# Patient Record
Sex: Male | Born: 1950 | Race: Black or African American | Hispanic: No | Marital: Married | Smoking: Current some day smoker
Health system: Southern US, Community
[De-identification: ages and names within clinical notes are randomized; demographics above are authoritative.]

## PROBLEM LIST (undated history)

## (undated) DIAGNOSIS — N281 Cyst of kidney, acquired: Secondary | ICD-10-CM

## (undated) DIAGNOSIS — Z972 Presence of dental prosthetic device (complete) (partial): Secondary | ICD-10-CM

## (undated) DIAGNOSIS — Z860101 Personal history of adenomatous and serrated colon polyps: Secondary | ICD-10-CM

## (undated) DIAGNOSIS — I10 Essential (primary) hypertension: Secondary | ICD-10-CM

## (undated) DIAGNOSIS — J45909 Unspecified asthma, uncomplicated: Secondary | ICD-10-CM

## (undated) DIAGNOSIS — K629 Disease of anus and rectum, unspecified: Secondary | ICD-10-CM

## (undated) DIAGNOSIS — F1410 Cocaine abuse, uncomplicated: Secondary | ICD-10-CM

## (undated) DIAGNOSIS — N529 Male erectile dysfunction, unspecified: Secondary | ICD-10-CM

## (undated) DIAGNOSIS — N183 Chronic kidney disease, stage 3 unspecified: Secondary | ICD-10-CM

## (undated) DIAGNOSIS — J449 Chronic obstructive pulmonary disease, unspecified: Secondary | ICD-10-CM

## (undated) DIAGNOSIS — J309 Allergic rhinitis, unspecified: Secondary | ICD-10-CM

## (undated) DIAGNOSIS — R7303 Prediabetes: Secondary | ICD-10-CM

## (undated) DIAGNOSIS — I739 Peripheral vascular disease, unspecified: Secondary | ICD-10-CM

## (undated) DIAGNOSIS — R0609 Other forms of dyspnea: Secondary | ICD-10-CM

## (undated) DIAGNOSIS — I6523 Occlusion and stenosis of bilateral carotid arteries: Secondary | ICD-10-CM

## (undated) DIAGNOSIS — Z973 Presence of spectacles and contact lenses: Secondary | ICD-10-CM

## (undated) DIAGNOSIS — K219 Gastro-esophageal reflux disease without esophagitis: Secondary | ICD-10-CM

## (undated) DIAGNOSIS — N401 Enlarged prostate with lower urinary tract symptoms: Secondary | ICD-10-CM

## (undated) DIAGNOSIS — I700 Atherosclerosis of aorta: Secondary | ICD-10-CM

## (undated) DIAGNOSIS — Z86010 Personal history of colonic polyps: Secondary | ICD-10-CM

## (undated) HISTORY — PX: NO PAST SURGERIES: SHX2092

## (undated) HISTORY — DX: Cocaine abuse, uncomplicated: F14.10

## (undated) HISTORY — DX: Occlusion and stenosis of bilateral carotid arteries: I65.23

---

## 2002-05-19 ENCOUNTER — Emergency Department (HOSPITAL_COMMUNITY): Admission: EM | Admit: 2002-05-19 | Discharge: 2002-05-19 | Payer: Self-pay | Admitting: *Deleted

## 2004-12-27 ENCOUNTER — Ambulatory Visit: Payer: Self-pay | Admitting: Internal Medicine

## 2004-12-27 ENCOUNTER — Ambulatory Visit: Payer: Self-pay | Admitting: *Deleted

## 2005-01-04 ENCOUNTER — Ambulatory Visit (HOSPITAL_COMMUNITY): Admission: RE | Admit: 2005-01-04 | Discharge: 2005-01-04 | Payer: Self-pay | Admitting: Internal Medicine

## 2005-01-19 ENCOUNTER — Ambulatory Visit: Payer: Self-pay | Admitting: Internal Medicine

## 2005-02-15 ENCOUNTER — Ambulatory Visit: Payer: Self-pay | Admitting: Cardiology

## 2005-02-27 ENCOUNTER — Ambulatory Visit: Payer: Self-pay

## 2005-03-12 ENCOUNTER — Ambulatory Visit: Payer: Self-pay | Admitting: Cardiology

## 2005-03-21 ENCOUNTER — Ambulatory Visit: Payer: Self-pay | Admitting: Internal Medicine

## 2008-01-30 ENCOUNTER — Ambulatory Visit: Payer: Self-pay | Admitting: Internal Medicine

## 2008-01-30 DIAGNOSIS — L259 Unspecified contact dermatitis, unspecified cause: Secondary | ICD-10-CM

## 2008-01-30 DIAGNOSIS — E785 Hyperlipidemia, unspecified: Secondary | ICD-10-CM | POA: Insufficient documentation

## 2008-01-30 DIAGNOSIS — L739 Follicular disorder, unspecified: Secondary | ICD-10-CM

## 2008-01-30 DIAGNOSIS — F1410 Cocaine abuse, uncomplicated: Secondary | ICD-10-CM

## 2008-01-30 DIAGNOSIS — I739 Peripheral vascular disease, unspecified: Secondary | ICD-10-CM | POA: Insufficient documentation

## 2008-01-30 DIAGNOSIS — I10 Essential (primary) hypertension: Secondary | ICD-10-CM | POA: Insufficient documentation

## 2008-01-30 DIAGNOSIS — J45909 Unspecified asthma, uncomplicated: Secondary | ICD-10-CM | POA: Insufficient documentation

## 2008-01-30 HISTORY — DX: Cocaine abuse, uncomplicated: F14.10

## 2008-01-30 LAB — CONVERTED CEMR LAB
BUN: 10 mg/dL (ref 6–23)
CO2: 23 meq/L (ref 19–32)
Calcium: 9.5 mg/dL (ref 8.4–10.5)
Chloride: 106 meq/L (ref 96–112)
Creatinine, Ser: 1.23 mg/dL (ref 0.40–1.50)
Glucose, Bld: 84 mg/dL (ref 70–99)
Potassium: 4.1 meq/L (ref 3.5–5.3)
Sodium: 140 meq/L (ref 135–145)

## 2008-02-18 ENCOUNTER — Ambulatory Visit: Payer: Self-pay | Admitting: Internal Medicine

## 2008-02-29 ENCOUNTER — Encounter (INDEPENDENT_AMBULATORY_CARE_PROVIDER_SITE_OTHER): Payer: Self-pay | Admitting: Internal Medicine

## 2008-02-29 LAB — CONVERTED CEMR LAB
ALT: 25 units/L (ref 0–53)
AST: 22 units/L (ref 0–37)
Albumin: 4.6 g/dL (ref 3.5–5.2)
Alkaline Phosphatase: 143 units/L — ABNORMAL HIGH (ref 39–117)
BUN: 8 mg/dL (ref 6–23)
CO2: 28 meq/L (ref 19–32)
Calcium: 9.5 mg/dL (ref 8.4–10.5)
Chloride: 104 meq/L (ref 96–112)
Cholesterol: 233 mg/dL — ABNORMAL HIGH (ref 0–200)
Creatinine, Ser: 1.29 mg/dL (ref 0.40–1.50)
Glucose, Bld: 87 mg/dL (ref 70–99)
HDL: 45 mg/dL (ref >39)
LDL Cholesterol: 159 mg/dL — ABNORMAL HIGH (ref 0–99)
Potassium: 3.8 meq/L (ref 3.5–5.3)
Sodium: 142 meq/L (ref 135–145)
Total Bilirubin: 0.5 mg/dL (ref 0.3–1.2)
Total CHOL/HDL Ratio: 5.2
Total Protein: 7.9 g/dL (ref 6.0–8.3)
Triglycerides: 143 mg/dL (ref <150)
VLDL: 29 mg/dL (ref 0–40)

## 2008-03-04 ENCOUNTER — Ambulatory Visit: Payer: Self-pay | Admitting: Internal Medicine

## 2008-03-04 DIAGNOSIS — K219 Gastro-esophageal reflux disease without esophagitis: Secondary | ICD-10-CM | POA: Insufficient documentation

## 2008-04-29 ENCOUNTER — Ambulatory Visit: Payer: Self-pay | Admitting: Internal Medicine

## 2008-04-29 DIAGNOSIS — L255 Unspecified contact dermatitis due to plants, except food: Secondary | ICD-10-CM

## 2008-04-29 LAB — CONVERTED CEMR LAB
ALT: 17 units/L (ref 0–53)
AST: 18 units/L (ref 0–37)
Albumin: 4 g/dL (ref 3.5–5.2)
Alkaline Phosphatase: 112 units/L (ref 39–117)
BUN: 10 mg/dL (ref 6–23)
CO2: 27 meq/L (ref 19–32)
Calcium: 9.2 mg/dL (ref 8.4–10.5)
Chloride: 105 meq/L (ref 96–112)
Cholesterol: 186 mg/dL (ref 0–200)
Creatinine, Ser: 1.33 mg/dL (ref 0.40–1.50)
Glucose, Bld: 85 mg/dL (ref 70–99)
HDL: 57 mg/dL (ref >39)
LDL Cholesterol: 109 mg/dL — ABNORMAL HIGH (ref 0–99)
Potassium: 4.1 meq/L (ref 3.5–5.3)
Sodium: 143 meq/L (ref 135–145)
Total Bilirubin: 0.5 mg/dL (ref 0.3–1.2)
Total CHOL/HDL Ratio: 3.3
Total Protein: 6.7 g/dL (ref 6.0–8.3)
Triglycerides: 101 mg/dL (ref <150)
VLDL: 20 mg/dL (ref 0–40)

## 2008-04-30 ENCOUNTER — Encounter (INDEPENDENT_AMBULATORY_CARE_PROVIDER_SITE_OTHER): Payer: Self-pay | Admitting: Internal Medicine

## 2008-06-01 ENCOUNTER — Ambulatory Visit: Payer: Self-pay | Admitting: Internal Medicine

## 2008-06-01 DIAGNOSIS — K029 Dental caries, unspecified: Secondary | ICD-10-CM | POA: Insufficient documentation

## 2008-06-01 DIAGNOSIS — J309 Allergic rhinitis, unspecified: Secondary | ICD-10-CM | POA: Insufficient documentation

## 2008-06-09 ENCOUNTER — Encounter (INDEPENDENT_AMBULATORY_CARE_PROVIDER_SITE_OTHER): Payer: Self-pay | Admitting: Internal Medicine

## 2008-07-02 ENCOUNTER — Ambulatory Visit: Payer: Self-pay | Admitting: Internal Medicine

## 2008-07-08 ENCOUNTER — Encounter (INDEPENDENT_AMBULATORY_CARE_PROVIDER_SITE_OTHER): Payer: Self-pay | Admitting: *Deleted

## 2008-07-08 LAB — CONVERTED CEMR LAB
Cholesterol: 187 mg/dL (ref 0–200)
HDL: 54 mg/dL (ref >39)
LDL Cholesterol: 108 mg/dL — ABNORMAL HIGH (ref 0–99)
Total CHOL/HDL Ratio: 3.5
Triglycerides: 125 mg/dL (ref <150)
VLDL: 25 mg/dL (ref 0–40)

## 2008-11-11 ENCOUNTER — Telehealth (INDEPENDENT_AMBULATORY_CARE_PROVIDER_SITE_OTHER): Payer: Self-pay | Admitting: Internal Medicine

## 2008-11-18 ENCOUNTER — Ambulatory Visit: Payer: Self-pay | Admitting: Internal Medicine

## 2008-12-05 LAB — CONVERTED CEMR LAB
Cholesterol: 226 mg/dL — ABNORMAL HIGH (ref 0–200)
HDL: 52 mg/dL (ref >39)
LDL Cholesterol: 149 mg/dL — ABNORMAL HIGH (ref 0–99)
Total CHOL/HDL Ratio: 4.3
Triglycerides: 125 mg/dL (ref <150)
VLDL: 25 mg/dL (ref 0–40)

## 2008-12-10 ENCOUNTER — Encounter (INDEPENDENT_AMBULATORY_CARE_PROVIDER_SITE_OTHER): Payer: Self-pay | Admitting: *Deleted

## 2009-03-15 ENCOUNTER — Telehealth (INDEPENDENT_AMBULATORY_CARE_PROVIDER_SITE_OTHER): Payer: Self-pay | Admitting: Internal Medicine

## 2012-02-12 ENCOUNTER — Ambulatory Visit (INDEPENDENT_AMBULATORY_CARE_PROVIDER_SITE_OTHER): Payer: 59 | Admitting: Family Medicine

## 2012-02-12 VITALS — BP 176/78 | HR 66 | Temp 98.1°F | Resp 16 | Ht 73.58 in | Wt 191.4 lb

## 2012-02-12 DIAGNOSIS — E639 Nutritional deficiency, unspecified: Secondary | ICD-10-CM

## 2012-02-12 DIAGNOSIS — R030 Elevated blood-pressure reading, without diagnosis of hypertension: Secondary | ICD-10-CM

## 2012-02-12 DIAGNOSIS — L309 Dermatitis, unspecified: Secondary | ICD-10-CM

## 2012-02-12 MED ORDER — TRIAMCINOLONE ACETONIDE 0.1 % EX CREA: TOPICAL_CREAM | Freq: Two times a day (BID) | CUTANEOUS | Status: AC

## 2012-02-12 MED ORDER — PREDNISONE 20 MG PO TABS: ORAL_TABLET | ORAL | Status: AC

## 2012-02-12 NOTE — Progress Notes (Signed)
  Patient Name: Andrew [name] Date of Birth: November 01, 1951 Medical Record Number: [id number] Gender: male Date of Encounter: 02/12/2012  History of Present Illness:  Andrew [name] is a 61 y.o. very pleasant male patient who presents with the following:  Here to evaluate a rash that has been present for years- seems to get worse in the winter. He has had it for a few months so far this year.  Otherwise he feels fine.  He has been treated with a cream topically in the past- it has helped some He is not aware of a history of HTN and has not been treated as far as he knows.  His wife does have a BP cuff at home that he can use Patient Active Problem List  Diagnoses  . HYPERCHOLESTEROLEMIA  . COCAINE ABUSE  . HYPERTENSION  . PERIPHERAL VASCULAR DISEASE  . ALLERGIC RHINITIS  . DENTAL CARIES  . GERD  . CONTACT DERMATITIS DUE TO POISON IVY  . ECZEMA  . FOLLICULITIS   No past medical history on file. No past surgical history on file. History  Substance Use Topics  . Smoking status: Never Smoker   . Smokeless tobacco: Not on file  . Alcohol Use: Not on file   No family history on file. No Known Allergies  Medication list has been reviewed and updated.  Review of Systems: As per HPI- otherwise negative. Rash is itchy, otherwise he feels fine  Physical Examination: Filed Vitals:   02/12/12 1346  BP: 176/78  Pulse: 66  Temp: 98.1 F (36.7 C)  TempSrc: Oral  Resp: 16  Height: 6' 1.58" (1.869 m)  Weight: 191 lb 6.4 oz (86.818 kg)    Body mass index is 24.86 kg/(m^2).  GEN: WDWN, NAD, Non-toxic, A & O x 3 HEENT: Atraumatic, Normocephalic. Neck supple. No masses, No LAD. Ears and Nose: No external deformity. CV: RRR, No M/G/R. No JVD. No thrill. No extra heart sounds. PULM: CTA B, no wheezes, crackles, rhonchi. No retractions. No resp. distress. No accessory muscle use. Excoriated rash especially on bilateral upper arms, upper back and lower legs.  Scaly, appears  consistent with contact derm or severe eczema EXTR: No c/c/e NEURO Normal gait.  PSYCH: Normally interactive. Conversant. Not depressed or anxious appearing.  Calm demeanor.    Assessment and Plan: 1. Eczema  triamcinolone cream (KENALOG) 0.1 %, predniSONE (DELTASONE) 20 MG tablet  2. Elevated BP     Treat eczema as above- cream BID as needed, and brief course of prednisone given severity of rash currently.  Asked him to keep an eye on his BP at home and if consistently >140/90 please let us know

## 2012-04-09 ENCOUNTER — Ambulatory Visit: Payer: Self-pay

## 2015-02-13 ENCOUNTER — Emergency Department (HOSPITAL_COMMUNITY): Payer: Self-pay

## 2015-02-13 ENCOUNTER — Encounter (HOSPITAL_COMMUNITY): Payer: Self-pay

## 2015-02-13 ENCOUNTER — Emergency Department (HOSPITAL_COMMUNITY)
Admission: EM | Admit: 2015-02-13 | Discharge: 2015-02-13 | Disposition: A | Discharge status: Home / Self Care | Payer: Self-pay | Attending: Emergency Medicine | Admitting: Emergency Medicine

## 2015-02-13 DIAGNOSIS — Z720 Tobacco use: Secondary | ICD-10-CM | POA: Insufficient documentation

## 2015-02-13 DIAGNOSIS — I10 Essential (primary) hypertension: Secondary | ICD-10-CM | POA: Insufficient documentation

## 2015-02-13 DIAGNOSIS — J441 Chronic obstructive pulmonary disease with (acute) exacerbation: Secondary | ICD-10-CM | POA: Insufficient documentation

## 2015-02-13 HISTORY — DX: Essential (primary) hypertension: I10

## 2015-02-13 MED ORDER — PREDNISONE 20 MG PO TABS
60.0000 mg | ORAL_TABLET | Freq: Once | ORAL | Status: AC
  Administered 2015-02-13: 60 mg via ORAL
  Filled 2015-02-13: qty 3

## 2015-02-13 MED ORDER — PREDNISONE 20 MG PO TABS: ORAL_TABLET | ORAL | Status: DC

## 2015-02-13 MED ORDER — IPRATROPIUM-ALBUTEROL 0.5-2.5 (3) MG/3ML IN SOLN
3.0000 mL | Freq: Once | RESPIRATORY_TRACT | Status: AC
  Administered 2015-02-13: 3 mL via RESPIRATORY_TRACT
  Filled 2015-02-13: qty 3

## 2015-02-13 MED ORDER — ALBUTEROL SULFATE HFA 108 (90 BASE) MCG/ACT IN AERS
2.0000 | INHALATION_SPRAY | RESPIRATORY_TRACT | Status: DC | PRN
  Administered 2015-02-13: 2 via RESPIRATORY_TRACT
  Filled 2015-02-13: qty 6.7

## 2015-02-13 NOTE — ED Notes (Signed)
Patient transported to X-ray 

## 2015-02-13 NOTE — ED Provider Notes (Signed)
CSN: [id number]     Arrival date & time 02/13/15  2003 History  This chart was scribed for non-physician practitioner, Noland [name], PA-C working with Alfonzo Beers, MD by Frederich Balding, ED scribe. This patient was seen in room TR06C/TR06C and the patient's care was started at 9:28 PM.    Chief Complaint  Patient presents with  . Cough  . URI   The history is provided by the patient. No language interpreter was used.    HPI Comments: Andrew [name] is a 64 y.o. male with history of COPD who presents to the Emergency Department complaining of productive cough of white sputum that started 2 weeks ago. He also reports rhinorrhea and nasal congestion. Pt has taken mucinex and Claritin with moderate relief. Pt states mucinex has helped thin the mucous.  Wheezing and chest tightness started earlier tonight. He states his inhalers at home are out of date so he hasn't used one. Pt denies fever, chest pain, nausea, emesis. He has never had to be hospitalized.   Past Medical History  Diagnosis Date  . Hypertension    History reviewed. No pertinent past surgical history. History reviewed. No pertinent family history. History  Substance Use Topics  . Smoking status: Current Some Day Smoker -- 0.25 packs/day    Types: Cigarettes  . Smokeless tobacco: Not on file  . Alcohol Use: Yes     Comment: rare    Review of Systems  Constitutional: Negative for fever.  HENT: Positive for congestion and rhinorrhea.   Respiratory: Positive for cough and chest tightness.   Cardiovascular: Negative for chest pain.  Gastrointestinal: Negative for nausea and vomiting.  All other systems reviewed and are negative.  Allergies  Review of patient's allergies indicates no known allergies.  Home Medications   Prior to Admission medications   Medication Sig Start Date End Date Taking? Authorizing Provider  predniSONE (DELTASONE) 20 MG tablet 2 tabs po daily x 3 days 02/13/15   Noland [name], PA-C   BP 158/79  mmHg  Pulse 75  Temp(Src) 98.2 F (36.8 C) (Oral)  Resp 20  Ht 6' 1.5" (1.867 m)  Wt 195 lb (88.451 kg)  BMI 25.38 kg/m2  SpO2 95%   Physical Exam  Constitutional: He is oriented to person, place, and time. He appears well-developed and well-nourished.  HENT:  Head: Normocephalic and atraumatic.  Eyes: EOM are normal.  Neck: Normal range of motion.  Cardiovascular: Normal rate, regular rhythm and normal heart sounds.   Pulmonary/Chest: Effort normal. He has wheezes.  Inspiratory and expiratory wheezing in lower lung fields.   Musculoskeletal: Normal range of motion.  Neurological: He is alert and oriented to person, place, and time.  Skin: Skin is warm and dry.  Psychiatric: He has a normal mood and affect. His behavior is normal.  Nursing note and vitals reviewed.   ED Course  Procedures (including critical care time)  DIAGNOSTIC STUDIES: Oxygen Saturation is 95% on RA, adequate by my interpretation.    COORDINATION OF CARE: 9:30 PM-Discussed treatment plan which includes breathing treatment, prednisone, and an inhaler with pt at bedside and pt agreed to plan.   Labs Review Labs Reviewed - No data to display  Imaging Review Dg Chest 2 View  02/13/2015   CLINICAL DATA:  Cough and shortness of breath today. Chest radiograph obtained with nipple markers.  EXAM: CHEST  2 VIEW  COMPARISON:  02/13/2015  FINDINGS: Nipple markers correspond to the nodules demonstrated on previous study consistent with prominent  nipple shadows. Heart size and pulmonary vascularity are normal. No focal airspace disease or consolidation in the lungs. Mild hyperinflation likely due to emphysema. No blunting of costophrenic angles. No pneumothorax. Mild degenerative changes in the spine.  IMPRESSION: Emphysematous changes in the lungs. No evidence of active pulmonary disease. Prominent nipple shadows confirmed with nipple markers.   Electronically Signed   By: Lucienne Capers M.D.   On: 02/13/2015 22:47    Dg Chest 2 View  02/13/2015   CLINICAL DATA:  Cough and upper respiratory infection. Symptoms for 1 week. Initial encounter.  EXAM: CHEST  2 VIEW  COMPARISON:  None.  FINDINGS: The heart size and mediastinal contours are within normal limits. Both lungs are clear. The visualized skeletal structures are unremarkable.  Moderate hyperinflation suggesting COPD.  Rounded soft tissue densities project over the mid lung zones on the RIGHT than LEFT, probable nipple shadows, although the density on the RIGHT is somewhat atypical. Recommend repeat imaging with nipple markers.  IMPRESSION: No active infiltrates or failure.  COPD.  Suspected BILATERAL prominent nipples. Recommend repeat chest radiograph with nipple markers.   Electronically Signed   By: Rolla Flatten M.D.   On: 02/13/2015 21:04     EKG Interpretation None      MDM   Final diagnoses:  COPD exacerbation   Pt with hx of COPD presenting to ED with c/o worsening SOB with wheeze. His home inhaler is out of date. Vitals: WNL.  Lungs: inspiratory and expiratory wheeze in lower lung fields. No accessory muscle use.   9:48 PM Lungs: CTAB after duoneb tx.  CXR recommends repeat with nipple markers.    CXR: no active pulmonary disease.   Discussed pt with Dr. Canary Brim. Agrees pt may be discharged home with albuterol inhaler and prednisone. Will hold off on antibiotics at time as pt is afebrile, states cough and congestion are improving and wheeze resolved with 1 neb tx.  Advised to f/u with PCP in 2-3 days for recheck of symptoms as needed. Return precautions provided. Pt verbalized understanding and agreement with tx plan.   I personally performed the services described in this documentation, which was scribed in my presence. The recorded information has been reviewed and is accurate.   Noland [name], PA-C 02/14/15 0006  Alfonzo Beers, MD 02/14/15 0010

## 2015-02-13 NOTE — Discharge Instructions (Signed)
You may use your albuterol inhaler as needed, 2 puffs every 4 hours. See below for further instructions. Be sure to follow up with your primary care provider for ongoing healthcare needs and recheck of symptoms in 3-4 days if not improving.

## 2015-02-13 NOTE — ED Notes (Signed)
Onset 2 weeks productive cough during nighttime only, runny nose, eyes burning, nasal congestion.  Onset today voice is hoarse.   Earlier tonight pt reports not being able to take good deep breath.  Pt feels much better at this time, talking in complete sentences,  No respiratory distress noted.  Pt has tried Mucinex and Claritin.  Pt has not taken BP med this weekend.

## 2015-04-29 ENCOUNTER — Ambulatory Visit (INDEPENDENT_AMBULATORY_CARE_PROVIDER_SITE_OTHER): Payer: Self-pay | Admitting: Emergency Medicine

## 2015-04-29 VITALS — BP 146/84 | HR 78 | Temp 97.7°F | Resp 17 | Ht 74.5 in | Wt 191.6 lb

## 2015-04-29 DIAGNOSIS — I10 Essential (primary) hypertension: Secondary | ICD-10-CM

## 2015-04-29 DIAGNOSIS — R51 Headache: Secondary | ICD-10-CM

## 2015-04-29 DIAGNOSIS — R519 Headache, unspecified: Secondary | ICD-10-CM

## 2015-04-29 MED ORDER — LISINOPRIL 20 MG PO TABS: 20.0000 mg | ORAL_TABLET | Freq: Every day | ORAL | Status: DC

## 2015-04-29 NOTE — Progress Notes (Signed)
Subjective:  Patient ID: Andrew [name], male    DOB: June 07, 1951  Age: 64 y.o. MRN: [id number]  CC: Medication Refill and Headache   HPI Andrew [name] presents  concerned about his blood pressure. He said that he ran out of his blood pressure medicine on Monday doesn't have a regular doctor and hasn't had any medical care last refill he awoke this morning with a headache and became alarmed and so he came to have his blood pressure medicine refilled. His headache is not relieved he had no other neurologic or visual symptoms has no other complaints. No chest pain tightness heaviness pressure shortness breath. No edema.  History Andrew [name] has a past medical history of Hypertension.   He has no past surgical history on file.   His  family history is not on file.  He   reports that he has been smoking Cigarettes.  He has been smoking about 0.25 packs per day. He does not have any smokeless tobacco history on file. He reports that he drinks alcohol. He reports that he does not use illicit drugs.  Outpatient Prescriptions Prior to Visit  Medication Sig Dispense Refill  . predniSONE (DELTASONE) 20 MG tablet 2 tabs po daily x 3 days 6 tablet 0   No facility-administered medications prior to visit.    History   Social History  . Marital Status: Legally Separated    Spouse Name: N/A  . Number of Children: N/A  . Years of Education: N/A   Social History Main Topics  . Smoking status: Current Some Day Smoker -- 0.25 packs/day    Types: Cigarettes  . Smokeless tobacco: Not on file  . Alcohol Use: Yes     Comment: rare  . Drug Use: No  . Sexual Activity: Not on file   Other Topics Concern  . None   Social History Narrative     Review of Systems  Constitutional: Negative for fever, chills and appetite change.  HENT: Negative for congestion, ear pain, postnasal drip, sinus pressure and sore throat.   Eyes: Negative for pain and redness.  Respiratory: Negative for cough,  shortness of breath and wheezing.   Cardiovascular: Negative for leg swelling.  Gastrointestinal: Negative for nausea, vomiting, abdominal pain, diarrhea, constipation and blood in stool.  Endocrine: Negative for polyuria.  Genitourinary: Negative for dysuria, urgency, frequency and flank pain.  Musculoskeletal: Negative for gait problem.  Skin: Negative for rash.  Neurological: Positive for headaches. Negative for weakness.  Psychiatric/Behavioral: Negative for confusion and decreased concentration. The patient is not nervous/anxious.     Objective:  BP 146/84 mmHg  Pulse 78  Temp(Src) 97.7 F (36.5 C) (Oral)  Resp 17  Ht 6' 2.5" (1.892 m)  Wt 191 lb 9.6 oz (86.909 kg)  BMI 24.28 kg/m2  SpO2 98%  Physical Exam    Assessment & Plan:   Andrew [name] was seen today for medication refill and headache.  Diagnoses and all orders for this visit:  Essential hypertension, benign  Acute nonintractable headache, unspecified headache type  Other orders -     lisinopril (PRINIVIL,ZESTRIL) 20 MG tablet; Take 1 tablet (20 mg total) by mouth daily.   I have discontinued Andrew [name] predniSONE. I have also changed his lisinopril.  Meds ordered this encounter  Medications  . DISCONTD: lisinopril (PRINIVIL,ZESTRIL) 20 MG tablet    Sig: Take 20 mg by mouth daily.  Marland Kitchen lisinopril (PRINIVIL,ZESTRIL) 20 MG tablet    Sig: Take 1 tablet (20 mg total) by  mouth daily.    Dispense:  90 tablet    Refill:  1   He was reminded not to run out of his medicines future and follow-up in 6 months for repeat for blood work   Appropriate red flag conditions were discussed with the patient as well as actions that should be taken.  Patient expressed his understanding.  Follow-up: Return in about 6 months (around 10/29/2015).  Roselee Culver, MD

## 2015-04-29 NOTE — Patient Instructions (Signed)

## 2015-11-14 ENCOUNTER — Ambulatory Visit (INDEPENDENT_AMBULATORY_CARE_PROVIDER_SITE_OTHER): Payer: Self-pay | Admitting: Physician Assistant

## 2015-11-14 ENCOUNTER — Encounter: Payer: Self-pay | Admitting: Physician Assistant

## 2015-11-14 VITALS — BP 167/83 | HR 80 | Temp 97.5°F | Resp 18 | Ht 74.5 in | Wt 194.0 lb

## 2015-11-14 DIAGNOSIS — L309 Dermatitis, unspecified: Secondary | ICD-10-CM

## 2015-11-14 DIAGNOSIS — I10 Essential (primary) hypertension: Secondary | ICD-10-CM

## 2015-11-14 LAB — POC MICROSCOPIC URINALYSIS (UMFC): MUCUS RE: ABSENT

## 2015-11-14 LAB — POCT URINALYSIS DIP (MANUAL ENTRY)
Bilirubin, UA: NEGATIVE
GLUCOSE UA: NEGATIVE
Ketones, POC UA: NEGATIVE
LEUKOCYTES UA: NEGATIVE
Nitrite, UA: NEGATIVE
Protein Ur, POC: NEGATIVE
Urobilinogen, UA: 0.2
pH, UA: 5

## 2015-11-14 MED ORDER — LISINOPRIL 20 MG PO TABS: 20.0000 mg | ORAL_TABLET | Freq: Every day | ORAL | Status: DC

## 2015-11-14 MED ORDER — TRIAMCINOLONE 0.1 % CREAM:EUCERIN CREAM 1:1: 1.0000 "application " | TOPICAL_CREAM | Freq: Two times a day (BID) | CUTANEOUS | Status: DC | PRN

## 2015-11-14 MED ORDER — TRIAMCINOLONE ACETONIDE 0.1 % EX OINT: 1.0000 "application " | TOPICAL_OINTMENT | Freq: Two times a day (BID) | CUTANEOUS | Status: DC

## 2015-11-14 NOTE — Progress Notes (Signed)
[facility] [address] [place] [address] [phone]  Date:  11/14/2015   Name:  Andrew [name]   DOB:  Jul 06, 1951   MRN:  [id number]  PCP:  Mack Hook, MD    History of Present Illness:  Andrew [name] is a 65 y.o. male patient who presents to [facility] for medication refill.     4 days been without blood pressure medicaiton.  He is not watching food intake.  Eating pork products.  Sea salt.  He is eating some canned soup, and rarely eats frozen dinners.  He is eating more baking.   Beer intake 3 days per week.  Eats a lot of potato chips and popcorn .  Drinks coffee and donuts.  Exercise: walking a lot at work.  Bowling.   No chest pains or palpitaitons. He also reports that he would like refill of eczema medication.  Skin is dry and pruritic.  He is not moisturizing directly after showering.    Patient Active Problem List   Diagnosis Date Noted  . ALLERGIC RHINITIS 06/01/2008  . DENTAL CARIES 06/01/2008  . CONTACT DERMATITIS DUE TO POISON IVY 04/29/2008  . GERD 03/04/2008  . HYPERCHOLESTEROLEMIA 01/30/2008  . COCAINE ABUSE 01/30/2008  . HYPERTENSION 01/30/2008  . PERIPHERAL VASCULAR DISEASE 01/30/2008  . ECZEMA 01/30/2008  . FOLLICULITIS [id number]    Past Medical History  Diagnosis Date  . Hypertension     No past surgical history on file.  Social History  Substance Use Topics  . Smoking status: Current Some Day Smoker -- 0.25 packs/day    Types: Cigarettes  . Smokeless tobacco: None  . Alcohol Use: Yes     Comment: rare    No family history on file.  No Known Allergies  Medication list has been reviewed and updated.  Current Outpatient Prescriptions on File Prior to Visit  Medication Sig Dispense Refill  . lisinopril (PRINIVIL,ZESTRIL) 20 MG tablet Take 1 tablet (20 mg total) by mouth daily. 90 tablet 1   No current facility-administered medications on file prior to visit.    ROS ROS otherwise unremarkable unless  listed above.  Physical Examination: BP 167/83 mmHg  Pulse 80  Temp(Src) 97.5 F (36.4 C) (Oral)  Resp 18  Ht 6' 2.5" (1.892 m)  Wt 194 lb (87.998 kg)  BMI 24.58 kg/m2  SpO2 97% Ideal Body Weight: Weight in (lb) to have BMI = 25: 196.9 Wt Readings from Last 3 Encounters:  11/14/15 194 lb (87.998 kg)  04/29/15 191 lb 9.6 oz (86.909 kg)  02/13/15 195 lb (88.451 kg)    Physical Exam  Constitutional: He is oriented to person, place, and time. He appears well-developed and well-nourished. No distress.  HENT:  Head: Normocephalic and atraumatic.  Eyes: Conjunctivae and EOM are normal. Pupils are equal, round, and reactive to light.  Cardiovascular: Normal rate and regular rhythm.  Exam reveals no gallop and no friction rub.   No murmur heard. Pulses:      Carotid pulses are 2+ on the right side, and 2+ on the left side.      Radial pulses are 2+ on the right side, and 2+ on the left side.       Popliteal pulses are 2+ on the right side, and 2+ on the left side.  Pulmonary/Chest: Effort normal. No respiratory distress. He has no wheezes.  Neurological: He is alert and oriented to person, place, and time.  Skin: Skin is warm and dry. He  is not diaphoretic.  Dry at the extensor portions of upper extremity with papules, and open lesions with dryness.  Psychiatric: He has a normal mood and affect. His behavior is normal.     Assessment and Plan: Andrew [name] is a 65 y.o. male who is here today for medication refill, and skin dryness.  Advised to eczema skin care.  Applying steroid cream no longer than 2 weeks at a time.  Advised to get eucerin or cetaphil as well.  Essential hypertension - Plan: POCT urinalysis dipstick, lisinopril (PRINIVIL,ZESTRIL) 20 MG tablet, POCT Microscopic Urinalysis ([facility])  Eczema - Plan: triamcinolone ointment (KENALOG) 0.1 %, Triamcinolone Acetonide (TRIAMCINOLONE 0.1 % CREAM : EUCERIN) CREA   Ivar Drape, PA-C [facility] 11/14/2015 4:34 PM    .

## 2015-11-14 NOTE — Patient Instructions (Signed)
Please apply the ointment to these problem areas for no more than 2 weeks. You can place the combination cream of the steroid cream/lotion to the more dry areas, but then you need to start using cetaphil or eucerin twice per day.  Please read over these tips, of how to care for your skin to keep it moisturized. Please take your blood pressure medication daily.  Follow these anti-hypertension diet tips.     Eczema Eczema, also called atopic dermatitis, is a skin disorder that causes inflammation of the skin. It causes a red rash and dry, scaly skin. The skin becomes very itchy. Eczema is generally worse during the cooler winter months and often improves with the warmth of summer. Eczema usually starts showing signs in infancy. Some children outgrow eczema, but it may last through adulthood.  CAUSES  The exact cause of eczema is not known, but it appears to run in families. People with eczema often have a family history of eczema, allergies, asthma, or hay fever. Eczema is not contagious. Flare-ups of the condition may be caused by:   Contact with something you are sensitive or allergic to.   Stress. SIGNS AND SYMPTOMS  Dry, scaly skin.   Red, itchy rash.   Itchiness. This may occur before the skin rash and may be very intense.  DIAGNOSIS  The diagnosis of eczema is usually made based on symptoms and medical history. TREATMENT  Eczema cannot be cured, but symptoms usually can be controlled with treatment and other strategies. A treatment plan might include:  Controlling the itching and scratching.   Use over-the-counter antihistamines as directed for itching. This is especially useful at night when the itching tends to be worse.   Use over-the-counter steroid creams as directed for itching.   Avoid scratching. Scratching makes the rash and itching worse. It may also result in a skin infection (impetigo) due to a break in the skin caused by scratching.   Keeping the skin well  moisturized with creams every day. This will seal in moisture and help prevent dryness. Lotions that contain alcohol and water should be avoided because they can dry the skin.   Limiting exposure to things that you are sensitive or allergic to (allergens).   Recognizing situations that cause stress.   Developing a plan to manage stress.  HOME CARE INSTRUCTIONS   Only take over-the-counter or prescription medicines as directed by your health care provider.   Do not use anything on the skin without checking with your health care provider.   Keep baths or showers short (5 minutes) in warm (not hot) water. Use mild cleansers for bathing. These should be unscented. You may add nonperfumed bath oil to the bath water. It is best to avoid soap and bubble bath.   Immediately after a bath or shower, when the skin is still damp, apply a moisturizing ointment to the entire body. This ointment should be a petroleum ointment. This will seal in moisture and help prevent dryness. The thicker the ointment, the better. These should be unscented.   Keep fingernails cut short. Children with eczema may need to wear soft gloves or mittens at night after applying an ointment.   Dress in clothes made of cotton or cotton blends. Dress lightly, because heat increases itching.   A child with eczema should stay away from anyone with fever blisters or cold sores. The virus that causes fever blisters (herpes simplex) can cause a serious skin infection in children with eczema. [place]  IF:   Your itching interferes with sleep.   Your rash gets worse or is not better within 1 week after starting treatment.   You see pus or soft yellow scabs in the rash area.   You have a fever.   You have a rash flare-up after contact with someone who has fever blisters.    This information is not intended to replace advice given to you by your health care provider. Make sure you discuss any questions you  have with your health care provider.   Document Released: 10/26/2000 Document Revised: 08/19/2013 Document Reviewed: 06/01/2013 Elsevier Interactive Patient Education 2016 [place] DASH stands for "Dietary Approaches to Stop Hypertension." The DASH eating plan is a healthy eating plan that has been shown to reduce high blood pressure (hypertension). Additional health benefits may include reducing the risk of type 2 diabetes mellitus, heart disease, and stroke. The DASH eating plan may also help with weight loss. WHAT DO I NEED TO KNOW ABOUT THE DASH EATING PLAN? For the DASH eating plan, you will follow these general guidelines:  Choose foods with a percent daily value for sodium of less than 5% (as listed on the food label).  Use salt-free seasonings or herbs instead of table salt or sea salt.  Check with your health care provider or pharmacist before using salt substitutes.  Eat lower-sodium products, often labeled as "lower sodium" or "no salt added."  Eat fresh foods.  Eat more vegetables, fruits, and low-fat dairy products.  Choose whole grains. Look for the word "whole" as the first word in the ingredient list.  Choose fish and skinless chicken or [place] more often than red meat. Limit fish, poultry, and meat to 6 oz (170 g) each day.  Limit sweets, desserts, sugars, and sugary drinks.  Choose heart-healthy fats.  Limit cheese to 1 oz (28 g) per day.  Eat more home-cooked food and less restaurant, buffet, and fast food.  Limit fried foods.  Cook foods using methods other than frying.  Limit canned vegetables. If you do use them, rinse them well to decrease the sodium.  When eating at a restaurant, ask that your food be prepared with less salt, or no salt if possible. WHAT FOODS CAN I EAT? Seek help from a dietitian for individual calorie needs. Grains Whole grain or whole wheat bread. Brown rice. Whole grain or whole wheat pasta. Quinoa,  bulgur, and whole grain cereals. Low-sodium cereals. Corn or whole wheat flour tortillas. Whole grain cornbread. Whole grain crackers. Low-sodium crackers. Vegetables Fresh or frozen vegetables (raw, steamed, roasted, or grilled). Low-sodium or reduced-sodium tomato and vegetable juices. Low-sodium or reduced-sodium tomato sauce and paste. Low-sodium or reduced-sodium canned vegetables.  Fruits All fresh, canned (in natural juice), or frozen fruits. Meat and Other Protein Products Ground beef (85% or leaner), grass-fed beef, or beef trimmed of fat. Skinless chicken or [place]. Ground chicken or [place]. Pork trimmed of fat. All fish and seafood. Eggs. Dried beans, peas, or lentils. Unsalted nuts and seeds. Unsalted canned beans. Dairy Low-fat dairy products, such as skim or 1% milk, 2% or reduced-fat cheeses, low-fat ricotta or cottage cheese, or plain low-fat yogurt. Low-sodium or reduced-sodium cheeses. Fats and Oils Tub margarines without trans fats. Light or reduced-fat mayonnaise and salad dressings (reduced sodium). Avocado. Safflower, olive, or canola oils. Natural peanut or almond butter. Other Unsalted popcorn and pretzels. The items listed above may not be a complete list of recommended foods or beverages. Contact  your dietitian for more options. WHAT FOODS ARE NOT RECOMMENDED? Grains White bread. White pasta. White rice. Refined cornbread. Bagels and croissants. Crackers that contain trans fat. Vegetables Creamed or fried vegetables. Vegetables in a cheese sauce. Regular canned vegetables. Regular canned tomato sauce and paste. Regular tomato and vegetable juices. Fruits Dried fruits. Canned fruit in light or heavy syrup. Fruit juice. Meat and Other Protein Products Fatty cuts of meat. Ribs, chicken wings, bacon, sausage, bologna, salami, chitterlings, fatback, hot dogs, bratwurst, and packaged luncheon meats. Salted nuts and seeds. Canned beans with salt. Dairy Whole or 2% milk,  cream, half-and-half, and cream cheese. Whole-fat or sweetened yogurt. Full-fat cheeses or blue cheese. Nondairy creamers and whipped toppings. Processed cheese, cheese spreads, or cheese curds. Condiments Onion and garlic salt, seasoned salt, table salt, and sea salt. Canned and packaged gravies. Worcestershire sauce. Tartar sauce. Barbecue sauce. Teriyaki sauce. Soy sauce, including reduced sodium. Steak sauce. Fish sauce. Oyster sauce. Cocktail sauce. Horseradish. Ketchup and mustard. Meat flavorings and tenderizers. Bouillon cubes. Hot sauce. Tabasco sauce. Marinades. Taco seasonings. Relishes. Fats and Oils Butter, stick margarine, lard, shortening, ghee, and bacon fat. Coconut, palm kernel, or palm oils. Regular salad dressings. Other Pickles and olives. Salted popcorn and pretzels. The items listed above may not be a complete list of foods and beverages to avoid. Contact your dietitian for more information. WHERE CAN I FIND MORE INFORMATION? National Heart, Lung, and Blood Institute: travelstabloid.com   This information is not intended to replace advice given to you by your health care provider. Make sure you discuss any questions you have with your health care provider.   Document Released: 10/18/2011 Document Revised: 11/19/2014 Document Reviewed: 09/02/2013 Elsevier Interactive Patient Education [employer].

## 2016-01-13 ENCOUNTER — Ambulatory Visit (INDEPENDENT_AMBULATORY_CARE_PROVIDER_SITE_OTHER): Payer: Commercial Managed Care - HMO | Admitting: Emergency Medicine

## 2016-01-13 VITALS — BP 174/94 | HR 80 | Temp 97.9°F | Resp 16 | Ht 74.25 in | Wt 191.2 lb

## 2016-01-13 DIAGNOSIS — J301 Allergic rhinitis due to pollen: Secondary | ICD-10-CM

## 2016-01-13 DIAGNOSIS — R062 Wheezing: Secondary | ICD-10-CM

## 2016-01-13 DIAGNOSIS — R059 Cough, unspecified: Secondary | ICD-10-CM

## 2016-01-13 DIAGNOSIS — I10 Essential (primary) hypertension: Secondary | ICD-10-CM

## 2016-01-13 DIAGNOSIS — R05 Cough: Secondary | ICD-10-CM | POA: Diagnosis not present

## 2016-01-13 LAB — POCT CBC
Granulocyte percent: 66.1 %G (ref 37–80)
HCT, POC: 45.7 % (ref 43.5–53.7)
HEMOGLOBIN: 16.2 g/dL (ref 14.1–18.1)
Lymph, poc: 2.1 (ref 0.6–3.4)
MCH: 29.5 pg (ref 27–31.2)
MCHC: 35.4 g/dL (ref 31.8–35.4)
MCV: 83.4 fL (ref 80–97)
MID (cbc): 0.7 (ref 0–0.9)
MPV: 7.2 fL (ref 0–99.8)
POC Granulocyte: 5.4 (ref 2–6.9)
POC LYMPH PERCENT: 25.9 %L (ref 10–50)
POC MID %: 8 %M (ref 0–12)
Platelet Count, POC: 215 10*3/uL (ref 142–424)
RBC: 5.47 M/uL (ref 4.69–6.13)
RDW, POC: 13.6 %
WBC: 8.2 10*3/uL (ref 4.6–10.2)

## 2016-01-13 LAB — BASIC METABOLIC PANEL WITH GFR
BUN: 10 mg/dL (ref 7–25)
CO2: 29 mmol/L (ref 20–31)
Calcium: 9.6 mg/dL (ref 8.6–10.3)
Chloride: 102 mmol/L (ref 98–110)
Creat: 1.14 mg/dL (ref 0.70–1.25)
GFR, EST AFRICAN AMERICAN: 78 mL/min (ref >=60)
GFR, EST NON AFRICAN AMERICAN: 68 mL/min (ref >=60)
Glucose, Bld: 71 mg/dL (ref 65–99)
POTASSIUM: 4.6 mmol/L (ref 3.5–5.3)
SODIUM: 139 mmol/L (ref 135–146)

## 2016-01-13 MED ORDER — ALBUTEROL SULFATE HFA 108 (90 BASE) MCG/ACT IN AERS: 2.0000 | INHALATION_SPRAY | RESPIRATORY_TRACT | Status: DC | PRN

## 2016-01-13 MED ORDER — ALBUTEROL SULFATE (2.5 MG/3ML) 0.083% IN NEBU
2.5000 mg | INHALATION_SOLUTION | Freq: Once | RESPIRATORY_TRACT | Status: AC
  Administered 2016-01-13: 2.5 mg via RESPIRATORY_TRACT

## 2016-01-13 MED ORDER — LOSARTAN POTASSIUM-HCTZ 50-12.5 MG PO TABS: 1.0000 | ORAL_TABLET | Freq: Every day | ORAL | Status: DC

## 2016-01-13 MED ORDER — IPRATROPIUM BROMIDE 0.02 % IN SOLN
0.5000 mg | Freq: Once | RESPIRATORY_TRACT | Status: AC
  Administered 2016-01-13: 0.5 mg via RESPIRATORY_TRACT

## 2016-01-13 MED ORDER — CETIRIZINE HCL 10 MG PO TABS: 10.0000 mg | ORAL_TABLET | Freq: Every day | ORAL | Status: DC

## 2016-01-13 NOTE — Patient Instructions (Signed)
Asthma, Adult Asthma is a recurring condition in which the airways tighten and narrow. Asthma can make it difficult to breathe. It can cause coughing, wheezing, and shortness of breath. Asthma episodes, also called asthma attacks, range from minor to life-threatening. Asthma cannot be cured, but medicines and lifestyle changes can help control it. CAUSES Asthma is believed to be caused by inherited (genetic) and environmental factors, but its exact cause is unknown. Asthma may be triggered by allergens, lung infections, or irritants in the air. Asthma triggers are different for each person. Common triggers include:   Animal dander.  Dust mites.  Cockroaches.  Pollen from trees or grass.  Mold.  Smoke.  Air pollutants such as dust, household cleaners, hair sprays, aerosol sprays, paint fumes, strong chemicals, or strong odors.  Cold air, weather changes, and winds (which increase molds and pollens in the air).  Strong emotional expressions such as crying or laughing hard.  Stress.  Certain medicines (such as aspirin) or types of drugs (such as beta-blockers).  Sulfites in foods and drinks. Foods and drinks that may contain sulfites include dried fruit, potato chips, and sparkling grape juice.  Infections or inflammatory conditions such as the flu, a cold, or an inflammation of the nasal membranes (rhinitis).  Gastroesophageal reflux disease (GERD).  Exercise or strenuous activity. SYMPTOMS Symptoms may occur immediately after asthma is triggered or many hours later. Symptoms include:  Wheezing.  Excessive nighttime or early morning coughing.  Frequent or severe coughing with a common cold.  Chest tightness.  Shortness of breath. DIAGNOSIS  The diagnosis of asthma is made by a review of your medical history and a physical exam. Tests may also be performed. These may include:  Lung function studies. These tests show how much air you breathe in and out.  Allergy  tests.  Imaging tests such as X-rays. TREATMENT  Asthma cannot be cured, but it can usually be controlled. Treatment involves identifying and avoiding your asthma triggers. It also involves medicines. There are 2 classes of medicine used for asthma treatment:   Controller medicines. These prevent asthma symptoms from occurring. They are usually taken every day.  Reliever or rescue medicines. These quickly relieve asthma symptoms. They are used as needed and provide short-term relief. Your health care provider will help you create an asthma action plan. An asthma action plan is a written plan for managing and treating your asthma attacks. It includes a list of your asthma triggers and how they may be avoided. It also includes information on when medicines should be taken and when their dosage should be changed. An action plan may also involve the use of a device called a peak flow meter. A peak flow meter measures how well the lungs are working. It helps you monitor your condition. HOME CARE INSTRUCTIONS   Take medicines only as directed by your health care provider. Speak with your health care provider if you have questions about how or when to take the medicines.  Use a peak flow meter as directed by your health care provider. Record and keep track of readings.  Understand and use the action plan to help minimize or stop an asthma attack without needing to seek medical care.  Control your home environment in the following ways to help prevent asthma attacks:  Do not smoke. Avoid being exposed to secondhand smoke.  Change your heating and air conditioning filter regularly.  Limit your use of fireplaces and wood stoves.  Get rid of pests (such as roaches   and mice) and their droppings.  Throw away plants if you see mold on them.  Clean your floors and dust regularly. Use unscented cleaning products.  Try to have someone else vacuum for you regularly. Stay out of rooms while they are  being vacuumed and for a short while afterward. If you vacuum, use a dust mask from a hardware store, a double-layered or microfilter vacuum cleaner bag, or a vacuum cleaner with a HEPA filter.  Replace carpet with wood, tile, or vinyl flooring. Carpet can trap dander and dust.  Use allergy-proof pillows, mattress covers, and box spring covers.  Wash bed sheets and blankets every week in hot water and dry them in a dryer.  Use blankets that are made of polyester or cotton.  Clean bathrooms and kitchens with bleach. If possible, have someone repaint the walls in these rooms with mold-resistant paint. Keep out of the rooms that are being cleaned and painted.  Wash hands frequently. SEEK MEDICAL CARE IF:   You have wheezing, shortness of breath, or a cough even if taking medicine to prevent attacks.  The colored mucus you cough up (sputum) is thicker than usual.  Your sputum changes from clear or white to yellow, green, gray, or bloody.  You have any problems that may be related to the medicines you are taking (such as a rash, itching, swelling, or trouble breathing).  You are using a reliever medicine more than 2-3 times per week.  Your peak flow is still at 50-79% of your personal best after following your action plan for 1 hour.  You have a fever. SEEK IMMEDIATE MEDICAL CARE IF:   You seem to be getting worse and are unresponsive to treatment during an asthma attack.  You are short of breath even at rest.  You get short of breath when doing very little physical activity.  You have difficulty eating, drinking, or talking due to asthma symptoms.  You develop chest pain.  You develop a fast heartbeat.  You have a bluish color to your lips or fingernails.  You are light-headed, dizzy, or faint.  Your peak flow is less than 50% of your personal best.   This information is not intended to replace advice given to you by your health care provider. Make sure you discuss any  questions you have with your health care provider.   Document Released: 10/29/2005 Document Revised: 07/20/2015 Document Reviewed: 05/28/2013 Elsevier Interactive Patient Education 2016 [employer]. Allergic Rhinitis Allergic rhinitis is when the mucous membranes in the nose respond to allergens. Allergens are particles in the air that cause your body to have an allergic reaction. This causes you to release allergic antibodies. Through a chain of events, these eventually cause you to release histamine into the blood stream. Although meant to protect the body, it is this release of histamine that causes your discomfort, such as frequent sneezing, congestion, and an itchy, runny nose.  CAUSES Seasonal allergic rhinitis (hay fever) is caused by pollen allergens that may come from grasses, trees, and weeds. Year-round allergic rhinitis (perennial allergic rhinitis) is caused by allergens such as house dust mites, pet dander, and mold spores. SYMPTOMS  Nasal stuffiness (congestion).  Itchy, runny nose with sneezing and tearing of the eyes. DIAGNOSIS Your health care provider can help you determine the allergen or allergens that trigger your symptoms. If you and your health care provider are unable to determine the allergen, skin or blood testing may be used. Your health care provider will diagnose your  condition after taking your health history and performing a physical exam. Your health care provider may assess you for other related conditions, such as asthma, pink eye, or an ear infection. TREATMENT Allergic rhinitis does not have a cure, but it can be controlled by:  Medicines that block allergy symptoms. These may include allergy shots, nasal sprays, and oral antihistamines.  Avoiding the allergen. Hay fever may often be treated with antihistamines in pill or nasal spray forms. Antihistamines block the effects of histamine. There are over-the-counter medicines that may help with nasal  congestion and swelling around the eyes. Check with your health care provider before taking or giving this medicine. If avoiding the allergen or the medicine prescribed do not work, there are many new medicines your health care provider can prescribe. Stronger medicine may be used if initial measures are ineffective. Desensitizing injections can be used if medicine and avoidance does not work. Desensitization is when a patient is given ongoing shots until the body becomes less sensitive to the allergen. Make sure you follow up with your health care provider if problems continue. HOME CARE INSTRUCTIONS It is not possible to completely avoid allergens, but you can reduce your symptoms by taking steps to limit your exposure to them. It helps to know exactly what you are allergic to so that you can avoid your specific triggers. SEEK MEDICAL CARE IF:  You have a fever.  You develop a cough that does not stop easily (persistent).  You have shortness of breath.  You start wheezing.  Symptoms interfere with normal daily activities.   This information is not intended to replace advice given to you by your health care provider. Make sure you discuss any questions you have with your health care provider.   Document Released: 07/24/2001 Document Revised: 11/19/2014 Document Reviewed: 07/06/2013 Elsevier Interactive Patient Education [employer].

## 2016-01-13 NOTE — Progress Notes (Addendum)
By signing my name below, I, [name], attest that this documentation has been prepared under the direction and in the presence of Andrew Queen, MD.  Electronically Signed: [name], ED Scribe. 01/13/2016. 10:38 AM.   Chief Complaint:  Chief Complaint  Patient presents with  . Wheezing    x 1 week  . Cough    Productive  . Hypertension    Pt states he thinks he is having a side effect of coughing from the lisinopril  . Flu Vaccine    HPI: Andrew [name] is a 65 y.o. male who reports to [facility] today complaining of wheezing with associated cough. Pt states symptoms started with wheezing and trouble breathing a couple days ago due to warm weather and pollen. He denies hx of asthma. He states during the spring he typically develops similar symptoms. He has used an inhaler in the past. Pt denies drug allergies.   Pt's BP is elevated at 178/84. He takes lisinopril daily and has been on this medication for 1 year.   Past Medical History  Diagnosis Date  . Hypertension    History reviewed. No pertinent past surgical history. Social History   Social History  . Marital Status: Legally Separated    Spouse Name: N/A  . Number of Children: N/A  . Years of Education: N/A   Social History Main Topics  . Smoking status: Current Some Day Smoker -- 0.25 packs/day    Types: Cigarettes  . Smokeless tobacco: None  . Alcohol Use: Yes     Comment: rare  . Drug Use: No  . Sexual Activity: Not Asked   Other Topics Concern  . None   Social History Narrative   History reviewed. No pertinent family history. No Known Allergies Prior to Admission medications   Medication Sig Start Date End Date Taking? Authorizing Provider  lisinopril (PRINIVIL,ZESTRIL) 20 MG tablet Take 1 tablet (20 mg total) by mouth daily. 11/14/15  Yes Stephanie D [name], PA  Omeprazole Magnesium (PRILOSEC OTC PO) Take by mouth.   Yes Historical Provider, MD  Triamcinolone Acetonide (TRIAMCINOLONE 0.1 % CREAM :  EUCERIN) CREA Apply 1 application topically 2 (two) times daily as needed. Patient not taking: Reported on 01/13/2016 11/14/15   Andrew Heckle [name], PA  triamcinolone ointment (KENALOG) 0.1 % Apply 1 application topically 2 (two) times daily. Patient not taking: Reported on 01/13/2016 11/14/15   Andrew Heckle [name], PA     ROS: The patient denies fevers, chills, night sweats, unintentional weight loss, chest pain, palpitations, dyspnea on exertion, nausea, vomiting, abdominal pain, dysuria, hematuria, melena, numbness, weakness, or tingling.   All other systems have been reviewed and were otherwise negative with the exception of those mentioned in the HPI and as above.    PHYSICAL EXAM: Filed Vitals:   01/13/16 1001 01/13/16 1140  BP: 178/84 174/94  Pulse: 80   Temp: 97.9 F (36.6 C)   Resp: 16    Body mass index is 24.38 kg/(m^2).  BP recheck: 160/100  General: Alert, no acute distress HEENT:  Normocephalic, atraumatic, oropharynx patent. Nasal congestion.  Eye: Andrew [name] [facility] Cardiovascular:  Regular rate and rhythm, no rubs murmurs or gallops.  No Carotid bruits, radial pulse intact. No pedal edema.  Respiratory: Clear to auscultation bilaterally.  No  rales, or rhonchi.  No cyanosis, no use of accessory musculature. Bilateral occasional wheezes. Good breath sounds.  Abdominal: No organomegaly, abdomen is soft and non-tender, positive bowel sounds.  No masses. Musculoskeletal: Gait intact.  No edema, tenderness Skin: No rashes. Neurologic: Facial musculature symmetric. Psychiatric: Patient acts appropriately throughout our interaction. Lymphatic: No cervical or submandibular lymphadenopathy  Results for orders placed or performed in visit on 01/13/16  POCT CBC  Result Value Ref Range   WBC 8.2 4.6 - 10.2 K/uL   Lymph, poc 2.1 0.6 - 3.4   POC LYMPH PERCENT 25.9 10 - 50 %L   MID (cbc) 0.7 0 - 0.9   POC MID % 8.0 0 - 12 %M   POC Granulocyte 5.4 2 - 6.9   Granulocyte percent 66.1  37 - 80 %G   RBC 5.47 4.69 - 6.13 M/uL   Hemoglobin 16.2 14.1 - 18.1 g/dL   HCT, POC 45.7 43.5 - 53.7 %   MCV 83.4 80 - 97 fL   MCH, POC 29.5 27 - 31.2 pg   MCHC 35.4 31.8 - 35.4 g/dL   RDW, POC 13.6 %   Platelet Count, POC 215 142 - 424 K/uL   MPV 7.2 0 - 99.8 fL   Office Spirometry Results: Peak Flow: 400 L/MIN (post treatment)  ASSESSMENT/PLAN: Computer patient improved with nebulizer treatment. Blood pressure not under control on lisinopril will change to losartan HCTZ. He was also given an albuterol inhaler and advised to take Zyrtec daily.I personally performed the services described in this documentation, which was scribed in my presence. The recorded information has been reviewed and is accurate. Patient denies any chest pain or exertional chest discomfort. His symptoms have L been cough and wheezing. Some of this could be from the lisinopril. This was stopped and he will hopefully will feel better on the losartan HCTZ. He will monitor his pressure at work and has an appointment to see Colletta [place] [name] in 2 weeks. Post treatment peak flow was 400.   Gross sideeffects, risk and benefits, and alternatives of medications d/w patient. Patient is aware that all medications have potential sideeffects and we are unable to predict every sideeffect or drug-drug interaction that may occur.  Andrew Queen MD 01/13/2016 10:38 AM

## 2016-01-14 ENCOUNTER — Encounter: Payer: Self-pay | Admitting: *Deleted

## 2016-01-24 ENCOUNTER — Encounter: Payer: Self-pay | Admitting: Physician Assistant

## 2016-01-24 ENCOUNTER — Ambulatory Visit (INDEPENDENT_AMBULATORY_CARE_PROVIDER_SITE_OTHER): Payer: Commercial Managed Care - HMO | Admitting: Physician Assistant

## 2016-01-24 VITALS — BP 121/75 | HR 85 | Temp 97.5°F | Resp 16 | Ht 74.0 in | Wt 190.0 lb

## 2016-01-24 DIAGNOSIS — R062 Wheezing: Secondary | ICD-10-CM | POA: Diagnosis not present

## 2016-01-24 DIAGNOSIS — Z7189 Other specified counseling: Secondary | ICD-10-CM | POA: Diagnosis not present

## 2016-01-24 DIAGNOSIS — Z0000 Encounter for general adult medical examination without abnormal findings: Secondary | ICD-10-CM

## 2016-01-24 DIAGNOSIS — Z23 Encounter for immunization: Secondary | ICD-10-CM | POA: Diagnosis not present

## 2016-01-24 DIAGNOSIS — Z1159 Encounter for screening for other viral diseases: Secondary | ICD-10-CM | POA: Diagnosis not present

## 2016-01-24 DIAGNOSIS — R0689 Other abnormalities of breathing: Secondary | ICD-10-CM

## 2016-01-24 DIAGNOSIS — Z1211 Encounter for screening for malignant neoplasm of colon: Secondary | ICD-10-CM

## 2016-01-24 LAB — HEPATIC FUNCTION PANEL
ALT: 17 U/L (ref 9–46)
AST: 17 U/L (ref 10–35)
Albumin: 4.4 g/dL (ref 3.6–5.1)
Alkaline Phosphatase: 147 U/L — ABNORMAL HIGH (ref 40–115)
Bilirubin, Direct: 0.1 mg/dL
Indirect Bilirubin: 0.4 mg/dL (ref 0.2–1.2)
Total Bilirubin: 0.5 mg/dL (ref 0.2–1.2)
Total Protein: 7.6 g/dL (ref 6.1–8.1)

## 2016-01-24 LAB — LIPID PANEL
Cholesterol: 253 mg/dL — ABNORMAL HIGH (ref 125–200)
HDL: 51 mg/dL
LDL Cholesterol: 179 mg/dL — ABNORMAL HIGH
Total CHOL/HDL Ratio: 5 ratio
Triglycerides: 116 mg/dL
VLDL: 23 mg/dL

## 2016-01-24 MED ORDER — ZOSTER VACCINE LIVE 19400 UNT/0.65ML ~~LOC~~ SOLR: 0.6500 mL | Freq: Once | SUBCUTANEOUS | Status: DC

## 2016-01-24 MED ORDER — BUDESONIDE-FORMOTEROL FUMARATE 160-4.5 MCG/ACT IN AERO: 2.0000 | INHALATION_SPRAY | Freq: Two times a day (BID) | RESPIRATORY_TRACT | Status: DC

## 2016-01-24 NOTE — Progress Notes (Addendum)
[facility] [address] [place] [address] [phone]  Date:  01/24/2016   Name:  Andrew [name]   DOB:  1951-05-17   MRN:  [id number]  PCP:  Mack Hook, MD    History of Present Illness:  Andrew [name] is a 65 y.o. male patient who presents to [facility] for medicare welcome exam.    Diet: grits, bacon-toast. Will eat fastfood like bbking or mcdonalds.  Break will eat pastry and coffee.  Lunch: takes cold sandwiches, or leftovers.  Dinner: she has salads and vegetables, and turnip greens.  Green peas and mixed vegetables.    BM: Normal.  No constipation, diarrhea only if he has some constipation.  No blood in the stool.  Urination: frequency with the hyzaar.  No hematuria, dysuria, some weakened stream (middle of the night).    Sleep: up and down during the week.  Social activity: bowling, play card, shoot pool.  Mother's house.   Children 1 58 women. Works for Therapist, [employer].   He reports no hx or current falls.  EtOH: 1 beer every other day.  Beer 1-2 bottles per day. Illict drug use: None at the present, 10 years ago.   Tobacco use: none, 6-7 cigarettes per day.   In ROS, patient reports breathing difficulties secondary to seasonal allergies.  He states when the pollen starts, he will have congestion, coughing, and breathing difficulty.  This can occur daily.  He will attempt albuterol daily or more.  He denies productive cough, or fever.  There is no chest pain, pnd, or leg swelling.  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  Patient Active Problem List   Diagnosis Date Noted  . ALLERGIC RHINITIS 06/01/2008  . DENTAL CARIES 06/01/2008  . CONTACT DERMATITIS DUE TO POISON IVY 04/29/2008  . GERD 03/04/2008  . HYPERCHOLESTEROLEMIA 01/30/2008  . COCAINE ABUSE 01/30/2008  . HYPERTENSION 01/30/2008  . PERIPHERAL VASCULAR DISEASE 01/30/2008  . ECZEMA 01/30/2008  . FOLLICULITIS [id number]     Past Medical History  Diagnosis Date  . Hypertension     No past surgical history on file.  Social History  Substance Use Topics  . Smoking status: Current Some Day Smoker -- 0.25 packs/day    Types: Cigarettes  . Smokeless tobacco: None  . Alcohol Use: Yes     Comment: rare    No family history on file.  Allergies  Allergen Reactions  . Lisinopril Cough    Medication list has been reviewed and updated.  Current Outpatient Prescriptions on File Prior to Visit  Medication Sig Dispense Refill  . cetirizine (ZYRTEC) 10 MG tablet Take 1 tablet (10 mg total) by mouth daily. 30 tablet 2  . losartan-hydrochlorothiazide (HYZAAR) 50-12.5 MG tablet Take 1 tablet by mouth daily. 90 tablet 3  . Omeprazole Magnesium (PRILOSEC OTC PO) Take by mouth.    . Triamcinolone Acetonide (TRIAMCINOLONE 0.1 % CREAM : EUCERIN) CREA Apply 1 application topically 2 (two) times daily as needed. 1 each 0  . triamcinolone ointment (KENALOG) 0.1 % Apply 1 application topically 2 (two) times daily. 30 g 0  . albuterol (PROVENTIL HFA;VENTOLIN HFA) 108 (90 Base) MCG/ACT inhaler Inhale 2 puffs into the lungs every 4 (four) hours as needed for wheezing or shortness of breath (cough, shortness of breath or wheezing.). (Patient not taking: Reported on 01/24/2016) 1 Inhaler 1   No current facility-administered medications on file prior to visit.  Review of Systems  Constitutional: Negative for fever and chills.  HENT: Negative for ear discharge, ear pain and sore throat.   Eyes: Negative for blurred vision and double vision.  Respiratory: Negative for cough, shortness of breath and wheezing.   Cardiovascular: Negative for chest pain, palpitations and leg swelling.  Gastrointestinal: Negative for nausea, vomiting and diarrhea.  Genitourinary: Negative for dysuria, frequency and hematuria.  Skin: Negative for itching and rash.  Neurological: Negative for dizziness and headaches.     Physical  Examination: BP 121/75 mmHg  Pulse 85  Temp(Src) 97.5 F (36.4 C)  Resp 16  Ht 6\' 2"  (1.88 m)  Wt 190 lb (86.183 kg)  BMI 24.38 kg/m2 Ideal Body Weight: Weight in (lb) to have BMI = 25: 194.3  Physical Exam  Constitutional: He is oriented to person, place, and time. He appears well-developed and well-nourished. No distress.  HENT:  Head: Normocephalic and atraumatic.  Right Ear: Tympanic membrane, external ear and ear canal normal.  Left Ear: Tympanic membrane, external ear and ear canal normal.  Eyes: Conjunctivae and EOM are normal. Pupils are equal, round, and reactive to light.  Cardiovascular: Normal rate and regular rhythm.  Exam reveals no friction rub.   No murmur heard. Pulmonary/Chest: Effort normal. No respiratory distress. He has no wheezes.  Abdominal: Soft. Bowel sounds are normal. He exhibits no distension and no mass. There is no tenderness.  Musculoskeletal: Normal range of motion. He exhibits no edema or tenderness.  Neurological: He is alert and oriented to person, place, and time. He displays normal reflexes.  Skin: Skin is warm and dry. He is not diaphoretic.  Psychiatric: He has a normal mood and affect. His behavior is normal.   Fall Risk  01/24/2016 01/24/2016  Falls in the past year? No No   Assessment and Plan: Andrew [name] is a 65 y.o. male who is here today for medicare welcome exam. --medication refilled. --I have advised that he get the shingles vaccine.  He will bring to pharmacy.  Ask if he received this, as he may opt out due to expense. --colonoscopy screening referral placed. --given advanced directive information and form to complete and return to office.  We discussed this at length.  He reports that he will go over with wife and return  Medicare welcome exam - Plan: Hepatic Function Panel, PSA, Lipid panel, EKG 12-Lead, zoster vaccine live, PF, (ZOSTAVAX) 09811 UNT/0.65ML injection, Hepatitis C antibody, CANCELED: Ambulatory referral to  Gastroenterology  Need for prophylactic vaccination and inoculation against influenza - Plan: Flu Vaccine QUAD 36+ mos IM  Asthmatic breathing - Plan: budesonide-formoterol (SYMBICORT) 160-4.5 MCG/ACT inhaler  Need for Tdap vaccination - Plan: Tdap vaccine greater than or equal to 7yo IM  Need for shingles vaccine - Plan: zoster vaccine live, PF, (ZOSTAVAX) 91478 UNT/0.65ML injection  Need for hepatitis C screening test - Plan: Hepatitis C antibody  Screening for colon cancer - Plan: Ambulatory referral to Gastroenterology  Advanced directives, counseling/discussion  Ivar Drape, PA-C [facility] 01/24/2016 1:44 PM

## 2016-01-24 NOTE — Patient Instructions (Signed)
Hydrate well with water 65 oz or more.  Make sure that you are eating fresh fruits and vegetables.  Avoid the pastries and fastfood items.   Keeping you healthy  Get these tests  Blood pressure- Have your blood pressure checked once a year by your healthcare provider.  Normal blood pressure is 120/80  Weight- Have your body mass index (BMI) calculated to screen for obesity.  BMI is a measure of body fat based on height and weight. You can also calculate your own BMI at ViewBanking.si.  Cholesterol- Have your cholesterol checked every year.  Diabetes- Have your blood sugar checked regularly if you have high blood pressure, high cholesterol, have a family history of diabetes or if you are overweight.  Screening for Colon Cancer- Colonoscopy starting at age 14.  Screening may begin sooner depending on your family history and other health conditions. Follow up colonoscopy as directed by your Gastroenterologist.  Screening for Prostate Cancer- Both blood work (PSA) and a rectal exam help screen for Prostate Cancer.  Screening begins at age 25 with African-American men and at age 28 with Caucasian men.  Screening may begin sooner depending on your family history.  Take these medicines  Aspirin- One aspirin daily can help prevent Heart disease and Stroke.  Flu shot- Every fall.  Tetanus- Every 10 years.  Zostavax- Once after the age of 30 to prevent Shingles.  Pneumonia shot- Once after the age of 65; if you are younger than 79, ask your healthcare provider if you need a Pneumonia shot.  Take these steps  Don't smoke- If you do smoke, talk to your doctor about quitting.  For tips on how to quit, go to www.smokefree.gov or call 1-800-QUIT-NOW.  Be physically active- Exercise 5 days a week for at least 30 minutes.  If you are not already physically active start slow and gradually work up to 30 minutes of moderate physical activity.  Examples of moderate activity include walking  briskly, mowing the yard, dancing, swimming, bicycling, etc.  Eat a healthy diet- Eat a variety of healthy food such as fruits, vegetables, low fat milk, low fat cheese, yogurt, lean meant, poultry, fish, beans, tofu, etc. For more information go to www.thenutritionsource.org  Drink alcohol in moderation- Limit alcohol intake to less than two drinks a day. Never drink and drive.  Dentist- Brush and floss twice daily; visit your dentist twice a year.  Depression- Your emotional health is as important as your physical health. If you're feeling down, or losing interest in things you would normally enjoy please talk to your healthcare provider.  Eye exam- Visit your eye doctor every year.  Safe sex- If you may be exposed to a sexually transmitted infection, use a condom.  Seat belts- Seat belts can save your life; always wear one.  Smoke/Carbon Monoxide detectors- These detectors need to be installed on the appropriate level of your home.  Replace batteries at least once a year.  Skin cancer- When out in the sun, cover up and use sunscreen 15 SPF or higher.  Violence- If anyone is threatening you, please tell your healthcare provider.  Living Will/ Health care power of attorney- Speak with your healthcare provider and family.

## 2016-01-25 ENCOUNTER — Other Ambulatory Visit: Payer: Self-pay

## 2016-01-25 DIAGNOSIS — I10 Essential (primary) hypertension: Secondary | ICD-10-CM

## 2016-01-25 LAB — PSA: PSA: 5.58 ng/mL — ABNORMAL HIGH (ref <=4.00)

## 2016-01-25 LAB — HEPATITIS C ANTIBODY: HCV Ab: NEGATIVE

## 2016-01-25 MED ORDER — LOSARTAN POTASSIUM-HCTZ 50-12.5 MG PO TABS: 1.0000 | ORAL_TABLET | Freq: Every day | ORAL | Status: DC

## 2016-01-31 ENCOUNTER — Telehealth: Payer: Self-pay

## 2016-01-31 NOTE — Telephone Encounter (Signed)
Patient states that the losartan-hydrochlorothiazide (HYZAAR) 50-12.5 MG tablet and budesonide-formoterol (SYMBICORT) 160-4.5 MCG/ACT inhaler makes him feel dizzy.  He wants to know if he can cut the losartan pill in half, or if the medication needs to be changed.  The Symbicort makes him dizzy immediately after inhalation.  Please advise.  CB#: [phone] or [phone]

## 2016-02-03 NOTE — Telephone Encounter (Signed)
Pt advised. He will come in to be seen.

## 2016-02-03 NOTE — Telephone Encounter (Signed)
Please have the patient RTC

## 2016-02-04 ENCOUNTER — Ambulatory Visit (INDEPENDENT_AMBULATORY_CARE_PROVIDER_SITE_OTHER): Payer: Commercial Managed Care - HMO | Admitting: Family Medicine

## 2016-02-04 VITALS — BP 116/64 | HR 80 | Temp 98.4°F | Resp 16 | Ht 74.0 in | Wt 191.0 lb

## 2016-02-04 DIAGNOSIS — R05 Cough: Secondary | ICD-10-CM | POA: Diagnosis not present

## 2016-02-04 DIAGNOSIS — I951 Orthostatic hypotension: Secondary | ICD-10-CM | POA: Diagnosis not present

## 2016-02-04 DIAGNOSIS — R058 Other specified cough: Secondary | ICD-10-CM

## 2016-02-04 MED ORDER — AMOXICILLIN-POT CLAVULANATE 875-125 MG PO TABS: 1.0000 | ORAL_TABLET | Freq: Two times a day (BID) | ORAL | Status: DC

## 2016-02-04 MED ORDER — LOSARTAN POTASSIUM 50 MG PO TABS: 50.0000 mg | ORAL_TABLET | Freq: Every day | ORAL | Status: DC

## 2016-02-04 NOTE — Progress Notes (Signed)
Subjective:    Patient ID: Andrew [name], male    DOB: Jun 04, 1951, 65 y.o.   MRN: [id number]  By signing my name below, I, Andrew [name], attest that this documentation has been prepared under the direction and in the presence of Andrew Haber, MD. Electronically Signed: Soijett [name], ED Scribe. 02/04/2016. 11:01 AM.  Chief Complaint  Patient presents with   Medication Follow up    Allergy and HTN    HPI   HPI Comments: Andrew [name] is a 65 y.o. male with a medical hx of HTN who presents to [facility] today complaining of medication follow up onset 1.5 weeks. Pt notes that he can stand up and he will feel dizzy and he feels as if it is due to his HTN medication change and symbicort. Pt HTN medications was changed 11 days ago to hyzaar from lisinopril. He states that he is having associated symptoms of dizziness x 1 week, productive cough x thick sputum, lightheadedness, and abdominal pain with movement. He states that he has not tried any medications for the relief for his symptoms. He denies palpitations, nausea, and any other symptoms. Pt notes that he does smoke cigarettes.    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   Past Medical History  Diagnosis Date   Hypertension    Allergies  Allergen Reactions   Lisinopril Cough   Current Outpatient Prescriptions on File Prior to Visit  Medication Sig Dispense Refill   budesonide-formoterol (SYMBICORT) 160-4.5 MCG/ACT inhaler Inhale 2 puffs into the lungs 2 (two) times daily. 1 Inhaler 3   cetirizine (ZYRTEC) 10 MG tablet Take 1 tablet (10 mg total) by mouth daily. 30 tablet 2   losartan-hydrochlorothiazide (HYZAAR) 50-12.5 MG tablet Take 1 tablet by mouth daily. 90 tablet 3   Omeprazole Magnesium (PRILOSEC OTC PO) Take by mouth.     Triamcinolone Acetonide (TRIAMCINOLONE 0.1 % CREAM : EUCERIN) CREA Apply 1 application topically 2 (two) times daily as needed. 1 each 0   triamcinolone ointment  (KENALOG) 0.1 % Apply 1 application topically 2 (two) times daily. 30 g 0   albuterol (PROVENTIL HFA;VENTOLIN HFA) 108 (90 Base) MCG/ACT inhaler Inhale 2 puffs into the lungs every 4 (four) hours as needed for wheezing or shortness of breath (cough, shortness of breath or wheezing.). (Patient not taking: Reported on 01/24/2016) 1 Inhaler 1   zoster vaccine live, PF, (ZOSTAVAX) 60454 UNT/0.65ML injection Inject 19,400 Units into the skin once. (Patient not taking: Reported on 02/04/2016) 1 each 0   No current facility-administered medications on file prior to visit.     Review of Systems  Constitutional: Negative for fever and chills.  Respiratory: Positive for cough.   Gastrointestinal: Positive for abdominal pain. Negative for nausea.  Neurological: Positive for dizziness and light-headedness.  All other systems reviewed and are negative.      Objective:   Physical Exam  Constitutional: He is oriented to person, place, and time. He appears well-developed and well-nourished. No distress.  HENT:  Head: Normocephalic and atraumatic.  Mouth/Throat: Oropharynx is clear and moist.  Eyes: EOM are normal.  Neck: Neck supple.  Cardiovascular: Normal rate, regular rhythm and normal heart sounds.   No murmur heard. Pulmonary/Chest: Effort normal and breath sounds normal. No respiratory distress. He has no wheezes. He has no rales.  Abdominal: Soft. There is tenderness.  Mild tenderness to the [name] of the umbilicus.  Musculoskeletal: Normal range of motion.  Lymphadenopathy:  He has no cervical adenopathy.  Neurological: He is alert and oriented to person, place, and time.  Skin: Skin is warm and dry.  Psychiatric: He has a normal mood and affect. His behavior is normal.  Nursing note and vitals reviewed.       BP 116/64 mmHg   Pulse 80   Temp(Src) 98.4 F (36.9 C) (Oral)   Resp 16   Ht 6\' 2"  (1.88 m)   Wt 191 lb (86.637 kg)   BMI 24.51 kg/m2   SpO2 95%  Assessment & Plan:    This chart was scribed in my presence and reviewed by me personally.    ICD-9-CM ICD-10-CM   1. Productive cough 786.2 R05 amoxicillin-clavulanate (AUGMENTIN) 875-125 MG tablet  2. Orthostatic hypotension 458.0 I95.1 losartan (COZAAR) 50 MG tablet     Signed, Andrew Haber, MD

## 2016-02-04 NOTE — Patient Instructions (Addendum)
We are changing your blood pressure medicine to losartan without hydrochlorothiazide.  Also, I am giving you an antibiotic for the productive cough. He can reduce his Symbicort to just 2 puffs daily taken at night.

## 2016-02-08 NOTE — Addendum Note (Signed)
Addended by: Ivar Drape D on: 02/08/2016 09:24 AM   Modules accepted: Miquel Dunn

## 2016-03-08 ENCOUNTER — Other Ambulatory Visit: Payer: Self-pay | Admitting: Physician Assistant

## 2016-03-08 DIAGNOSIS — R972 Elevated prostate specific antigen [PSA]: Secondary | ICD-10-CM

## 2016-03-08 NOTE — Progress Notes (Signed)
Tried to call pt. But was unable to reach him left vm to call back

## 2016-03-08 NOTE — Progress Notes (Signed)
PSA is elevated.  We will refer to urology.  This could be nothing.  It can be a change in the prostate, like enlargement, among other things.  Please await this consult appointment.

## 2016-03-09 ENCOUNTER — Telehealth: Payer: Self-pay

## 2016-03-09 NOTE — Telephone Encounter (Signed)
IC pt - gave results per Stephanie's message.  Advised he will be hearing from Urology with appt.   Advised to return to clinic, fasting for follow up lipids in 3 months. Advised exercise and diet changes.

## 2016-03-15 ENCOUNTER — Other Ambulatory Visit: Payer: Self-pay | Admitting: Family Medicine

## 2016-04-13 ENCOUNTER — Telehealth: Payer: Self-pay

## 2016-04-13 DIAGNOSIS — R062 Wheezing: Secondary | ICD-10-CM

## 2016-04-13 NOTE — Telephone Encounter (Signed)
Patient would like to switch from budesonide-formoterol (SYMBICORT) 160-4.5 MCG/ACT inhaler to albuterol (PROVENTIL HFA;VENTOLIN HFA) 108 (90 Base) MCG/ACT inhaler states that the Symicort is to expensive and he states he doesn't feel like he needs to take it everyday he thinks he just needs something PRN. He thinks his insurance will cover the Albuterol but he isn't sure so can someone call and find out for him.  He uses the [place]. And his call back number is [phone], thank you!

## 2016-04-14 NOTE — Telephone Encounter (Signed)
Andrew [name],  Pt has seen you for chronic issues. Last saw Dr. Joseph Art who has now retired. Please advise if you are ok with switching medication or if patient needs to be seen. Please see comments below in previous message. Thanks  [employer]

## 2016-04-19 NOTE — Telephone Encounter (Addendum)
Please alert patient, We can definitely refill the albuterol because he will need this prn.  However if you need this albuterol more than twice per week, or you are finding that you need this at night more than twice per month--you need preventative care--like symbicort. He should not be using a short acting albuterol all the time this can cause long-term scarring of your respiratory system. It may also be that you need the symbicort during seasonal changes as we discussed.  So use it then.  But using the albuterol daily should not be the case.  Take zyrtec, claritin, or allegra daily during allergy symptoms.  If you need the albuterol daily at that time, and when allergies are not a factor, then you need symbicort.

## 2016-04-20 DIAGNOSIS — R351 Nocturia: Secondary | ICD-10-CM | POA: Diagnosis not present

## 2016-04-20 DIAGNOSIS — N401 Enlarged prostate with lower urinary tract symptoms: Secondary | ICD-10-CM | POA: Diagnosis not present

## 2016-04-20 DIAGNOSIS — R972 Elevated prostate specific antigen [PSA]: Secondary | ICD-10-CM | POA: Diagnosis not present

## 2016-04-20 MED ORDER — ALBUTEROL SULFATE HFA 108 (90 BASE) MCG/ACT IN AERS: 2.0000 | INHALATION_SPRAY | RESPIRATORY_TRACT | Status: DC | PRN

## 2016-04-20 NOTE — Telephone Encounter (Signed)
LMTRC

## 2016-04-23 NOTE — Telephone Encounter (Signed)
Patient aware via phone.

## 2016-04-26 ENCOUNTER — Other Ambulatory Visit: Payer: Self-pay | Admitting: Emergency Medicine

## 2016-04-26 ENCOUNTER — Encounter: Payer: Self-pay | Admitting: Internal Medicine

## 2016-04-26 ENCOUNTER — Encounter: Payer: Self-pay | Admitting: Physician Assistant

## 2016-05-24 DIAGNOSIS — N401 Enlarged prostate with lower urinary tract symptoms: Secondary | ICD-10-CM | POA: Diagnosis not present

## 2016-05-24 DIAGNOSIS — R351 Nocturia: Secondary | ICD-10-CM | POA: Diagnosis not present

## 2016-05-26 ENCOUNTER — Telehealth: Payer: Self-pay

## 2016-05-26 NOTE — Telephone Encounter (Signed)
Patient is calling to request a refill for losartan.  [facility] [address].

## 2016-05-28 ENCOUNTER — Other Ambulatory Visit: Payer: Self-pay

## 2016-05-28 DIAGNOSIS — I951 Orthostatic hypotension: Secondary | ICD-10-CM

## 2016-05-28 MED ORDER — LOSARTAN POTASSIUM 50 MG PO TABS: 50.0000 mg | ORAL_TABLET | Freq: Every day | ORAL | Status: DC

## 2016-05-28 NOTE — Telephone Encounter (Signed)
Rx sent in for the patient. Left detailed message on patient's VM.

## 2016-05-28 NOTE — Telephone Encounter (Signed)
Patient wanted a refill of his Losartan. Original RX was sent to [facility] mail delivery, [facility] [address].

## 2016-05-30 ENCOUNTER — Other Ambulatory Visit: Payer: Self-pay | Admitting: Physician Assistant

## 2016-05-30 ENCOUNTER — Encounter: Payer: Self-pay | Admitting: Physician Assistant

## 2016-05-30 DIAGNOSIS — N400 Enlarged prostate without lower urinary tract symptoms: Secondary | ICD-10-CM | POA: Insufficient documentation

## 2016-05-30 DIAGNOSIS — N138 Other obstructive and reflux uropathy: Secondary | ICD-10-CM | POA: Insufficient documentation

## 2016-06-17 ENCOUNTER — Encounter (HOSPITAL_COMMUNITY): Payer: Self-pay | Admitting: *Deleted

## 2016-06-17 ENCOUNTER — Ambulatory Visit (HOSPITAL_COMMUNITY)
Admission: EM | Admit: 2016-06-17 | Discharge: 2016-06-17 | Disposition: A | Discharge status: Home / Self Care | Payer: Commercial Managed Care - HMO | Attending: Family Medicine | Admitting: Family Medicine

## 2016-06-17 ENCOUNTER — Ambulatory Visit (INDEPENDENT_AMBULATORY_CARE_PROVIDER_SITE_OTHER): Payer: Commercial Managed Care - HMO

## 2016-06-17 DIAGNOSIS — R05 Cough: Secondary | ICD-10-CM | POA: Diagnosis not present

## 2016-06-17 DIAGNOSIS — R062 Wheezing: Secondary | ICD-10-CM

## 2016-06-17 DIAGNOSIS — J441 Chronic obstructive pulmonary disease with (acute) exacerbation: Secondary | ICD-10-CM

## 2016-06-17 DIAGNOSIS — Z720 Tobacco use: Secondary | ICD-10-CM

## 2016-06-17 DIAGNOSIS — R0602 Shortness of breath: Secondary | ICD-10-CM | POA: Diagnosis not present

## 2016-06-17 HISTORY — DX: Unspecified asthma, uncomplicated: J45.909

## 2016-06-17 HISTORY — DX: Chronic obstructive pulmonary disease, unspecified: J44.9

## 2016-06-17 MED ORDER — ALBUTEROL SULFATE HFA 108 (90 BASE) MCG/ACT IN AERS: 2.0000 | INHALATION_SPRAY | RESPIRATORY_TRACT | 0 refills | Status: DC | PRN

## 2016-06-17 MED ORDER — BUDESONIDE-FORMOTEROL FUMARATE 160-4.5 MCG/ACT IN AERO: 2.0000 | INHALATION_SPRAY | Freq: Two times a day (BID) | RESPIRATORY_TRACT | 0 refills | Status: DC

## 2016-06-17 MED ORDER — SODIUM CHLORIDE 0.9 % IN NEBU
INHALATION_SOLUTION | RESPIRATORY_TRACT | Status: AC
  Filled 2016-06-17: qty 3

## 2016-06-17 MED ORDER — IPRATROPIUM-ALBUTEROL 0.5-2.5 (3) MG/3ML IN SOLN
RESPIRATORY_TRACT | Status: AC
  Filled 2016-06-17: qty 3

## 2016-06-17 MED ORDER — IPRATROPIUM-ALBUTEROL 0.5-2.5 (3) MG/3ML IN SOLN
3.0000 mL | Freq: Once | RESPIRATORY_TRACT | Status: AC
  Administered 2016-06-17: 3 mL via RESPIRATORY_TRACT

## 2016-06-17 NOTE — ED Provider Notes (Signed)
[place]    CSN: [id number] Arrival date & time: 06/17/16  1813  First Provider Contact:  First MD Initiated Contact with Patient 06/17/16 1921    History   Chief Complaint Chief Complaint  Patient presents with  . Wheezing   HPI Andrew [name] is a 65 y.o. male presenting for wheezing and dyspnea.   He reports gradually worsening trouble breathing for 3 days associated with wheezing and productive cough. This was preceded by about 1 week of rhinorrhea and congestion. Yesterday he used his brother's albuterol MDI with enough improvement to keep him from seeking medical care. He has been out of symbicort x 1 month because he didn't know if this was a chronic med and hasn't had albuterol for months. Mucinex has also helped symptomatically. He's a smoker, but has not been able to smoke over the past few days, and reports a history of "COPD or asthma," though has never been admitted for this.   HPI  Past Medical History:  Diagnosis Date  . Asthma   . COPD (chronic obstructive pulmonary disease) ([place])   . Hypertension     Patient Active Problem List   Diagnosis Date Noted  . BPH (benign prostatic hyperplasia) 05/30/2016  . ALLERGIC RHINITIS 06/01/2008  . DENTAL CARIES 06/01/2008  . CONTACT DERMATITIS DUE TO POISON IVY 04/29/2008  . GERD 03/04/2008  . HYPERCHOLESTEROLEMIA 01/30/2008  . COCAINE ABUSE 01/30/2008  . HYPERTENSION 01/30/2008  . PERIPHERAL VASCULAR DISEASE 01/30/2008  . ECZEMA 01/30/2008  . FOLLICULITIS [id number]    History reviewed. No pertinent surgical history.  OB History    No data available       Home Medications    Prior to Admission medications   Medication Sig Start Date End Date Taking? Authorizing Provider  cetirizine (ZYRTEC) 10 MG tablet TAKE 1 TABLET (10 MG TOTAL) BY MOUTH DAILY. 04/26/16  Yes Darlyne Russian, MD  guaiFENesin (MUCINEX) 600 MG 12 hr tablet Take by mouth 2 (two) times daily.   Yes Historical Provider, MD  losartan  (COZAAR) 50 MG tablet Take 1 tablet (50 mg total) by mouth daily. 05/28/16  Yes Jaynee Eagles, PA-C  acetaminophen (TYLENOL) 500 MG tablet Take 500 mg by mouth every 6 (six) hours as needed.    Historical Provider, MD  albuterol (PROVENTIL HFA;VENTOLIN HFA) 108 (90 Base) MCG/ACT inhaler Inhale 2 puffs into the lungs every 4 (four) hours as needed for wheezing or shortness of breath (cough, shortness of breath or wheezing.). 06/17/16   Patrecia Pour, MD  budesonide-formoterol [facility]) 160-4.5 MCG/ACT inhaler Inhale 2 puffs into the lungs 2 (two) times daily. 06/17/16   Patrecia Pour, MD  Omeprazole Magnesium (PRILOSEC OTC PO) Take by mouth.    Historical Provider, MD  Triamcinolone Acetonide (TRIAMCINOLONE 0.1 % CREAM : EUCERIN) CREA Apply 1 application topically 2 (two) times daily as needed. 11/14/15   Dorian Heckle [name], PA  triamcinolone ointment (KENALOG) 0.1 % Apply 1 application topically 2 (two) times daily. 11/14/15   Dorian Heckle [name], PA  zoster vaccine live, PF, (ZOSTAVAX) 60454 UNT/0.65ML injection Inject 19,400 Units into the skin once. Patient not taking: Reported on 02/04/2016 01/24/16   Joretta Bachelor, PA    Family History No family history on file.  Social History Social History  Substance Use Topics  . Smoking status: Current Every Day Smoker    Types: Cigarettes  . Smokeless tobacco: Not on file  . Alcohol use Yes     Comment: occasional  Allergies   Lisinopril and Lisinopril-hydrochlorothiazide   Review of Systems Review of Systems No fevers, chest pain, palpitations, abd pain, N/V/D, myalgias, arthralgias, rash.   Physical Exam Triage Vital Signs ED Triage Vitals [06/17/16 1844]  Enc Vitals Group     BP 182/86     Pulse Rate 81     Resp 16     Temp 97.8 F (36.6 C)     Temp Source Oral     SpO2 95 %     Weight      Height      Head Circumference      Peak Flow      Pain Score      Pain Loc      Pain Edu?      Excl. in [place]?    No data  found.   Updated Vital Signs BP 182/86 (BP Location: Left Arm)   Pulse 81   Temp 97.8 F (36.6 C) (Oral)   Resp 16   SpO2 95%   Physical Exam  Constitutional: He is oriented to person, place, and time. He appears well-developed and well-nourished. No distress.  Eyes: EOM are normal. Pupils are equal, round, and reactive to light. No scleral icterus.  Neck: Neck supple. No JVD present.  Cardiovascular: Normal rate, regular rhythm, normal heart sounds and intact distal pulses.   No murmur heard. Pulmonary/Chest: Effort normal. No respiratory distress. He has wheezes (scattered with prolonged expiration). He has no rales.  speaking in full sentences  Abdominal: Soft. Bowel sounds are normal. He exhibits no distension. There is no tenderness.  Musculoskeletal: Normal range of motion. He exhibits no edema or tenderness.  Lymphadenopathy:    He has no cervical adenopathy.  Neurological: He is alert and oriented to person, place, and time. He exhibits normal muscle tone.  Skin: Skin is warm and dry.  Vitals reviewed.  UC Treatments / Results  Labs (all labs ordered are listed, but only abnormal results are displayed) Labs Reviewed - No data to display  EKG  EKG Interpretation None       Radiology Dg Chest 2 View  Result Date: 06/17/2016 CLINICAL DATA:  Shortness of breath and cough EXAM: CHEST  2 VIEW COMPARISON:  02/13/2015 FINDINGS: Cardiac shadow is within normal limits. The lungs are hyperinflated consistent with COPD. No focal infiltrate or sizable effusion is seen. No bony abnormality is noted. IMPRESSION: COPD without acute abnormality. Electronically Signed   By: Inez Catalina M.D.   On: 06/17/2016 19:45   CXR reviewed by myself. Interpretation is no infiltrate. Lungs are hyperexpanded consistent with COPD. No cardiomegaly or vascular congestion. No pneumothorax or significant effusion.   Procedures Procedures (including critical care time)  Medications Ordered in  UC Medications  ipratropium-albuterol (DUONEB) 0.5-2.5 (3) MG/3ML nebulizer solution 3 mL (3 mLs Nebulization Given 06/17/16 1925)     Initial Impression / Assessment and Plan / UC Course  I have reviewed the triage vital signs and the nursing notes.  Pertinent labs & imaging results that were available during my care of the patient were reviewed by me and considered in my medical decision making (see chart for details).  After breathing treatment: Pt reports   Final Clinical Impressions(s) / UC Diagnoses   Final diagnoses:  COPD exacerbation ([place])  Tobacco use   65 y.o. smoker with history of obstructive lung disease presenting with wheezing. He is not in respiratory distress or hypoxemic. CXR negative for infiltrate, though supporting Dx of COPD. Breathing  treatment improved symptoms.  - Will refill symbicort and albuterol MDI.  - Brief smoking cessation counseling today.  - He will follow up with PCP in the next month or earlier if he has dyspnea or wheezing not responsive to bronchodilators.   Elevated BP noted. Asymptomatic. F/u with PCP.   New Prescriptions Current Discharge Medication List       Patrecia Pour, MD 06/17/16 2012

## 2016-06-17 NOTE — ED Triage Notes (Signed)
C/O flare-up asthma & COPD x 2 days.  Has been using family member's albuterol inhaler (pt ran out) with some relief.  Ran out of Symbicort a while ago.

## 2016-08-03 ENCOUNTER — Other Ambulatory Visit: Payer: Self-pay | Admitting: Emergency Medicine

## 2016-08-13 DIAGNOSIS — H5200 Hypermetropia, unspecified eye: Secondary | ICD-10-CM | POA: Diagnosis not present

## 2016-08-13 DIAGNOSIS — H2513 Age-related nuclear cataract, bilateral: Secondary | ICD-10-CM | POA: Diagnosis not present

## 2016-08-14 ENCOUNTER — Other Ambulatory Visit: Payer: Self-pay

## 2016-08-14 DIAGNOSIS — R062 Wheezing: Secondary | ICD-10-CM

## 2016-08-14 MED ORDER — ALBUTEROL SULFATE HFA 108 (90 BASE) MCG/ACT IN AERS: 2.0000 | INHALATION_SPRAY | RESPIRATORY_TRACT | 2 refills | Status: DC | PRN

## 2016-08-14 NOTE — Telephone Encounter (Signed)
Patient is taking budesonide-formoterol (SYMBICORT) 160-4.5 MCG/ACT inhaler and it is costing him to much, so the pharmacy told him to see if we could change it to either Advair or something else that starts with a D, patient could not read the hand writing, because it would be cheaper.  Patient also needs his albuterol (PROVENTIL HFA;VENTOLIN HFA) 108 (90 Base) MCG/ACT inhaler refilled. He uses the [facility] on [address].  Call back number is [phone]

## 2016-08-14 NOTE — Telephone Encounter (Signed)
Colletta [place], I'll send this to you since you saw pt this year for his CPE. Do you want to change Rx to Advair? The other alternative may be Dulera? I wasn't sure what strength to pend, but did pend his albuterol.

## 2016-08-20 DIAGNOSIS — H11153 Pinguecula, bilateral: Secondary | ICD-10-CM | POA: Diagnosis not present

## 2016-08-20 DIAGNOSIS — H35413 Lattice degeneration of retina, bilateral: Secondary | ICD-10-CM | POA: Diagnosis not present

## 2016-08-20 DIAGNOSIS — H2513 Age-related nuclear cataract, bilateral: Secondary | ICD-10-CM | POA: Diagnosis not present

## 2016-08-20 DIAGNOSIS — H43813 Vitreous degeneration, bilateral: Secondary | ICD-10-CM | POA: Diagnosis not present

## 2016-09-07 ENCOUNTER — Encounter (INDEPENDENT_AMBULATORY_CARE_PROVIDER_SITE_OTHER): Payer: Commercial Managed Care - HMO | Admitting: Ophthalmology

## 2016-09-14 ENCOUNTER — Ambulatory Visit (INDEPENDENT_AMBULATORY_CARE_PROVIDER_SITE_OTHER): Payer: Commercial Managed Care - HMO | Admitting: Family Medicine

## 2016-09-14 VITALS — BP 138/80 | HR 79 | Temp 97.6°F | Resp 18 | Ht 74.0 in | Wt 205.4 lb

## 2016-09-14 DIAGNOSIS — E7800 Pure hypercholesterolemia, unspecified: Secondary | ICD-10-CM | POA: Diagnosis not present

## 2016-09-14 DIAGNOSIS — Z720 Tobacco use: Secondary | ICD-10-CM | POA: Diagnosis not present

## 2016-09-14 DIAGNOSIS — J449 Chronic obstructive pulmonary disease, unspecified: Secondary | ICD-10-CM

## 2016-09-14 DIAGNOSIS — R972 Elevated prostate specific antigen [PSA]: Secondary | ICD-10-CM

## 2016-09-14 DIAGNOSIS — R062 Wheezing: Secondary | ICD-10-CM | POA: Diagnosis not present

## 2016-09-14 DIAGNOSIS — I10 Essential (primary) hypertension: Secondary | ICD-10-CM

## 2016-09-14 LAB — PSA: PSA: 5.4 ng/mL — AB (ref <=4.0)

## 2016-09-14 MED ORDER — ATORVASTATIN CALCIUM 10 MG PO TABS: 10.0000 mg | ORAL_TABLET | Freq: Every day | ORAL | 0 refills | Status: DC

## 2016-09-14 MED ORDER — BUDESONIDE-FORMOTEROL FUMARATE 160-4.5 MCG/ACT IN AERO: 2.0000 | INHALATION_SPRAY | Freq: Two times a day (BID) | RESPIRATORY_TRACT | 3 refills | Status: DC

## 2016-09-14 NOTE — Patient Instructions (Addendum)
I will check a PSA today, and other blood work including cholesterol. Based on your previous readings, you will likely need to be on Lipitor. If you start Lipitor, return within 2-3 months at the most to recheck your liver tests.  Continue to cut back on tobacco. If you require your rescue inhaler for wheezing more than once or twice per week, or any nighttime symptoms, return to discuss COPD control further. That should also improve as you stop smoking.    Sign up for MyChart to receive your results the quickest.   Smoking Cessation, Tips for Success If you are ready to quit smoking, congratulations! You have chosen to help yourself be healthier. Cigarettes bring nicotine, tar, carbon monoxide, and other irritants into your body. Your lungs, heart, and blood vessels will be able to work better without these poisons. There are many different ways to quit smoking. Nicotine gum, nicotine patches, a nicotine inhaler, or nicotine nasal spray can help with physical craving. Hypnosis, support groups, and medicines help break the habit of smoking. WHAT THINGS CAN I DO TO MAKE QUITTING EASIER?  Here are some tips to help you quit for good:  Pick a date when you will quit smoking completely. Tell all of your friends and family about your plan to quit on that date.  Do not try to slowly cut down on the number of cigarettes you are smoking. Pick a quit date and quit smoking completely starting on that day.  Throw away all cigarettes.   Clean and remove all ashtrays from your home, work, and car.  On a card, write down your reasons for quitting. Carry the card with you and read it when you get the urge to smoke.  Cleanse your body of nicotine. Drink enough water and fluids to keep your urine clear or pale yellow. Do this after quitting to flush the nicotine from your body.  Learn to predict your moods. Do not let a bad situation be your excuse to have a cigarette. Some situations in your life might  tempt you into wanting a cigarette.  Never have "just one" cigarette. It leads to wanting another and another. Remind yourself of your decision to quit.  Change habits associated with smoking. If you smoked while driving or when feeling stressed, try other activities to replace smoking. Stand up when drinking your coffee. Brush your teeth after eating. Sit in a different chair when you read the paper. Avoid alcohol while trying to quit, and try to drink fewer caffeinated beverages. Alcohol and caffeine may urge you to smoke.  Avoid foods and drinks that can trigger a desire to smoke, such as sugary or spicy foods and alcohol.  Ask people who smoke not to smoke around you.  Have something planned to do right after eating or having a cup of coffee. For example, plan to take a walk or exercise.  Try a relaxation exercise to calm you down and decrease your stress. Remember, you may be tense and nervous for the first 2 weeks after you quit, but this will pass.  Find new activities to keep your hands busy. Play with a pen, coin, or rubber band. Doodle or draw things on paper.  Brush your teeth right after eating. This will help cut down on the craving for the taste of tobacco after meals. You can also try mouthwash.   Use oral substitutes in place of cigarettes. Try using lemon drops, carrots, cinnamon sticks, or chewing gum. Keep them handy so they  are available when you have the urge to smoke.  When you have the urge to smoke, try deep breathing.  Designate your home as a nonsmoking area.  If you are a heavy smoker, ask your health care provider about a prescription for nicotine chewing gum. It can ease your withdrawal from nicotine.  Reward yourself. Set aside the cigarette money you save and buy yourself something nice.  Look for support from others. Join a support group or smoking cessation program. Ask someone at home or at work to help you with your plan to quit smoking.  Always ask  yourself, "Do I need this cigarette or is this just a reflex?" Tell yourself, "Today, I choose not to smoke," or "I do not want to smoke." You are reminding yourself of your decision to quit.  Do not replace cigarette smoking with electronic cigarettes (commonly called e-cigarettes). The safety of e-cigarettes is unknown, and some may contain harmful chemicals.  If you relapse, do not give up! Plan ahead and think about what you will do the next time you get the urge to smoke. HOW WILL I FEEL WHEN I QUIT SMOKING? You may have symptoms of withdrawal because your body is used to nicotine (the addictive substance in cigarettes). You may crave cigarettes, be irritable, feel very hungry, cough often, get headaches, or have difficulty concentrating. The withdrawal symptoms are only temporary. They are strongest when you first quit but will go away within 10-14 days. When withdrawal symptoms occur, stay in control. Think about your reasons for quitting. Remind yourself that these are signs that your body is healing and getting used to being without cigarettes. Remember that withdrawal symptoms are easier to treat than the major diseases that smoking can cause.  Even after the withdrawal is over, expect periodic urges to smoke. However, these cravings are generally short lived and will go away whether you smoke or not. Do not smoke! WHAT RESOURCES ARE AVAILABLE TO HELP ME QUIT SMOKING? Your health care provider can direct you to community resources or hospitals for support, which may include:  Group support.  Education.  Hypnosis.  Therapy.   This information is not intended to replace advice given to you by your health care provider. Make sure you discuss any questions you have with your health care provider.   Document Released: 07/27/2004 Document Revised: 11/19/2014 Document Reviewed: 04/16/2013 Elsevier Interactive Patient Education 2016 [employer].   IF you received an x-ray today, you will  receive an invoice from [facility]. Please contact [facility] at [phone] with questions or concerns regarding your invoice.   IF you received labwork today, you will receive an invoice from [employer]. Please contact [facility] at [phone] with questions or concerns regarding your invoice.   Our billing staff will not be able to assist you with questions regarding bills from these companies.  You will be contacted with the lab results as soon as they are available. The fastest way to get your results is to activate your My Chart account. Instructions are located on the last page of this paperwork. If you have not heard from [place] regarding the results in 2 weeks, please contact this office.

## 2016-09-14 NOTE — Progress Notes (Signed)
By signing my name below, I, Mesha Guinyard, attest that this documentation has been prepared under the direction and in the presence of [name], MD.  Electronically Signed: Verlee Monte, Medical Scribe. 09/14/16. 12:50 PM.  Subjective:    Patient ID: Andrew [name], male    DOB: 1951-01-17, 66 y.o.   MRN: [id number]  HPI Chief Complaint  Patient presents with  . Blood Work    HPI Comments: Andrew [name] is a 65 y.o. male who presents to the [facility] for follow-up. Pt is fasting. Is not taking ASA daily. Denies PMHx of ulcer.  Elevated PSA: Referred to urology in April by Ivar Drape, PA-C.  Pt has a urologist visit with [facility] next Thursday (in 6 days). Was given a medication for urinary frequency that helps when he takes it QHS. Denies light-headedness dizziness. Lab Results  Component Value Date   PSA 5.58 (H) 01/24/2016   HTN: Takes losartan 50 mg QD. Deneis light-headedness, chest pain, dizziness or any other negative side effects from his medication. Lab Results  Component Value Date   CREATININE 1.14 01/13/2016   BP Readings from Last 3 Encounters:  09/14/16 138/80  06/17/16 182/86  02/04/16 116/64   HLD: Advised to follow-up in 3 months for fasting lipid levels. He is not on a statin. 10 year ASCVD risk was 31.2% based on last lab work.  Used to took a stain in the past. Lab Results  Component Value Date   CHOL 253 (H) 01/24/2016   HDL 51 01/24/2016   LDLCALC 179 (H) 01/24/2016   TRIG 116 01/24/2016   CHOLHDL 5.0 01/24/2016   Lab Results  Component Value Date   ALT 17 01/24/2016   AST 17 01/24/2016   ALKPHOS 147 (H) 01/24/2016   BILITOT 0.5 01/24/2016   COPD: Seen Aug 6th for COPD exacerbation. Recommended tobacco cessation. Symbicort was refilled and albuterol if needed.  Compliant with Symbicort BID, but still occasionally gets SOB. Pt cut back on smoking, and smokes about 1 cigarette a week. Takes  mucinex daily.  Patient Active Problem List   Diagnosis Date Noted  . BPH (benign prostatic hyperplasia) 05/30/2016  . ALLERGIC RHINITIS 06/01/2008  . DENTAL CARIES 06/01/2008  . CONTACT DERMATITIS DUE TO POISON IVY 04/29/2008  . GERD 03/04/2008  . HYPERCHOLESTEROLEMIA 01/30/2008  . COCAINE ABUSE 01/30/2008  . HYPERTENSION 01/30/2008  . PERIPHERAL VASCULAR DISEASE 01/30/2008  . ECZEMA 01/30/2008  . FOLLICULITIS [id number]   Past Medical History:  Diagnosis Date  . Asthma   . COPD (chronic obstructive pulmonary disease) ([place])   . Hypertension    No past surgical history on file. Allergies  Allergen Reactions  . Lisinopril Cough  . Lisinopril-Hydrochlorothiazide     dizziness   Prior to Admission medications   Medication Sig Start Date End Date Taking? Authorizing Provider  albuterol (PROVENTIL HFA;VENTOLIN HFA) 108 (90 Base) MCG/ACT inhaler Inhale 2 puffs into the lungs every 4 (four) hours as needed for wheezing or shortness of breath (cough, shortness of breath or wheezing.). 08/14/16  Yes Stephanie D [name], PA  budesonide-formoterol (SYMBICORT) 160-4.5 MCG/ACT inhaler Inhale 2 puffs into the lungs 2 (two) times daily. 06/17/16  Yes Patrecia Pour, MD  cetirizine (ZYRTEC) 10 MG tablet TAKE 1 TABLET (10 MG TOTAL) BY MOUTH DAILY. 08/04/16  Yes Darlyne Russian, MD  losartan (COZAAR) 50 MG tablet Take 1 tablet (50 mg total) by mouth daily. 05/28/16  Yes Jaynee Eagles, PA-C  Triamcinolone  Acetonide (TRIAMCINOLONE 0.1 % CREAM : EUCERIN) CREA Apply 1 application topically 2 (two) times daily as needed. 11/14/15  Yes Dorian Heckle [name], PA  triamcinolone ointment (KENALOG) 0.1 % Apply 1 application topically 2 (two) times daily. 11/14/15  Yes Dorian Heckle [name], PA  zoster vaccine live, PF, (ZOSTAVAX) 60454 UNT/0.65ML injection Inject 19,400 Units into the skin once. 01/24/16  Yes Dorian Heckle [name], PA  acetaminophen (TYLENOL) 500 MG tablet Take 500 mg by mouth every 6 (six) hours as needed.     Historical Provider, MD  Omeprazole Magnesium (PRILOSEC OTC PO) Take by mouth.    Historical Provider, MD   Social History   Social History  . Marital status: Legally Separated    Spouse name: N/A  . Number of children: N/A  . Years of education: N/A   Occupational History  . Not on file.   Social History Main Topics  . Smoking status: Current Every Day Smoker    Types: Cigarettes  . Smokeless tobacco: Not on file  . Alcohol use Yes     Comment: occasional  . Drug use: No  . Sexual activity: Not on file   Other Topics Concern  . Not on file   Social History Narrative  . No narrative on file   Review of Systems  Constitutional: Negative for fatigue and unexpected weight change.  Eyes: Negative for visual disturbance.  Respiratory: Positive for shortness of breath. Negative for cough and chest tightness.   Cardiovascular: Negative for chest pain, palpitations and leg swelling.  Gastrointestinal: Negative for abdominal pain and blood in stool.  Genitourinary: Positive for frequency (when not taking his medication properly).  Neurological: Negative for dizziness, light-headedness and headaches.   Objective:  Physical Exam  Constitutional: He is oriented to person, place, and time. He appears well-developed and well-nourished. No distress.  HENT:  Head: Normocephalic and atraumatic.  Eyes: Conjunctivae and EOM are normal. Pupils are equal, round, and reactive to light.  Neck: Neck supple. No JVD present. Carotid bruit is not present.  Cardiovascular: Normal rate, regular rhythm and normal heart sounds.  Exam reveals no friction rub.   No murmur heard. Pulmonary/Chest: Effort normal and breath sounds normal. No respiratory distress. He has no wheezes. He has no rales.  Musculoskeletal: He exhibits no edema (lower extremity).  Neurological: He is alert and oriented to person, place, and time.  Skin: Skin is warm and dry.  Psychiatric: He has a normal mood and affect. His  behavior is normal.  Nursing note and vitals reviewed.  BP 138/80   Pulse 79   Temp 97.6 F (36.4 C) (Oral)   Resp 18   Ht 6\' 2"  (1.88 m)   Wt 205 lb 6.4 oz (93.2 kg)   SpO2 98%   BMI 26.37 kg/m  Assessment & Plan:   Andrew [name] is a 65 y.o. male Pure hypercholesterolemia - Plan: Lipid panel, atorvastatin (LIPITOR) 10 MG tablet  - Likely will need to be on statin based on prior 10 year ASCVD risk. Lipitor was prescribed, but will check lipid panel and he can decide to wait to start that until those results are known. Once statin started, return within 2-3 months for repeat CMP and lipid testing.  Essential hypertension - Plan: COMPLETE METABOLIC PANEL WITH GFR  -Stable, check CMP, no change in meds.  Elevated PSA - Plan: PSA  -Continue follow-up with urology as planned.  Tobacco abuse  -Cutting back, illustrated significant decrease in ASCVD risk on AHA  calculator if he were to quit smoking. Advised to let me know if resources needed.  Chronic obstructive pulmonary disease, unspecified COPD type ([place]) - Plan: budesonide-formoterol (SYMBICORT) 160-4.5 MCG/ACT inhaler Asthmatic breathing - Plan: budesonide-formoterol (SYMBICORT) 160-4.5 MCG/ACT inhaler  -COPD, overall stable by report, but some episodic breakthrough symptoms. Continue Symbicort same dose for now, albuterol if needed for breakthrough symptoms, but RTC precautions discussed if increased frequency of dyspnea or breakthrough wheezing.  Meds ordered this encounter  Medications  . atorvastatin (LIPITOR) 10 MG tablet    Sig: Take 1 tablet (10 mg total) by mouth daily.    Dispense:  90 tablet    Refill:  0  . budesonide-formoterol (SYMBICORT) 160-4.5 MCG/ACT inhaler    Sig: Inhale 2 puffs into the lungs 2 (two) times daily.    Dispense:  1 Inhaler    Refill:  3   Patient Instructions   I will check a PSA today, and other blood work including cholesterol. Based on your previous readings, you will likely need to  be on Lipitor. If you start Lipitor, return within 2-3 months at the most to recheck your liver tests.  Continue to cut back on tobacco. If you require your rescue inhaler for wheezing more than once or twice per week, or any nighttime symptoms, return to discuss COPD control further. That should also improve as you stop smoking.    Sign up for MyChart to receive your results the quickest.   Smoking Cessation, Tips for Success If you are ready to quit smoking, congratulations! You have chosen to help yourself be healthier. Cigarettes bring nicotine, tar, carbon monoxide, and other irritants into your body. Your lungs, heart, and blood vessels will be able to work better without these poisons. There are many different ways to quit smoking. Nicotine gum, nicotine patches, a nicotine inhaler, or nicotine nasal spray can help with physical craving. Hypnosis, support groups, and medicines help break the habit of smoking. WHAT THINGS CAN I DO TO MAKE QUITTING EASIER?  Here are some tips to help you quit for good:  Pick a date when you will quit smoking completely. Tell all of your friends and family about your plan to quit on that date.  Do not try to slowly cut down on the number of cigarettes you are smoking. Pick a quit date and quit smoking completely starting on that day.  Throw away all cigarettes.   Clean and remove all ashtrays from your home, work, and car.  On a card, write down your reasons for quitting. Carry the card with you and read it when you get the urge to smoke.  Cleanse your body of nicotine. Drink enough water and fluids to keep your urine clear or pale yellow. Do this after quitting to flush the nicotine from your body.  Learn to predict your moods. Do not let a bad situation be your excuse to have a cigarette. Some situations in your life might tempt you into wanting a cigarette.  Never have "just one" cigarette. It leads to wanting another and another. Remind yourself of  your decision to quit.  Change habits associated with smoking. If you smoked while driving or when feeling stressed, try other activities to replace smoking. Stand up when drinking your coffee. Brush your teeth after eating. Sit in a different chair when you read the paper. Avoid alcohol while trying to quit, and try to drink fewer caffeinated beverages. Alcohol and caffeine may urge you to smoke.  Avoid foods and  drinks that can trigger a desire to smoke, such as sugary or spicy foods and alcohol.  Ask people who smoke not to smoke around you.  Have something planned to do [name] after eating or having a cup of coffee. For example, plan to take a walk or exercise.  Try a relaxation exercise to calm you down and decrease your stress. Remember, you may be tense and nervous for the first 2 weeks after you quit, but this will pass.  Find new activities to keep your hands busy. Play with a pen, coin, or rubber band. Doodle or draw things on paper.  Brush your teeth [name] after eating. This will help cut down on the craving for the taste of tobacco after meals. You can also try mouthwash.   Use oral substitutes in place of cigarettes. Try using lemon drops, carrots, cinnamon sticks, or chewing gum. Keep them handy so they are available when you have the urge to smoke.  When you have the urge to smoke, try deep breathing.  Designate your home as a nonsmoking area.  If you are a heavy smoker, ask your health care provider about a prescription for nicotine chewing gum. It can ease your withdrawal from nicotine.  Reward yourself. Set aside the cigarette money you save and buy yourself something nice.  Look for support from others. Join a support group or smoking cessation program. Ask someone at home or at work to help you with your plan to quit smoking.  Always ask yourself, "Do I need this cigarette or is this just a reflex?" Tell yourself, "Today, I choose not to smoke," or "I do not want to  smoke." You are reminding yourself of your decision to quit.  Do not replace cigarette smoking with electronic cigarettes (commonly called e-cigarettes). The safety of e-cigarettes is unknown, and some may contain harmful chemicals.  If you relapse, do not give up! Plan ahead and think about what you will do the next time you get the urge to smoke. HOW WILL I FEEL WHEN I QUIT SMOKING? You may have symptoms of withdrawal because your body is used to nicotine (the addictive substance in cigarettes). You may crave cigarettes, be irritable, feel very hungry, cough often, get headaches, or have difficulty concentrating. The withdrawal symptoms are only temporary. They are strongest when you first quit but will go away within 10-14 days. When withdrawal symptoms occur, stay in control. Think about your reasons for quitting. Remind yourself that these are signs that your body is healing and getting used to being without cigarettes. Remember that withdrawal symptoms are easier to treat than the major diseases that smoking can cause.  Even after the withdrawal is over, expect periodic urges to smoke. However, these cravings are generally short lived and will go away whether you smoke or not. Do not smoke! WHAT RESOURCES ARE AVAILABLE TO HELP ME QUIT SMOKING? Your health care provider can direct you to community resources or hospitals for support, which may include:  Group support.  Education.  Hypnosis.  Therapy.   This information is not intended to replace advice given to you by your health care provider. Make sure you discuss any questions you have with your health care provider.   Document Released: 07/27/2004 Document Revised: 11/19/2014 Document Reviewed: 04/16/2013 Elsevier Interactive Patient Education 2016 [employer].   IF you received an x-ray today, you will receive an invoice from [facility]. Please contact [facility] at [phone] with questions or concerns  regarding your invoice.  IF you received labwork today, you will receive an invoice from [employer]. Please contact [facility] at [phone] with questions or concerns regarding your invoice.   Our billing staff will not be able to assist you with questions regarding bills from these companies.  You will be contacted with the lab results as soon as they are available. The fastest way to get your results is to activate your My Chart account. Instructions are located on the last page of this paperwork. If you have not heard from [place] regarding the results in 2 weeks, please contact this office.        I personally performed the services described in this documentation, which was scribed in my presence. The recorded information has been reviewed and considered, and addended by me as needed.   Signed,   [name], MD [facility].  09/16/16 9:52 PM

## 2016-09-15 LAB — COMPLETE METABOLIC PANEL WITH GFR
ALT: 21 U/L (ref 9–46)
AST: 22 U/L (ref 10–35)
Albumin: 4.7 g/dL (ref 3.6–5.1)
Alkaline Phosphatase: 137 U/L — ABNORMAL HIGH (ref 40–115)
BUN: 13 mg/dL (ref 7–25)
CHLORIDE: 105 mmol/L (ref 98–110)
CO2: 26 mmol/L (ref 20–31)
Calcium: 10 mg/dL (ref 8.6–10.3)
Creat: 1.33 mg/dL — ABNORMAL HIGH (ref 0.70–1.25)
GFR, Est African American: 64 mL/min (ref >=60)
GFR, Est Non African American: 56 mL/min — ABNORMAL LOW (ref >=60)
GLUCOSE: 88 mg/dL (ref 65–99)
Potassium: 4.8 mmol/L (ref 3.5–5.3)
SODIUM: 140 mmol/L (ref 135–146)
Total Bilirubin: 0.9 mg/dL (ref 0.2–1.2)
Total Protein: 8.1 g/dL (ref 6.1–8.1)

## 2016-09-15 LAB — LIPID PANEL
CHOLESTEROL: 280 mg/dL — AB (ref 125–200)
HDL: 65 mg/dL (ref >=40)
LDL Cholesterol: 200 mg/dL — ABNORMAL HIGH (ref <130)
Total CHOL/HDL Ratio: 4.3 Ratio (ref <=5.0)
Triglycerides: 77 mg/dL (ref <150)
VLDL: 15 mg/dL (ref <30)

## 2016-09-28 DIAGNOSIS — N401 Enlarged prostate with lower urinary tract symptoms: Secondary | ICD-10-CM | POA: Diagnosis not present

## 2016-09-28 DIAGNOSIS — R972 Elevated prostate specific antigen [PSA]: Secondary | ICD-10-CM | POA: Diagnosis not present

## 2016-09-28 DIAGNOSIS — R351 Nocturia: Secondary | ICD-10-CM | POA: Diagnosis not present

## 2016-10-19 ENCOUNTER — Telehealth: Payer: Self-pay | Admitting: Family Medicine

## 2016-10-19 NOTE — Telephone Encounter (Signed)
Pt called about his PSA gave him his results he understood results and sent copy of labs in mail

## 2016-11-09 ENCOUNTER — Other Ambulatory Visit: Payer: Self-pay

## 2016-11-09 DIAGNOSIS — L308 Other specified dermatitis: Secondary | ICD-10-CM

## 2016-11-09 NOTE — Telephone Encounter (Signed)
Patient needs his Triamcinolone Acetonide (TRIAMCINOLONE 0.1 % CREAM : EUCERIN) CREA called into the  [facility] on [address].

## 2016-11-10 MED ORDER — TRIAMCINOLONE ACETONIDE 0.1 % EX OINT: 1.0000 "application " | TOPICAL_OINTMENT | Freq: Two times a day (BID) | CUTANEOUS | 0 refills | Status: DC

## 2016-11-10 MED ORDER — TRIAMCINOLONE 0.1 % CREAM:EUCERIN CREAM 1:1: 1.0000 "application " | TOPICAL_CREAM | Freq: Two times a day (BID) | CUTANEOUS | 0 refills | Status: DC | PRN

## 2016-11-10 NOTE — Telephone Encounter (Signed)
Last refill 11/2015 for eczema Last ov in general 09/2016

## 2016-11-10 NOTE — Telephone Encounter (Signed)
Called in 1 month and needs ov

## 2016-11-12 DIAGNOSIS — C61 Malignant neoplasm of prostate: Secondary | ICD-10-CM

## 2016-11-12 HISTORY — DX: Malignant neoplasm of prostate: C61

## 2016-11-20 DIAGNOSIS — C61 Malignant neoplasm of prostate: Secondary | ICD-10-CM | POA: Diagnosis not present

## 2016-11-20 DIAGNOSIS — R972 Elevated prostate specific antigen [PSA]: Secondary | ICD-10-CM | POA: Diagnosis not present

## 2016-12-12 ENCOUNTER — Other Ambulatory Visit: Payer: Self-pay | Admitting: Emergency Medicine

## 2016-12-14 DIAGNOSIS — C61 Malignant neoplasm of prostate: Secondary | ICD-10-CM | POA: Diagnosis not present

## 2017-01-06 ENCOUNTER — Other Ambulatory Visit: Payer: Self-pay | Admitting: Emergency Medicine

## 2017-01-07 NOTE — Telephone Encounter (Signed)
Meds ordered this encounter  Medications  . cetirizine (ZYRTEC) 10 MG tablet    Sig: TAKE 1 TABLET (10 MG TOTAL) BY MOUTH DAILY.    Dispense:  30 tablet    Refill:  2    Patient notified via My Chart. Needs to schedule visit.

## 2017-03-15 DIAGNOSIS — C61 Malignant neoplasm of prostate: Secondary | ICD-10-CM | POA: Diagnosis not present

## 2017-05-15 ENCOUNTER — Other Ambulatory Visit: Payer: Self-pay | Admitting: Urgent Care

## 2017-05-15 DIAGNOSIS — I951 Orthostatic hypotension: Secondary | ICD-10-CM

## 2017-05-17 NOTE — Telephone Encounter (Signed)
mychart message sent to pt about making an appt °

## 2017-05-31 DIAGNOSIS — C61 Malignant neoplasm of prostate: Secondary | ICD-10-CM | POA: Diagnosis not present

## 2017-06-14 DIAGNOSIS — C61 Malignant neoplasm of prostate: Secondary | ICD-10-CM | POA: Diagnosis not present

## 2017-06-17 ENCOUNTER — Other Ambulatory Visit: Payer: Self-pay | Admitting: Family Medicine

## 2017-06-17 DIAGNOSIS — I951 Orthostatic hypotension: Secondary | ICD-10-CM

## 2017-06-19 NOTE — Telephone Encounter (Signed)
MyChart message sent to pt about making an appt for more refills °

## 2017-06-29 ENCOUNTER — Ambulatory Visit (INDEPENDENT_AMBULATORY_CARE_PROVIDER_SITE_OTHER): Payer: Medicare HMO | Admitting: Family Medicine

## 2017-06-29 ENCOUNTER — Encounter: Payer: Self-pay | Admitting: Family Medicine

## 2017-06-29 VITALS — BP 142/88 | HR 71 | Temp 97.8°F | Resp 18 | Ht 74.0 in | Wt 196.0 lb

## 2017-06-29 DIAGNOSIS — I951 Orthostatic hypotension: Secondary | ICD-10-CM | POA: Diagnosis not present

## 2017-06-29 DIAGNOSIS — F17200 Nicotine dependence, unspecified, uncomplicated: Secondary | ICD-10-CM | POA: Diagnosis not present

## 2017-06-29 DIAGNOSIS — Z1211 Encounter for screening for malignant neoplasm of colon: Secondary | ICD-10-CM | POA: Diagnosis not present

## 2017-06-29 DIAGNOSIS — L739 Follicular disorder, unspecified: Secondary | ICD-10-CM

## 2017-06-29 DIAGNOSIS — R972 Elevated prostate specific antigen [PSA]: Secondary | ICD-10-CM | POA: Diagnosis not present

## 2017-06-29 DIAGNOSIS — J441 Chronic obstructive pulmonary disease with (acute) exacerbation: Secondary | ICD-10-CM

## 2017-06-29 DIAGNOSIS — L308 Other specified dermatitis: Secondary | ICD-10-CM | POA: Diagnosis not present

## 2017-06-29 DIAGNOSIS — E7800 Pure hypercholesterolemia, unspecified: Secondary | ICD-10-CM | POA: Diagnosis not present

## 2017-06-29 DIAGNOSIS — R062 Wheezing: Secondary | ICD-10-CM | POA: Diagnosis not present

## 2017-06-29 DIAGNOSIS — I10 Essential (primary) hypertension: Secondary | ICD-10-CM | POA: Diagnosis not present

## 2017-06-29 LAB — POCT CBC
GRANULOCYTE PERCENT: 71.7 % (ref 37–80)
HCT, POC: 46.4 % (ref 43.5–53.7)
HEMOGLOBIN: 15.7 g/dL (ref 14.1–18.1)
Lymph, poc: 1.9 (ref 0.6–3.4)
MCH, POC: 28.5 pg (ref 27–31.2)
MCHC: 33.8 g/dL (ref 31.8–35.4)
MCV: 84.4 fL (ref 80–97)
MID (CBC): 0.3 (ref 0–0.9)
MPV: 8.1 fL (ref 0–99.8)
PLATELET COUNT, POC: 229 10*3/uL (ref 142–424)
POC Granulocyte: 5.7 (ref 2–6.9)
POC LYMPH %: 24.4 % (ref 10–50)
POC MID %: 3.9 % (ref 0–12)
RBC: 5.49 M/uL (ref 4.69–6.13)
RDW, POC: 14 %
WBC: 7.9 10*3/uL (ref 4.6–10.2)

## 2017-06-29 MED ORDER — LOSARTAN POTASSIUM-HCTZ 50-12.5 MG PO TABS: 1.0000 | ORAL_TABLET | Freq: Every day | ORAL | 0 refills | Status: DC

## 2017-06-29 MED ORDER — PREDNISONE 20 MG PO TABS: 40.0000 mg | ORAL_TABLET | Freq: Every day | ORAL | 0 refills | Status: DC

## 2017-06-29 MED ORDER — ALBUTEROL SULFATE (2.5 MG/3ML) 0.083% IN NEBU
2.5000 mg | INHALATION_SOLUTION | Freq: Once | RESPIRATORY_TRACT | Status: AC
  Administered 2017-06-29: 2.5 mg via RESPIRATORY_TRACT

## 2017-06-29 MED ORDER — FLUTICASONE FUROATE-VILANTEROL 200-25 MCG/INH IN AEPB
1.0000 | INHALATION_SPRAY | Freq: Every day | RESPIRATORY_TRACT | 5 refills | Status: DC
Start: 2017-06-29 — End: 2017-09-19

## 2017-06-29 MED ORDER — IPRATROPIUM BROMIDE 0.02 % IN SOLN
0.5000 mg | Freq: Once | RESPIRATORY_TRACT | Status: AC
  Administered 2017-06-29: 0.5 mg via RESPIRATORY_TRACT

## 2017-06-29 MED ORDER — ALBUTEROL SULFATE HFA 108 (90 BASE) MCG/ACT IN AERS: 2.0000 | INHALATION_SPRAY | RESPIRATORY_TRACT | 11 refills | Status: AC | PRN

## 2017-06-29 MED ORDER — TRIAMCINOLONE 0.1 % CREAM:EUCERIN CREAM 1:1: 1.0000 "application " | TOPICAL_CREAM | Freq: Two times a day (BID) | CUTANEOUS | 4 refills | Status: DC | PRN

## 2017-06-29 MED ORDER — PRAVASTATIN SODIUM 40 MG PO TABS: 40.0000 mg | ORAL_TABLET | Freq: Every day | ORAL | 0 refills | Status: DC

## 2017-06-29 NOTE — Patient Instructions (Addendum)
   IF you received an x-ray today, you will receive an invoice from [facility]. Please contact  Radiology at [phone] with questions or concerns regarding your invoice.   IF you received labwork today, you will receive an invoice from [facility]. Please contact [facility] at [phone] with questions or concerns regarding your invoice.   Our billing staff will not be able to assist you with questions regarding bills from these companies.  You will be contacted with the lab results as soon as they are available. The fastest way to get your results is to activate your My Chart account. Instructions are located on the last page of this paperwork. If you have not heard from us regarding the results in 2 weeks, please contact this office.      Steps to Quit Smoking Smoking tobacco can be harmful to your health and can affect almost every organ in your body. Smoking puts you, and those around you, at risk for developing many serious chronic diseases. Quitting smoking is difficult, but it is one of the best things that you can do for your health. It is never too late to quit. What are the benefits of quitting smoking? When you quit smoking, you lower your risk of developing serious diseases and conditions, such as:  Lung cancer or lung disease, such as COPD.  Heart disease.  Stroke.  Heart attack.  Infertility.  Osteoporosis and bone fractures.  Additionally, symptoms such as coughing, wheezing, and shortness of breath may get better when you quit. You may also find that you get sick less often because your body is stronger at fighting off colds and infections. If you are pregnant, quitting smoking can help to reduce your chances of having a baby of low birth weight. How do I get ready to quit? When you decide to quit smoking, create a plan to make sure that you are successful. Before you quit:  Pick a date to quit. Set a date within the next two weeks to give you  time to prepare.  Write down the reasons why you are quitting. Keep this list in places where you will see it often, such as on your bathroom mirror or in your car or wallet.  Identify the people, places, things, and activities that make you want to smoke (triggers) and avoid them. Make sure to take these actions: ? Throw away all cigarettes at home, at work, and in your car. ? Throw away smoking accessories, such as ashtrays and lighters. ? Clean your car and make sure to empty the ashtray. ? Clean your home, including curtains and carpets.  Tell your family, friends, and coworkers that you are quitting. Support from your loved ones can make quitting easier.  Talk with your health care provider about your options for quitting smoking.  Find out what treatment options are covered by your health insurance.  What strategies can I use to quit smoking? Talk with your healthcare provider about different strategies to quit smoking. Some strategies include:  Quitting smoking altogether instead of gradually lessening how much you smoke over a period of time. Research shows that quitting "cold turkey" is more successful than gradually quitting.  Attending in-person counseling to help you build problem-solving skills. You are more likely to have success in quitting if you attend several counseling sessions. Even short sessions of 10 minutes can be effective.  Finding resources and support systems that can help you to quit smoking and remain smoke-free after you quit. These resources   are most helpful when you use them often. They can include: ? Online chats with a counselor. ? Telephone quitlines. ? Printed self-help materials. ? Support groups or group counseling. ? Text messaging programs. ? Mobile phone applications.  Taking medicines to help you quit smoking. (If you are pregnant or breastfeeding, talk with your health care provider first.) Some medicines contain nicotine and some do not.  Both types of medicines help with cravings, but the medicines that include nicotine help to relieve withdrawal symptoms. Your health care provider may recommend: ? Nicotine patches, gum, or lozenges. ? Nicotine inhalers or sprays. ? Non-nicotine medicine that is taken by mouth.  Talk with your health care provider about combining strategies, such as taking medicines while you are also receiving in-person counseling. Using these two strategies together makes you more likely to succeed in quitting than if you used either strategy on its own. If you are pregnant or breastfeeding, talk with your health care provider about finding counseling or other support strategies to quit smoking. Do not take medicine to help you quit smoking unless told to do so by your health care provider. What things can I do to make it easier to quit? Quitting smoking might feel overwhelming at first, but there is a lot that you can do to make it easier. Take these important actions:  Reach out to your family and friends and ask that they support and encourage you during this time. Call telephone quitlines, reach out to support groups, or work with a counselor for support.  Ask people who smoke to avoid smoking around you.  Avoid places that trigger you to smoke, such as bars, parties, or smoke-break areas at work.  Spend time around people who do not smoke.  Lessen stress in your life, because stress can be a smoking trigger for some people. To lessen stress, try: ? Exercising regularly. ? Deep-breathing exercises. ? Yoga. ? Meditating. ? Performing a body scan. This involves closing your eyes, scanning your body from head to toe, and noticing which parts of your body are particularly tense. Purposefully relax the muscles in those areas.  Download or purchase mobile phone or tablet apps (applications) that can help you stick to your quit plan by providing reminders, tips, and encouragement. There are many free apps,  such as QuitGuide from the CDC (Centers for Disease Control and Prevention). You can find other support for quitting smoking (smoking cessation) through smokefree.gov and other websites.  How will I feel when I quit smoking? Within the first 24 hours of quitting smoking, you may start to feel some withdrawal symptoms. These symptoms are usually most noticeable 2-3 days after quitting, but they usually do not last beyond 2-3 weeks. Changes or symptoms that you might experience include:  Mood swings.  Restlessness, anxiety, or irritation.  Difficulty concentrating.  Dizziness.  Strong cravings for sugary foods in addition to nicotine.  Mild weight gain.  Constipation.  Nausea.  Coughing or a sore throat.  Changes in how your medicines work in your body.  A depressed mood.  Difficulty sleeping (insomnia).  After the first 2-3 weeks of quitting, you may start to notice more positive results, such as:  Improved sense of smell and taste.  Decreased coughing and sore throat.  Slower heart rate.  Lower blood pressure.  Clearer skin.  The ability to breathe more easily.  Fewer sick days.  Quitting smoking is very challenging for most people. Do not get discouraged if you are not successful   the first time. Some people need to make many attempts to quit before they achieve long-term success. Do your best to stick to your quit plan, and talk with your health care provider if you have any questions or concerns. This information is not intended to replace advice given to you by your health care provider. Make sure you discuss any questions you have with your health care provider. Document Released: 10/23/2001 Document Revised: 06/26/2016 Document Reviewed: 03/15/2015 Elsevier Interactive Patient Education  2017 Elsevier Inc.  Coping with Quitting Smoking Quitting smoking is a physical and mental challenge. You will face cravings, withdrawal symptoms, and temptation. Before  quitting, work with your health care provider to make a plan that can help you cope. Preparation can help you quit and keep you from giving in. How can I cope with cravings? Cravings usually last for 5-10 minutes. If you get through it, the craving will pass. Consider taking the following actions to help you cope with cravings:  Keep your mouth busy: ? Chew sugar-free gum. ? Suck on hard candies or a straw. ? Brush your teeth.  Keep your hands and body busy: ? Immediately change to a different activity when you feel a craving. ? Squeeze or play with a ball. ? Do an activity or a hobby, like making bead jewelry, practicing needlepoint, or working with wood. ? Mix up your normal routine. ? Take a short exercise break. Go for a quick walk or run up and down stairs. ? Spend time in public places where smoking is not allowed.  Focus on doing something kind or helpful for someone else.  Call a friend or family member to talk during a craving.  Join a support group.  Call a quit line, such as 1-800-QUIT-NOW.  Talk with your health care provider about medicines that might help you cope with cravings and make quitting easier for you.  How can I deal with withdrawal symptoms? Your body may experience negative effects as it tries to get used to not having nicotine in the system. These effects are called withdrawal symptoms. They may include:  Feeling hungrier than normal.  Trouble concentrating.  Irritability.  Trouble sleeping.  Feeling depressed.  Restlessness and agitation.  Craving a cigarette.  To manage withdrawal symptoms:  Avoid places, people, and activities that trigger your cravings.  Remember why you want to quit.  Get plenty of sleep.  Avoid coffee and other caffeinated drinks. These may worsen some of your symptoms.  How can I handle social situations? Social situations can be difficult when you are quitting smoking, especially in the first few weeks. To  manage this, you can:  Avoid parties, bars, and other social situations where people might be smoking.  Avoid alcohol.  Leave right away if you have the urge to smoke.  Explain to your family and friends that you are quitting smoking. Ask for understanding and support.  Plan activities with friends or family where smoking is not an option.  What are some ways I can cope with stress? Wanting to smoke may cause stress, and stress can make you want to smoke. Find ways to manage your stress. Relaxation techniques can help. For example:  Breathe slowly and deeply, in through your nose and out through your mouth.  Listen to soothing, relaxing music.  Talk with a family member or friend about your stress.  Light a candle.  Soak in a bath or take a shower.  Think about a peaceful place.  What are   some ways I can prevent weight gain? Be aware that many people gain weight after they quit smoking. However, not everyone does. To keep from gaining weight, have a plan in place before you quit and stick to the plan after you quit. Your plan should include:  Having healthy snacks. When you have a craving, it may help to: ? Eat plain popcorn, crunchy carrots, celery, or other cut vegetables. ? Chew sugar-free gum.  Changing how you eat: ? Eat small portion sizes at meals. ? Eat 4-6 small meals throughout the day instead of 1-2 large meals a day. ? Be mindful when you eat. Do not watch television or do other things that might distract you as you eat.  Exercising regularly: ? Make time to exercise each day. If you do not have time for a long workout, do short bouts of exercise for 5-10 minutes several times a day. ? Do some form of strengthening exercise, like weight lifting, and some form of aerobic exercise, like running or swimming.  Drinking plenty of water or other low-calorie or no-calorie drinks. Drink 6-8 glasses of water daily, or as much as instructed by your health care  provider.  Summary  Quitting smoking is a physical and mental challenge. You will face cravings, withdrawal symptoms, and temptation to smoke again. Preparation can help you as you go through these challenges.  You can cope with cravings by keeping your mouth busy (such as by chewing gum), keeping your body and hands busy, and making calls to family, friends, or a helpline for people who want to quit smoking.  You can cope with withdrawal symptoms by avoiding places where people smoke, avoiding drinks with caffeine, and getting plenty of rest.  Ask your health care provider about the different ways to prevent weight gain, avoid stress, and handle social situations. This information is not intended to replace advice given to you by your health care provider. Make sure you discuss any questions you have with your health care provider. Document Released: 10/26/2016 Document Revised: 10/26/2016 Document Reviewed: 10/26/2016 Elsevier Interactive Patient Education  2018 Elsevier Inc.  

## 2017-06-29 NOTE — Progress Notes (Addendum)
By signing my name below, I, Mesha Guinyard, attest that this documentation has been prepared under the direction and in the presence of Delman Cheadle, MD.  Electronically Signed: Verlee Monte, Medical Scribe. 06/29/17. 9:16 AM.  Subjective:    Patient ID: Andrew [name], male    DOB: 08-Dec-1950, 66 y.o.   MRN: [id number]  HPI Chief Complaint  Patient presents with  . Medication Refill    zyrtec, cozaar, triamcinolone    HPI Comments: Andrew [name] is a 66 y.o. male who presents to Primary Care at [facility] for medication refill.  HTN: Pt is compliant with his HTN medications. Pt is low in cozaar but he's not ran out. Denies experiencing negative side effects. Creatinine was 1.33 with EGFR of 64  HLD: Last visit 9 months prior when he was statred on lipitor and asked to return for CMP and lipids in 2-3 months, which he did not do. Pt hasn't taken lipitor since it cost $300. Pt is fasting.  COPD: It has been worsening for the past 3 weeks. He reports associated sxs of wheezing, and coughing up "cakes" of clear/white sputum, which is more than usual. Pt uses albuterol when he wakes up in the middle of the night (from nocturia) and before going to work. He's been out of symbicort for 2 weeks, but hasn't purchased another inhaler due to financial concerns. Pt is still smoking, but he reports cutting back to a pack every 3-4 days. Pt has never tried medication for cessation, but would like to.His last CXR was a year ago.   Prostate CA Screening: PSA 5.4. However, both of these were consistent with readings earlier that year. Pt just started seeing a urologist and he's had 2 biopsies. The first  number were he sae as last time  Rash: Pt has a itchy rash located on his left and [name] shoulder for the last year. He will occasionally get on his legs and it worsens in the winter vs summer. Pt moisturizes his legs with vaseline. He's used triamcinolone for relief of his sxs.  Patient Active  Problem List   Diagnosis Date Noted  . BPH (benign prostatic hyperplasia) 05/30/2016  . ALLERGIC RHINITIS 06/01/2008  . DENTAL CARIES 06/01/2008  . CONTACT DERMATITIS DUE TO POISON IVY 04/29/2008  . GERD 03/04/2008  . HYPERCHOLESTEROLEMIA 01/30/2008  . COCAINE ABUSE 01/30/2008  . HYPERTENSION 01/30/2008  . PERIPHERAL VASCULAR DISEASE 01/30/2008  . ECZEMA 01/30/2008  . FOLLICULITIS 40/98/1191   Past Medical History:  Diagnosis Date  . Asthma   . COPD (chronic obstructive pulmonary disease) ([place])   . Hypertension    History reviewed. No pertinent surgical history. Allergies  Allergen Reactions  . Lisinopril Cough  . Lisinopril-Hydrochlorothiazide     dizziness   Prior to Admission medications   Medication Sig Start Date End Date Taking? Authorizing Provider  acetaminophen (TYLENOL) 500 MG tablet Take 500 mg by mouth every 6 (six) hours as needed.   Yes [provider]  albuterol (PROVENTIL HFA;VENTOLIN HFA) 108 (90 Base) MCG/ACT inhaler Inhale 2 puffs into the lungs every 4 (four) hours as needed for wheezing or shortness of breath (cough, shortness of breath or wheezing.). 08/14/16  Yes [name], Colletta Maryland D, PA  atorvastatin (LIPITOR) 10 MG tablet Take 1 tablet (10 mg total) by mouth daily. 09/14/16  Yes Wendie Agreste, MD  budesonide-formoterol [facility] 160-4.5 MCG/ACT inhaler Inhale 2 puffs into the lungs 2 (two) times daily. 09/14/16  Yes Wendie Agreste, MD  cetirizine (  ZYRTEC) 10 MG tablet TAKE 1 TABLET (10 MG TOTAL) BY MOUTH DAILY. 12/12/16  Yes Weber, Sarah L, PA-C  cetirizine (ZYRTEC) 10 MG tablet TAKE 1 TABLET (10 MG TOTAL) BY MOUTH DAILY. 01/07/17  Yes Jeffery, Chelle, PA-C  losartan (COZAAR) 50 MG tablet TAKE 1 TABLET BY MOUTH ONCE DAILY 06/17/17  Yes Shawnee Knapp, MD  Omeprazole Magnesium (PRILOSEC OTC PO) Take by mouth.   Yes [provider]  Triamcinolone Acetonide (TRIAMCINOLONE 0.1 % CREAM : EUCERIN) CREA Apply 1 application topically 2 (two)  times daily as needed. 11/10/16  Yes Wendie Agreste, MD  triamcinolone ointment (KENALOG) 0.1 % Apply 1 application topically 2 (two) times daily. 11/10/16  Yes Shawnee Knapp, MD  zoster vaccine live, PF, (ZOSTAVAX) 32440 UNT/0.65ML injection Inject 19,400 Units into the skin once. 01/24/16  Yes Ivar Drape D, PA  History reviewed. No pertinent family history.  Social History   Social History  . Marital status: Legally Separated    Spouse name: N/A  . Number of children: N/A  . Years of education: N/A   Occupational History  . Not on file.   Social History Main Topics  . Smoking status: Current Every Day Smoker    Types: Cigarettes  . Smokeless tobacco: Never Used  . Alcohol use Yes     Comment: occasional  . Drug use: No  . Sexual activity: Not on file   Other Topics Concern  . Not on file   Social History Narrative  . No narrative on file   Depression screen [facility] 2/9 06/29/2017 09/14/2016 02/04/2016 01/24/2016 01/24/2016  Decreased Interest 0 0 0 0 0  Down, Depressed, Hopeless 0 0 0 0 0  PHQ - 2 Score 0 0 0 0 0    Review of Systems  Respiratory: Positive for cough and shortness of breath.   Skin: Positive for rash.   Objective:  Physical Exam  Constitutional: He appears well-developed and well-nourished. No distress.  HENT:  Head: Normocephalic and atraumatic.  Eyes: Conjunctivae are normal.  Neck: Neck supple.  Cardiovascular: Normal rate, regular rhythm and normal heart sounds.  Exam reveals no gallop and no friction rub.   No murmur heard. Pulmonary/Chest: Effort normal. He has decreased breath sounds (throughout). He has wheezes (expiratory wheezing at the RLL). He has no rhonchi. He has no rales.  Neurological: He is alert.  Skin: Skin is warm and dry.  Psychiatric: He has a normal mood and affect. His behavior is normal.  Nursing note and vitals reviewed.   Vitals:   06/29/17 0850  BP: (!) 200/97  Pulse: 71  Resp: 18  Temp: 97.8 F (36.6 C)    TempSrc: Oral  SpO2: 96%  Weight: 196 lb (88.9 kg)  Height: '6\' 2"'  (1.88 m)  Body mass index is 25.16 kg/m.   Assessment & Plan:   1. Essential hypertension - on losartan 50 - add in hctz 12.5. Recheck in 6 wks  2. HYPERCHOLESTEROLEMIA - start pravastatin 40  3. COPD exacerbation ([place]) -sig improvement after duoneb in office. refilled alb. Start prednisone 40 x5d. Suspect flair due to allergies or mild viral. Call immed if worsening for abx coverage.  Start Breo Ellipta  4. Tobacco use disorder - encouraged cessation - pt interested, will refer to screening lung CT with smoking cessation visit prior  5. Elevated PSA - currently following with urology, has recently undergone biopsies  6. Folliculitis - wet warm compresses  7. Special screening for malignant neoplasms, colon -  pt agrees to cologuard  8. Wheezing   9. Other eczema   10. Orthostatic hypotension     Orders Placed This Encounter  Procedures  . Lipid panel    Order Specific Question:   Has the patient fasted?    Answer:   Yes  . Comprehensive metabolic panel    Order Specific Question:   Has the patient fasted?    Answer:   Yes  . Cologuard  . Ambulatory Referral for Lung Cancer Scre    Referral Priority:   Routine    Referral Type:   Consultation    Referral Reason:   Specialty Services Required    Number of Visits Requested:   1  . Care order/instruction:    Scheduling Instructions:     Recheck BP  . POCT CBC    Meds ordered this encounter  Medications  . albuterol (PROVENTIL) (2.5 MG/3ML) 0.083% nebulizer solution 2.5 mg  . ipratropium (ATROVENT) nebulizer solution 0.5 mg  . albuterol (PROVENTIL HFA;VENTOLIN HFA) 108 (90 Base) MCG/ACT inhaler    Sig: Inhale 2 puffs into the lungs every 4 (four) hours as needed for wheezing or shortness of breath (cough, shortness of breath or wheezing.).    Dispense:  1 Inhaler    Refill:  11  . Triamcinolone Acetonide (TRIAMCINOLONE 0.1 % CREAM : EUCERIN) CREA    Sig:  Apply 1 application topically 2 (two) times daily as needed.    Dispense:  1 each    Refill:  4  . pravastatin (PRAVACHOL) 40 MG tablet    Sig: Take 1 tablet (40 mg total) by mouth daily.    Dispense:  90 tablet    Refill:  0  . fluticasone furoate-vilanterol (BREO ELLIPTA) 200-25 MCG/INH AEPB    Sig: Inhale 1 puff into the lungs daily.    Dispense:  28 each    Refill:  5  . losartan-hydrochlorothiazide (HYZAAR) 50-12.5 MG tablet    Sig: Take 1 tablet by mouth daily.    Dispense:  90 tablet    Refill:  0  . predniSONE (DELTASONE) 20 MG tablet    Sig: Take 2 tablets (40 mg total) by mouth daily with breakfast.    Dispense:  10 tablet    Refill:  0   Over 40 min spent in face-to-face evaluation of and consultation with patient and coordination of care.  Over 50% of this time was spent counseling this patient.   I personally performed the services described in this documentation, which was scribed in my presence. The recorded information has been reviewed and considered, and addended by me as needed.   Delman Cheadle, M.D.  Primary Care at [facility] [address] [place], [place] [address] [phone] phone [phone] fax  07/01/17 10:58 PM  Results for orders placed or performed in visit on 06/29/17  Lipid panel  Result Value Ref Range   Cholesterol, Total 236 (H) 100 - 199 mg/dL   Triglycerides 85 0 - 149 mg/dL   HDL 57 >39 mg/dL   VLDL Cholesterol Cal 17 5 - 40 mg/dL   LDL Calculated 162 (H) 0 - 99 mg/dL   Chol/HDL Ratio 4.1 0.0 - 5.0 ratio  Comprehensive metabolic panel  Result Value Ref Range   Glucose 90 65 - 99 mg/dL   BUN 12 8 - 27 mg/dL   Creatinine, Ser 1.41 (H) 0.76 - 1.27 mg/dL   GFR calc non Af Amer 52 (L) >59 mL/min/1.73   GFR  calc Af Amer 60 >59 mL/min/1.73   BUN/Creatinine Ratio 9 (L) 10 - 24   Sodium 141 134 - 144 mmol/L   Potassium 4.5 3.5 - 5.2 mmol/L   Chloride 103 96 - 106 mmol/L   CO2 23 20 - 29 mmol/L   Calcium 10.0 8.6 - 10.2 mg/dL    Total Protein 7.7 6.0 - 8.5 g/dL   Albumin 4.5 3.6 - 4.8 g/dL   Globulin, Total 3.2 1.5 - 4.5 g/dL   Albumin/Globulin Ratio 1.4 1.2 - 2.2   Bilirubin Total 0.6 0.0 - 1.2 mg/dL   Alkaline Phosphatase 150 (H) 39 - 117 IU/L   AST 21 0 - 40 IU/L   ALT 18 0 - 44 IU/L  POCT CBC  Result Value Ref Range   WBC 7.9 4.6 - 10.2 K/uL   Lymph, poc 1.9 0.6 - 3.4   POC LYMPH PERCENT 24.4 10 - 50 %L   MID (cbc) 0.3 0 - 0.9   POC MID % 3.9 0 - 12 %M   POC Granulocyte 5.7 2 - 6.9   Granulocyte percent 71.7 37 - 80 %G   RBC 5.49 4.69 - 6.13 M/uL   Hemoglobin 15.7 14.1 - 18.1 g/dL   HCT, POC 46.4 43.5 - 53.7 %   MCV 84.4 80 - 97 fL   MCH, POC 28.5 27 - 31.2 pg   MCHC 33.8 31.8 - 35.4 g/dL   RDW, POC 14.0 %   Platelet Count, POC 229 142 - 424 K/uL   MPV 8.1 0 - 99.8 fL

## 2017-06-30 LAB — COMPREHENSIVE METABOLIC PANEL
A/G RATIO: 1.4 (ref 1.2–2.2)
ALBUMIN: 4.5 g/dL (ref 3.6–4.8)
ALK PHOS: 150 IU/L — AB (ref 39–117)
ALT: 18 IU/L (ref 0–44)
AST: 21 IU/L (ref 0–40)
BILIRUBIN TOTAL: 0.6 mg/dL (ref 0.0–1.2)
BUN / CREAT RATIO: 9 — AB (ref 10–24)
BUN: 12 mg/dL (ref 8–27)
CO2: 23 mmol/L (ref 20–29)
Calcium: 10 mg/dL (ref 8.6–10.2)
Chloride: 103 mmol/L (ref 96–106)
Creatinine, Ser: 1.41 mg/dL — ABNORMAL HIGH (ref 0.76–1.27)
GFR calc Af Amer: 60 mL/min/{1.73_m2} (ref >59)
GFR calc non Af Amer: 52 mL/min/{1.73_m2} — ABNORMAL LOW (ref >59)
Globulin, Total: 3.2 g/dL (ref 1.5–4.5)
Glucose: 90 mg/dL (ref 65–99)
POTASSIUM: 4.5 mmol/L (ref 3.5–5.2)
SODIUM: 141 mmol/L (ref 134–144)
TOTAL PROTEIN: 7.7 g/dL (ref 6.0–8.5)

## 2017-06-30 LAB — LIPID PANEL
Chol/HDL Ratio: 4.1 ratio (ref 0.0–5.0)
Cholesterol, Total: 236 mg/dL — ABNORMAL HIGH (ref 100–199)
HDL: 57 mg/dL (ref >39)
LDL Calculated: 162 mg/dL — ABNORMAL HIGH (ref 0–99)
TRIGLYCERIDES: 85 mg/dL (ref 0–149)
VLDL CHOLESTEROL CAL: 17 mg/dL (ref 5–40)

## 2017-07-01 ENCOUNTER — Telehealth: Payer: Self-pay | Admitting: *Deleted

## 2017-07-01 NOTE — Telephone Encounter (Signed)
Received referral for lung screening CT scan. Contacted patient and discussed preference for scan in [place]. Will forward information to Eric Form for Shared Decision Making Visit and CT in [place]. Patient has my contact number if needed.

## 2017-07-02 NOTE — Telephone Encounter (Signed)
Langley Gauss, Will you please schedule this patient for [facility] and scan.  Thanks , Judson Roch

## 2017-07-04 ENCOUNTER — Other Ambulatory Visit: Payer: Self-pay | Admitting: Acute Care

## 2017-07-04 DIAGNOSIS — Z122 Encounter for screening for malignant neoplasm of respiratory organs: Secondary | ICD-10-CM

## 2017-07-04 DIAGNOSIS — F17210 Nicotine dependence, cigarettes, uncomplicated: Principal | ICD-10-CM

## 2017-07-04 NOTE — Telephone Encounter (Signed)
New referral placed.  Will contact pt to schedule [facility] and CT

## 2017-07-13 ENCOUNTER — Telehealth: Payer: Self-pay | Admitting: Family Medicine

## 2017-07-19 ENCOUNTER — Ambulatory Visit (INDEPENDENT_AMBULATORY_CARE_PROVIDER_SITE_OTHER): Payer: Medicare HMO | Admitting: Acute Care

## 2017-07-19 ENCOUNTER — Ambulatory Visit (INDEPENDENT_AMBULATORY_CARE_PROVIDER_SITE_OTHER)
Admission: RE | Admit: 2017-07-19 | Discharge: 2017-07-19 | Disposition: A | Discharge status: Home / Self Care | Payer: Medicare HMO | Source: Ambulatory Visit | Attending: Acute Care | Admitting: Acute Care

## 2017-07-19 ENCOUNTER — Encounter: Payer: Self-pay | Admitting: Acute Care

## 2017-07-19 DIAGNOSIS — Z87891 Personal history of nicotine dependence: Secondary | ICD-10-CM | POA: Diagnosis not present

## 2017-07-19 DIAGNOSIS — F17210 Nicotine dependence, cigarettes, uncomplicated: Secondary | ICD-10-CM | POA: Diagnosis not present

## 2017-07-19 DIAGNOSIS — Z122 Encounter for screening for malignant neoplasm of respiratory organs: Secondary | ICD-10-CM

## 2017-07-19 NOTE — Progress Notes (Signed)
Shared Decision Making Visit Lung Cancer Screening Program [phone])   Eligibility:  Age 66 y.o.  Pack Years Smoking History Calculation 33-pack-year smoking history (# packs/per year x # years smoked)  Recent History of coughing up blood  no  Unexplained weight loss? no ( >Than 15 pounds within the last 6 months )  Prior History Lung / other cancer no (Diagnosis within the last 5 years already requiring surveillance chest CT Scans).  Smoking Status Current Smoker  Former Smokers: Years since quit: NA  Quit Date: NA  Visit Components:  Discussion included one or more decision making aids. yes  Discussion included risk/benefits of screening. yes  Discussion included potential follow up diagnostic testing for abnormal scans. yes  Discussion included meaning and risk of over diagnosis. yes  Discussion included meaning and risk of False Positives. yes  Discussion included meaning of total radiation exposure. yes  Counseling Included:  Importance of adherence to annual lung cancer LDCT screening. yes  Impact of comorbidities on ability to participate in the program. yes  Ability and willingness to under diagnostic treatment. yes  Smoking Cessation Counseling:  Current Smokers:   Discussed importance of smoking cessation. yes  Information about tobacco cessation classes and interventions provided to patient. yes  Patient provided with "ticket" for LDCT Scan. yes  Symptomatic Patient. no  Counseling  Diagnosis Code: Tobacco Use Z72.0  Asymptomatic Patient yes  Counseling (Intermediate counseling: > three minutes counseling) W9675  Former Smokers:   Discussed the importance of maintaining cigarette abstinence. yes  Diagnosis Code: Personal History of Nicotine Dependence. F16.384  Information about tobacco cessation classes and interventions provided to patient. Yes  Patient provided with "ticket" for LDCT Scan. yes  Written Order for Lung Cancer  Screening with LDCT placed in Epic. Yes (CT Chest Lung Cancer Screening Low Dose W/O CM) [id number] Z12.2-Screening of respiratory organs Z87.891-Personal history of nicotine dependence  I have spent 25 minutes of face to face time with Mr. Spiker discussing the risks and benefits of lung cancer screening. We viewed a power point together that explained in detail the above noted topics. We paused at intervals to allow for questions to be asked and answered to ensure understanding.We discussed that the single most powerful action that he can take to decrease his risk of developing lung cancer is to quit smoking. We discussed whether or not he is ready to commit to setting a quit date. He is working very hard at quitting smoking, but is currently not ready to set a quit date. He states he is down to 1-2 cigarettes a day. We discussed options for tools to aid in quitting smoking including nicotine replacement therapy, non-nicotine medications, support groups, Quit Smart classes, and behavior modification. We discussed that often times setting smaller, more achievable goals, such as eliminating 1 cigarette a day for a week and then 2 cigarettes a day for a week can be helpful in slowly decreasing the number of cigarettes smoked. This allows for a sense of accomplishment as well as providing a clinical benefit. I gave Mr. Findling the " Be Stronger Than Your Excuses" card with contact information for community resources, classes, free nicotine replacement therapy, and access to mobile apps, text messaging, and on-line smoking cessation help. I have also given him my card and contact information in the event he needs to contact me. We discussed the time and location of the scan, and that either Doroteo Glassman RN or I will call with the results within 24-48 hours  of receiving them. I have offered him  a copy of the power point we viewed  as a resource in the event they need reinforcement of the concepts we discussed today  in the office. The patient verbalized understanding of all of  the above and had no further questions upon leaving the office. They have my contact information in the event they have any further questions.  I spent 4 minutes counseling on smoking cessation and the health risks of continued tobacco abuse.  I explained to the patient that there has been a high incidence of coronary artery disease noted on these exams. I explained that this is a non-gated exam therefore degree or severity cannot be determined. This patient is currently on statin therapy. I have asked the patient to follow-up with their PCP regarding any incidental finding of coronary artery disease and management with diet or medication as their PCP  feels is clinically indicated. The patient verbalized understanding of the above and had no further questions upon completion of the visit.      Magdalen Spatz, NP 07/19/2017

## 2017-07-21 ENCOUNTER — Encounter (HOSPITAL_COMMUNITY): Payer: Self-pay | Admitting: Emergency Medicine

## 2017-07-21 ENCOUNTER — Emergency Department (HOSPITAL_COMMUNITY)
Admission: EM | Admit: 2017-07-21 | Discharge: 2017-07-21 | Disposition: A | Discharge status: Home / Self Care | Payer: Medicare HMO | Attending: Emergency Medicine | Admitting: Emergency Medicine

## 2017-07-21 ENCOUNTER — Emergency Department (HOSPITAL_COMMUNITY): Payer: Medicare HMO

## 2017-07-21 DIAGNOSIS — F17210 Nicotine dependence, cigarettes, uncomplicated: Secondary | ICD-10-CM | POA: Diagnosis not present

## 2017-07-21 DIAGNOSIS — E7800 Pure hypercholesterolemia, unspecified: Secondary | ICD-10-CM | POA: Diagnosis not present

## 2017-07-21 DIAGNOSIS — R0602 Shortness of breath: Secondary | ICD-10-CM | POA: Diagnosis not present

## 2017-07-21 DIAGNOSIS — J441 Chronic obstructive pulmonary disease with (acute) exacerbation: Secondary | ICD-10-CM | POA: Insufficient documentation

## 2017-07-21 DIAGNOSIS — Z79899 Other long term (current) drug therapy: Secondary | ICD-10-CM | POA: Insufficient documentation

## 2017-07-21 DIAGNOSIS — R05 Cough: Secondary | ICD-10-CM | POA: Diagnosis not present

## 2017-07-21 DIAGNOSIS — I10 Essential (primary) hypertension: Secondary | ICD-10-CM | POA: Insufficient documentation

## 2017-07-21 LAB — BASIC METABOLIC PANEL
ANION GAP: 10 (ref 5–15)
BUN: 17 mg/dL (ref 6–20)
CALCIUM: 9.5 mg/dL (ref 8.9–10.3)
CO2: 22 mmol/L (ref 22–32)
Chloride: 107 mmol/L (ref 101–111)
Creatinine, Ser: 1.62 mg/dL — ABNORMAL HIGH (ref 0.61–1.24)
GFR, EST AFRICAN AMERICAN: 49 mL/min — AB (ref >60)
GFR, EST NON AFRICAN AMERICAN: 43 mL/min — AB (ref >60)
GLUCOSE: 105 mg/dL — AB (ref 65–99)
POTASSIUM: 4.3 mmol/L (ref 3.5–5.1)
SODIUM: 139 mmol/L (ref 135–145)

## 2017-07-21 LAB — CBC
HCT: 45.8 % (ref 39.0–52.0)
HEMOGLOBIN: 15.3 g/dL (ref 13.0–17.0)
MCH: 28.4 pg (ref 26.0–34.0)
MCHC: 33.4 g/dL (ref 30.0–36.0)
MCV: 85 fL (ref 78.0–100.0)
Platelets: 314 10*3/uL (ref 150–400)
RBC: 5.39 MIL/uL (ref 4.22–5.81)
RDW: 13.6 % (ref 11.5–15.5)
WBC: 9 10*3/uL (ref 4.0–10.5)

## 2017-07-21 LAB — I-STAT TROPONIN, ED: TROPONIN I, POC: 0.01 ng/mL (ref 0.00–0.08)

## 2017-07-21 MED ORDER — ALBUTEROL SULFATE (2.5 MG/3ML) 0.083% IN NEBU
5.0000 mg | INHALATION_SOLUTION | RESPIRATORY_TRACT | Status: DC
  Administered 2017-07-21: 5 mg via RESPIRATORY_TRACT
  Filled 2017-07-21: qty 6

## 2017-07-21 MED ORDER — IPRATROPIUM BROMIDE 0.02 % IN SOLN
0.5000 mg | Freq: Once | RESPIRATORY_TRACT | Status: AC
  Administered 2017-07-21: 0.5 mg via RESPIRATORY_TRACT
  Filled 2017-07-21: qty 2.5

## 2017-07-21 MED ORDER — PREDNISONE 10 MG PO TABS: 60.0000 mg | ORAL_TABLET | Freq: Every day | ORAL | 0 refills | Status: DC

## 2017-07-21 MED ORDER — PREDNISONE 20 MG PO TABS
60.0000 mg | ORAL_TABLET | Freq: Once | ORAL | Status: AC
  Administered 2017-07-21: 60 mg via ORAL
  Filled 2017-07-21: qty 3

## 2017-07-21 MED ORDER — ALBUTEROL SULFATE (2.5 MG/3ML) 0.083% IN NEBU
INHALATION_SOLUTION | RESPIRATORY_TRACT | Status: AC
  Filled 2017-07-21: qty 6

## 2017-07-21 MED ORDER — ALBUTEROL SULFATE (2.5 MG/3ML) 0.083% IN NEBU
2.5000 mg | INHALATION_SOLUTION | Freq: Once | RESPIRATORY_TRACT | Status: AC
  Administered 2017-07-21: 2.5 mg via RESPIRATORY_TRACT

## 2017-07-21 NOTE — Discharge Instructions (Signed)
We saw you in the ER for your breathing related complains. We gave you some breathing treatments in the ER, and seems like your symptoms have improved. °Please take albuterol as needed every 4 hours. °Please take the medications prescribed. °Please refrain from smoking or smoke exposure. °Please see a primary care doctor in 1 week. °Return to the ER if your symptoms worsen. ° °

## 2017-07-21 NOTE — ED Triage Notes (Signed)
Pt having SOB for the past few days getting worse today no able to get relief with inhaler.

## 2017-07-22 ENCOUNTER — Telehealth: Payer: Self-pay | Admitting: Family Medicine

## 2017-07-22 DIAGNOSIS — I2510 Atherosclerotic heart disease of native coronary artery without angina pectoris: Secondary | ICD-10-CM

## 2017-07-22 DIAGNOSIS — J432 Centrilobular emphysema: Secondary | ICD-10-CM

## 2017-07-22 NOTE — Telephone Encounter (Signed)
Pt was calling to request a nebulizer because he had an episode over the the weekend, his ins is [employer] & they cover 80% of the cost of it.   Please advise

## 2017-07-25 ENCOUNTER — Telehealth: Payer: Self-pay | Admitting: Acute Care

## 2017-07-25 DIAGNOSIS — F17210 Nicotine dependence, cigarettes, uncomplicated: Principal | ICD-10-CM

## 2017-07-25 DIAGNOSIS — Z122 Encounter for screening for malignant neoplasm of respiratory organs: Secondary | ICD-10-CM

## 2017-07-25 NOTE — ED Provider Notes (Signed)
[facility] DEPT Provider Note   CSN: [id number] Arrival date & time: 07/21/17  0119     History   Chief Complaint Chief Complaint  Patient presents with  . Shortness of Breath    HPI Andrew [name] is a 66 y.o. male.  HPI Pt comes in with cc of DIB. Pt has hx of COPD and is a current smoker. He reports that his dib started few days ago and has progressively gotten worse since then. Pt has attempted 5-10 tx over the last 24 hours with transient relief. Pt has no chest pain. Pt has no hx of PE, DVT and denies any exogenous hormone (testosterone / estrogen) use, long distance travels or surgery in the past 6 weeks, active cancer, recent immobilization. Pt denies new cough.   Past Medical History:  Diagnosis Date  . Asthma   . COPD (chronic obstructive pulmonary disease) ([place])   . Hypertension     Patient Active Problem List   Diagnosis Date Noted  . BPH (benign prostatic hyperplasia) 05/30/2016  . ALLERGIC RHINITIS 06/01/2008  . DENTAL CARIES 06/01/2008  . CONTACT DERMATITIS DUE TO POISON IVY 04/29/2008  . GERD 03/04/2008  . HYPERCHOLESTEROLEMIA 01/30/2008  . COCAINE ABUSE 01/30/2008  . Essential hypertension 01/30/2008  . PERIPHERAL VASCULAR DISEASE 01/30/2008  . ECZEMA 01/30/2008  . Folliculitis 57/84/6962    History reviewed. No pertinent surgical history.     Home Medications    Prior to Admission medications   Medication Sig Start Date End Date Taking? Authorizing Provider  acetaminophen (TYLENOL) 500 MG tablet Take 500 mg by mouth every 6 (six) hours as needed.    [provider]  albuterol (PROVENTIL HFA;VENTOLIN HFA) 108 (90 Base) MCG/ACT inhaler Inhale 2 puffs into the lungs every 4 (four) hours as needed for wheezing or shortness of breath (cough, shortness of breath or wheezing.). 06/29/17   Shawnee Knapp, MD  cetirizine (ZYRTEC) 10 MG tablet TAKE 1 TABLET (10 MG TOTAL) BY MOUTH DAILY. 12/12/16   Weber, Sarah L, PA-C  fluticasone  furoate-vilanterol (BREO ELLIPTA) 200-25 MCG/INH AEPB Inhale 1 puff into the lungs daily. 06/29/17   Shawnee Knapp, MD  losartan-hydrochlorothiazide (HYZAAR) 50-12.5 MG tablet Take 1 tablet by mouth daily. 06/29/17   Shawnee Knapp, MD  Omeprazole Magnesium (PRILOSEC OTC PO) Take by mouth.    [provider]  pravastatin (PRAVACHOL) 40 MG tablet Take 1 tablet (40 mg total) by mouth daily. 06/29/17   Shawnee Knapp, MD  predniSONE (DELTASONE) 10 MG tablet Take 6 tablets (60 mg total) by mouth daily. 07/21/17   Varney Biles, MD  Triamcinolone Acetonide (TRIAMCINOLONE 0.1 % CREAM : EUCERIN) CREA Apply 1 application topically 2 (two) times daily as needed. 06/29/17   Shawnee Knapp, MD  triamcinolone ointment (KENALOG) 0.1 % Apply 1 application topically 2 (two) times daily. 11/10/16   Shawnee Knapp, MD  zoster vaccine live, PF, (ZOSTAVAX) 95284 UNT/0.65ML injection Inject 19,400 Units into the skin once. 01/24/16   Joretta Bachelor, PA    Family History No family history on file.  Social History Social History  Substance Use Topics  . Smoking status: Current Every Day Smoker    Packs/day: 0.75    Years: 44.00    Types: Cigarettes  . Smokeless tobacco: Never Used  . Alcohol use Yes     Comment: occasional     Allergies   Lisinopril and Lisinopril-hydrochlorothiazide   Review of Systems Review of Systems  Constitutional: Positive for  activity change.  Respiratory: Positive for shortness of breath.   Cardiovascular: Negative for chest pain.  Allergic/Immunologic: Negative for immunocompromised state.  All other systems reviewed and are negative.    Physical Exam Updated Vital Signs BP (!) 152/84   Pulse 64   Temp (!) 97.5 F (36.4 C) (Axillary)   Resp 15   SpO2 100%   Physical Exam  Constitutional: He is oriented to person, place, and time. He appears well-developed.  HENT:  Head: Normocephalic and atraumatic.  Eyes: Pupils are equal, round, and reactive to light.  Conjunctivae and EOM are normal.  Neck: Normal range of motion. Neck supple.  Cardiovascular: Normal rate and regular rhythm.   Pulmonary/Chest: [place] normal and breath sounds normal.  Abdominal: Soft. Bowel sounds are normal. He exhibits no distension and no mass. There is no tenderness. There is no rebound and no guarding.  Musculoskeletal: He exhibits no deformity.  Neurological: He is alert and oriented to person, place, and time.  Skin: Skin is warm.  Nursing note and vitals reviewed.    ED Treatments / Results  Labs (all labs ordered are listed, but only abnormal results are displayed) Labs Reviewed  BASIC METABOLIC PANEL - Abnormal; Notable for the following:       Result Value   Glucose, Bld 105 (*)    Creatinine, Ser 1.62 (*)    GFR calc non Af Amer 43 (*)    GFR calc Af Amer 49 (*)    All other components within normal limits  CBC  I-STAT TROPONIN, ED    EKG  EKG Interpretation  Date/Time:  Sunday July 21 2017 01:29:47 EDT Ventricular Rate:  86 PR Interval:  184 QRS Duration: 80 QT Interval:  348 QTC Calculation: 416 R Axis:   66 Text Interpretation:  Normal sinus rhythm Right atrial enlargement Septal infarct , age undetermined Abnormal ECG ST depression in inferior and lateral leads avR ST elevation Confirmed by Varney Biles [phone]) on 07/21/2017 4:24:49 AM       Radiology No results found.  Procedures Procedures (including critical care time)  Smoking cessation instruction/counseling given:  counseled patient on the dangers of tobacco use, advised patient to stop smoking, and reviewed strategies to maximize success. Discussion 2-3 min.   Medications Ordered in ED Medications  albuterol (PROVENTIL) (2.5 MG/3ML) 0.083% nebulizer solution 2.5 mg (2.5 mg Nebulization Given 07/21/17 0124)  ipratropium (ATROVENT) nebulizer solution 0.5 mg (0.5 mg Nebulization Given 07/21/17 0457)  predniSONE (DELTASONE) tablet 60 mg (60 mg Oral Given 07/21/17 0453)      Initial Impression / Assessment and Plan / ED Course  I have reviewed the triage vital signs and the nursing notes.  Pertinent labs & imaging results that were available during my care of the patient were reviewed by me and considered in my medical decision making (see chart for details).     Pt comes in with cc of DIB. Pt has hx of COPD, and the exam is consistent with his COPD flair ups. Pt is not sure what caused the flair up. No chest pain. Pt is not toxic. Will start treatment.  LATE ENTRY: Repeat exam reveals clearing of wheezing in all lung fields. Patient is not in any respiratory distress nor is there hypoxia. Stable for d/c.  Final Clinical Impressions(s) / ED Diagnoses   Final diagnoses:  COPD exacerbation [facility])    New Prescriptions Discharge Medication List as of 07/21/2017  5:25 AM       Varney Biles, MD  07/25/17 1738  

## 2017-07-25 NOTE — Telephone Encounter (Signed)
Pt informed of CT results per Sarah Groce, NP.  PT verbalized understanding.  Copy sent to PCP.  Order placed for 1 yr f/u CT.  

## 2017-07-29 ENCOUNTER — Other Ambulatory Visit: Payer: Self-pay | Admitting: Family Medicine

## 2017-07-29 DIAGNOSIS — I951 Orthostatic hypotension: Secondary | ICD-10-CM

## 2017-07-30 ENCOUNTER — Other Ambulatory Visit: Payer: Self-pay | Admitting: Family Medicine

## 2017-07-30 DIAGNOSIS — I951 Orthostatic hypotension: Secondary | ICD-10-CM

## 2017-07-30 NOTE — Telephone Encounter (Signed)
Pt is currently out of his losartan.  He does have a bottle left of the Hyzaar but he states that the water pill has been making him restless and have cramping in hands, legs, and feet.  He states that he does not have that reaction with regular Losartan. [facility] on [address] and [address].    [phone]

## 2017-07-30 NOTE — Telephone Encounter (Signed)
Please adv message was put in on the 10th, pt came in for an update.

## 2017-07-30 NOTE — Telephone Encounter (Signed)
PT CALLED TO SPEAK WITH A NURSE WHEN I ASKED HIM FOR HIS NAME AND DOB HE HUNG UP

## 2017-07-30 NOTE — Telephone Encounter (Signed)
Adv pt med refill may take up to 48-72 bus. Hours and his request was put in.

## 2017-07-30 NOTE — Telephone Encounter (Signed)
See message below about nebulizer  Please advise

## 2017-07-31 DIAGNOSIS — I2510 Atherosclerotic heart disease of native coronary artery without angina pectoris: Secondary | ICD-10-CM | POA: Insufficient documentation

## 2017-07-31 DIAGNOSIS — J449 Chronic obstructive pulmonary disease, unspecified: Secondary | ICD-10-CM | POA: Insufficient documentation

## 2017-07-31 MED ORDER — ALBUTEROL SULFATE (2.5 MG/3ML) 0.083% IN NEBU: 2.5000 mg | INHALATION_SOLUTION | Freq: Four times a day (QID) | RESPIRATORY_TRACT | 2 refills | Status: DC | PRN

## 2017-07-31 MED ORDER — IPRATROPIUM BROMIDE 0.02 % IN SOLN: 0.5000 mg | Freq: Four times a day (QID) | RESPIRATORY_TRACT | 2 refills | Status: DC

## 2017-07-31 NOTE — Telephone Encounter (Signed)
Please have pt schedule an office visit within the next 3 months to recheck his blood pressure and ensure his kidneys are stable.

## 2017-07-31 NOTE — Telephone Encounter (Signed)
Does he need the machine or just the medication that goes in it? If he needs the machine, does have have a DME company that he prefers to use? Rx for neb machine printed to be faxed in. Rx for the albuterol and ipratropium meds to go [facility]

## 2017-07-31 NOTE — Telephone Encounter (Signed)
Per pt message, wants refill of losartan without the HCTZ. Please advise.

## 2017-08-01 ENCOUNTER — Telehealth: Payer: Self-pay

## 2017-08-01 NOTE — Telephone Encounter (Signed)
Pt coming to 102 to pick up rx for nebulizer. He is aware.

## 2017-08-02 NOTE — Telephone Encounter (Signed)
Already completed

## 2017-08-07 DIAGNOSIS — J449 Chronic obstructive pulmonary disease, unspecified: Secondary | ICD-10-CM | POA: Diagnosis not present

## 2017-08-08 ENCOUNTER — Telehealth: Payer: Self-pay | Admitting: Family Medicine

## 2017-08-08 NOTE — Telephone Encounter (Signed)
[facility] is calling about orders for this patient   Best number [phone]

## 2017-08-09 ENCOUNTER — Telehealth: Payer: Self-pay | Admitting: *Deleted

## 2017-08-09 NOTE — Telephone Encounter (Signed)
Dr. Brigitte Pulse, [facility] stated that they sent a biomedical certified medical necessity form that needed your signature for this pt.

## 2017-08-12 NOTE — Telephone Encounter (Signed)
Signed form from [place] on the first pg but was [employer] in [place] on the other pages. . .   Placed in fax box in provider's lounge

## 2017-08-19 ENCOUNTER — Ambulatory Visit (INDEPENDENT_AMBULATORY_CARE_PROVIDER_SITE_OTHER): Payer: Medicare HMO | Admitting: Family Medicine

## 2017-08-19 ENCOUNTER — Encounter: Payer: Self-pay | Admitting: Family Medicine

## 2017-08-19 VITALS — BP 184/112 | HR 87 | Temp 98.1°F | Resp 16 | Ht 74.0 in | Wt 200.4 lb

## 2017-08-19 DIAGNOSIS — Z024 Encounter for examination for driving license: Secondary | ICD-10-CM

## 2017-08-19 NOTE — Patient Instructions (Signed)
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  Follow-up with your primary care provider for change in medications and return once levels have improved (temporary card could be issued if under 180/110).     IF you received an x-ray today, you will receive an invoice from [facility] Radiology. Please contact [facility] at [phone] with questions or concerns regarding your invoice.   IF you received labwork today, you will receive an invoice from [facility]. Please contact [facility] at [phone] with questions or concerns regarding your invoice.   Our billing staff will not be able to assist you with questions regarding bills from these companies.  You will be contacted with the lab results as soon as they are available. The fastest way to get your results is to activate your My Chart account. Instructions are located on the last page of this paperwork. If you have not heard from [place] regarding the results in 2 weeks, please contact this office.

## 2017-08-19 NOTE — Progress Notes (Signed)
Subjective:  By signing my name below, I, Andrew [name], attest that this documentation has been prepared under the direction and in the presence of Andrew Ray, MD. Electronically Signed: [name], Erma. 08/19/2017 , 3:44 PM .  Patient was seen in Room 11 .   Patient ID: Andrew [name], male    DOB: [birth year], 66 y.o.   MRN: [id number] Chief Complaint  Patient presents with  . Annual Exam    DOT   HPI Andrew [name] is a 66 y.o. male  Here for DOT physical. He has a history of COPD, HTN, hyperlipidemia, GERD, and peripheral vascular disease. His PCP is Dr. Brigitte Pulse, Laurey Arrow, MD. He wears glasses to drive. He plans to drive for furniture market.   HTN He takes Losartan 100mg  QD; denies missing any doses of his medications. He was previously prescribed Losartan-HCTZ 50-12.5mg  QD, but he had too many side effects with it so he was placed back on Losartan. He denies any use of marijuana, cocaine or any other illicit drug use. He drinks about a beer over the weekend. He denies history of heart disease or being informed of having blocked arteries in his heart.   COPD He has an inhaler as needed. He denies any coughing spells while driving. He usually has some coughs at night. He denies syncope. He has rare shortness of breath with activity; denies any chest pain with activity. He had CT done in Sept, which showed atherosclerosis.   Peripheral vascular disease He denies any pain in his feet while driving.   Seasonal Allergies He's been taking OTC zyrtec. He denies taking any decongestants.   Urine test There were some trace protein and moderate [name] in his urine today. He had biopsy twice this year. 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 He is followed by [facility]  Patient Active Problem List   Diagnosis Date Noted  . COPD (chronic obstructive pulmonary disease) with emphysema ([place]) 07/31/2017  . Coronary atherosclerosis of native coronary  artery 07/31/2017  . BPH (benign prostatic hyperplasia) 05/30/2016  . ALLERGIC RHINITIS 06/01/2008  . DENTAL CARIES 06/01/2008  . CONTACT DERMATITIS DUE TO POISON IVY 04/29/2008  . GERD 03/04/2008  . HYPERCHOLESTEROLEMIA 01/30/2008  . COCAINE ABUSE 01/30/2008  . Essential hypertension 01/30/2008  . PERIPHERAL VASCULAR DISEASE 01/30/2008  . ECZEMA 01/30/2008  . Folliculitis 59/16/3846   Past Medical History:  Diagnosis Date  . Asthma   . COPD (chronic obstructive pulmonary disease) ([place])   . Hypertension    History reviewed. No pertinent surgical history. Allergies  Allergen Reactions  . Lisinopril Cough  . Lisinopril-Hydrochlorothiazide     dizziness   Prior to Admission medications   Medication Sig Start Date End Date Taking? Authorizing Provider  acetaminophen (TYLENOL) 500 MG tablet Take 500 mg by mouth every 6 (six) hours as needed.    [provider]  albuterol (PROVENTIL HFA;VENTOLIN HFA) 108 (90 Base) MCG/ACT inhaler Inhale 2 puffs into the lungs every 4 (four) hours as needed for wheezing or shortness of breath (cough, shortness of breath or wheezing.). 06/29/17   Shawnee Knapp, MD  albuterol (PROVENTIL) (2.5 MG/3ML) 0.083% nebulizer solution Take 3 mLs (2.5 mg total) by nebulization every 6 (six) hours as needed for wheezing or shortness of breath. 07/31/17   Shawnee Knapp, MD  cetirizine (ZYRTEC) 10 MG tablet TAKE 1 TABLET (10 MG TOTAL) BY MOUTH DAILY. 12/12/16   Gale Journey, Sarah L, PA-C  fluticasone furoate-vilanterol (  BREO ELLIPTA) 200-25 MCG/INH AEPB Inhale 1 puff into the lungs daily. 06/29/17   Shawnee Knapp, MD  ipratropium (ATROVENT) 0.02 % nebulizer solution Take 2.5 mLs (0.5 mg total) by nebulization 4 (four) times daily. 07/31/17   Shawnee Knapp, MD  losartan (COZAAR) 100 MG tablet Take 1 tablet (100 mg total) by mouth daily. 07/31/17   Shawnee Knapp, MD  losartan-hydrochlorothiazide (HYZAAR) 50-12.5 MG tablet Take 1 tablet by mouth daily. 06/29/17   Shawnee Knapp, MD    Omeprazole Magnesium (PRILOSEC OTC PO) Take by mouth.    [provider]  pravastatin (PRAVACHOL) 40 MG tablet Take 1 tablet (40 mg total) by mouth daily. 06/29/17   Shawnee Knapp, MD  predniSONE (DELTASONE) 10 MG tablet Take 6 tablets (60 mg total) by mouth daily. 07/21/17   Varney Biles, MD  Triamcinolone Acetonide (TRIAMCINOLONE 0.1 % CREAM : EUCERIN) CREA Apply 1 application topically 2 (two) times daily as needed. 06/29/17   Shawnee Knapp, MD  triamcinolone ointment (KENALOG) 0.1 % Apply 1 application topically 2 (two) times daily. 11/10/16   Shawnee Knapp, MD  zoster vaccine live, PF, (ZOSTAVAX) 08676 UNT/0.65ML injection Inject 19,400 Units into the skin once. 01/24/16   Joretta Bachelor, PA   Social History   Social History  . Marital status: Legally Separated    Spouse name: N/A  . Number of children: N/A  . Years of education: N/A   Occupational History  . Not on file.   Social History Main Topics  . Smoking status: Current Every Day Smoker    Packs/day: 0.75    Years: 44.00    Types: Cigarettes  . Smokeless tobacco: Never Used  . Alcohol use Yes     Comment: occasional  . Drug use: No  . Sexual activity: Not on file   Other Topics Concern  . Not on file   Social History Narrative  . No narrative on file   Review of Systems  Constitutional: Negative for fatigue and unexpected weight change.  Eyes: Negative for visual disturbance.  Respiratory: Negative for cough, chest tightness and shortness of breath.   Cardiovascular: Negative for chest pain, palpitations and leg swelling.  Gastrointestinal: Negative for abdominal pain and [name] in stool.  Neurological: Negative for dizziness, light-headedness and headaches.       Objective:   Physical Exam  Constitutional: He is oriented to person, place, and time. He appears well-developed and well-nourished.  HENT:  Head: Normocephalic and atraumatic.  [name] Ear: External ear normal.  Left Ear: External ear  normal.  Mouth/Throat: Oropharynx is clear and moist.  Eyes: Pupils are equal, round, and reactive to light. Conjunctivae and EOM are normal.  Neck: Normal range of motion. Neck supple. No thyromegaly present.  Cardiovascular: Normal rate, regular rhythm, normal heart sounds and intact distal pulses.   Pulmonary/Chest: Effort normal and breath sounds normal. No respiratory distress. He has no wheezes.  Abdominal: Soft. He exhibits no distension. There is no tenderness. Hernia confirmed negative in the [name] inguinal area and confirmed negative in the left inguinal area.  Musculoskeletal: Normal range of motion. He exhibits no edema or tenderness.  Lymphadenopathy:    He has no cervical adenopathy.  Neurological: He is alert and oriented to person, place, and time. He has normal reflexes.  Skin: Skin is warm and dry.  Psychiatric: He has a normal mood and affect. His behavior is normal.  Vitals reviewed.   Vitals:   08/19/17 1512  08/19/17 1638 08/19/17 1702  BP: (!) 191/77 (!) 188/108 (!) 184/112  Pulse: 87    Resp: 16    Temp: 98.1 F (36.7 C)    SpO2: 97%    Weight: 200 lb 6.4 oz (90.9 kg)    Height: 6\' 2"  (1.88 m)      Visual Acuity Screening   [name] eye Left eye Both eyes  Without correction:     With correction: 20/30 20/25 20/25   Comments: 85 degrees peripheral   Hearing Screening Comments: 10 feet at a whisper      Assessment & Plan:  Andrew [name] is a 66 y.o. male Encounter for commercial driver medical examination (CDME) Medical history as above. See DOT paperwork. Unfortunately with repeat testing, [name] pressure does not reach goal of less than 180/110 for even a temporary card. He is asymptomatic at current [name] pressures.   - Advised to follow-up with primary care provider to adjust medication, then can return for recheck to determine if temporary card can be provided. Understanding expressed.  No orders of the defined types were placed in this  encounter.  Patient Instructions   Unfortunately your [name] pressure does not pass based on DOT standards.  Follow-up with your primary care provider for change in medications and return once levels have improved (temporary card could be issued if under 180/110).     IF you received an x-[name] today, you will receive an invoice from [facility]. Please contact [facility] at [phone] with questions or concerns regarding your invoice.   IF you received labwork today, you will receive an invoice from [facility]. Please contact [facility] at [phone] with questions or concerns regarding your invoice.   Our billing staff will not be able to assist you with questions regarding bills from these companies.  You will be contacted with the lab results as soon as they are available. The fastest way to get your results is to activate your My Chart account. Instructions are located on the last page of this paperwork. If you have not heard from [place] regarding the results in 2 weeks, please contact this office.       I personally performed the services described in this documentation, which was scribed in my presence. The recorded information has been reviewed and considered for accuracy and completeness, addended by me as needed, and agree with information above.  Signed,   Andrew Ray, MD Primary Care at [place].  08/21/17 10:54 PM

## 2017-08-20 DIAGNOSIS — F17200 Nicotine dependence, unspecified, uncomplicated: Secondary | ICD-10-CM | POA: Insufficient documentation

## 2017-08-20 DIAGNOSIS — F17210 Nicotine dependence, cigarettes, uncomplicated: Secondary | ICD-10-CM | POA: Insufficient documentation

## 2017-08-20 NOTE — Progress Notes (Deleted)
Subjective:    Patient ID: Andrew [name], male    DOB: 03-Jan-1951, 66 y.o.   MRN: [id number]  HPI  HPI Comments: Andrew [name] is a 66 y.o. male who presents to Primary Care at [facility] for recheck of his uncontrolled HTN COPD, HLD.  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  HTN: On losartan. 2 mos prior, started hctz 12.5 but pt could not tolerate due to muscle cramps of toes, fingers, legs. (had also been tried on this 01/2016). Encouraged pt to try to stay on the losartan-hctz and increase water and K containing foods but sxs continued to d/c'd hctz and losartan increased to 176m on 9/19 Baseline Cr 1.33 ->1.41 -> 1.6 with EGFR of 64 ->60->49 --> has NOT been rechecked since losartan was increased. hctz was added at the middle value (Cr. 1.41; eGFR 60)  HLD: Lipid panel improving without meds meds but was still far above goal (LDL 162, non-HDL 179) so started on pravastatin 40 at last visit 2 mos prior anyway due to risk factors. (rx'd lipitor prior but never filled due to cost).  COPD: Sxs seem to have worsened over the fall w/ more flaires. Trx'd w/ pred 40 x5d at our last visit < 2 mos ago as was needing to use alb inhaler in middle of night and upon awakening. 3 wks later pt was seen in ER for recurrence and treated w/ pred 60 qd x 5d after which I rx'd pt a home nebulizer machine (through [facility]) and albuterol and ipratropium nebs.  Has difficulty staying on symbicort (had gone off for sev wks at last visit) due to financial concerns so tried changing to [place] coupon. Chest CT 1 mo prior showed mild centrilobular emphysematous changes, upper lobe predominant. Tobacco use: Ongoing; trying to cut down - at visit 2 mos ago was at a pack every 3-4 days and at lung cancer screening 1 mo proir stated he was down to 1-2 cigs/d but not yet ready to set a quit date. Pt has never tried  medication for cessation, but open to it. Had screening chest CT 1 mo prior w/o area of concern for lung ca.  CAD: Chest CT showed coronary atherosclerosis in the LAD and RCA - unable to judge severity. baseline EKG abnml   Patient Active Problem List   Diagnosis Date Noted  . COPD (chronic obstructive pulmonary disease) [place]sema [place] 07/31/2017  . Coronary atherosclerosis of native coronary artery 07/31/2017  . BPH (benign prostatic hyperplasia) 05/30/2016  . ALLERGIC RHINITIS 06/01/2008  . DENTAL CARIES 06/01/2008  . CONTACT DERMATITIS DUE TO POISON IVY 04/29/2008  . GERD 03/04/2008  . HYPERCHOLESTEROLEMIA 01/30/2008  . COCAINE ABUSE 01/30/2008  . Essential hypertension 01/30/2008  . PERIPHERAL VASCULAR DISEASE 01/30/2008  . ECZEMA 01/30/2008  . Folliculitis 080/16/5537  Past Medical History:  Diagnosis Date  . Asthma   . COPD [place] obstructive pulmonary disease) ([place]   . Hypertension    No past surgical history on file. Allergies  Allergen Reactions  . Lisinopril Cough  . Lisinopril-Hydrochlorothiazide     dizziness   Prior to Admission medications   Medication Sig Start Date End Date Taking? Authorizing Provider  acetaminophen (TYLENOL) 500 MG tablet Take 500 mg by mouth every 6 (six) hours as needed.   Yes [provider]  albuterol (PROVENTIL HFA;VENTOLIN HFA) 108 (90  Base) MCG/ACT inhaler Inhale 2 puffs into the lungs every 4 (four) hours as needed for wheezing or shortness of breath (cough, shortness of breath or wheezing.). 08/14/16  Yes [name], Colletta Maryland D, PA  atorvastatin (LIPITOR) 10 MG tablet Take 1 tablet (10 mg total) by mouth daily. 09/14/16  Yes Wendie Agreste, MD  budesonide-formoterol [facility]) 160-4.5 MCG/ACT inhaler Inhale 2 puffs into the lungs 2 (two) times daily. 09/14/16  Yes Wendie Agreste, MD  cetirizine (ZYRTEC) 10 MG tablet TAKE 1 TABLET (10 MG TOTAL) BY MOUTH DAILY. 12/12/16  Yes Weber, Sarah L, PA-C  cetirizine (ZYRTEC)  10 MG tablet TAKE 1 TABLET (10 MG TOTAL) BY MOUTH DAILY. 01/07/17  Yes Jeffery, Chelle, PA-C  losartan (COZAAR) 50 MG tablet TAKE 1 TABLET BY MOUTH ONCE DAILY 06/17/17  Yes Shawnee Knapp, MD  Omeprazole Magnesium (PRILOSEC OTC PO) Take by mouth.   Yes [provider]  Triamcinolone Acetonide (TRIAMCINOLONE 0.1 % CREAM : EUCERIN) CREA Apply 1 application topically 2 (two) times daily as needed. 11/10/16  Yes Wendie Agreste, MD  triamcinolone ointment (KENALOG) 0.1 % Apply 1 application topically 2 (two) times daily. 11/10/16  Yes Shawnee Knapp, MD  zoster vaccine live, PF, (ZOSTAVAX) 24580 UNT/0.65ML injection Inject 19,400 Units into the skin once. 01/24/16  Yes Ivar Drape D, PA  No family history on file.  Social History   Social History  . Marital status: Legally Separated    Spouse name: N/A  . Number of children: N/A  . Years of education: N/A   Occupational History  . Not on file.   Social History Main Topics  . Smoking status: Current Every Day Smoker    Packs/day: 0.75    Years: 44.00    Types: Cigarettes  . Smokeless tobacco: Never Used  . Alcohol use Yes     Comment: occasional  . Drug use: No  . Sexual activity: Not on file   Other Topics Concern  . Not on file   Social History Narrative  . No narrative on file   Depression screen [facility] 2/9 08/19/2017 06/29/2017 09/14/2016 02/04/2016 01/24/2016  Decreased Interest 0 0 0 0 0  Down, Depressed, Hopeless 0 0 0 0 0  PHQ - 2 Score 0 0 0 0 0    Review of Systems Objective:  Physical Exam  Constitutional: He appears well-developed and well-nourished. No distress.  HENT:  Head: Normocephalic and atraumatic.  Eyes: Conjunctivae are normal.  Neck: Neck supple.  Cardiovascular: Normal rate, regular rhythm and normal heart sounds.  Exam reveals no gallop and no friction rub.   No murmur heard. Pulmonary/Chest: Effort normal. He has decreased breath sounds (throughout). He has wheezes (expiratory wheezing at the  RLL). He has no rhonchi. He has no rales.  Neurological: He is alert.  Skin: Skin is warm and dry.  Psychiatric: He has a normal mood and affect. His behavior is normal.  Nursing note and vitals reviewed.   There were no vitals filed for this visit.There is no height or weight on file to calculate BMI.   Assessment & Plan:  Cmp, ua Spirometry?? - none prior. Cards eval??? - baseline EKG abnml and CAD on CT Pulm eval??   I personally performed the services described in this documentation, which was scribed in my presence. The recorded information has been reviewed and considered, and addended by me as needed.   Delman Cheadle, M.D.  Primary Care at [facility] [place],  [place] [address] [phone] phone [phone] fax  08/20/17 4:19 PM

## 2017-08-21 ENCOUNTER — Ambulatory Visit: Payer: Medicare HMO | Admitting: Family Medicine

## 2017-08-29 ENCOUNTER — Ambulatory Visit: Payer: Medicare HMO | Admitting: Family Medicine

## 2017-09-02 ENCOUNTER — Ambulatory Visit (INDEPENDENT_AMBULATORY_CARE_PROVIDER_SITE_OTHER): Payer: Medicare HMO

## 2017-09-02 VITALS — BP 170/90 | HR 84 | Ht 74.0 in | Wt 206.0 lb

## 2017-09-02 DIAGNOSIS — Z1211 Encounter for screening for malignant neoplasm of colon: Secondary | ICD-10-CM | POA: Diagnosis not present

## 2017-09-02 DIAGNOSIS — Z23 Encounter for immunization: Secondary | ICD-10-CM

## 2017-09-02 DIAGNOSIS — Z0000 Encounter for general adult medical examination without abnormal findings: Secondary | ICD-10-CM | POA: Diagnosis not present

## 2017-09-02 NOTE — Patient Instructions (Addendum)
Andrew [name] , Thank you for taking time to come for your Medicare Wellness Visit. I appreciate your ongoing commitment to your health goals. Please review the following plan we discussed and let me know if I can assist you in the future.   Screening recommendations/referrals: Colonoscopy: due,  GI will contact you to setup appointment.  Recommended yearly ophthalmology/optometry visit for glaucoma screening and checkup Recommended yearly dental visit for hygiene and checkup  Vaccinations: Influenza vaccine: administered today   Pneumococcal vaccine: administered today  Tdap vaccine: declined due to insurance  Shingles vaccine: Check with your pharmacy about receiving this vaccine    Advanced directives: Advance directive discussed with you today. Even though you declined this today please call our office should you change your mind and we can give you the proper paperwork for you to fill out.   Conditions/risks identified: Try to exercise more weekly. This will help to strength your muscles and breathing ability.    Next appointment: 09/09/17 @ 5:20 pm with Dr. Brigitte Pulse   Preventive Care 66 Years and Older, Male Preventive care refers to lifestyle choices and visits with your health care provider that can promote health and wellness. What does preventive care include?  A yearly physical exam. This is also called an annual well check.  Dental exams once or twice a year.  Routine eye exams. Ask your health care provider how often you should have your eyes checked.  Personal lifestyle choices, including:  Daily care of your teeth and gums.  Regular physical activity.  Eating a healthy diet.  Avoiding tobacco and drug use.  Limiting alcohol use.  Practicing safe sex.  Taking low doses of aspirin every day.  Taking vitamin and mineral supplements as recommended by your health care provider. What happens during an annual well check? The services and screenings done by  your health care provider during your annual well check will depend on your age, overall health, lifestyle risk factors, and family history of disease. Counseling  Your health care provider may ask you questions about your:  Alcohol use.  Tobacco use.  Drug use.  Emotional well-being.  Home and relationship well-being.  Sexual activity.  Eating habits.  History of falls.  Memory and ability to understand (cognition).  Work and work Statistician. Screening  You may have the following tests or measurements:  Height, weight, and BMI.  Blood pressure.  Lipid and cholesterol levels. These may be checked every 5 years, or more frequently if you are over 39 years old.  Skin check.  Lung cancer screening. You may have this screening every year starting at age 57 if you have a 30-pack-year history of smoking and currently smoke or have quit within the past 15 years.  Fecal occult blood test (FOBT) of the stool. You may have this test every year starting at age 49.  Flexible sigmoidoscopy or colonoscopy. You may have a sigmoidoscopy every 5 years or a colonoscopy every 10 years starting at age 80.  Prostate cancer screening. Recommendations will vary depending on your family history and other risks.  Hepatitis C blood test.  Hepatitis B blood test.  Sexually transmitted disease (STD) testing.  Diabetes screening. This is done by checking your blood sugar (glucose) after you have not eaten for a while (fasting). You may have this done every 1-3 years.  Abdominal aortic aneurysm (AAA) screening. You may need this if you are a current or former smoker.  Osteoporosis. You may be screened starting at age 3  if you are at high risk. Talk with your health care provider about your test results, treatment options, and if necessary, the need for more tests. Vaccines  Your health care provider may recommend certain vaccines, such as:  Influenza vaccine. This is recommended every  year.  Tetanus, diphtheria, and acellular pertussis (Tdap, Td) vaccine. You may need a Td booster every 10 years.  Zoster vaccine. You may need this after age 57.  Pneumococcal 13-valent conjugate (PCV13) vaccine. One dose is recommended after age 52.  Pneumococcal polysaccharide (PPSV23) vaccine. One dose is recommended after age 45. Talk to your health care provider about which screenings and vaccines you need and how often you need them. This information is not intended to replace advice given to you by your health care provider. Make sure you discuss any questions you have with your health care provider. Document Released: 11/25/2015 Document Revised: 07/18/2016 Document Reviewed: 08/30/2015 Elsevier Interactive Patient Education  2017 [place] Prevention in the Home Falls can cause injuries. They can happen to people of all ages. There are many things you can do to make your home safe and to help prevent falls. What can I do on the outside of my home?  Regularly fix the edges of walkways and driveways and fix any cracks.  Remove anything that might make you trip as you walk through a door, such as a raised step or threshold.  Trim any bushes or trees on the path to your home.  Use bright outdoor lighting.  Clear any walking paths of anything that might make someone trip, such as rocks or tools.  Regularly check to see if handrails are loose or broken. Make sure that both sides of any steps have handrails.  Any raised decks and porches should have guardrails on the edges.  Have any leaves, snow, or ice cleared regularly.  Use sand or salt on walking paths during winter.  Clean up any spills in your garage right away. This includes oil or grease spills. What can I do in the bathroom?  Use night lights.  Install grab bars by the toilet and in the tub and shower. Do not use towel bars as grab bars.  Use non-skid mats or decals in the tub or shower.  If you  need to sit down in the shower, use a plastic, non-slip stool.  Keep the floor dry. Clean up any water that spills on the floor as soon as it happens.  Remove soap buildup in the tub or shower regularly.  Attach bath mats securely with double-sided non-slip rug tape.  Do not have throw rugs and other things on the floor that can make you trip. What can I do in the bedroom?  Use night lights.  Make sure that you have a light by your bed that is easy to reach.  Do not use any sheets or blankets that are too big for your bed. They should not hang down onto the floor.  Have a firm chair that has side arms. You can use this for support while you get dressed.  Do not have throw rugs and other things on the floor that can make you trip. What can I do in the kitchen?  Clean up any spills right away.  Avoid walking on wet floors.  Keep items that you use a lot in easy-to-reach places.  If you need to reach something above you, use a strong step stool that has a grab bar.  Keep electrical  cords out of the way.  Do not use floor polish or wax that makes floors slippery. If you must use wax, use non-skid floor wax.  Do not have throw rugs and other things on the floor that can make you trip. What can I do with my stairs?  Do not leave any items on the stairs.  Make sure that there are handrails on both sides of the stairs and use them. Fix handrails that are broken or loose. Make sure that handrails are as long as the stairways.  Check any carpeting to make sure that it is firmly attached to the stairs. Fix any carpet that is loose or worn.  Avoid having throw rugs at the top or bottom of the stairs. If you do have throw rugs, attach them to the floor with carpet tape.  Make sure that you have a light switch at the top of the stairs and the bottom of the stairs. If you do not have them, ask someone to add them for you. What else can I do to help prevent falls?  Wear shoes  that:  Do not have high heels.  Have rubber bottoms.  Are comfortable and fit you well.  Are closed at the toe. Do not wear sandals.  If you use a stepladder:  Make sure that it is fully opened. Do not climb a closed stepladder.  Make sure that both sides of the stepladder are locked into place.  Ask someone to hold it for you, if possible.  Clearly mark and make sure that you can see:  Any grab bars or handrails.  First and last steps.  Where the edge of each step is.  Use tools that help you move around (mobility aids) if they are needed. These include:  Canes.  Walkers.  Scooters.  Crutches.  Turn on the lights when you go into a dark area. Replace any light bulbs as soon as they burn out.  Set up your furniture so you have a clear path. Avoid moving your furniture around.  If any of your floors are uneven, fix them.  If there are any pets around you, be aware of where they are.  Review your medicines with your doctor. Some medicines can make you feel dizzy. This can increase your chance of falling. Ask your doctor what other things that you can do to help prevent falls. This information is not intended to replace advice given to you by your health care provider. Make sure you discuss any questions you have with your health care provider. Document Released: 08/25/2009 Document Revised: 04/05/2016 Document Reviewed: 12/03/2014 Elsevier Interactive Patient Education  2017 [employer].

## 2017-09-02 NOTE — Progress Notes (Signed)
Subjective:   Andrew [name] is a 66 y.o. male who presents for an Initial Medicare Annual Wellness Visit.  Review of Systems  N/A Cardiac Risk Factors include: advanced age (>20men, >48 women);hypertension;dyslipidemia;male gender    Objective:    Today's Vitals   09/02/17 1458  BP: (!) 170/90  Pulse: 84  SpO2: 98%  Weight: 206 lb (93.4 kg)  Height: 6\' 2"  (1.88 m)   Body mass index is 26.45 kg/m.  Current Medications (verified) Outpatient Encounter Prescriptions as of 09/02/2017  Medication Sig  . acetaminophen (TYLENOL) 500 MG tablet Take 500 mg by mouth every 6 (six) hours as needed.  Marland Kitchen albuterol (PROVENTIL HFA;VENTOLIN HFA) 108 (90 Base) MCG/ACT inhaler Inhale 2 puffs into the lungs every 4 (four) hours as needed for wheezing or shortness of breath (cough, shortness of breath or wheezing.).  Marland Kitchen albuterol (PROVENTIL) (2.5 MG/3ML) 0.083% nebulizer solution Take 3 mLs (2.5 mg total) by nebulization every 6 (six) hours as needed for wheezing or shortness of breath.  . cetirizine (ZYRTEC) 10 MG tablet TAKE 1 TABLET (10 MG TOTAL) BY MOUTH DAILY.  . fluticasone furoate-vilanterol (BREO ELLIPTA) 200-25 MCG/INH AEPB Inhale 1 puff into the lungs daily.  Marland Kitchen ipratropium (ATROVENT) 0.02 % nebulizer solution Take 2.5 mLs (0.5 mg total) by nebulization 4 (four) times daily.  Marland Kitchen losartan (COZAAR) 100 MG tablet Take 1 tablet (100 mg total) by mouth daily.  Marland Kitchen losartan-hydrochlorothiazide (HYZAAR) 50-12.5 MG tablet Take 1 tablet by mouth daily.  . Omeprazole Magnesium (PRILOSEC OTC PO) Take by mouth.  . pravastatin (PRAVACHOL) 40 MG tablet Take 1 tablet (40 mg total) by mouth daily.  . predniSONE (DELTASONE) 10 MG tablet Take 6 tablets (60 mg total) by mouth daily.  . Triamcinolone Acetonide (TRIAMCINOLONE 0.1 % CREAM : EUCERIN) CREA Apply 1 application topically 2 (two) times daily as needed.  . triamcinolone ointment (KENALOG) 0.1 % Apply 1 application topically 2 (two) times daily.  Marland Kitchen  zoster vaccine live, PF, (ZOSTAVAX) 16109 UNT/0.65ML injection Inject 19,400 Units into the skin once.   No facility-administered encounter medications on file as of 09/02/2017.     Allergies (verified) Lisinopril and Lisinopril-hydrochlorothiazide   History: Past Medical History:  Diagnosis Date  . Asthma   . COPD (chronic obstructive pulmonary disease) ([place])   . Hypertension    History reviewed. No pertinent surgical history. History reviewed. No pertinent family history. Social History   Occupational History  . Not on file.   Social History Main Topics  . Smoking status: Former Smoker    Packs/day: 0.75    Years: 44.00    Types: Cigarettes    Quit date: 07/03/2017  . Smokeless tobacco: Never Used  . Alcohol use Yes     Comment: occasional  . Drug use: No  . Sexual activity: Not on file   Tobacco Counseling Counseling given: Not Answered   Activities of Daily Living In your present state of health, do you have any difficulty performing the following activities: 09/02/2017  Hearing? N  Vision? N  Difficulty concentrating or making decisions? N  Walking or climbing stairs? N  Dressing or bathing? N  Doing errands, shopping? N  Preparing Food and eating ? N  Using the Toilet? N  In the past six months, have you accidently leaked urine? N  Do you have problems with loss of bowel control? N  Managing your Medications? N  Managing your Finances? N  Housekeeping or managing your Housekeeping? N  Some recent data  might be hidden    Immunizations and Health Maintenance Immunization History  Administered Date(s) Administered  . Influenza,inj,Quad PF,6+ Mos 01/24/2016, 09/02/2017  . Pneumococcal Conjugate-13 09/02/2017   Health Maintenance Due  Topic Date Due  . COLONOSCOPY  02/05/2001    Patient Care Team: Shawnee Knapp, MD as PCP - General (Family Medicine) Alyson Ingles Candee Furbish, MD as Consulting Physician (Urology)  Indicate any recent Medical Services you  may have received from other than [facility] providers in the past year (date may be approximate).    Assessment:   This is a routine wellness examination for Andrew [name].   Hearing/Vision screen Vision Screening Comments: Patient sees an eye doctor every 2 years for his regular eye exams.   Dietary issues and exercise activities discussed: Current Exercise Habits: Structured exercise class, Type of exercise: walking (bowling), Time (Minutes): > 60, Frequency (Times/Week): 3, Weekly Exercise (Minutes/Week): 0, Intensity: Mild, Exercise limited by: None identified  Goals    . Exercise 3x per week (30 min per time)          Patient states that he would like to exercise more weekly.       Depression Screen PHQ 2/9 Scores 09/02/2017 08/19/2017 06/29/2017 09/14/2016  PHQ - 2 Score 0 0 0 0    Fall Risk Fall Risk  09/02/2017 08/19/2017 06/29/2017 09/14/2016 01/24/2016  Falls in the past year? No No No No No    Cognitive Function:     6CIT Screen 09/02/2017  What Year? 0 points  What month? 0 points  What time? 0 points  Count back from 20 0 points  Months in reverse 0 points  Repeat phrase 0 points  Total Score 0    Screening Tests Health Maintenance  Topic Date Due  . COLONOSCOPY  02/05/2001  . TETANUS/TDAP  09/02/2018 (Originally 02/05/1970)  . PNA vac Low Risk Adult (2 of 2 - PPSV23) 09/02/2018  . INFLUENZA VACCINE  Completed  . Hepatitis C Screening  Completed        Plan:   I have personally reviewed and noted the following in the patient's chart:   . Medical and social history . Use of alcohol, tobacco or illicit drugs  . Current medications and supplements . Functional ability and status . Nutritional status . Physical activity . Advanced directives . List of other physicians . Hospitalizations, surgeries, and ER visits in previous 12 months . Vitals . Screenings to include cognitive, depression, and falls . Referrals and appointments  In addition, I have reviewed and  discussed with patient certain preventive protocols, quality metrics, and best practice recommendations. A written personalized care plan for preventive services as well as general preventive health recommendations were provided to patient.  Referral for colonoscopy ordered.   Andrez Grime, LPN   11/20/3233

## 2017-09-03 ENCOUNTER — Ambulatory Visit (INDEPENDENT_AMBULATORY_CARE_PROVIDER_SITE_OTHER): Payer: Medicare HMO | Admitting: Physician Assistant

## 2017-09-03 ENCOUNTER — Encounter: Payer: Self-pay | Admitting: Physician Assistant

## 2017-09-03 VITALS — BP 190/90 | HR 93 | Temp 98.5°F | Resp 16 | Ht 73.0 in | Wt 201.2 lb

## 2017-09-03 DIAGNOSIS — I700 Atherosclerosis of aorta: Secondary | ICD-10-CM | POA: Diagnosis not present

## 2017-09-03 DIAGNOSIS — I10 Essential (primary) hypertension: Secondary | ICD-10-CM | POA: Diagnosis not present

## 2017-09-03 DIAGNOSIS — J449 Chronic obstructive pulmonary disease, unspecified: Secondary | ICD-10-CM | POA: Diagnosis not present

## 2017-09-03 DIAGNOSIS — R030 Elevated blood-pressure reading, without diagnosis of hypertension: Secondary | ICD-10-CM

## 2017-09-03 DIAGNOSIS — R9431 Abnormal electrocardiogram [ECG] [EKG]: Secondary | ICD-10-CM | POA: Diagnosis not present

## 2017-09-03 LAB — POCT URINALYSIS DIP (MANUAL ENTRY)
Bilirubin, UA: NEGATIVE
Glucose, UA: NEGATIVE mg/dL
Leukocytes, UA: NEGATIVE
Nitrite, UA: NEGATIVE
Protein Ur, POC: 100 mg/dL — AB
Spec Grav, UA: 1.025 (ref 1.010–1.025)
Urobilinogen, UA: 0.2 U/dL
pH, UA: 5.5 (ref 5.0–8.0)

## 2017-09-03 LAB — POC MICROSCOPIC URINALYSIS (UMFC): Mucus: ABSENT

## 2017-09-03 MED ORDER — BISOPROLOL FUMARATE 5 MG PO TABS: 5.0000 mg | ORAL_TABLET | Freq: Every day | ORAL | 1 refills | Status: DC

## 2017-09-03 NOTE — Progress Notes (Signed)
Andrew [name]  MRN: [id number] DOB: 10-Oct-1951  PCP: Shawnee Knapp, MD  Subjective:  Pt is a 66 year old male PMH HTN, COPD, BPH, HLD, who presents to clinic for elevated blood pressure.   HTN - Today's blood pressure is 190/90. Last dose losartan was this morning. Takes Losartan 156m QD; denies missing any doses of his medications, denies medication side effects. He is taking blood pressure readings in the mornings at home - is getting similar readings recently. He is feeling well today. Denies headache, chest pain, shob, vision changes, palpitations.  He was previously prescribed Losartan-HCTZ 50-12.532mQD, but he had too many side effects (muscle cramps) so he was placed back on Losartan. He has been on Lisinopril in the past, which did not control his BP and was switched to Losartan. PCP is Dr. ShBrigitte Pulse last OV with Dr. ShBrigitte Pulseas 06/29/2017.     Current Exercise: bowling. Walks about 6,000 steps at work.   Drug use -  Denies Smoking - 33 pack-year history of smoking. CT chest for lung cancer screening 07/2017. Negative for carcinoma. + aortic atherosclerosis.  Increased recent alcohol intake - denies.  Sleep apnea - his wife says he snores when he is really tired, but not all the time. Has not heard that he ever stops breathing while sleeping. Denies waking up with headache or daytime somnolence.  Diet - Pork biscuit for breakfasts. Fish or pasta for dinner.   Review of Systems  Constitutional: Negative for chills, diaphoresis and fatigue.  Respiratory: Negative for cough, chest tightness, shortness of breath and wheezing.   Cardiovascular: Negative for chest pain, palpitations and leg swelling.  Gastrointestinal: Negative for diarrhea, nausea and vomiting.  Musculoskeletal: Negative for neck pain.  Neurological: Negative for dizziness, syncope, light-headedness and headaches.  Psychiatric/Behavioral: Negative for sleep disturbance. The patient is not nervous/anxious.     Patient  Active Problem List   Diagnosis Date Noted  . Tobacco abuse 08/20/2017  . COPD (chronic obstructive pulmonary disease) with emphysema ([place]09/19/2018  . Coronary atherosclerosis of native coronary artery 07/31/2017  . BPH (benign prostatic hyperplasia) 05/30/2016  . ALLERGIC RHINITIS 06/01/2008  . DENTAL CARIES 06/01/2008  . CONTACT DERMATITIS DUE TO POISON IVY 04/29/2008  . GERD 03/04/2008  . HYPERCHOLESTEROLEMIA 01/30/2008  . COCAINE ABUSE 01/30/2008  . Essential hypertension 01/30/2008  . PERIPHERAL VASCULAR DISEASE 01/30/2008  . ECZEMA 01/30/2008  . Folliculitis 0395/28/4132  Current Outpatient Prescriptions on File Prior to Visit  Medication Sig Dispense Refill  . albuterol (PROVENTIL HFA;VENTOLIN HFA) 108 (90 Base) MCG/ACT inhaler Inhale 2 puffs into the lungs every 4 (four) hours as needed for wheezing or shortness of breath (cough, shortness of breath or wheezing.). 1 Inhaler 11  . albuterol (PROVENTIL) (2.5 MG/3ML) 0.083% nebulizer solution Take 3 mLs (2.5 mg total) by nebulization every 6 (six) hours as needed for wheezing or shortness of breath. 150 mL 2  . cetirizine (ZYRTEC) 10 MG tablet TAKE 1 TABLET (10 MG TOTAL) BY MOUTH DAILY. 90 tablet 4  . fluticasone furoate-vilanterol (BREO ELLIPTA) 200-25 MCG/INH AEPB Inhale 1 puff into the lungs daily. 28 each 5  . ipratropium (ATROVENT) 0.02 % nebulizer solution Take 2.5 mLs (0.5 mg total) by nebulization 4 (four) times daily. 150 mL 2  . losartan (COZAAR) 100 MG tablet Take 1 tablet (100 mg total) by mouth daily. 90 tablet 0  . Omeprazole Magnesium (PRILOSEC OTC PO) Take by mouth.    . Triamcinolone Acetonide (TRIAMCINOLONE 0.1 %  CREAM : EUCERIN) CREA Apply 1 application topically 2 (two) times daily as needed. 1 each 4  . acetaminophen (TYLENOL) 500 MG tablet Take 500 mg by mouth every 6 (six) hours as needed.    Marland Kitchen losartan-hydrochlorothiazide (HYZAAR) 50-12.5 MG tablet Take 1 tablet by mouth daily. (Patient not taking:  Reported on 09/03/2017) 90 tablet 0  . pravastatin (PRAVACHOL) 40 MG tablet Take 1 tablet (40 mg total) by mouth daily. (Patient not taking: Reported on 09/03/2017) 90 tablet 0  . triamcinolone ointment (KENALOG) 0.1 % Apply 1 application topically 2 (two) times daily. (Patient not taking: Reported on 09/03/2017) 30 g 0  . zoster vaccine live, PF, (ZOSTAVAX) 89169 UNT/0.65ML injection Inject 19,400 Units into the skin once. (Patient not taking: Reported on 09/03/2017) 1 each 0   No current facility-administered medications on file prior to visit.     Allergies  Allergen Reactions  . Lisinopril Cough  . Lisinopril-Hydrochlorothiazide     dizziness     Objective:  BP (!) 180/92   Pulse 93   Temp 98.5 F (36.9 C) (Oral)   Resp 16   Ht _0  (1.854 m)   Wt 201 lb 3.2 oz (91.3 kg)   SpO2 97%   BMI 26.55 kg/m   Physical Exam  Constitutional: He is oriented to person, place, and time and well-developed, well-nourished, and in no distress. No distress.  Cardiovascular: Normal rate, regular rhythm and normal heart sounds.   Pulmonary/Chest: Effort normal. No respiratory distress.  Neurological: He is alert and oriented to person, place, and time. GCS score is 15.  Skin: Skin is warm and dry.  Psychiatric: Mood, memory, affect and judgment normal.  Vitals reviewed.   CT chest 07/2017 IMPRESSION: Aortic Atherosclerosis (ICD10-I70.0)  Lab Results  Component Value Date   CREATININE 1.62 (H) 07/21/2017   BUN 17 07/21/2017   NA 139 07/21/2017   K 4.3 07/21/2017   CL 107 07/21/2017   CO2 22 07/21/2017   Results for orders placed or performed in visit on 09/03/17  POCT urinalysis dipstick  Result Value Ref Range   Color, UA yellow yellow   Clarity, UA clear clear   Glucose, UA negative negative mg/dL   Bilirubin, UA negative negative   Ketones, POC UA trace (5) (A) negative mg/dL   Spec Grav, UA 1.025 1.010 - 1.025   Blood, UA moderate (A) negative   pH, UA 5.5 5.0 - 8.0    Protein Ur, POC =100 (A) negative mg/dL   Urobilinogen, UA 0.2 0.2 or 1.0 E.U./dL   Nitrite, UA Negative Negative   Leukocytes, UA Negative Negative  POCT Microscopic Urinalysis ([facility])  Result Value Ref Range   WBC,UR,HPF,POC None None WBC/hpf   RBC,UR,HPF,POC None None RBC/hpf   Bacteria Few (A) None, Too numerous to count   Mucus Absent Absent   Epithelial Cells, UR Per Microscopy None None, Too numerous to count cells/hpf    Assessment and Plan :  1. Elevated blood pressure reading 2. Essential hypertension - Recheck vitals - CMP14+EGFR - POCT urinalysis dipstick - POCT Microscopic Urinalysis ([facility]) - bisoprolol (ZEBETA) 5 MG tablet; Take 1 tablet (5 mg total) by mouth daily.  Dispense: 30 tablet; Refill: 1 - Pt presents with elevated blood pressures reading, he is asymptomatic. BP today is 190/90, with similar recent home BP readings.  Dr. Brigitte Pulse is his PCP, he no-showed for f/u appt with her recently. Discussed this case with Dr. Brigitte Pulse today. Plan to add Zebeta 45m, refer to  cards and pulm. Plan to order renal artery doppler if Crea is elevated - Crea has increased from 1.3 to 1.6 since starting Losartan.  F/u with Dr. Brigitte Pulse in 2 weeks. He understands and agrees with plan.  3. Atherosclerosis of aorta ([place]) 4. Nonspecific abnormal electrocardiogram (ECG) (EKG) - Ambulatory referral to Cardiology - Recent CT chest shows CAD and he has had a recent abnormal EKG.  5. Chronic obstructive pulmonary disease, unspecified COPD type ([place]) - Ambulatory referral to Pulmonology   Mercer Pod, PA-C  [facility] [place] 09/03/2017 8:59 AM

## 2017-09-03 NOTE — Patient Instructions (Addendum)
Continue taking Losartan 100 mg daily.  Start taking Bisoprolol 5 mg daily. May be administered without regard to meals.  Come back in 2 weeks to see Dr. Brigitte Pulse.  You will receive a phone call to schedule an appointment with cardiology and pulmonology.  Continue taking your blood pressures at home.  See below for DASH diet. Start exercising regularly.   DASH Eating Plan DASH stands for "Dietary Approaches to Stop Hypertension." The DASH eating plan is a healthy eating plan that has been shown to reduce high blood pressure (hypertension). It may also reduce your risk for type 2 diabetes, heart disease, and stroke. The DASH eating plan may also help with weight loss. What are tips for following this plan? General guidelines  Avoid eating more than 2,300 mg (milligrams) of salt (sodium) a day. If you have hypertension, you may need to reduce your sodium intake to 1,500 mg a day.  Limit alcohol intake to no more than 1 drink a day for nonpregnant women and 2 drinks a day for men. One drink equals 12 oz of beer, 5 oz of wine, or 1 oz of hard liquor.  Work with your health care provider to maintain a healthy body weight or to lose weight. Ask what an ideal weight is for you.  Get at least 30 minutes of exercise that causes your heart to beat faster (aerobic exercise) most days of the week. Activities may include walking, swimming, or biking.  Work with your health care provider or diet and nutrition specialist (dietitian) to adjust your eating plan to your individual calorie needs. Reading food labels  Check food labels for the amount of sodium per serving. Choose foods with less than 5 percent of the Daily Value of sodium. Generally, foods with less than 300 mg of sodium per serving fit into this eating plan.  To find whole grains, look for the word "whole" as the first word in the ingredient list. Shopping  Buy products labeled as "low-sodium" or "no salt added."  Buy fresh foods. Avoid  canned foods and premade or frozen meals. Cooking  Avoid adding salt when cooking. Use salt-free seasonings or herbs instead of table salt or sea salt. Check with your health care provider or pharmacist before using salt substitutes.  Do not fry foods. Cook foods using healthy methods such as baking, boiling, grilling, and broiling instead.  Cook with heart-healthy oils, such as olive, canola, soybean, or sunflower oil. Meal planning   Eat a balanced diet that includes: ? 5 or more servings of fruits and vegetables each day. At each meal, try to fill half of your plate with fruits and vegetables. ? Up to 6-8 servings of whole grains each day. ? Less than 6 oz of lean meat, poultry, or fish each day. A 3-oz serving of meat is about the same size as a deck of cards. One egg equals 1 oz. ? 2 servings of low-fat dairy each day. ? A serving of nuts, seeds, or beans 5 times each week. ? Heart-healthy fats. Healthy fats called Omega-3 fatty acids are found in foods such as flaxseeds and coldwater fish, like sardines, salmon, and mackerel.  Limit how much you eat of the following: ? Canned or prepackaged foods. ? Food that is high in trans fat, such as fried foods. ? Food that is high in saturated fat, such as fatty meat. ? Sweets, desserts, sugary drinks, and other foods with added sugar. ? Full-fat dairy products.  Do not salt foods  before eating.  Try to eat at least 2 vegetarian meals each week.  Eat more home-cooked food and less restaurant, buffet, and fast food.  When eating at a restaurant, ask that your food be prepared with less salt or no salt, if possible. What foods are recommended? The items listed may not be a complete list. Talk with your dietitian about what dietary choices are best for you. Grains Whole-grain or whole-wheat bread. Whole-grain or whole-wheat pasta. Brown rice. [place]row. Bulgur. Whole-grain and low-sodium cereals. Pita bread. Low-fat, low-sodium  crackers. Whole-wheat flour tortillas. Vegetables Fresh or frozen vegetables (raw, steamed, roasted, or grilled). Low-sodium or reduced-sodium tomato and vegetable juice. Low-sodium or reduced-sodium tomato sauce and tomato paste. Low-sodium or reduced-sodium canned vegetables. Fruits All fresh, dried, or frozen fruit. Canned fruit in natural juice (without added sugar). Meat and other protein foods Skinless chicken or [place]. Ground chicken or [place]. Pork with fat trimmed off. Fish and seafood. Egg whites. Dried beans, peas, or lentils. Unsalted nuts, nut butters, and seeds. Unsalted canned beans. Lean cuts of beef with fat trimmed off. Low-sodium, lean deli meat. Dairy Low-fat (1%) or fat-free (skim) milk. Fat-free, low-fat, or reduced-fat cheeses. Nonfat, low-sodium ricotta or cottage cheese. Low-fat or nonfat yogurt. Low-fat, low-sodium cheese. Fats and oils Soft margarine without trans fats. Vegetable oil. Low-fat, reduced-fat, or light mayonnaise and salad dressings (reduced-sodium). Canola, safflower, olive, soybean, and sunflower oils. Avocado. Seasoning and other foods Herbs. Spices. Seasoning mixes without salt. Unsalted popcorn and pretzels. Fat-free sweets. What foods are not recommended? The items listed may not be a complete list. Talk with your dietitian about what dietary choices are best for you. Grains Baked goods made with fat, such as croissants, muffins, or some breads. Dry pasta or rice meal packs. Vegetables Creamed or fried vegetables. Vegetables in a cheese sauce. Regular canned vegetables (not low-sodium or reduced-sodium). Regular canned tomato sauce and paste (not low-sodium or reduced-sodium). Regular tomato and vegetable juice (not low-sodium or reduced-sodium). Angie Fava. Olives. Fruits Canned fruit in a light or heavy syrup. Fried fruit. Fruit in cream or butter sauce. Meat and other protein foods Fatty cuts of meat. Ribs. Fried meat. Berniece Salines. Sausage. Bologna and  other processed lunch meats. Salami. Fatback. Hotdogs. Bratwurst. Salted nuts and seeds. Canned beans with added salt. Canned or smoked fish. Whole eggs or egg yolks. Chicken or [place] with skin. Dairy Whole or 2% milk, cream, and half-and-half. Whole or full-fat cream cheese. Whole-fat or sweetened yogurt. Full-fat cheese. Nondairy creamers. Whipped toppings. Processed cheese and cheese spreads. Fats and oils Butter. Stick margarine. Lard. Shortening. Ghee. Bacon fat. Tropical oils, such as coconut, palm kernel, or palm oil. Seasoning and other foods Salted popcorn and pretzels. Onion salt, garlic salt, seasoned salt, table salt, and sea salt. Worcestershire sauce. Tartar sauce. Barbecue sauce. Teriyaki sauce. Soy sauce, including reduced-sodium. Steak sauce. Canned and packaged gravies. Fish sauce. Oyster sauce. Cocktail sauce. Horseradish that you find on the shelf. Ketchup. Mustard. Meat flavorings and tenderizers. Bouillon cubes. Hot sauce and Tabasco sauce. Premade or packaged marinades. Premade or packaged taco seasonings. Relishes. Regular salad dressings. Where to find more information:  National Heart, Lung, and [place]: https://wilson-eaton.com/  American Heart Association: www.heart.org Summary  The DASH eating plan is a healthy eating plan that has been shown to reduce high blood pressure (hypertension). It may also reduce your risk for type 2 diabetes, heart disease, and stroke.  With the DASH eating plan, you should limit salt (sodium) intake to 2,300 mg  a day. If you have hypertension, you may need to reduce your sodium intake to 1,500 mg a day.  When on the DASH eating plan, aim to eat more fresh fruits and vegetables, whole grains, lean proteins, low-fat dairy, and heart-healthy fats.  Work with your health care provider or diet and nutrition specialist (dietitian) to adjust your eating plan to your individual calorie needs. This information is not intended to replace advice  given to you by your health care provider. Make sure you discuss any questions you have with your health care provider. Document Released: 10/18/2011 Document Revised: 10/22/2016 Document Reviewed: 10/22/2016 Elsevier Interactive Patient Education  2017 [employer].   IF you received an x-ray today, you will receive an invoice from [facility]. Please contact [facility] at [phone] with questions or concerns regarding your invoice.   IF you received labwork today, you will receive an invoice from [facility]. Please contact [facility] at [phone] with questions or concerns regarding your invoice.   Our billing staff will not be able to assist you with questions regarding bills from these companies.  You will be contacted with the lab results as soon as they are available. The fastest way to get your results is to activate your My Chart account. Instructions are located on the last page of this paperwork. If you have not heard from [place] regarding the results in 2 weeks, please contact this office.

## 2017-09-04 LAB — CMP14+EGFR
ALT: 16 IU/L (ref 0–44)
AST: 19 IU/L (ref 0–40)
Albumin/Globulin Ratio: 1.9 (ref 1.2–2.2)
Albumin: 4.9 g/dL — ABNORMAL HIGH (ref 3.6–4.8)
Alkaline Phosphatase: 151 IU/L — ABNORMAL HIGH (ref 39–117)
BUN/Creatinine Ratio: 11 (ref 10–24)
BUN: 14 mg/dL (ref 8–27)
Bilirubin Total: 0.7 mg/dL (ref 0.0–1.2)
CO2: 20 mmol/L (ref 20–29)
Calcium: 9.9 mg/dL (ref 8.6–10.2)
Chloride: 105 mmol/L (ref 96–106)
Creatinine, Ser: 1.27 mg/dL (ref 0.76–1.27)
GFR calc Af Amer: 68 mL/min/{1.73_m2} (ref >59)
GFR calc non Af Amer: 58 mL/min/{1.73_m2} — ABNORMAL LOW (ref >59)
Globulin, Total: 2.6 g/dL (ref 1.5–4.5)
Glucose: 88 mg/dL (ref 65–99)
Potassium: 3.7 mmol/L (ref 3.5–5.2)
Sodium: 142 mmol/L (ref 134–144)
Total Protein: 7.5 g/dL (ref 6.0–8.5)

## 2017-09-06 DIAGNOSIS — J449 Chronic obstructive pulmonary disease, unspecified: Secondary | ICD-10-CM | POA: Diagnosis not present

## 2017-09-09 ENCOUNTER — Ambulatory Visit: Payer: Medicare HMO | Admitting: Family Medicine

## 2017-09-12 ENCOUNTER — Telehealth: Payer: Self-pay | Admitting: Physician Assistant

## 2017-09-12 NOTE — Telephone Encounter (Signed)
Pt is calling to have someone go over his lab results with him.  He had gotten a few notifications from [facility] that lab results were in but he doesn't understand how to get on and look at them.  Please advise [phone]

## 2017-09-19 ENCOUNTER — Other Ambulatory Visit: Payer: Self-pay

## 2017-09-19 ENCOUNTER — Encounter: Payer: Self-pay | Admitting: Family Medicine

## 2017-09-19 ENCOUNTER — Ambulatory Visit: Payer: Medicare HMO | Admitting: Family Medicine

## 2017-09-19 VITALS — BP 166/86 | HR 69 | Temp 98.7°F | Resp 16 | Ht 73.0 in | Wt 202.0 lb

## 2017-09-19 DIAGNOSIS — N401 Enlarged prostate with lower urinary tract symptoms: Secondary | ICD-10-CM | POA: Diagnosis not present

## 2017-09-19 DIAGNOSIS — J449 Chronic obstructive pulmonary disease, unspecified: Secondary | ICD-10-CM | POA: Diagnosis not present

## 2017-09-19 DIAGNOSIS — F17210 Nicotine dependence, cigarettes, uncomplicated: Secondary | ICD-10-CM

## 2017-09-19 DIAGNOSIS — R350 Frequency of micturition: Secondary | ICD-10-CM

## 2017-09-19 DIAGNOSIS — I10 Essential (primary) hypertension: Secondary | ICD-10-CM | POA: Diagnosis not present

## 2017-09-19 MED ORDER — AMLODIPINE BESYLATE 5 MG PO TABS: 5.0000 mg | ORAL_TABLET | Freq: Every day | ORAL | 0 refills | Status: DC

## 2017-09-19 NOTE — Patient Instructions (Addendum)
Call [facility]  Address: [place], [place], [place] [address] Phone: [phone]  To schedule the referral appointment.    IF you received an x-ray today, you will receive an invoice from [facility]. Please contact [facility] at [phone] with questions or concerns regarding your invoice.   IF you received labwork today, you will receive an invoice from [facility]. Please contact [facility] at [phone] with questions or concerns regarding your invoice.   Our billing staff will not be able to assist you with questions regarding bills from these companies.  You will be contacted with the lab results as soon as they are available. The fastest way to get your results is to activate your My Chart account. Instructions are located on the last page of this paperwork. If you have not heard from [place] regarding the results in 2 weeks, please contact this office.      Managing Your Hypertension Hypertension is commonly called high blood pressure. This is when the force of your blood pressing against the walls of your arteries is too strong. Arteries are blood vessels that carry blood from your heart throughout your body. Hypertension forces the heart to work harder to pump blood, and may cause the arteries to become narrow or stiff. Having untreated or uncontrolled hypertension can cause heart attack, stroke, kidney disease, and other problems. What are blood pressure readings? A blood pressure reading consists of a higher number over a lower number. Ideally, your blood pressure should be below 120/80. The first ("top") number is called the systolic pressure. It is a measure of the pressure in your arteries as your heart beats. The second ("bottom") number is called the diastolic pressure. It is a measure of the pressure in your arteries as the heart relaxes. What does my blood pressure reading mean? Blood pressure is classified into four stages. Based on your  blood pressure reading, your health care provider may use the following stages to determine what type of treatment you need, if any. Systolic pressure and diastolic pressure are measured in a unit called mm Hg. Normal  Systolic pressure: below 614.  Diastolic pressure: below 80. Elevated  Systolic pressure: 431-540.  Diastolic pressure: below 80. Hypertension stage 1  Systolic pressure: 086-761.  Diastolic pressure: 95-09. Hypertension stage 2  Systolic pressure: 326 or above.  Diastolic pressure: 90 or above. What health risks are associated with hypertension? Managing your hypertension is an important responsibility. Uncontrolled hypertension can lead to:  A heart attack.  A stroke.  A weakened blood vessel (aneurysm).  Heart failure.  Kidney damage.  Eye damage.  Metabolic syndrome.  Memory and concentration problems.  What changes can I make to manage my hypertension? Hypertension can be managed by making lifestyle changes and possibly by taking medicines. Your health care provider will help you make a plan to bring your blood pressure within a normal range. Eating and drinking  Eat a diet that is high in fiber and potassium, and low in salt (sodium), added sugar, and fat. An example eating plan is called the DASH (Dietary Approaches to Stop Hypertension) diet. To eat this way: ? Eat plenty of fresh fruits and vegetables. Try to fill half of your plate at each meal with fruits and vegetables. ? Eat whole grains, such as whole wheat pasta, brown rice, or whole grain bread. Fill about one quarter of your plate with whole grains. ? Eat low-fat diary products. ? Avoid fatty cuts of meat, processed or cured meats, and  poultry with skin. Fill about one quarter of your plate with lean proteins such as fish, chicken without skin, beans, eggs, and tofu. ? Avoid premade and processed foods. These tend to be higher in sodium, added sugar, and fat.  Reduce your daily sodium  intake. Most people with hypertension should eat less than 1,500 mg of sodium a day.  Limit alcohol intake to no more than 1 drink a day for nonpregnant women and 2 drinks a day for men. One drink equals 12 oz of beer, 5 oz of wine, or 1 oz of hard liquor. Lifestyle  Work with your health care provider to maintain a healthy body weight, or to lose weight. Ask what an ideal weight is for you.  Get at least 30 minutes of exercise that causes your heart to beat faster (aerobic exercise) most days of the week. Activities may include walking, swimming, or biking.  Include exercise to strengthen your muscles (resistance exercise), such as weight lifting, as part of your weekly exercise routine. Try to do these types of exercises for 30 minutes at least 3 days a week.  Do not use any products that contain nicotine or tobacco, such as cigarettes and e-cigarettes. If you need help quitting, ask your health care provider.  Control any long-term (chronic) conditions you have, such as high cholesterol or diabetes. Monitoring  Monitor your blood pressure at home as told by your health care provider. Your personal target blood pressure may vary depending on your medical conditions, your age, and other factors.  Have your blood pressure checked regularly, as often as told by your health care provider. Working with your health care provider  Review all the medicines you take with your health care provider because there may be side effects or interactions.  Talk with your health care provider about your diet, exercise habits, and other lifestyle factors that may be contributing to hypertension.  Visit your health care provider regularly. Your health care provider can help you create and adjust your plan for managing hypertension. Will I need medicine to control my blood pressure? Your health care provider may prescribe medicine if lifestyle changes are not enough to get your blood pressure under control,  and if:  Your systolic blood pressure is 130 or higher.  Your diastolic blood pressure is 80 or higher.  Take medicines only as told by your health care provider. Follow the directions carefully. Blood pressure medicines must be taken as prescribed. The medicine does not work as well when you skip doses. Skipping doses also puts you at risk for problems. Contact a health care provider if:  You think you are having a reaction to medicines you have taken.  You have repeated (recurrent) headaches.  You feel dizzy.  You have swelling in your ankles.  You have trouble with your vision. Get help right away if:  You develop a severe headache or confusion.  You have unusual weakness or numbness, or you feel faint.  You have severe pain in your chest or abdomen.  You vomit repeatedly.  You have trouble breathing. Summary  Hypertension is when the force of blood pumping through your arteries is too strong. If this condition is not controlled, it may put you at risk for serious complications.  Your personal target blood pressure may vary depending on your medical conditions, your age, and other factors. For most people, a normal blood pressure is less than 120/80.  Hypertension is managed by lifestyle changes, medicines, or both. Lifestyle changes  include weight loss, eating a healthy, low-sodium diet, exercising more, and limiting alcohol. This information is not intended to replace advice given to you by your health care provider. Make sure you discuss any questions you have with your health care provider. Document Released: 07/23/2012 Document Revised: 09/26/2016 Document Reviewed: 09/26/2016 Elsevier Interactive Patient Education  [employer].

## 2017-09-19 NOTE — Progress Notes (Signed)
Subjective:    Patient ID: Andrew [name], male    DOB: Mar 22, 1951, 66 y.o.   MRN: [id number]  HPI Andrew [name] is a 66 y.o. male who presents to Primary Care at [facility] for recheck of his uncontrolled HTN COPD, HLD.  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  HTN: On losartan. 2 mos prior, started hctz 12.5 but pt could not tolerate due to muscle cramps of toes, fingers, legs. (had also been tried on this 01/2016). Encouraged pt to try to stay on the losartan-hctz and increase water and K containing foods but sxs continued to d/c'd hctz and losartan increased to 19m on 9/19 Baseline Cr 1.33 ->1.41 -> 1.6 with EGFR of 64 ->60->49 --> has NOT been rechecked since losartan was increased. hctz was added at the middle value (Cr. 1.41; eGFR 60) The bisoprolol was added and was still 170-180/75-80.  It is usually when  Notes urinary urgency and hesirancy  HLD: Lipid panel improving without meds meds but was still far above goal (LDL 162, non-HDL 179) so started on pravastatin 40 at last visit 3 mos prior anyway due to risk factors. However, he was having cramps in all his extremities. (rx'd lipitor prior but never filled due to cost).  COPD: Sxs seem to have worsened over the fall w/ more flaires. Trx'd w/ pred 40 x5d at our last visit < 2 mos ago as was needing to use alb inhaler in middle of night and upon awakening. 3 wks later pt was seen in ER for recurrence and treated w/ pred 60 qd x 5d after which I rx'd pt a home nebulizer machine (through [facility]) and albuterol and ipratropium nebs.  Has difficulty staying on symbicort (had gone off for sev wks at last visit) due to financial concerns so tried changing to BMossyrockwith coupon. Chest CT 1 mo prior showed mild centrilobular emphysematous changes, upper lobe predominant. Does have home neb but asn't needed.  Currently on symbicort with prn  ventolin.  Tobacco use: Ongoing; trying to cut down - at visit 2 mos ago was at a pack every 3-4 days and at lung cancer screening 1 mo proir stated he was down to 1-2 cigs/d but not yet ready to set a quit date. Pt has never tried medication for cessation, but open to it. Had screening chest CT 1 mo prior w/o area of concern for lung ca. Has cut down to 1 cig/mo but is around it all day.  CAD: Chest CT showed coronary atherosclerosis in the LAD and RCA - unable to judge severity. baseline EKG abnml  Seeing Dr. MAlyson Ingles urology, who did 2 biopsies but urine sxs just started recntly. Has had some pain in his knees some.      Review of Systems     Objective:   Physical Exam   BP (!) 166/86   Pulse 69   Temp 98.7 F (37.1 C)   Resp 16   Ht '6\' 1"'  (1.854 m)   Wt 202 lb (91.6 kg)   SpO2 99%   BMI 26.65 kg/m       Assessment & Plan:  Cmp, ua Spirometry?? - none prior. Cards eval??? - baseline EKG abnml and CAD on CT Pulm eval??  1. Urinary frequency   2. Essential hypertension   3. COPD GOLD 3 still smoking    4. Cigarette smoker  5. Benign prostatic hyperplasia with urinary frequency     Orders Placed This Encounter  Procedures  . Urine Culture  . PSA  . Basic metabolic panel    Order Specific Question:   Has the patient fasted?    Answer:   No  . POCT urinalysis dipstick  . POCT Microscopic Urinalysis ([facility])    Meds ordered this encounter  Medications  . amLODipine (NORVASC) 5 MG tablet    Sig: Take 1 tablet (5 mg total) daily by mouth.    Dispense:  90 tablet    Refill:  0    Delman Cheadle, M.D.  Primary Care at [facility] [address] [place], [place] [address] [phone] phone [phone] fax  09/21/17 11:50 PM

## 2017-09-20 ENCOUNTER — Ambulatory Visit: Payer: Medicare HMO | Admitting: Internal Medicine

## 2017-09-20 ENCOUNTER — Encounter: Payer: Self-pay | Admitting: Internal Medicine

## 2017-09-20 VITALS — BP 166/84 | HR 60 | Ht 73.5 in | Wt 202.0 lb

## 2017-09-20 DIAGNOSIS — F17210 Nicotine dependence, cigarettes, uncomplicated: Secondary | ICD-10-CM

## 2017-09-20 DIAGNOSIS — J449 Chronic obstructive pulmonary disease, unspecified: Secondary | ICD-10-CM

## 2017-09-20 LAB — BASIC METABOLIC PANEL WITH GFR
BUN/Creatinine Ratio: 10 (ref 10–24)
BUN: 14 mg/dL (ref 8–27)
CO2: 24 mmol/L (ref 20–29)
Calcium: 9.3 mg/dL (ref 8.6–10.2)
Chloride: 104 mmol/L (ref 96–106)
Creatinine, Ser: 1.36 mg/dL — ABNORMAL HIGH (ref 0.76–1.27)
GFR calc Af Amer: 62 mL/min/1.73 (ref >59)
GFR calc non Af Amer: 54 mL/min/1.73 — ABNORMAL LOW (ref >59)
Glucose: 89 mg/dL (ref 65–99)
Potassium: 4.4 mmol/L (ref 3.5–5.2)
Sodium: 141 mmol/L (ref 134–144)

## 2017-09-20 LAB — PSA: Prostate Specific Ag, Serum: 6.6 ng/mL — ABNORMAL HIGH (ref 0.0–4.0)

## 2017-09-20 MED ORDER — MOMETASONE FURO-FORMOTEROL FUM 200-5 MCG/ACT IN AERO: 2.0000 | INHALATION_SPRAY | Freq: Two times a day (BID) | RESPIRATORY_TRACT | 0 refills | Status: DC

## 2017-09-20 MED ORDER — MOMETASONE FURO-FORMOTEROL FUM 200-5 MCG/ACT IN AERO: 2.0000 | INHALATION_SPRAY | Freq: Two times a day (BID) | RESPIRATORY_TRACT | 11 refills | Status: DC

## 2017-09-20 NOTE — Assessment & Plan Note (Signed)
07/19/17 CT Mild centrilobular emphysematous changes, upper lobe predominant. - Spirometry 09/20/2017  FEV1 1.45 (42%)  Ratio 50 with typical curvature > dulera 200 (humana) 2bid     Clearly improved symptomatically on symb ? Strength and cutting down on smoking (see separate a/p)     I reviewed the Fletcher curve with the patient that basically indicates  if you quit smoking when your best day FEV1 is still well preserved (as is clearly  the case here)  it is highly unlikely you will progress to severe disease and informed the patient there was  no medication on the market that has proven to alter the curve/ its downward trajectory  or the likelihood of progression of their disease(unlike other chronic medical conditions such as atheroclerosis where we do think we can change the natural hx with risk reducing meds)    Therefore stopping smoking and maintaining abstinence is the most important aspect of care, not choice of inhalers or for that matter, doctors.    rx is for symptoms (resolved on symb) and reduce exac (accomplished so far) not to change the natural hx of the dz so ok to use dulera 200 2bid per his insurance for now and f/u in 6 weeks with full pfts    Formulary restrictions will be an ongoing challenge for the forseable future and I would be happy to pick an alternative if the pt will first  provide me a list of them but pt  will need to return here for training for any new device that is required eg dpi vs hfa vs respimat.    In meantime we can always provide samples so the patient never runs out of any needed respiratory medications.    Total time devoted to counseling  > 50 % of initial 60 min office visit:  review case with pt/ discussion of options/alternatives/ personally creating written customized instructions  in presence of pt  then going over those specific  Instructions directly with the pt including how to use all of the meds but in particular covering each new medication in  detail and the difference between the maintenance= "automatic" meds and the prns using an action plan format for the latter (If this problem/symptom => do that organization reading Left to right).  Please see AVS from this visit for a full list of these instructions which I personally wrote for this pt and  are unique to this visit.

## 2017-09-20 NOTE — Progress Notes (Signed)
Subjective:     Patient ID: Andrew [name], male   DOB: [birth year],    MRN: [id number]  HPI   84 yobm active smoker referred to pulmonary clinic 09/20/2017 by Dr   Ranee Gosselin for [place] eval    09/20/2017 1st [facility] office visit/ Andrew [name]   Chief Complaint  Patient presents with  . Pulmonary Consult    Referred by [name] for eval of COPD. Pt states his breathing is currently at baseline. He uses an albuterol inhaler 2-3 x per wk and has atrovent and albuterol nebs that he rarely uses.    exac 07/21/17 did not req admit with rec stating on Breo 200 denies taking too expensive so started symb ? Strength p er rx but costs 300 per month   Baseline doe now on rx = best days Not limited by breathing from desired activities lots of fast pace walking at [employer]/ some hills ok  Flares assoc with congested cough last happened 2 m prior but has cut down on smoking since   No obvious day to day or daytime variability or assoc excess/ purulent sputum or mucus plugs or hemoptysis or cp or chest tightness, subjective wheeze or overt sinus or hb symptoms. No unusual exp hx or h/o childhood pna/ asthma or knowledge of premature birth.  Sleeping ok flat without nocturnal  or early am exacerbation  of respiratory  c/o's or need for noct saba. Also denies any obvious fluctuation of symptoms with weather or environmental changes or other aggravating or alleviating factors except as outlined above   Current Allergies, Complete Past Medical History, Past Surgical History, Family History, and Social History were reviewed in [employer] record.  ROS  The following are not active complaints unless bolded Hoarseness, sore throat, dysphagia, dental problems, itching, sneezing,  nasal congestion or discharge of excess mucus or purulent secretions, ear ache,   fever, chills, sweats, unintended wt loss or wt gain, classically pleuritic or exertional cp,  orthopnea pnd or leg  swelling, presyncope, palpitations, abdominal pain, anorexia, nausea, vomiting, diarrhea  or change in bowel habits or change in bladder habits, change in stools or change in urine, dysuria, hematuria,  rash, arthralgias, visual complaints, headache, numbness, weakness or ataxia or problems with walking or coordination,  change in mood/affect or memory.        Current Meds  Medication Sig  . acetaminophen (TYLENOL) 500 MG tablet Take 500 mg by mouth every 6 (six) hours as needed.  Marland Kitchen albuterol (PROVENTIL HFA;VENTOLIN HFA) 108 (90 Base) MCG/ACT inhaler Inhale 2 puffs into the lungs every 4 (four) hours as needed for wheezing or shortness of breath (cough, shortness of breath or wheezing.).  Marland Kitchen albuterol (PROVENTIL) (2.5 MG/3ML) 0.083% nebulizer solution Take 3 mLs (2.5 mg total) by nebulization every 6 (six) hours as needed for wheezing or shortness of breath.  Marland Kitchen amLODipine (NORVASC) 5 MG tablet Take 1 tablet (5 mg total) daily by mouth.  . bisoprolol (ZEBETA) 5 MG tablet Take 1 tablet (5 mg total) by mouth daily.  Marland Kitchen ipratropium (ATROVENT) 0.02 % nebulizer solution Take 2.5 mLs (0.5 mg total) by nebulization 4 (four) times daily. (Patient taking differently: Take 0.5 mg every 4 (four) hours as needed by nebulization. )  . losartan (COZAAR) 100 MG tablet Take 1 tablet (100 mg total) by mouth daily.  . Omeprazole Magnesium (PRILOSEC OTC PO) Take 1 tablet daily as needed by mouth.   . Triamcinolone Acetonide (TRIAMCINOLONE 0.1 %  CREAM : EUCERIN) CREA Apply 1 application topically 2 (two) times daily as needed.           Review of Systems     Objective:   Physical Exam   amb pleasant wm nad  Wt Readings from Last 3 Encounters:  09/20/17 202 lb (91.6 kg)  09/19/17 202 lb (91.6 kg)  09/03/17 201 lb 3.2 oz (91.3 kg)    Vital signs reviewed   HEENT: nl   turbinates bilaterally, and oropharynx. Nl external ear canals without cough reflex - only two teeth left and one of those is broken off -  full upper denture   NECK :  without JVD/Nodes/TM/ nl carotid upstrokes bilaterally   LUNGS: no acc muscle use,  Nl contour chest which is clear to A and P bilaterally without cough on insp or exp maneuvers   CV:  RRR  no s3 or murmur or increase in P2, and no edema   ABD:  soft and nontender with nl inspiratory excursion in the supine position. No bruits or organomegaly appreciated, bowel sounds nl  MS:  Nl gait/ ext warm without deformities, calf tenderness, cyanosis or clubbing No obvious joint restrictions   SKIN: warm and dry without lesions    NEURO:  alert, approp, nl sensorium with  no motor or cerebellar deficits apparent.      I personally reviewed images and agree with radiology impression as follows:   Chest CT low dose Screening 07/19/17 Mild centrilobular emphysematous changes, upper lobe predominant. Lung-RADS 1, negative      Assessment:

## 2017-09-20 NOTE — Assessment & Plan Note (Addendum)
 >   3 min Discussed the risks and costs (both direct and indirect)  of smoking relative to the benefits of quitting but patient unwilling to commit at this point to a specific quit date.    Although I don't endorse regular use of e cigs/ many pts find them helpful; however, I emphasized they should be considered a "one-way bridge" off all tobacco products > Follow up per Primary Care planned

## 2017-09-20 NOTE — Patient Instructions (Signed)
Plan A = Automatic = symbiocort 160=dulera 200 Take 2 puffs first thing in am and then another 2 puffs about 12 hours later.    Work on inhaler technique:  relax and gently blow all the way out then take a nice smooth deep breath back in, triggering the inhaler at same time you start breathing in.  Hold for up to 5 seconds if you can. Blow out thru nose. Rinse and gargle with water when done      Plan B = Backup Only use your albuterol as a rescue medication to be used if you can't catch your breath by resting or doing a relaxed purse lip breathing pattern.  - The less you use it, the better it will work when you need it. - Ok to use the inhaler up to 2 puffs  every 4 hours if you must but call for appointment if use goes up over your usual need - Don't leave home without it !!  (think of it like the spare tire for your car)   Plan C = Crisis - only use your albuterol nebulizer if you first try Plan B and it fails to help > ok to use the nebulizer up to every 4 hours but if start needing it regularly call for immediate appointment   Plan D = Doctor - call me if B and C not adequate  Plan E = ER - go to ER or call 911 if all else fails     The key is to stop smoking completely before smoking completely stops you!    Please schedule a follow up office visit in 6 weeks, call sooner if needed pfts on return

## 2017-10-07 ENCOUNTER — Encounter: Payer: Self-pay | Admitting: Family Medicine

## 2017-10-07 DIAGNOSIS — J449 Chronic obstructive pulmonary disease, unspecified: Secondary | ICD-10-CM | POA: Diagnosis not present

## 2017-10-17 ENCOUNTER — Other Ambulatory Visit: Payer: Self-pay

## 2017-10-17 ENCOUNTER — Encounter: Payer: Self-pay | Admitting: Family Medicine

## 2017-10-17 ENCOUNTER — Ambulatory Visit: Payer: Medicare HMO | Admitting: Family Medicine

## 2017-10-17 VITALS — BP 158/84 | HR 80 | Temp 98.3°F | Resp 16 | Ht 73.5 in | Wt 203.6 lb

## 2017-10-17 DIAGNOSIS — I10 Essential (primary) hypertension: Secondary | ICD-10-CM

## 2017-10-17 DIAGNOSIS — R3129 Other microscopic hematuria: Secondary | ICD-10-CM

## 2017-10-17 LAB — POCT URINALYSIS DIP (MANUAL ENTRY)
BILIRUBIN UA: NEGATIVE
BILIRUBIN UA: NEGATIVE mg/dL
GLUCOSE UA: NEGATIVE mg/dL
Leukocytes, UA: NEGATIVE
Nitrite, UA: NEGATIVE
PROTEIN UA: NEGATIVE mg/dL
Spec Grav, UA: 1.02 (ref 1.010–1.025)
Urobilinogen, UA: 0.2 E.U./dL
pH, UA: 5.5 (ref 5.0–8.0)

## 2017-10-17 LAB — POC MICROSCOPIC URINALYSIS (UMFC): MUCUS RE: ABSENT

## 2017-10-17 MED ORDER — AMLODIPINE-OLMESARTAN 10-40 MG PO TABS: 1.0000 | ORAL_TABLET | Freq: Every day | ORAL | 0 refills | Status: DC

## 2017-10-17 NOTE — Patient Instructions (Addendum)
USER NAME: ROBERTSANDERS PASSWORD:     IF you received an x-ray today, you will receive an invoice from [facility]. Please contact [facility] at [phone] with questions or concerns regarding your invoice.   IF you received labwork today, you will receive an invoice from [facility]. Please contact [facility] at [phone] with questions or concerns regarding your invoice.   Our billing staff will not be able to assist you with questions regarding bills from these companies.  You will be contacted with the lab results as soon as they are available. The fastest way to get your results is to activate your My Chart account. Instructions are located on the last page of this paperwork. If you have not heard from [place] regarding the results in 2 weeks, please contact this office.      Managing Your Hypertension Hypertension is commonly called high blood pressure. This is when the force of your blood pressing against the walls of your arteries is too strong. Arteries are blood vessels that carry blood from your heart throughout your body. Hypertension forces the heart to work harder to pump blood, and may cause the arteries to become narrow or stiff. Having untreated or uncontrolled hypertension can cause heart attack, stroke, kidney disease, and other problems. What are blood pressure readings? A blood pressure reading consists of a higher number over a lower number. Ideally, your blood pressure should be below 120/80. The first ("top") number is called the systolic pressure. It is a measure of the pressure in your arteries as your heart beats. The second ("bottom") number is called the diastolic pressure. It is a measure of the pressure in your arteries as the heart relaxes. What does my blood pressure reading mean? Blood pressure is classified into four stages. Based on your blood pressure reading, your health care provider may use the following stages to determine what type of  treatment you need, if any. Systolic pressure and diastolic pressure are measured in a unit called mm Hg. Normal  Systolic pressure: below 710.  Diastolic pressure: below 80. Elevated  Systolic pressure: 626-948.  Diastolic pressure: below 80. Hypertension stage 1  Systolic pressure: 546-270.  Diastolic pressure: 35-00. Hypertension stage 2  Systolic pressure: 938 or above.  Diastolic pressure: 90 or above. What health risks are associated with hypertension? Managing your hypertension is an important responsibility. Uncontrolled hypertension can lead to:  A heart attack.  A stroke.  A weakened blood vessel (aneurysm).  Heart failure.  Kidney damage.  Eye damage.  Metabolic syndrome.  Memory and concentration problems.  What changes can I make to manage my hypertension? Hypertension can be managed by making lifestyle changes and possibly by taking medicines. Your health care provider will help you make a plan to bring your blood pressure within a normal range. Eating and drinking  Eat a diet that is high in fiber and potassium, and low in salt (sodium), added sugar, and fat. An example eating plan is called the DASH (Dietary Approaches to Stop Hypertension) diet. To eat this way: ? Eat plenty of fresh fruits and vegetables. Try to fill half of your plate at each meal with fruits and vegetables. ? Eat whole grains, such as whole wheat pasta, brown rice, or whole grain bread. Fill about one quarter of your plate with whole grains. ? Eat low-fat diary products. ? Avoid fatty cuts of meat, processed or cured meats, and poultry with skin. Fill about one quarter of your plate with lean proteins such as fish,  chicken without skin, beans, eggs, and tofu. ? Avoid premade and processed foods. These tend to be higher in sodium, added sugar, and fat.  Reduce your daily sodium intake. Most people with hypertension should eat less than 1,500 mg of sodium a day.  Limit alcohol  intake to no more than 1 drink a day for nonpregnant women and 2 drinks a day for men. One drink equals 12 oz of beer, 5 oz of wine, or 1 oz of hard liquor. Lifestyle  Work with your health care provider to maintain a healthy body weight, or to lose weight. Ask what an ideal weight is for you.  Get at least 30 minutes of exercise that causes your heart to beat faster (aerobic exercise) most days of the week. Activities may include walking, swimming, or biking.  Include exercise to strengthen your muscles (resistance exercise), such as weight lifting, as part of your weekly exercise routine. Try to do these types of exercises for 30 minutes at least 3 days a week.  Do not use any products that contain nicotine or tobacco, such as cigarettes and e-cigarettes. If you need help quitting, ask your health care provider.  Control any long-term (chronic) conditions you have, such as high cholesterol or diabetes. Monitoring  Monitor your blood pressure at home as told by your health care provider. Your personal target blood pressure may vary depending on your medical conditions, your age, and other factors.  Have your blood pressure checked regularly, as often as told by your health care provider. Working with your health care provider  Review all the medicines you take with your health care provider because there may be side effects or interactions.  Talk with your health care provider about your diet, exercise habits, and other lifestyle factors that may be contributing to hypertension.  Visit your health care provider regularly. Your health care provider can help you create and adjust your plan for managing hypertension. Will I need medicine to control my blood pressure? Your health care provider may prescribe medicine if lifestyle changes are not enough to get your blood pressure under control, and if:  Your systolic blood pressure is 130 or higher.  Your diastolic blood pressure is 80 or  higher.  Take medicines only as told by your health care provider. Follow the directions carefully. Blood pressure medicines must be taken as prescribed. The medicine does not work as well when you skip doses. Skipping doses also puts you at risk for problems. Contact a health care provider if:  You think you are having a reaction to medicines you have taken.  You have repeated (recurrent) headaches.  You feel dizzy.  You have swelling in your ankles.  You have trouble with your vision. Get help right away if:  You develop a severe headache or confusion.  You have unusual weakness or numbness, or you feel faint.  You have severe pain in your chest or abdomen.  You vomit repeatedly.  You have trouble breathing. Summary  Hypertension is when the force of blood pumping through your arteries is too strong. If this condition is not controlled, it may put you at risk for serious complications.  Your personal target blood pressure may vary depending on your medical conditions, your age, and other factors. For most people, a normal blood pressure is less than 120/80.  Hypertension is managed by lifestyle changes, medicines, or both. Lifestyle changes include weight loss, eating a healthy, low-sodium diet, exercising more, and limiting alcohol. This information is  not intended to replace advice given to you by your health care provider. Make sure you discuss any questions you have with your health care provider. Document Released: 07/23/2012 Document Revised: 09/26/2016 Document Reviewed: 09/26/2016 Elsevier Interactive Patient Education  [employer].

## 2017-10-17 NOTE — Progress Notes (Signed)
Subjective:    Patient ID: Andrew [name], male    DOB: 03-Aug-1951, 66 y.o.   MRN: [id number] Chief Complaint  Patient presents with  . Hypertension    HPI Andrew [name] is a 66 y.o. male who presents to Primary Care at [facility] for recheck of his uncontrolled HTN COPD, HLD.  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  HTN: On losartan. 2 mos prior, started hctz 12.5 but pt could not tolerate due to muscle cramps of toes, fingers, legs. (had also been tried on this 01/2016). Encouraged pt to try to stay on the losartan-hctz and increase water and K containing foods but sxs continued to d/c'd hctz and losartan increased to 132m on 9/19 Baseline Cr 1.33 ->1.41 -> 1.6 with EGFR of 64 ->60->49 --> has NOT been rechecked since losartan was increased. hctz was added at the middle value (Cr. 1.41; eGFR 60) The bisoprolol was added and was still 170-180/75-80.  It is usually when  Notes urinary urgency and hesirancy. Added on amlodipine 5 at his last OV in addition to the bisoprolol 5 qd (no different in BP when this was started, losartan 10  No pain, no sxs.  HLD: Lipid panel improving without meds meds but was still far above goal (LDL 162, non-HDL 179) so started on pravastatin 40 at last visit 3 mos prior anyway due to risk factors. However, he was having cramps in all his extremities. (rx'd lipitor prior but never filled due to cost).  COPD: Sxs seem to have worsened over the fall w/ more flaires. Trx'd w/ pred 40 x5d at our last visit < 2 mos ago as was needing to use alb inhaler in middle of night and upon awakening. 3 wks later pt was seen in ER for recurrence and treated w/ pred 60 qd x 5d after which I rx'd pt a home nebulizer machine (through [facility]) and albuterol and ipratropium nebs.  Has difficulty staying on symbicort (had gone off for sev wks at last visit) due to financial  concerns so tried changing to [place] coupon. Chest CT 1 mo prior showed mild centrilobular emphysematous changes, upper lobe predominant. Does have home neb but asn't needed.  Currently on symbicort with prn ventolin. Saw pulmonology Dr. WMelvyn Novas11/07/2017 who noted pt is sig improved on symbicort - changed to dulera due to insurance and recheck in 6 weeks with full PFTs.  Tobacco use: Ongoing; trying to cut down - at visit 2 mos ago was at a pack every 3-4 days and at lung cancer screening 1 mo proir stated he was down to 1-2 cigs/d but not yet ready to set a quit date. Pt has never tried medication for cessation, but open to it. Had screening chest CT 1 mo prior w/o area of concern for lung ca. Has cut down to 1 cig/mo but is around it all day.  CAD: Chest CT showed coronary atherosclerosis in the LAD and RCA - unable to judge severity. baseline EKG abnml  Seeing Dr. MAlyson Ingles urology, who did 2 biopsies but urine sxs just started recntly. Has had some pain in his knees some.   Is bowling, walking for exercise.  But not hitting it. Checking it at work and is usually 150s/70s -> 140/760s.   Had blood in urine since prostate but is resolving. Dr. MAlyson Ingles  Thinks he saw Dr. MAlyson Inglesthis  yr 2018 - last note we have is 05/2016.  Past Medical History:  Diagnosis Date  . Asthma   . COPD (chronic obstructive pulmonary disease) ([place])   . Hypertension    No past surgical history on file. Current Outpatient Medications on File Prior to Visit  Medication Sig Dispense Refill  . acetaminophen (TYLENOL) 500 MG tablet Take 500 mg by mouth every 6 (six) hours as needed.    Marland Kitchen albuterol (PROVENTIL HFA;VENTOLIN HFA) 108 (90 Base) MCG/ACT inhaler Inhale 2 puffs into the lungs every 4 (four) hours as needed for wheezing or shortness of breath (cough, shortness of breath or wheezing.). 1 Inhaler 11  . albuterol (PROVENTIL) (2.5 MG/3ML) 0.083% nebulizer solution Take 3 mLs (2.5 mg total) by nebulization every  6 (six) hours as needed for wheezing or shortness of breath. 150 mL 2  . bisoprolol (ZEBETA) 5 MG tablet Take 1 tablet (5 mg total) by mouth daily. 30 tablet 1  . cetirizine (ZYRTEC) 10 MG tablet TAKE 1 TABLET (10 MG TOTAL) BY MOUTH DAILY. 90 tablet 4  . ipratropium (ATROVENT) 0.02 % nebulizer solution Take 2.5 mLs (0.5 mg total) by nebulization 4 (four) times daily. (Patient taking differently: Take 0.5 mg every 4 (four) hours as needed by nebulization. ) 150 mL 2  . mometasone-formoterol (DULERA) 200-5 MCG/ACT AERO Inhale 2 puffs 2 (two) times daily into the lungs. 1 Inhaler 11  . Omeprazole Magnesium (PRILOSEC OTC PO) Take 1 tablet daily as needed by mouth.     . Triamcinolone Acetonide (TRIAMCINOLONE 0.1 % CREAM : EUCERIN) CREA Apply 1 application topically 2 (two) times daily as needed. 1 each 4   No current facility-administered medications on file prior to visit.    Allergies  Allergen Reactions  . Lisinopril Cough  . Lisinopril-Hydrochlorothiazide     dizziness   No family history on file. Social History   Socioeconomic History  . Marital status: Legally Separated    Spouse name: None  . Number of children: None  . Years of education: None  . Highest education level: None  Social Needs  . Financial resource strain: None  . Food insecurity - worry: None  . Food insecurity - inability: None  . Transportation needs - medical: None  . Transportation needs - non-medical: None  Occupational History  . None  Tobacco Use  . Smoking status: Current Some Day Smoker    Packs/day: 0.75    Years: 44.00    Pack years: 33.00    Types: Cigarettes  . Smokeless tobacco: Never Used  Substance and Sexual Activity  . Alcohol use: Yes    Comment: occasional  . Drug use: No  . Sexual activity: None  Other Topics Concern  . None  Social History Narrative  . None   Depression screen [facility] 2/9 10/17/2017 09/19/2017 09/03/2017 09/02/2017 08/19/2017  Decreased Interest 0 0 0 0 0  Down,  Depressed, Hopeless 0 0 0 0 0  PHQ - 2 Score 0 0 0 0 0     Review of Systems See hpi    Objective:   Physical Exam  Constitutional: He is oriented to person, place, and time. He appears well-developed and well-nourished. No distress.  HENT:  Head: Normocephalic and atraumatic.  Eyes: Conjunctivae are normal. Pupils are equal, round, and reactive to light. No scleral icterus.  Neck: Normal range of motion. Neck supple. No thyromegaly present.  Cardiovascular: Normal rate, regular rhythm, normal heart sounds and intact distal pulses.  Pulmonary/Chest: Effort normal  and breath sounds normal. No respiratory distress.  Musculoskeletal: He exhibits no edema.  Lymphadenopathy:    He has no cervical adenopathy.  Neurological: He is alert and oriented to person, place, and time.  Skin: Skin is warm and dry. He is not diaphoretic.  Psychiatric: He has a normal mood and affect. His behavior is normal.     BP (!) 158/84   Pulse 80   Temp 98.3 F (36.8 C)   Resp 16   Ht 6' 1.5" (1.867 m)   Wt 203 lb 9.6 oz (92.4 kg)   SpO2 98%   BMI 26.50 kg/m   BP Readings from Last 3 Encounters:  10/17/17 (!) 158/84  09/20/17 (!) 166/84  09/19/17 (!) 166/86    Recheck 168/80    Results for orders placed or performed in visit on 10/17/17  Urine Culture  Result Value Ref Range   Urine Culture, Routine Final report    Organism ID, Bacteria No growth   POCT urinalysis dipstick  Result Value Ref Range   Color, UA yellow yellow   Clarity, UA clear clear   Glucose, UA negative negative mg/dL   Bilirubin, UA negative negative   Ketones, POC UA negative negative mg/dL   Spec Grav, UA 1.020 1.010 - 1.025   Blood, UA small (A) negative   pH, UA 5.5 5.0 - 8.0   Protein Ur, POC negative negative mg/dL   Urobilinogen, UA 0.2 0.2 or 1.0 E.U./dL   Nitrite, UA Negative Negative   Leukocytes, UA Negative Negative  POCT Microscopic Urinalysis ([facility])  Result Value Ref Range   WBC,UR,HPF,POC None  None WBC/hpf   RBC,UR,HPF,POC None None RBC/hpf   Bacteria Few (A) None, Too numerous to count   Mucus Absent Absent   Epithelial Cells, UR Per Microscopy Few (A) None, Too numerous to count cells/hpf    Assessment & Plan:  Cmp, ua  Cards eval - baseline EKG abnml and CAD on CT - was referred to Dr. Einar Gip  1. Microscopic hematuria - resolved.  2. Resistant hypertension   Increase amlodipine from 5 to 10, change losartan 100 to olmesartan 40. Recheck in 3 wks.  Orders Placed This Encounter  Procedures  . Urine Culture  . POCT urinalysis dipstick  . POCT Microscopic Urinalysis ([facility])    Meds ordered this encounter  Medications  . amLODipine-olmesartan (AZOR) 10-40 MG tablet    Sig: Take 1 tablet by mouth daily.    Dispense:  90 tablet    Refill:  0    Delman Cheadle, M.D.  Primary Care at [facility] [address] [place], [place] [address] [phone] phone [phone] fax  10/17/17 3:29 PM

## 2017-10-19 LAB — URINE CULTURE: Organism ID, Bacteria: NO GROWTH

## 2017-10-23 ENCOUNTER — Encounter: Payer: Self-pay | Admitting: Family Medicine

## 2017-10-23 ENCOUNTER — Telehealth: Payer: Self-pay | Admitting: Family Medicine

## 2017-10-23 NOTE — Telephone Encounter (Signed)
Copied from [place]. Topic: Quick Communication - See Telephone Encounter >> Oct 23, 2017  4:01 PM Cleaster Corin, NT wrote: CRM for notification. See Telephone encounter for:   10/23/17. Pt. Called to see if he can get a more affordable  med. That is equivalent to the med [facility]) pt. [employer] and they said ask to see if med. Could be put on a different tier.

## 2017-10-24 NOTE — Telephone Encounter (Signed)
Message sent to Dr. Brigitte Pulse re: less exp med for [facility]

## 2017-10-25 MED ORDER — OLMESARTAN MEDOXOMIL 40 MG PO TABS: 40.0000 mg | ORAL_TABLET | Freq: Every day | ORAL | 1 refills | Status: DC

## 2017-10-25 MED ORDER — AMLODIPINE BESYLATE 10 MG PO TABS: 10.0000 mg | ORAL_TABLET | Freq: Every day | ORAL | 1 refills | Status: DC

## 2017-10-25 NOTE — Telephone Encounter (Signed)
Please fax a copy of PSA lab from 09/19/17 to Dr. Alyson Ingles at [facility] as pt has been instructed to sched a f/u appt with him regarding mild increase.  Same meds - just split apart into rx for amlodipine 10 and rx for olmesartan 40 sent to [facility].

## 2017-10-25 NOTE — Telephone Encounter (Signed)
Sent 10/25/17

## 2017-11-01 ENCOUNTER — Ambulatory Visit: Payer: Medicare HMO | Admitting: Internal Medicine

## 2017-11-06 DIAGNOSIS — J449 Chronic obstructive pulmonary disease, unspecified: Secondary | ICD-10-CM | POA: Diagnosis not present

## 2017-11-08 DIAGNOSIS — Z720 Tobacco use: Secondary | ICD-10-CM | POA: Diagnosis not present

## 2017-11-08 DIAGNOSIS — Z136 Encounter for screening for cardiovascular disorders: Secondary | ICD-10-CM | POA: Diagnosis not present

## 2017-11-08 DIAGNOSIS — I739 Peripheral vascular disease, unspecified: Secondary | ICD-10-CM | POA: Diagnosis not present

## 2017-11-08 DIAGNOSIS — I10 Essential (primary) hypertension: Secondary | ICD-10-CM | POA: Diagnosis not present

## 2017-11-12 DIAGNOSIS — I6523 Occlusion and stenosis of bilateral carotid arteries: Secondary | ICD-10-CM

## 2017-11-12 HISTORY — DX: Occlusion and stenosis of bilateral carotid arteries: I65.23

## 2017-11-13 DIAGNOSIS — N1831 Chronic kidney disease, stage 3a: Secondary | ICD-10-CM | POA: Insufficient documentation

## 2017-11-13 DIAGNOSIS — N1832 Chronic kidney disease, stage 3b: Secondary | ICD-10-CM | POA: Insufficient documentation

## 2017-11-13 DIAGNOSIS — N183 Chronic kidney disease, stage 3 (moderate): Secondary | ICD-10-CM

## 2017-11-13 NOTE — Progress Notes (Deleted)
Subjective:    Patient ID: Andrew [name], male    DOB: 02-Jan-1951, 67 y.o.   MRN: [id number] No chief complaint on file.   Hypertension    Andrew [name] is a 67 y.o. male who presents to Primary Care at [facility] for a 1 mo f/u of his uncontrolled HTN. COPD, HLD.    HTN: At last visit 1 mo prior, amlodipine increased from 5 to 33m and losartan 108mwas changed to olmesartan 40. Bisoprolol 5 continued though pt never noticed any reduction in bp when this was started.  Could not tolerate hctz 12.5 prior due to muscle cramps of toes, fingers, legs. (tried twice, 01/2016 and 09/2017).  Does check BP outside of the office and has been running   CKDIII: Baseline Cr 1.3-1.6 with EGFR of 43-58  BP Readings from Last 3 Encounters:  10/17/17 (!) 158/84  09/20/17 (!) 166/84  09/19/17 (!) 166/86   HLD: Uncontrolled. 06/29/17 LDL 162, non-HDL 179.  Unable to tolerate pravastatin 40 due to all 4 ext muscle cramps (never filled prior lipitor rx due to cost).  COPD: Sxs seem to have worsened over the fall w/ more flaires. Trx'd w/ pred 40 x5d at our last visit 07/2017 as was needing to use alb inhaler in middle of night and upon awakening. 3 wks later pt was seen in ER for recurrence and treated w/ pred 60 qd x 5d after which I rx'd pt a home nebulizer machine (through [facility]) and albuterol and ipratropium nebs.  Has difficulty staying on symbicort (had gone off for sev wks at last visit) due to financial concerns so tried changing to [place] coupon. Chest CT 19/2-18showed mild centrilobular emphysematous changes, upper lobe predominant. Does have home neb but asn't needed.  Currently on symbicort with prn ventolin. Saw pulmonology Dr. WeMelvyn Novas1/07/2017 who noted pt is sig improved on symbicort - changed to dulera due to insurance and recheck in 6 weeks with full PFTs.  Tobacco use: Ongoing; cut down from a pack every 3-4 days to 1 cig/mo at last visit a month ago but notes he is constantly exposed  to second hand smoke.  Pt has never tried medication for cessation, but open to it. screening chest CT 07/2017 w/o area of concern for lung ca.  CAD: Chest CT showed coronary atherosclerosis in the LAD and RCA - unable to judge severity. baseline EKG abnml so [facility] - no confirmation of appt or c/s note yet received  BPH w/ elevated PSA and symptomatic LUTS: urinary freq and hesitancy. Saw Dr. McAlyson Inglesurology, prior (pt thinks last visit was in 2018 but last c/s note we have avail is 05/2016) who did 2 biopsies but urine sxs just started recently since his last urology eval and recent PSA began increasing so advised pt to sched f/u appt with urology again.  Recent urine dips with + blood but none on microscopy.  Lab Results  Component Value Date   PSA1 6.6 (H) 09/19/2017   Component Value Date   PSA 5.4 (H) 09/14/2016   PSA 5.58 (H) 01/24/2016     Has had some pain in his knees which limits his exercise some - walking, bowling    Past Medical History:  Diagnosis Date  . Asthma   . COPD [place] obstructive [place] disease) [place]  . Hypertension    No past surgical history on file. Current Outpatient Medications on File Prior to Visit  Medication  Sig Dispense Refill  . acetaminophen (TYLENOL) 500 MG tablet Take 500 mg by mouth every 6 (six) hours as needed.    Marland Kitchen albuterol (PROVENTIL HFA;VENTOLIN HFA) 108 (90 Base) MCG/ACT inhaler Inhale 2 puffs into the lungs every 4 (four) hours as needed for wheezing or shortness of breath (cough, shortness of breath or wheezing.). 1 Inhaler 11  . albuterol (PROVENTIL) (2.5 MG/3ML) 0.083% nebulizer solution Take 3 mLs (2.5 mg total) by nebulization every 6 (six) hours as needed for wheezing or shortness of breath. 150 mL 2  . amLODipine (NORVASC) 10 MG tablet Take 1 tablet (10 mg total) by mouth daily. 90 tablet 1  . bisoprolol (ZEBETA) 5 MG tablet Take 1 tablet (5 mg total) by mouth daily. 30  tablet 1  . cetirizine (ZYRTEC) 10 MG tablet TAKE 1 TABLET (10 MG TOTAL) BY MOUTH DAILY. 90 tablet 4  . ipratropium (ATROVENT) 0.02 % nebulizer solution Take 2.5 mLs (0.5 mg total) by nebulization 4 (four) times daily. (Patient taking differently: Take 0.5 mg every 4 (four) hours as needed by nebulization. ) 150 mL 2  . mometasone-formoterol (DULERA) 200-5 MCG/ACT AERO Inhale 2 puffs 2 (two) times daily into the lungs. 1 Inhaler 11  . olmesartan (BENICAR) 40 MG tablet Take 1 tablet (40 mg total) by mouth daily. 90 tablet 1  . Omeprazole Magnesium (PRILOSEC OTC PO) Take 1 tablet daily as needed by mouth.     . Triamcinolone Acetonide (TRIAMCINOLONE 0.1 % CREAM : EUCERIN) CREA Apply 1 application topically 2 (two) times daily as needed. 1 each 4   No current facility-administered medications on file prior to visit.    Allergies  Allergen Reactions  . Lisinopril Cough  . Lisinopril-Hydrochlorothiazide     dizziness   No family history on file. Social History   Socioeconomic History  . Marital status: Legally Separated    Spouse name: Not on file  . Number of children: Not on file  . Years of education: Not on file  . Highest education level: Not on file  Social Needs  . Financial resource strain: Not on file  . Food insecurity - worry: Not on file  . Food insecurity - inability: Not on file  . Transportation needs - medical: Not on file  . Transportation needs - non-medical: Not on file  Occupational History  . Not on file  Tobacco Use  . Smoking status: Current Some Day Smoker    Packs/day: 0.75    Years: 44.00    Pack years: 33.00    Types: Cigarettes  . Smokeless tobacco: Never Used  Substance and Sexual Activity  . Alcohol use: Yes    Comment: occasional  . Drug use: No  . Sexual activity: Not on file  Other Topics Concern  . Not on file  Social History Narrative  . Not on file   Depression screen [facility] 2/9 10/17/2017 09/19/2017 09/03/2017 09/02/2017 08/19/2017    Decreased Interest 0 0 0 0 0  Down, Depressed, Hopeless 0 0 0 0 0  PHQ - 2 Score 0 0 0 0 0     Review of Systems See hpi    Objective:   Physical Exam  Constitutional: He is oriented to person, place, and time. He appears well-developed and well-nourished. No distress.  HENT:  Head: Normocephalic and atraumatic.  Eyes: Conjunctivae are normal. Pupils are equal, round, and reactive to light. No scleral icterus.  Neck: Normal range of motion. Neck supple. No thyromegaly present.  Cardiovascular: Normal  rate, regular rhythm, normal heart sounds and intact distal pulses.  Pulmonary/Chest: Effort normal and breath sounds normal. No respiratory distress.  Musculoskeletal: He exhibits no edema.  Lymphadenopathy:    He has no cervical adenopathy.  Neurological: He is alert and oriented to person, place, and time.  Skin: Skin is warm and dry. He is not diaphoretic.  Psychiatric: He has a normal mood and affect. His behavior is normal.     There were no vitals taken for this visit.     Assessment & Plan:   1. Essential hypertension   2. Atherosclerosis of native coronary artery of native heart without angina pectoris   3. Benign prostatic hyperplasia with urinary frequency   4. Elevated PSA, less than 10 ng/ml   5. Chronic kidney disease, stage 3 ([place])   6. Medication monitoring encounter   7. Cigarette smoker   8. Overweight (BMI 25.0-29.9)   9. Polyuria   10. Myalgia due to statin   11. HYPERCHOLESTEROLEMIA     Orders Placed This Encounter  Procedures  . Hemoglobin A1c  . Microalbumin/Creatinine Ratio, Urine  . Basic metabolic panel    Order Specific Question:   Has the patient fasted?    Answer:   No  . VITAMIN D 25 Hydroxy (Vit-D Deficiency, Fractures)  . PSA  . Ambulatory referral to Urology    Referral Priority:   Routine    Referral Type:   Consultation    Referral Reason:   Specialty Services Required    Referred to Provider:   Cleon Gustin, MD     Requested Specialty:   Urology    Number of Visits Requested:   1  . POCT urinalysis dipstick  . POCT Microscopic Urinalysis ([facility])    Meds ordered this encounter  Medications  . doxazosin (CARDURA) 2 MG tablet    Sig: Take 1 tablet (2 mg total) by mouth daily. After 2 weeks, Increase to 2 tabs po qd    Dispense:  60 tablet    Refill:  3    I personally performed the services described in this documentation, which was scribed in my presence. The recorded information has been reviewed and considered, and addended by me as needed.   Delman Cheadle, M.D.  Primary Care at [facility] [address] [place], [place] [address] [phone] phone [phone] fax  11/17/17 11:46 AM   Cards eval ever happen??  Sched appt w/ Dr. Alyson Ingles at urology?? Do cologuard  Retry other statin than pravastatin along with coenzyme q10 but replete vit D first which can sxs resolve statin-myalgias  No orders of the defined types were placed in this encounter.   No orders of the defined types were placed in this encounter.   Delman Cheadle, M.D.  Primary Care at [facility] [address] [place], [place] [address] [phone] phone [phone] fax  11/13/17 5:14 AM

## 2017-11-14 ENCOUNTER — Encounter: Payer: Self-pay | Admitting: Family Medicine

## 2017-11-14 ENCOUNTER — Other Ambulatory Visit: Payer: Self-pay

## 2017-11-14 ENCOUNTER — Ambulatory Visit (INDEPENDENT_AMBULATORY_CARE_PROVIDER_SITE_OTHER): Payer: Medicare HMO | Admitting: Family Medicine

## 2017-11-14 VITALS — BP 138/78 | HR 78 | Temp 98.0°F | Resp 16 | Ht 73.5 in | Wt 207.8 lb

## 2017-11-14 DIAGNOSIS — E7800 Pure hypercholesterolemia, unspecified: Secondary | ICD-10-CM | POA: Diagnosis not present

## 2017-11-14 DIAGNOSIS — R972 Elevated prostate specific antigen [PSA]: Secondary | ICD-10-CM | POA: Diagnosis not present

## 2017-11-14 DIAGNOSIS — E559 Vitamin D deficiency, unspecified: Secondary | ICD-10-CM

## 2017-11-14 DIAGNOSIS — R358 Other polyuria: Secondary | ICD-10-CM

## 2017-11-14 DIAGNOSIS — R8290 Unspecified abnormal findings in urine: Secondary | ICD-10-CM

## 2017-11-14 DIAGNOSIS — E663 Overweight: Secondary | ICD-10-CM

## 2017-11-14 DIAGNOSIS — R3129 Other microscopic hematuria: Secondary | ICD-10-CM

## 2017-11-14 DIAGNOSIS — I10 Essential (primary) hypertension: Secondary | ICD-10-CM | POA: Diagnosis not present

## 2017-11-14 DIAGNOSIS — N183 Chronic kidney disease, stage 3 unspecified: Secondary | ICD-10-CM

## 2017-11-14 DIAGNOSIS — R3915 Urgency of urination: Secondary | ICD-10-CM

## 2017-11-14 DIAGNOSIS — M791 Myalgia, unspecified site: Secondary | ICD-10-CM | POA: Diagnosis not present

## 2017-11-14 DIAGNOSIS — F17210 Nicotine dependence, cigarettes, uncomplicated: Secondary | ICD-10-CM | POA: Diagnosis not present

## 2017-11-14 DIAGNOSIS — T466X5A Adverse effect of antihyperlipidemic and antiarteriosclerotic drugs, initial encounter: Secondary | ICD-10-CM | POA: Diagnosis not present

## 2017-11-14 DIAGNOSIS — Z5181 Encounter for therapeutic drug level monitoring: Secondary | ICD-10-CM

## 2017-11-14 DIAGNOSIS — R3589 Other polyuria: Secondary | ICD-10-CM

## 2017-11-14 DIAGNOSIS — I2510 Atherosclerotic heart disease of native coronary artery without angina pectoris: Secondary | ICD-10-CM | POA: Diagnosis not present

## 2017-11-14 DIAGNOSIS — R350 Frequency of micturition: Secondary | ICD-10-CM | POA: Diagnosis not present

## 2017-11-14 DIAGNOSIS — N401 Enlarged prostate with lower urinary tract symptoms: Secondary | ICD-10-CM | POA: Diagnosis not present

## 2017-11-14 DIAGNOSIS — R7303 Prediabetes: Secondary | ICD-10-CM | POA: Insufficient documentation

## 2017-11-14 MED ORDER — DOXAZOSIN MESYLATE 2 MG PO TABS: 2.0000 mg | ORAL_TABLET | Freq: Every day | ORAL | 3 refills | Status: DC

## 2017-11-14 NOTE — Progress Notes (Addendum)
Subjective:  By signing my name below, I, Andrew [name], attest that this documentation has been prepared under the direction and in the presence of Andrew Cheadle, MD Electronically Signed: Ladene [name], ED Scribe 11/14/2017 at 4:02 PM.   Patient ID: Andrew [name], male    DOB: [birth year], 67 y.o.   MRN: [id number]   Chief Complaint  Patient presents with  . Follow-up   Hypertension    Andrew [name] is a 67 y.o. male who presents to Primary Care at [facility] for a 1 mo f/u of his uncontrolled HTN, COPD, HLD.  HTN: At last visit 1 mo prior, amlodipine increased from 5 to 32m and losartan 1015mwas changed to olmesartan 40. Bisoprolol 5 continued though pt never noticed any reduction in bp when this was started.  Could not tolerate hctz 12.5 prior due to muscle cramps of toes, fingers, legs. (tried twice, 01/2016 and 09/2017).  Does check BP outside of the office and has been running. Pt stopped Bisoprolol but states he was seen by his cardiologist Dr. GaEinar Gip1.5 weeks ago and prescribed bidil tid which he has not yet started. He checks his BP at work with average reading of 158/70. Pt was also started on nicotine lozenges and advised to start 81 mg aspirin.  CKDIII: Baseline Cr 1.3-1.6 with EGFR of 43-58  BP Readings from Last 3 Encounters:  10/17/17 (!) 158/84  09/20/17 (!) 166/84  09/19/17 (!) 166/86   HLD: Uncontrolled. 06/29/17 LDL 162, non-HDL 179.  Unable to tolerate pravastatin 40 due to all 4 ext muscle cramps (never filled prior lipitor rx due to cost). Andrew [name] started pt on rosuvastatin which he has not yet started. He is not currently taking Vit D or CoQ10.  COPD: Sxs seem to have worsened over the fall w/ more flaires. Trx'd w/ pred 40 x5d at our last visit 07/2017 as was needing to use alb inhaler in middle of night and upon awakening. 3 wks later pt was seen in ER for recurrence and treated w/ pred 60 qd x 5d after which I rx'd pt a home nebulizer machine (through  [employer]) and albuterol and ipratropium nebs.  Has difficulty staying on symbicort (had gone off for sev wks at last visit) due to financial concerns so tried changing to [place] coupon. Chest CT 19/2-18 showed mild centrilobular emphysematous changes, upper lobe predominant. Does have home neb but asn't needed. Currently on symbicort with prn ventolin. Saw pulmonology Dr. WeMelvyn Novas1/07/2017 who noted pt is sig improved on symbicort - changed to dulera due to insurance and recheck in 6 weeks with full PFTs.  Tobacco use: Ongoing; cut down from a pack every 3-4 days to 1 cig/mo at last visit a month ago but notes he is constantly exposed to second hand smoke.  Pt has never tried medication for cessation, but open to it. screening chest CT 07/2017 w/o area of concern for lung ca.  CAD: Chest CT showed coronary atherosclerosis in the LAD and RCA - unable to judge severity. baseline EKG abnml so [facility] - no confirmation of appt or c/s note yet received  BPH w/ elevated PSA and symptomatic LUTS: urinary freq and hesitancy. Saw Andrew [name], prior (pt thinks last visit was in 2018 but last c/s note we have avail is 05/2016) who did 2 biopsies but urine sxs just started recently since his last urology eval and recent PSA began increasing so advised  pt to sched f/u appt with urology again.  Recent urine dips with + blood but none on microscopy. Pt has noticed urinary frequency, urinary urgency and mildly malodorous urine x 1 wk. He states that symptoms have gradually worsened. Denies dysuria, pain with BMs, constipation. He has not yet scheduled a f/u with Dr. Alyson Ingles. Lab Results  Component Value Date   PSA1 6.6 (H) 09/19/2017   Component Value Date   PSA 5.4 (H) 09/14/2016   PSA 5.58 (H) 01/24/2016   Has had some pain in his knees which limits his exercise some - walking, bowling   Past Medical History:  Diagnosis Date  . Asthma   . COPD  (chronic obstructive pulmonary disease) ([place])   . Hypertension    No past surgical history on file. Current Outpatient Medications on File Prior to Visit  Medication Sig Dispense Refill  . acetaminophen (TYLENOL) 500 MG tablet Take 500 mg by mouth every 6 (six) hours as needed.    Marland Kitchen albuterol (PROVENTIL HFA;VENTOLIN HFA) 108 (90 Base) MCG/ACT inhaler Inhale 2 puffs into the lungs every 4 (four) hours as needed for wheezing or shortness of breath (cough, shortness of breath or wheezing.). 1 Inhaler 11  . albuterol (PROVENTIL) (2.5 MG/3ML) 0.083% nebulizer solution Take 3 mLs (2.5 mg total) by nebulization every 6 (six) hours as needed for wheezing or shortness of breath. 150 mL 2  . amLODipine (NORVASC) 10 MG tablet Take 1 tablet (10 mg total) by mouth daily. 90 tablet 1  . cetirizine (ZYRTEC) 10 MG tablet TAKE 1 TABLET (10 MG TOTAL) BY MOUTH DAILY. 90 tablet 4  . ipratropium (ATROVENT) 0.02 % nebulizer solution Take 2.5 mLs (0.5 mg total) by nebulization 4 (four) times daily. (Patient taking differently: Take 0.5 mg every 4 (four) hours as needed by nebulization. ) 150 mL 2  . mometasone-formoterol (DULERA) 200-5 MCG/ACT AERO Inhale 2 puffs 2 (two) times daily into the lungs. 1 Inhaler 11  . olmesartan (BENICAR) 40 MG tablet Take 1 tablet (40 mg total) by mouth daily. 90 tablet 1  . Omeprazole Magnesium (PRILOSEC OTC PO) Take 1 tablet daily as needed by mouth.     . Triamcinolone Acetonide (TRIAMCINOLONE 0.1 % CREAM : EUCERIN) CREA Apply 1 application topically 2 (two) times daily as needed. 1 each 4  . bisoprolol (ZEBETA) 5 MG tablet Take 1 tablet (5 mg total) by mouth daily. (Patient not taking: Reported on 11/14/2017) 30 tablet 1   No current facility-administered medications on file prior to visit.    Allergies  Allergen Reactions  . Lisinopril Cough  . Lisinopril-Hydrochlorothiazide     dizziness   No family history on file. Social History   Socioeconomic History  . Marital status:  Legally Separated    Spouse name: None  . Number of children: None  . Years of education: None  . Highest education level: None  Social Needs  . Financial resource strain: None  . Food insecurity - worry: None  . Food insecurity - inability: None  . Transportation needs - medical: None  . Transportation needs - non-medical: None  Occupational History  . None  Tobacco Use  . Smoking status: Current Some Day Smoker    Packs/day: 0.75    Years: 44.00    Pack years: 33.00    Types: Cigarettes  . Smokeless tobacco: Never Used  Substance and Sexual Activity  . Alcohol use: Yes    Comment: occasional  . Drug use: No  . Sexual activity:  None  Other Topics Concern  . None  Social History Narrative  . None   Depression screen [facility] 2/9 11/14/2017 10/17/2017 09/19/2017 09/03/2017 09/02/2017  Decreased Interest 0 0 0 0 0  Down, Depressed, Hopeless 0 0 0 0 0  PHQ - 2 Score 0 0 0 0 0    Review of Systems  Gastrointestinal: Negative for constipation.  Genitourinary: Positive for frequency and urgency. Negative for dysuria.  See hpi    Objective:   Physical Exam  Constitutional: He is oriented to person, place, and time. He appears well-developed and well-nourished. No distress.  HENT:  Head: Normocephalic and atraumatic.  Eyes: Conjunctivae are normal. Pupils are equal, round, and reactive to light. No scleral icterus.  Neck: Normal range of motion. Neck supple. No thyromegaly present.  Cardiovascular: Normal rate, regular rhythm, normal heart sounds and intact distal pulses.  Pulmonary/Chest: Effort normal and breath sounds normal. No respiratory distress.  Musculoskeletal: He exhibits no edema.  Lymphadenopathy:    He has no cervical adenopathy.  Neurological: He is alert and oriented to person, place, and time.  Skin: Skin is warm and dry. He is not diaphoretic.  Psychiatric: He has a normal mood and affect. His behavior is normal.   BP 138/78   Pulse 78   Temp 98 F (36.7  C)   Resp 16   Ht 6' 1.5" (1.867 m)   Wt 207 lb 12.8 oz (94.3 kg)   SpO2 98%   BMI 27.04 kg/m     Assessment & Plan:   1. Essential hypertension - BP looking amazing today and starting doxazosin which we will hopefully be able to titrate up further. Has not started BiDil from cards yet = worried about cost - but needs to try to obtain if BP still >140/90 on after starting doxazosin  2. Atherosclerosis of native coronary artery of native heart without angina pectoris   3. Benign prostatic hyperplasia with urinary frequency   4. Elevated PSA, less than 10 ng/ml - recently increased a little so would like pt to f/u with urology again - will recheck again today per pt request.  Start trial of doxazosin to trx LUTS sxs and HTN as well   5. Chronic kidney disease, stage 3 ([place])   6. Medication monitoring encounter   7. Cigarette smoker   8. Overweight (BMI 25.0-29.9)   9. Polyuria   10. Myalgia due to statin - ensure no vit D def and replace if needed. Retry other statin than pravastatin along with coenzyme q10 but replete vit D first which can sxs resolve statin-myalgias  11. HYPERCHOLESTEROLEMIA    12.   Vitamin D deficiency 13.   Elevated prostate specific antigen (PSA) 14.    Microscopic hematuria - only on dip, none seen on micro, improved at last visit, pt could not provide sample today. 15.     Increased urinary frequency 16.     Urinary urgency 17.     Abnormal urinary product   Do cologuard    Orders Placed This Encounter  Procedures  . Hemoglobin A1c  . Microalbumin/Creatinine Ratio, Urine  . Basic metabolic panel    Order Specific Question:   Has the patient fasted?    Answer:   No  . VITAMIN D 25 Hydroxy (Vit-D Deficiency, Fractures)  . PSA  . Ambulatory referral to Urology    Referral Priority:   Routine    Referral Type:   Consultation    Referral Reason:   Specialty  Services Required    Referred to Provider:   Cleon Gustin, MD    Requested Specialty:    Urology    Number of Visits Requested:   1  . POCT urinalysis dipstick  . POCT Microscopic Urinalysis ([facility])    Meds ordered this encounter  Medications  . doxazosin (CARDURA) 2 MG tablet    Sig: Take 1 tablet (2 mg total) by mouth daily. After 2 weeks, Increase to 2 tabs po qd    Dispense:  60 tablet    Refill:  3   I personally performed the services described in this documentation, which was scribed in my presence. The recorded information has been reviewed and considered, and addended by me as needed.   Andrew [name], M.D.  Primary Care at [facility] [address] [place], [place] [address] [phone] phone [phone] fax  11/13/17 4:01 PM

## 2017-11-14 NOTE — Patient Instructions (Addendum)
Start taking vitamin D 2000u and coenzyme Q10 supplement along with the new rosuvastatin 20mg  prescribed by the cardiologist.  Start the doxazosin for prostate symptoms AND for blood pressure.  If you are not having any dizziness with position change, after 2 weeks, increase the dose to two tabs daily.  Most people tolerate this medication better if they take it AT NIGHT, BEFORE BED so try that as long as you can remember it at that time.  Ok to hold off on the Bidil while you figure out the cost from [employer] order and try the doxazosin.  If after a month, your blood pressure is still >140/90 when you check it at work, then start the BiDil.  If you cannot afford the Bidil, let me know and I can send in the 2 medications in that pill separately (isosorbide mononitrate (brand name Imdur) is one and hydralazine is the other) which may be less expensive than the combo pill for some insurances.  IF you received an x-ray today, you will receive an invoice from [facility]. Please contact [facility] at [phone] with questions or concerns regarding your invoice.   IF you received labwork today, you will receive an invoice from [facility]. Please contact [facility] at [phone] with questions or concerns regarding your invoice.   Our billing staff will not be able to assist you with questions regarding bills from these companies.  You will be contacted with the lab results as soon as they are available. The fastest way to get your results is to activate your My Chart account. Instructions are located on the last page of this paperwork. If you have not heard from [place] regarding the results in 2 weeks, please contact this office.

## 2017-11-15 LAB — BASIC METABOLIC PANEL
BUN / CREAT RATIO: 10 (ref 10–24)
BUN: 14 mg/dL (ref 8–27)
CALCIUM: 10.1 mg/dL (ref 8.6–10.2)
CO2: 20 mmol/L (ref 20–29)
CREATININE: 1.38 mg/dL — AB (ref 0.76–1.27)
Chloride: 102 mmol/L (ref 96–106)
GFR calc Af Amer: 61 mL/min/{1.73_m2} (ref >59)
GFR calc non Af Amer: 53 mL/min/{1.73_m2} — ABNORMAL LOW (ref >59)
GLUCOSE: 95 mg/dL (ref 65–99)
Potassium: 4.2 mmol/L (ref 3.5–5.2)
Sodium: 140 mmol/L (ref 134–144)

## 2017-11-15 LAB — MICROALBUMIN / CREATININE URINE RATIO
Creatinine, Urine: 41.9 mg/dL
Microalb/Creat Ratio: 78.5 mg/g creat — ABNORMAL HIGH (ref 0.0–30.0)
Microalbumin, Urine: 32.9 ug/mL

## 2017-11-15 LAB — HEMOGLOBIN A1C
ESTIMATED AVERAGE GLUCOSE: 120 mg/dL
Hgb A1c MFr Bld: 5.8 % — ABNORMAL HIGH (ref 4.8–5.6)

## 2017-11-15 LAB — VITAMIN D 25 HYDROXY (VIT D DEFICIENCY, FRACTURES): Vit D, 25-Hydroxy: 16.5 ng/mL — ABNORMAL LOW (ref 30.0–100.0)

## 2017-11-15 LAB — PSA: Prostate Specific Ag, Serum: 9.3 ng/mL — ABNORMAL HIGH (ref 0.0–4.0)

## 2017-11-19 ENCOUNTER — Encounter: Payer: Self-pay | Admitting: Family Medicine

## 2017-11-19 DIAGNOSIS — Z720 Tobacco use: Secondary | ICD-10-CM | POA: Diagnosis not present

## 2017-11-19 DIAGNOSIS — Z136 Encounter for screening for cardiovascular disorders: Secondary | ICD-10-CM | POA: Diagnosis not present

## 2017-11-20 ENCOUNTER — Encounter: Payer: Self-pay | Admitting: Family Medicine

## 2017-11-20 DIAGNOSIS — R9431 Abnormal electrocardiogram [ECG] [EKG]: Secondary | ICD-10-CM | POA: Diagnosis not present

## 2017-11-20 DIAGNOSIS — R0602 Shortness of breath: Secondary | ICD-10-CM | POA: Diagnosis not present

## 2017-11-20 DIAGNOSIS — I10 Essential (primary) hypertension: Secondary | ICD-10-CM | POA: Diagnosis not present

## 2017-11-21 ENCOUNTER — Telehealth: Payer: Self-pay | Admitting: Internal Medicine

## 2017-11-21 NOTE — Telephone Encounter (Signed)
Left message for patient to call back. Do not see where anyone called.

## 2017-11-22 ENCOUNTER — Ambulatory Visit: Payer: Medicare HMO | Admitting: Internal Medicine

## 2017-11-22 DIAGNOSIS — R9431 Abnormal electrocardiogram [ECG] [EKG]: Secondary | ICD-10-CM | POA: Diagnosis not present

## 2017-11-22 DIAGNOSIS — I10 Essential (primary) hypertension: Secondary | ICD-10-CM | POA: Diagnosis not present

## 2017-11-22 DIAGNOSIS — R0602 Shortness of breath: Secondary | ICD-10-CM | POA: Diagnosis not present

## 2017-11-22 NOTE — Telephone Encounter (Signed)
lmtcb x2 for pt. 

## 2017-11-25 NOTE — Telephone Encounter (Signed)
lmtcb x3 for pt. 

## 2017-11-26 ENCOUNTER — Encounter: Payer: Self-pay | Admitting: Family Medicine

## 2017-11-26 DIAGNOSIS — R0989 Other specified symptoms and signs involving the circulatory and respiratory systems: Secondary | ICD-10-CM | POA: Diagnosis not present

## 2017-11-26 DIAGNOSIS — I739 Peripheral vascular disease, unspecified: Secondary | ICD-10-CM | POA: Diagnosis not present

## 2017-11-26 NOTE — Telephone Encounter (Signed)
We have attempted to contact the pt several times with no success or call back from the pt. Per triage protocol, message will be closed.  

## 2017-11-29 ENCOUNTER — Telehealth: Payer: Self-pay | Admitting: Family Medicine

## 2017-11-29 NOTE — Telephone Encounter (Signed)
Copied from [place]. Topic: Quick Communication - See Telephone Encounter >> Nov 29, 2017  8:27 AM Oneta Rack wrote: CRM for notification. See Telephone encounter for:   11/29/17.   Relation to pt: self Call back number: [phone] Pharmacy: [facility] [phone] (Phone) [phone] (Fax)    Reason for call:  Patient stated he wanted a 3 month supply sent to [facility] order due to the cost being cheaper instead of medication being sent to [facility], please send doxazosin (CARDURA) 2 MG tablet, amLODipine (NORVASC) 10 MG tablet, olmesartan (BENICAR) 40 MG tablet, please advise

## 2017-12-02 NOTE — Telephone Encounter (Signed)
Faxed today

## 2017-12-03 DIAGNOSIS — E559 Vitamin D deficiency, unspecified: Secondary | ICD-10-CM | POA: Insufficient documentation

## 2017-12-03 MED ORDER — VITAMIN D (ERGOCALCIFEROL) 1.25 MG (50000 UNIT) PO CAPS: 50000.0000 [IU] | ORAL_CAPSULE | ORAL | 0 refills | Status: DC

## 2017-12-03 NOTE — Addendum Note (Signed)
Addended by: Shawnee Knapp on: 12/03/2017 04:00 PM   Modules accepted: Orders

## 2017-12-06 ENCOUNTER — Encounter: Payer: Self-pay | Admitting: Family Medicine

## 2017-12-06 DIAGNOSIS — I6523 Occlusion and stenosis of bilateral carotid arteries: Secondary | ICD-10-CM

## 2017-12-06 HISTORY — DX: Occlusion and stenosis of bilateral carotid arteries: I65.23

## 2017-12-07 DIAGNOSIS — J449 Chronic obstructive pulmonary disease, unspecified: Secondary | ICD-10-CM | POA: Diagnosis not present

## 2017-12-09 ENCOUNTER — Ambulatory Visit: Payer: Medicare HMO | Admitting: Internal Medicine

## 2017-12-09 ENCOUNTER — Ambulatory Visit (INDEPENDENT_AMBULATORY_CARE_PROVIDER_SITE_OTHER): Payer: Medicare HMO | Admitting: Internal Medicine

## 2017-12-09 ENCOUNTER — Encounter: Payer: Self-pay | Admitting: Internal Medicine

## 2017-12-09 VITALS — BP 146/74 | HR 77 | Ht 72.5 in | Wt 207.6 lb

## 2017-12-09 DIAGNOSIS — J449 Chronic obstructive pulmonary disease, unspecified: Secondary | ICD-10-CM

## 2017-12-09 DIAGNOSIS — F17210 Nicotine dependence, cigarettes, uncomplicated: Secondary | ICD-10-CM | POA: Diagnosis not present

## 2017-12-09 LAB — PULMONARY FUNCTION TEST
DL/VA % pred: 71 %
DL/VA: 3.4 ml/min/mmHg/L
DLCO UNC: 22.28 ml/min/mmHg
DLCO cor % pred: 66 %
DLCO cor: 23.9 ml/min/mmHg
DLCO unc % pred: 62 %
FEF 25-75 Post: 1.25 L/sec
FEF 25-75 Pre: 1.13 L/sec
FEF2575-%Change-Post: 10 %
FEF2575-%PRED-POST: 43 %
FEF2575-%Pred-Pre: 39 %
FEV1-%CHANGE-POST: 1 %
FEV1-%PRED-POST: 72 %
FEV1-%Pred-Pre: 71 %
FEV1-PRE: 2.34 L
FEV1-Post: 2.38 L
FEV1FVC-%Change-Post: 1 %
FEV1FVC-%Pred-Pre: 76 %
FEV6-%Change-Post: 2 %
FEV6-%PRED-POST: 95 %
FEV6-%PRED-PRE: 93 %
FEV6-POST: 3.9 L
FEV6-PRE: 3.82 L
FEV6FVC-%CHANGE-POST: 1 %
FEV6FVC-%PRED-POST: 102 %
FEV6FVC-%PRED-PRE: 100 %
FVC-%CHANGE-POST: 0 %
FVC-%PRED-POST: 93 %
FVC-%Pred-Pre: 92 %
FVC-Post: 3.98 L
FVC-Pre: 3.96 L
PRE FEV6/FVC RATIO: 96 %
Post FEV1/FVC ratio: 60 %
Post FEV6/FVC ratio: 98 %
Pre FEV1/FVC ratio: 59 %
RV % PRED: 87 %
RV: 2.19 L
TLC % PRED: 86 %
TLC: 6.51 L

## 2017-12-09 MED ORDER — MOMETASONE FURO-FORMOTEROL FUM 200-5 MCG/ACT IN AERO: 2.0000 | INHALATION_SPRAY | Freq: Two times a day (BID) | RESPIRATORY_TRACT | 11 refills | Status: DC

## 2017-12-09 NOTE — Progress Notes (Signed)
PFT completed today 12/09/17

## 2017-12-09 NOTE — Progress Notes (Signed)
Subjective:     Patient ID: Andrew [name], male   DOB: 1951-02-12,    MRN: [id number]  HPI   39 yobm active smoker referred to pulmonary clinic 09/20/2017 by Dr   Ranee Gosselin for [place] eval    09/20/2017 1st [facility] office visit/ [facility]   Chief Complaint  Patient presents with  . Pulmonary Consult    Referred by [name] for eval of COPD. Pt states his breathing is currently at baseline. He uses an albuterol inhaler 2-3 x per wk and has atrovent and albuterol nebs that he rarely uses.    [facility] 07/21/17 did not req admit with rec stating on Breo 200 denies taking too expensive so started symb ? Strength p er rx but costs 300 per month  Baseline doe now on rx = best days Not limited by breathing from desired activities lots of fast pace walking at [employer]/ some hills ok  rec Plan A = Automatic = symbiocort 160=dulera 200 Take 2 puffs first thing in am and then another 2 puffs about 12 hours later.  Work on inhaler technique:  relax and gently blow all the way out then take a nice smooth deep breath back in, triggering the inhaler at same time you start breathing in.  Hold for up to 5 seconds if you can. Blow out thru nose. Rinse and gargle with water when done Plan B = Backup Only use your albuterol as a rescue medication Plan C = Crisis - only use your albuterol nebulizer if you first try Plan B     12/09/2017  f/u ov/[facility]ert re: COPD actually gold II with reversibility/ still smoking  Chief Complaint  Patient presents with  . Follow-up    follow up for COPD.    Dyspnea:   Improved:  MMRC1 = can walk nl pace, flat grade, can't hurry or go uphills or steps s sob   Cough: no Sleep: no No need for albuterol   No obvious day to day or daytime variability or assoc excess/ purulent sputum or mucus plugs or hemoptysis or cp or chest tightness, subjective wheeze or overt sinus or hb symptoms. No unusual exposure hx or h/o childhood pna/ asthma or knowledge of premature  birth.  Sleeping ok flat without nocturnal  or early am exacerbation  of respiratory  c/o's or need for noct saba. Also denies any obvious fluctuation of symptoms with weather or environmental changes or other aggravating or alleviating factors except as outlined above   Current Allergies, Complete Past Medical History, Past Surgical History, Family History, and Social History were reviewed in [employer] record.  ROS  The following are not active complaints unless bolded Hoarseness, sore throat, dysphagia, dental problems, itching, sneezing,  nasal congestion or discharge of excess mucus or purulent secretions, ear ache,   fever, chills, sweats, unintended wt loss or wt gain, classically pleuritic or exertional cp,  orthopnea pnd or leg swelling, presyncope, palpitations, abdominal pain, anorexia, nausea, vomiting, diarrhea  or change in bowel habits or change in bladder habits, change in stools or change in urine, dysuria, hematuria,  rash, arthralgias, visual complaints, headache, numbness, weakness or ataxia or problems with walking or coordination,  change in mood/affect or memory.        Current Meds  Medication Sig  . acetaminophen (TYLENOL) 500 MG tablet Take 500 mg by mouth every 6 (six) hours as needed.  Marland Kitchen albuterol (PROVENTIL HFA;VENTOLIN HFA) 108 (90 Base) MCG/ACT inhaler Inhale  2 puffs into the lungs every 4 (four) hours as needed for wheezing or shortness of breath (cough, shortness of breath or wheezing.).  Marland Kitchen albuterol (PROVENTIL) (2.5 MG/3ML) 0.083% nebulizer solution Take 3 mLs (2.5 mg total) by nebulization every 6 (six) hours as needed for wheezing or shortness of breath.  Marland Kitchen amLODipine (NORVASC) 10 MG tablet Take 1 tablet (10 mg total) by mouth daily.  . cetirizine (ZYRTEC) 10 MG tablet TAKE 1 TABLET (10 MG TOTAL) BY MOUTH DAILY.  Marland Kitchen doxazosin (CARDURA) 2 MG tablet Take 1 tablet (2 mg total) by mouth daily. After 2 weeks, Increase to 2 tabs po qd  .  mometasone-formoterol (DULERA) 200-5 MCG/ACT AERO Inhale 2 puffs into the lungs 2 (two) times daily.  Marland Kitchen olmesartan (BENICAR) 40 MG tablet Take 1 tablet (40 mg total) by mouth daily.  . Omeprazole Magnesium (PRILOSEC OTC PO) Take 1 tablet daily as needed by mouth.   . Triamcinolone Acetonide (TRIAMCINOLONE 0.1 % CREAM : EUCERIN) CREA Apply 1 application topically 2 (two) times daily as needed.  . Vitamin D, Ergocalciferol, (DRISDOL) 50000 units CAPS capsule Take 1 capsule (50,000 Units total) by mouth every 7 (seven) days.  .    .                  Objective:   Physical Exam   amb bm nad   12/09/2017       207   09/20/17 202 lb (91.6 kg)  09/19/17 202 lb (91.6 kg)  09/03/17 201 lb 3.2 oz (91.3 kg)    Vital signs reviewed - Note on arrival 02 sats  98% on RA      HEENT: nl   turbinates bilaterally, and oropharynx. Nl external ear canals without cough reflex - upper plate/ lower partial   NECK :  without JVD/Nodes/TM/ nl carotid upstrokes bilaterally   LUNGS: no acc muscle use,  Nl contour chest which is clear to A and P bilaterally without cough on insp or exp maneuvers   CV:  RRR  no s3 or murmur or increase in P2, and no edema   ABD:  soft and nontender with nl inspiratory excursion in the supine position. No bruits or organomegaly appreciated, bowel sounds nl  MS:  Nl gait/ ext warm without deformities, calf tenderness, cyanosis or clubbing No obvious joint restrictions   SKIN: warm and dry without lesions    NEURO:  alert, approp, nl sensorium with  no motor or cerebellar deficits apparent.                  Assessment:

## 2017-12-09 NOTE — Assessment & Plan Note (Signed)
07/19/17 CT Mild centrilobular emphysematous changes, upper lobe predominant - Spirometry 09/20/2017  FEV1 1.45 (42%)  Ratio 50 with typical curvature > dulera 200 (humana) 2bid   -  PFT's  12/09/2017  FEV1 2.38 (72 % ) ratio 60  p 1 % improvement from saba p symbicort 160 prior to study with DLCO  62/66 % corrects to 71  % for alv volume   - 12/09/2017  After extensive coaching inhaler device  effectiveness =    90% > continue dulera 200 or symb 160 depending on insurance   Marked improvement so much so this probably better charaterized as ACOS but rx is the same = rx as AB with symb 160 or dulera  200 2bid  I had an extended discussion with the patient reviewing all relevant studies completed to date and  lasting 15 to 20 minutes of a 25 minute visit on the following ongoing concerns:   1) Formulary restrictions will be an ongoing challenge for the forseable future and I would be happy to pick an alternative if the pt will first  provide me a list of them but pt  will need to return here for training for any new device that is required eg dpi vs hfa vs respimat.    In meantime we can always provide samples so the patient never runs out of any needed respiratory medications.   2) Each maintenance medication was reviewed in detail including most importantly the difference between maintenance and as needed and under what circumstances the prns are to be used.  Please see AVS for specific  Instructions which are unique to this visit and I personally typed out  which were reviewed in detail in writing with the patient and a copy provided.

## 2017-12-09 NOTE — Patient Instructions (Addendum)
No change in medications   Symbicort 160 = dulera 200 Take 2 puffs first thing in am and then another 2 puffs about 12 hours later.    The key is to stop smoking completely before smoking completely stops you - it's not too late!   Please schedule a follow up visit in 6  months but call sooner if needed

## 2017-12-09 NOTE — Assessment & Plan Note (Signed)

## 2017-12-11 ENCOUNTER — Telehealth: Payer: Self-pay | Admitting: Family Medicine

## 2017-12-11 NOTE — Telephone Encounter (Signed)
Andrew [name], from [facility] calling stating that the prescription for Pravastatin was received but it does not have directions, quantity or refill amount. Pharmacy would like a return call with clarification. [place] [phone], Ref# [id number]

## 2017-12-11 NOTE — Telephone Encounter (Signed)
Copied from [place]. Topic: Quick Communication - See Telephone Encounter >> Dec 11, 2017  1:12 PM Boyd Kerbs wrote: CRM for notification. See Telephone encounter for:   [place] [id number] Ref # [id number]   They received prescription for Pravastatin and it does not have the directions, quantity or refills.   Please call to clarify    12/11/17.

## 2017-12-11 NOTE — Telephone Encounter (Signed)
These medications are being prescribed by Dr. Einar Gip.

## 2017-12-13 DIAGNOSIS — I10 Essential (primary) hypertension: Secondary | ICD-10-CM | POA: Diagnosis not present

## 2017-12-13 DIAGNOSIS — I739 Peripheral vascular disease, unspecified: Secondary | ICD-10-CM | POA: Diagnosis not present

## 2017-12-13 DIAGNOSIS — Z720 Tobacco use: Secondary | ICD-10-CM | POA: Diagnosis not present

## 2017-12-13 DIAGNOSIS — I6523 Occlusion and stenosis of bilateral carotid arteries: Secondary | ICD-10-CM | POA: Diagnosis not present

## 2017-12-14 ENCOUNTER — Encounter: Payer: Self-pay | Admitting: Family Medicine

## 2017-12-14 ENCOUNTER — Other Ambulatory Visit: Payer: Self-pay | Admitting: Family Medicine

## 2017-12-14 DIAGNOSIS — I739 Peripheral vascular disease, unspecified: Secondary | ICD-10-CM

## 2017-12-14 MED ORDER — ASPIRIN EC 81 MG PO TBEC: 81.0000 mg | DELAYED_RELEASE_TABLET | Freq: Every day | ORAL | Status: DC

## 2017-12-14 MED ORDER — ROSUVASTATIN CALCIUM 40 MG PO TABS: 40.0000 mg | ORAL_TABLET | Freq: Every day | ORAL | Status: DC

## 2017-12-14 MED ORDER — CILOSTAZOL 50 MG PO TABS: 50.0000 mg | ORAL_TABLET | Freq: Two times a day (BID) | ORAL | Status: DC

## 2017-12-15 ENCOUNTER — Other Ambulatory Visit: Payer: Self-pay | Admitting: Family Medicine

## 2017-12-27 DIAGNOSIS — N401 Enlarged prostate with lower urinary tract symptoms: Secondary | ICD-10-CM | POA: Diagnosis not present

## 2017-12-27 DIAGNOSIS — C61 Malignant neoplasm of prostate: Secondary | ICD-10-CM | POA: Diagnosis not present

## 2017-12-27 DIAGNOSIS — R351 Nocturia: Secondary | ICD-10-CM | POA: Diagnosis not present

## 2018-01-01 ENCOUNTER — Telehealth (HOSPITAL_COMMUNITY): Payer: Self-pay

## 2018-01-01 NOTE — Telephone Encounter (Signed)
Attempted to call patient in regards to Pulmonary Rehab - lm on vm °

## 2018-01-01 NOTE — Telephone Encounter (Signed)
Patients insurance is active and benefits verified through [facility] - $10.00 co-pay, no deductible, Out of pocket amount of $3,400/$280.85 has been met, no co-insurance, and no pre-authorization is required. Spoke with [facility] - Reference [id number]

## 2018-01-07 ENCOUNTER — Telehealth (HOSPITAL_COMMUNITY): Payer: Self-pay

## 2018-01-07 DIAGNOSIS — J449 Chronic obstructive pulmonary disease, unspecified: Secondary | ICD-10-CM | POA: Diagnosis not present

## 2018-01-07 NOTE — Telephone Encounter (Signed)
2nd attempt to call patient in regards to Pulmonary rehab - lm on vm. Sending letter.

## 2018-01-09 ENCOUNTER — Telehealth (HOSPITAL_COMMUNITY): Payer: Self-pay

## 2018-01-09 NOTE — Telephone Encounter (Signed)
3rd attempt to call patient in regards to Pulmonary Rehab - lm on vm °

## 2018-01-13 ENCOUNTER — Ambulatory Visit (INDEPENDENT_AMBULATORY_CARE_PROVIDER_SITE_OTHER): Payer: Medicare HMO | Admitting: Family Medicine

## 2018-01-13 ENCOUNTER — Encounter: Payer: Self-pay | Admitting: Family Medicine

## 2018-01-13 ENCOUNTER — Other Ambulatory Visit: Payer: Self-pay

## 2018-01-13 VITALS — BP 146/80 | HR 80 | Temp 97.7°F | Resp 18 | Ht 72.5 in | Wt 208.4 lb

## 2018-01-13 DIAGNOSIS — J301 Allergic rhinitis due to pollen: Secondary | ICD-10-CM | POA: Diagnosis not present

## 2018-01-13 DIAGNOSIS — N401 Enlarged prostate with lower urinary tract symptoms: Secondary | ICD-10-CM

## 2018-01-13 DIAGNOSIS — I739 Peripheral vascular disease, unspecified: Secondary | ICD-10-CM | POA: Diagnosis not present

## 2018-01-13 DIAGNOSIS — F17210 Nicotine dependence, cigarettes, uncomplicated: Secondary | ICD-10-CM | POA: Diagnosis not present

## 2018-01-13 DIAGNOSIS — E559 Vitamin D deficiency, unspecified: Secondary | ICD-10-CM

## 2018-01-13 DIAGNOSIS — C61 Malignant neoplasm of prostate: Secondary | ICD-10-CM

## 2018-01-13 DIAGNOSIS — R350 Frequency of micturition: Secondary | ICD-10-CM

## 2018-01-13 DIAGNOSIS — N183 Chronic kidney disease, stage 3 (moderate): Secondary | ICD-10-CM

## 2018-01-13 DIAGNOSIS — I10 Essential (primary) hypertension: Secondary | ICD-10-CM | POA: Diagnosis not present

## 2018-01-13 DIAGNOSIS — J449 Chronic obstructive pulmonary disease, unspecified: Secondary | ICD-10-CM | POA: Diagnosis not present

## 2018-01-13 DIAGNOSIS — N1831 Chronic kidney disease, stage 3a: Secondary | ICD-10-CM

## 2018-01-13 DIAGNOSIS — R7303 Prediabetes: Secondary | ICD-10-CM

## 2018-01-13 DIAGNOSIS — E785 Hyperlipidemia, unspecified: Secondary | ICD-10-CM

## 2018-01-13 MED ORDER — VITAMIN D3 125 MCG (5000 UT) PO CAPS: 1.0000 | ORAL_CAPSULE | Freq: Every day | ORAL | 0 refills | Status: DC

## 2018-01-13 MED ORDER — FLUTICASONE PROPIONATE 50 MCG/ACT NA SUSP: 2.0000 | Freq: Every day | NASAL | 2 refills | Status: DC

## 2018-01-13 MED ORDER — CETIRIZINE HCL 10 MG PO TABS: ORAL_TABLET | ORAL | 4 refills | Status: DC

## 2018-01-13 MED ORDER — DILTIAZEM HCL ER 240 MG PO CP24: 240.0000 mg | ORAL_CAPSULE | Freq: Every day | ORAL | 0 refills | Status: DC

## 2018-01-13 NOTE — Patient Instructions (Addendum)
When you purchase your vitamin D supplement, look for 5000iu - take this daily for 3-6 months and then can decrease to taking it 3-4x/wk. Start taking a Coenzyme Q10 supplement along with your cholesterol medication to decrease cramps.   IF you received an x-ray today, you will receive an invoice from [facility]. Please contact [facility] at [phone] with questions or concerns regarding your invoice.   IF you received labwork today, you will receive an invoice from [facility]. Please contact [facility] at [phone] with questions or concerns regarding your invoice.   Our billing staff will not be able to assist you with questions regarding bills from these companies.  You will be contacted with the lab results as soon as they are available. The fastest way to get your results is to activate your My Chart account. Instructions are located on the last page of this paperwork. If you have not heard from [place] regarding the results in 2 weeks, please contact this office.     Managing Your Hypertension Hypertension is commonly called high blood pressure. This is when the force of your blood pressing against the walls of your arteries is too strong. Arteries are blood vessels that carry blood from your heart throughout your body. Hypertension forces the heart to work harder to pump blood, and may cause the arteries to become narrow or stiff. Having untreated or uncontrolled hypertension can cause heart attack, stroke, kidney disease, and other problems. What are blood pressure readings? A blood pressure reading consists of a higher number over a lower number. Ideally, your blood pressure should be below 120/80. The first ("top") number is called the systolic pressure. It is a measure of the pressure in your arteries as your heart beats. The second ("bottom") number is called the diastolic pressure. It is a measure of the pressure in your arteries as the heart relaxes. What does my  blood pressure reading mean? Blood pressure is classified into four stages. Based on your blood pressure reading, your health care provider may use the following stages to determine what type of treatment you need, if any. Systolic pressure and diastolic pressure are measured in a unit called mm Hg. Normal  Systolic pressure: below 119.  Diastolic pressure: below 80. Elevated  Systolic pressure: 417-408.  Diastolic pressure: below 80. Hypertension stage 1  Systolic pressure: 144-818.  Diastolic pressure: 56-31. Hypertension stage 2  Systolic pressure: 497 or above.  Diastolic pressure: 90 or above. What health risks are associated with hypertension? Managing your hypertension is an important responsibility. Uncontrolled hypertension can lead to:  A heart attack.  A stroke.  A weakened blood vessel (aneurysm).  Heart failure.  Kidney damage.  Eye damage.  Metabolic syndrome.  Memory and concentration problems.  What changes can I make to manage my hypertension? Hypertension can be managed by making lifestyle changes and possibly by taking medicines. Your health care provider will help you make a plan to bring your blood pressure within a normal range. Eating and drinking  Eat a diet that is high in fiber and potassium, and low in salt (sodium), added sugar, and fat. An example eating plan is called the DASH (Dietary Approaches to Stop Hypertension) diet. To eat this way: ? Eat plenty of fresh fruits and vegetables. Try to fill half of your plate at each meal with fruits and vegetables. ? Eat whole grains, such as whole wheat pasta, brown rice, or whole grain bread. Fill about one quarter of your plate with whole grains. ?  Eat low-fat diary products. ? Avoid fatty cuts of meat, processed or cured meats, and poultry with skin. Fill about one quarter of your plate with lean proteins such as fish, chicken without skin, beans, eggs, and tofu. ? Avoid premade and processed  foods. These tend to be higher in sodium, added sugar, and fat.  Reduce your daily sodium intake. Most people with hypertension should eat less than 1,500 mg of sodium a day.  Limit alcohol intake to no more than 1 drink a day for nonpregnant women and 2 drinks a day for men. One drink equals 12 oz of beer, 5 oz of wine, or 1 oz of hard liquor. Lifestyle  Work with your health care provider to maintain a healthy body weight, or to lose weight. Ask what an ideal weight is for you.  Get at least 30 minutes of exercise that causes your heart to beat faster (aerobic exercise) most days of the week. Activities may include walking, swimming, or biking.  Include exercise to strengthen your muscles (resistance exercise), such as weight lifting, as part of your weekly exercise routine. Try to do these types of exercises for 30 minutes at least 3 days a week.  Do not use any products that contain nicotine or tobacco, such as cigarettes and e-cigarettes. If you need help quitting, ask your health care provider.  Control any long-term (chronic) conditions you have, such as high cholesterol or diabetes. Monitoring  Monitor your blood pressure at home as told by your health care provider. Your personal target blood pressure may vary depending on your medical conditions, your age, and other factors.  Have your blood pressure checked regularly, as often as told by your health care provider. Working with your health care provider  Review all the medicines you take with your health care provider because there may be side effects or interactions.  Talk with your health care provider about your diet, exercise habits, and other lifestyle factors that may be contributing to hypertension.  Visit your health care provider regularly. Your health care provider can help you create and adjust your plan for managing hypertension. Will I need medicine to control my blood pressure? Your health care provider may  prescribe medicine if lifestyle changes are not enough to get your blood pressure under control, and if:  Your systolic blood pressure is 130 or higher.  Your diastolic blood pressure is 80 or higher.  Take medicines only as told by your health care provider. Follow the directions carefully. Blood pressure medicines must be taken as prescribed. The medicine does not work as well when you skip doses. Skipping doses also puts you at risk for problems. Contact a health care provider if:  You think you are having a reaction to medicines you have taken.  You have repeated (recurrent) headaches.  You feel dizzy.  You have swelling in your ankles.  You have trouble with your vision. Get help right away if:  You develop a severe headache or confusion.  You have unusual weakness or numbness, or you feel faint.  You have severe pain in your chest or abdomen.  You vomit repeatedly.  You have trouble breathing. Summary  Hypertension is when the force of blood pumping through your arteries is too strong. If this condition is not controlled, it may put you at risk for serious complications.  Your personal target blood pressure may vary depending on your medical conditions, your age, and other factors. For most people, a normal blood pressure is  less than 120/80.  Hypertension is managed by lifestyle changes, medicines, or both. Lifestyle changes include weight loss, eating a healthy, low-sodium diet, exercising more, and limiting alcohol. This information is not intended to replace advice given to you by your health care provider. Make sure you discuss any questions you have with your health care provider. Document Released: 07/23/2012 Document Revised: 09/26/2016 Document Reviewed: 09/26/2016 Elsevier Interactive Patient Education  [employer].

## 2018-01-13 NOTE — Progress Notes (Addendum)
Subjective:  By signing my name below, I, Moises Blood, attest that this documentation has been prepared under the direction and in the presence of Delman Cheadle, MD. Electronically Signed: Moises Blood, [place]. 01/13/2018 , 4:46 PM .  Patient was seen in Room 1 .   Patient ID: Andrew [name], male    DOB: Apr 04, 1951, 67 y.o.   MRN: [id number] Chief Complaint  Patient presents with  . Hypertension    Pt states he isn't sure if he is taking all the correction medication and brought them in to go over. Pt states he thinks he is missing a BP pill and states his BPs have been running high around 150/80 at work.  . Follow-up  . Medication Refill    Zyrtec 10 MG sent to [facility] on [address]   HPI Andrew [name] is a 67 y.o. male who presents to Primary Care at [facility] for follow up.   HTN Patient couldn't tolerate HCTZ due to severe cramping. He saw cardiology, Dr. Einar Gip, who prescribed him BiDil TID but at last visit 2 months ago he hadn't started due to concerns of cost. He stopped Bisoprolol as not effective and no reduction in BP noted. He continued amlodipine 86m, olmesartan 451m and aspirin 8132mAt visit 2 months prior, he was started on doxazosin with plans to titrate up if tolerated in hopes of also helping BPH and urinary frequency symptoms.   He checks his BP at his job, running 155s/75s. He's been wearing compression socks every other day. He denies chest pain. He mentions he can hear his pulse in the left side of his face, [name] outside of his left ear. He denies using nasal sprays.   BPH w/ h/o elevated PSA and minimal /mild prostate cancer Had prostate biopsy 11/2016 and 05/2017 which showed 10 -> 5% of core + but was stable so watchful waiting He was started on doxazosin 2 months prior, as PSA was slowly increasing.   Lab Results  Component Value Date   PSA1 9.3 (H) 11/14/2017   PSA1 6.6 (H) 09/19/2017    PSA 5.4 (H) 09/14/2016   PSA 5.58 (H) 01/24/2016   Patient was  referred back to [facility], seen on 2/15. He was started on Uroxatral 92m75mS with plan for recheck PSA and PVR in 1 month.   He hasn't started the Uroxatral yet. He notes recheck appointment schedule with Dr. McKeAlyson Inglesards the end of this month.   ED He is currently not using any medication for ED; aware that it'll be unsafe with his current medication regime.   Vitamin D deficiency Rx'd high dose once weekly x6 months but never filled it. Instead started OTC Vitamin D 1000 units/d 1 month prior but worried that that dose is still to high.  Mild pre-diabetes He had an A1C level of 5.8 in Jan 2019.   CKD 3  Baseline creatinine 1.4 and eGFR 43-61. 2 mos prior, microalbumin/Cr ratio + at 78  Hyperlipidemia Unable to tolerate pravastatin due to muscle cramps in all 4 extremities. Patient didn't fill Lipitor due to cost but is taking crestor 20 from cardiology though did have recurrence of severe leg/foot cramps sev nights ago. Has not tried coenzyme q10. Plan to replete Vitamin D first, and then retry statin along with Coenzyme q10.   COPD with ongoing tobacco use He saw Dr. WertMelvyn Novasonth prior.   Past Medical History:  Diagnosis Date  . Asthma   . Carotid artery  stenosis, asymptomatic, bilateral 12/06/2017   Consider recheck Dopplers in 6 months -> 05/2018 Mild to moderate bilateral carotid artery stenosis Carotid Dopplers done at [facility] cardiovascular 11/26/2017 show left internal carotid artery stenosis 50-69% with soft plaque.  Mild stenosis in the left common carotid artery <50%. Mild stenosis in the [name] common carotid artery <50% with moderate diffuse soft plaque.  Mild stenosis in the [name] e  . COPD (chronic obstructive pulmonary disease) ([place])   . Hypertension    History reviewed. No pertinent surgical history. Prior to Admission medications   Medication Sig Start Date End Date Taking? Authorizing Provider  acetaminophen (TYLENOL) 500 MG tablet Take 500 mg by mouth  every 6 (six) hours as needed.    [provider]  albuterol (PROVENTIL HFA;VENTOLIN HFA) 108 (90 Base) MCG/ACT inhaler Inhale 2 puffs into the lungs every 4 (four) hours as needed for wheezing or shortness of breath (cough, shortness of breath or wheezing.). 06/29/17   Shawnee Knapp, MD  albuterol (PROVENTIL) (2.5 MG/3ML) 0.083% nebulizer solution Take 3 mLs (2.5 mg total) by nebulization every 6 (six) hours as needed for wheezing or shortness of breath. 07/31/17   Shawnee Knapp, MD  amLODipine (NORVASC) 10 MG tablet Take 1 tablet (10 mg total) by mouth daily. 10/25/17   Shawnee Knapp, MD  aspirin EC 81 MG tablet Take 1 tablet (81 mg total) by mouth daily. 12/14/17   Shawnee Knapp, MD  cetirizine (ZYRTEC) 10 MG tablet TAKE 1 TABLET (10 MG TOTAL) BY MOUTH DAILY. 12/12/16   Weber, Damaris Hippo, PA-C  cilostazol (PLETAL) 50 MG tablet Take 1 tablet (50 mg total) by mouth 2 (two) times daily. 12/14/17   Shawnee Knapp, MD  doxazosin (CARDURA) 2 MG tablet Take 1 tablet (2 mg total) by mouth daily. After 2 weeks, Increase to 2 tabs po qd 11/14/17   Shawnee Knapp, MD  mometasone-formoterol [facility]) 200-5 MCG/ACT AERO Inhale 2 puffs into the lungs 2 (two) times daily. 12/09/17   Tanda Rockers, MD  olmesartan (BENICAR) 40 MG tablet Take 1 tablet (40 mg total) by mouth daily. 10/25/17   Shawnee Knapp, MD  Omeprazole Magnesium (PRILOSEC OTC PO) Take 1 tablet daily as needed by mouth.     [provider]  rosuvastatin (CRESTOR) 40 MG tablet Take 1 tablet (40 mg total) by mouth daily. 12/14/17   Shawnee Knapp, MD  Triamcinolone Acetonide (TRIAMCINOLONE 0.1 % CREAM : EUCERIN) CREA Apply 1 application topically 2 (two) times daily as needed. 06/29/17   Shawnee Knapp, MD  Vitamin D, Ergocalciferol, (DRISDOL) 50000 units CAPS capsule Take 1 capsule (50,000 Units total) by mouth every 7 (seven) days. 12/03/17   Shawnee Knapp, MD   Allergies  Allergen Reactions  . Lisinopril Cough  . Lisinopril-Hydrochlorothiazide     dizziness    History reviewed. No pertinent family history. Social History   Socioeconomic History  . Marital status: Legally Separated    Spouse name: None  . Number of children: None  . Years of education: None  . Highest education level: None  Social Needs  . Financial resource strain: None  . Food insecurity - worry: None  . Food insecurity - inability: None  . Transportation needs - medical: None  . Transportation needs - non-medical: None  Occupational History  . None  Tobacco Use  . Smoking status: Current Some Day Smoker    Packs/day: 0.75    Years: 44.00  Pack years: 33.00    Types: Cigarettes  . Smokeless tobacco: Never Used  Substance and Sexual Activity  . Alcohol use: Yes    Comment: occasional  . Drug use: No  . Sexual activity: None  Other Topics Concern  . None  Social History Narrative  . None   Depression screen [facility] 2/9 01/13/2018 11/14/2017 10/17/2017 09/19/2017 09/03/2017  Decreased Interest 0 0 0 0 0  Down, Depressed, Hopeless 0 0 0 0 0  PHQ - 2 Score 0 0 0 0 0    Review of Systems  Constitutional: Negative for fatigue and unexpected weight change.  Eyes: Negative for visual disturbance.  Respiratory: Negative for cough, chest tightness and shortness of breath.   Cardiovascular: Negative for chest pain, palpitations and leg swelling.  Gastrointestinal: Negative for abdominal pain and blood in stool.  Neurological: Negative for dizziness, light-headedness and headaches.       Objective:   Physical Exam  Constitutional: He is oriented to person, place, and time. He appears well-developed and well-nourished. No distress.  HENT:  Head: Normocephalic and atraumatic.  [name] Ear: External ear and ear canal normal. Tympanic membrane is retracted.  Left Ear: External ear and ear canal normal. Tympanic membrane is injected and retracted.  Nose: Mucosal edema (left nare) present.  Mouth/Throat: Oropharynx is clear and moist and mucous membranes are normal. No  oropharyngeal exudate.  Eyes: Conjunctivae and EOM are normal. Pupils are equal, round, and reactive to light. No scleral icterus.  Neck: Normal range of motion. Neck supple. No thyromegaly present.  Cardiovascular: Normal rate, regular rhythm, normal heart sounds and intact distal pulses.  Pulmonary/Chest: Effort normal and breath sounds normal. No respiratory distress.  Musculoskeletal: Normal range of motion. He exhibits no edema.  Lymphadenopathy:    He has no cervical adenopathy.  Neurological: He is alert and oriented to person, place, and time.  Skin: Skin is warm and dry. He is not diaphoretic. No erythema.  Psychiatric: He has a normal mood and affect. His behavior is normal.  Nursing note and vitals reviewed.   BP (!) 146/80 (BP Location: Left Arm, Patient Position: Sitting, Cuff Size: Normal)   Pulse 80   Temp 97.7 F (36.5 C) (Oral)   Resp 18   Ht 6' 0.5" (1.842 m)   Wt 208 lb 6.4 oz (94.5 kg)   SpO2 97%   BMI 27.88 kg/m      Assessment & Plan:    1. Essential hypertension -  Still significantly above goal. Cont amlodipine 10 and olmesartan 40.  Never tried doxazosin and urology now has him starting on more potent selective alpha-blocker instead so will d/c that idea. BiDil too expensive  - Concern imdur qd would not be efficient and could cause edema due to vasodilation when already vasodilated with amlodipine.  - Concern patient would be unable to comply with TID hydralazine,  Consider trial of triamterine or spironolactone - had severe cramping with HCTZ but potassium-sparing diuretic might not cause the same - however, concern the diuretic effect could make LUTS sxs worse (however testerone suppression of spironolactone might benefit worsening/progression of BPH).  Avoiding BB as stopped bystolic as ineffective - BP did not lower AT ALL either in or out of the office over the months he was on it. Needs cardioselective BB due to COPD w/ ongoing tob abuse. FOR NOW -  will do trial of diltiazem even though pt is on amlodipine as well.  Cardiology [facility]) asked pt to f/u w/  them ~5/1 (2 mos) and Dr. Virgina Jock will likely be able to make some excellent suggestions as to next steps in regimen then.    2. Seasonal allergic rhinitis due to pollen - restart zyrtec, start trial of nasal steroid  3. Vitamin D deficiency - start 5000 iu/d otc vit D supp x 3-6 mos, then can decrease to qod  4. Cigarette smoker - encouraged cessation - pt trying to cut down  5. Hyperlipidemia LDL goal <70 - just started crestor 20 sev wks ago and already noting cramps (could not tolerate pravastatin due to cramps). Advised that after vit D is replaced, may have less cramps and try adding in a daily coenzyme Q10 supp as well.  6. Benign prostatic hyperplasia with urinary frequency - sxs worsened several mos ago so referred back to [facility] by Dr. Alyson Ingles 3 wks ago- pt had never started the doxazosin and urologist was unaware I had prescribed it so they started pt on uroxatrol 10 qhs which he has an plans to start tonight.  Then f/u w/ urology after 1 mo trial of alpha-blocker to recheck PSA since last had increased significantly. Pt does have ED but is aware that he CANNOT use phosphodiesterase inhibitors until otherwise explicitly stated by cardiology due to severe vascular disease and poss that he will be rx'd a daily chronic nitrite for BP control.  7. Prediabetes - new diagnosis a1c 5.8 2 mos ago  8. Chronic kidney disease (CKD) stage G3a/A2, moderately decreased glomerular filtration rate (GFR) between 45-59 mL/min/1.73 square meter and albuminuria creatinine ratio between 30-299 mg/g (HCC)   9. COPD GOLD 2 still smoking  - seeing Dr. Melvyn Novas, currently stable  10. PERIPHERAL VASCULAR DISEASE - doing MUCH better on pletal, has compression stockings he is wearing some, will f/u at [facility] for recheck in sev mos. Cont asa 81 qd. Just started statin sev wks ago.  11.     Prostate cancer - minimal amount on prostate biopsies done 11/2016 and 05/2017 but recent PSA increase 5->9 2 mos ago so pt to start on alpha-blocker therapy and recheck w/ Dr. Alyson Ingles ([facility]) 4 wks later. Plans to start med today.   Meds ordered this encounter  Medications  . Cholecalciferol (VITAMIN D3) 5000 units CAPS    Sig: Take 1 capsule (5,000 Units total) by mouth daily.    Dispense:  180 capsule    Refill:  0  . fluticasone (FLONASE) 50 MCG/ACT nasal spray    Sig: Place 2 sprays into both nostrils at bedtime.    Dispense:  16 g    Refill:  2  . diltiazem (DILACOR XR) 240 MG 24 hr capsule    Sig: Take 1 capsule (240 mg total) by mouth daily.    Dispense:  90 capsule    Refill:  0  . cetirizine (ZYRTEC) 10 MG tablet    Sig: TAKE 1 TABLET (10 MG TOTAL) BY MOUTH DAILY.    Dispense:  90 tablet    Refill:  4    I personally performed the services described in this documentation, which was scribed in my presence. The recorded information has been reviewed and considered, and addended by me as needed.   Delman Cheadle, M.D.  Primary Care at [facility] [address] [place], [place] [address] [phone] phone [phone] fax  01/14/18 11:15 PM

## 2018-01-14 ENCOUNTER — Encounter: Payer: Self-pay | Admitting: Family Medicine

## 2018-01-14 ENCOUNTER — Telehealth (HOSPITAL_COMMUNITY): Payer: Self-pay

## 2018-01-14 DIAGNOSIS — C61 Malignant neoplasm of prostate: Secondary | ICD-10-CM | POA: Insufficient documentation

## 2018-01-14 NOTE — Telephone Encounter (Signed)
No response from patient - Closed referral. °

## 2018-02-04 DIAGNOSIS — J449 Chronic obstructive pulmonary disease, unspecified: Secondary | ICD-10-CM | POA: Diagnosis not present

## 2018-03-07 DIAGNOSIS — J449 Chronic obstructive pulmonary disease, unspecified: Secondary | ICD-10-CM | POA: Diagnosis not present

## 2018-03-17 ENCOUNTER — Other Ambulatory Visit: Payer: Self-pay

## 2018-03-17 ENCOUNTER — Encounter: Payer: Self-pay | Admitting: Family Medicine

## 2018-03-17 ENCOUNTER — Ambulatory Visit (INDEPENDENT_AMBULATORY_CARE_PROVIDER_SITE_OTHER): Payer: Medicare HMO | Admitting: Family Medicine

## 2018-03-17 VITALS — BP 130/72 | HR 83 | Temp 98.4°F | Resp 18 | Ht 72.5 in | Wt 206.7 lb

## 2018-03-17 DIAGNOSIS — E785 Hyperlipidemia, unspecified: Secondary | ICD-10-CM

## 2018-03-17 DIAGNOSIS — R7303 Prediabetes: Secondary | ICD-10-CM | POA: Diagnosis not present

## 2018-03-17 DIAGNOSIS — I10 Essential (primary) hypertension: Secondary | ICD-10-CM | POA: Diagnosis not present

## 2018-03-17 DIAGNOSIS — H6982 Other specified disorders of Eustachian tube, left ear: Secondary | ICD-10-CM

## 2018-03-17 DIAGNOSIS — E559 Vitamin D deficiency, unspecified: Secondary | ICD-10-CM | POA: Diagnosis not present

## 2018-03-17 DIAGNOSIS — N183 Chronic kidney disease, stage 3 (moderate): Secondary | ICD-10-CM | POA: Diagnosis not present

## 2018-03-17 DIAGNOSIS — N1831 Chronic kidney disease, stage 3a: Secondary | ICD-10-CM

## 2018-03-17 MED ORDER — IPRATROPIUM BROMIDE 0.03 % NA SOLN: 2.0000 | Freq: Four times a day (QID) | NASAL | 1 refills | Status: DC

## 2018-03-17 MED ORDER — OLMESARTAN MEDOXOMIL 40 MG PO TABS: 40.0000 mg | ORAL_TABLET | Freq: Every day | ORAL | 1 refills | Status: DC

## 2018-03-17 MED ORDER — AMLODIPINE BESYLATE 10 MG PO TABS: 10.0000 mg | ORAL_TABLET | Freq: Every day | ORAL | 1 refills | Status: DC

## 2018-03-17 MED ORDER — DILTIAZEM HCL ER 240 MG PO CP24: 240.0000 mg | ORAL_CAPSULE | Freq: Every day | ORAL | 1 refills | Status: DC

## 2018-03-17 NOTE — Progress Notes (Signed)
Subjective:  By signing my name below, I, Andrew [name], attest that this documentation has been prepared under the direction and in the presence of Delman Cheadle, MD Electronically Signed: Ladene Artist, ED Scribe 03/17/2018 at 4:12 PM.   Patient ID: Andrew [name], male    DOB: 01-15-1951, 67 y.o.   MRN: [id number]  Chief Complaint  Patient presents with  . Hypertension  . Follow-up   HPI Andrew [name] is a 67 y.o. male who presents to Primary Care at [facility] for f/u on HTN. Pt reports occasional lightheadedness with changing positions too quickly that resolves at rest. Denies falls, leg swelling.  Asthma Takes Dulera bid.  L Ear Pt is still experiencing pulsatile tinnitus on the L. He has tried Flonase without significant relief. Denies symptoms in the R ear.  Cholesterol Crestor caused muscle cramping so he has stopped the medication. Plan to possible restart the medication after the summer. Pt is still taking 5000 units Vit D3 1 tab daily, occasionally 2 tabs.  Past Medical History:  Diagnosis Date  . Asthma   . Carotid artery stenosis, asymptomatic, bilateral 12/06/2017   Consider recheck Dopplers in 6 months -> 05/2018 Mild to moderate bilateral carotid artery stenosis Carotid Dopplers done at [facility] cardiovascular 11/26/2017 show left internal carotid artery stenosis 50-69% with soft plaque.  Mild stenosis in the left common carotid artery <50%. Mild stenosis in the [name] common carotid artery <50% with moderate diffuse soft plaque.  Mild stenosis in the [name] e  . COCAINE ABUSE 01/30/2008   Qualifier: Diagnosis of  By: Amil Amen MD, Benjamine Mola    . COPD (chronic obstructive pulmonary disease) ([place])   . Hypertension    Current Outpatient Medications on File Prior to Visit  Medication Sig Dispense Refill  . acetaminophen (TYLENOL) 500 MG tablet Take 500 mg by mouth every 6 (six) hours as needed.    Marland Kitchen albuterol (PROVENTIL HFA;VENTOLIN HFA) 108 (90 Base) MCG/ACT inhaler Inhale  2 puffs into the lungs every 4 (four) hours as needed for wheezing or shortness of breath (cough, shortness of breath or wheezing.). 1 Inhaler 11  . albuterol (PROVENTIL) (2.5 MG/3ML) 0.083% nebulizer solution Take 3 mLs (2.5 mg total) by nebulization every 6 (six) hours as needed for wheezing or shortness of breath. 150 mL 2  . alfuzosin (UROXATRAL) 10 MG 24 hr tablet     . amLODipine (NORVASC) 10 MG tablet Take 1 tablet (10 mg total) by mouth daily. 90 tablet 1  . aspirin EC 81 MG tablet Take 1 tablet (81 mg total) by mouth daily.    . cetirizine (ZYRTEC) 10 MG tablet TAKE 1 TABLET (10 MG TOTAL) BY MOUTH DAILY. 90 tablet 4  . Cholecalciferol (VITAMIN D3) 5000 units CAPS Take 1 capsule (5,000 Units total) by mouth daily. 180 capsule 0  . cilostazol (PLETAL) 50 MG tablet Take 1 tablet (50 mg total) by mouth 2 (two) times daily.    Marland Kitchen diltiazem (DILACOR XR) 240 MG 24 hr capsule Take 1 capsule (240 mg total) by mouth daily. 90 capsule 0  . fluticasone (FLONASE) 50 MCG/ACT nasal spray Place 2 sprays into both nostrils at bedtime. 16 g 2  . mometasone-formoterol (DULERA) 200-5 MCG/ACT AERO Inhale 2 puffs into the lungs 2 (two) times daily. 1 Inhaler 11  . olmesartan (BENICAR) 40 MG tablet Take 1 tablet (40 mg total) by mouth daily. 90 tablet 1  . rosuvastatin (CRESTOR) 40 MG tablet Take 1 tablet (40 mg total) by mouth daily. (  Patient taking differently: Take 20 mg by mouth daily. )    . Triamcinolone Acetonide (TRIAMCINOLONE 0.1 % CREAM : EUCERIN) CREA Apply 1 application topically 2 (two) times daily as needed. 1 each 4   No current facility-administered medications on file prior to visit.    Allergies  Allergen Reactions  . Lisinopril Cough  . Lisinopril-Hydrochlorothiazide     dizziness   History reviewed. No pertinent surgical history. History reviewed. No pertinent family history. Social History   Socioeconomic History  . Marital status: Legally Separated    Spouse name: Not on file  .  Number of children: Not on file  . Years of education: Not on file  . Highest education level: Not on file  Occupational History  . Not on file  Social Needs  . Financial resource strain: Not on file  . Food insecurity:    Worry: Not on file    Inability: Not on file  . Transportation needs:    Medical: Not on file    Non-medical: Not on file  Tobacco Use  . Smoking status: Current Some Day Smoker    Packs/day: 0.75    Years: 44.00    Pack years: 33.00    Types: Cigarettes  . Smokeless tobacco: Never Used  Substance and Sexual Activity  . Alcohol use: Yes    Comment: occasional  . Drug use: No  . Sexual activity: Not on file  Lifestyle  . Physical activity:    Days per week: Not on file    Minutes per session: Not on file  . Stress: Not on file  Relationships  . Social connections:    Talks on phone: Not on file    Gets together: Not on file    Attends religious service: Not on file    Active member of club or organization: Not on file    Attends meetings of clubs or organizations: Not on file    Relationship status: Not on file  Other Topics Concern  . Not on file  Social History Narrative  . Not on file   Depression screen [facility] 2/9 03/17/2018 01/13/2018 11/14/2017 10/17/2017 09/19/2017  Decreased Interest 0 0 0 0 0  Down, Depressed, Hopeless 0 0 0 0 0  PHQ - 2 Score 0 0 0 0 0    Review of Systems  HENT: Positive for tinnitus (L ear).   Cardiovascular: Negative for leg swelling.  Musculoskeletal: Myalgias: resolved.  Neurological: Positive for light-headedness (occasional).      Objective:   Physical Exam  Constitutional: He is oriented to person, place, and time. He appears well-developed and well-nourished. No distress.  HENT:  Head: Normocephalic and atraumatic.  Left Ear: A middle ear effusion (very small) is present.  Eyes: Conjunctivae and EOM are normal.  Neck: Neck supple. Carotid bruit is not present. No tracheal deviation present.  Cardiovascular:  Normal rate and regular rhythm.  Murmur (soft) heard.  Systolic (ejection) murmur is present with a grade of 2/6. Pulmonary/Chest: Effort normal and breath sounds normal. No respiratory distress.  Musculoskeletal: Normal range of motion.  Neurological: He is alert and oriented to person, place, and time.  Skin: Skin is warm and dry.  Psychiatric: He has a normal mood and affect. His behavior is normal.  Nursing note and vitals reviewed.  BP 130/72 (BP Location: Left Arm, Patient Position: Sitting, Cuff Size: Normal)   Pulse 83   Temp 98.4 F (36.9 C) (Oral)   Resp 18   Ht 6'  0.5" (1.842 m)   Wt 206 lb 11.2 oz (93.8 kg)   SpO2 97%   BMI 27.65 kg/m     Assessment & Plan:   1. Chronic kidney disease (CKD) stage G3a/A2, moderately decreased glomerular filtration rate (GFR) between 45-59 mL/min/1.73 square meter and albuminuria creatinine ratio between 30-299 mg/g (HCC)   2. Essential hypertension   3. Vitamin D deficiency   4. Hyperlipidemia LDL goal <70   5. Prediabetes   6. Eustachian tube dysfunction, left     Orders Placed This Encounter  Procedures  . Comprehensive metabolic panel  . VITAMIN D 25 Hydroxy (Vit-D Deficiency, Fractures)  . Hemoglobin A1c    Meds ordered this encounter  Medications  . amLODipine (NORVASC) 10 MG tablet    Sig: Take 1 tablet (10 mg total) by mouth daily.    Dispense:  90 tablet    Refill:  1  . diltiazem (DILACOR XR) 240 MG 24 hr capsule    Sig: Take 1 capsule (240 mg total) by mouth daily.    Dispense:  90 capsule    Refill:  1  . olmesartan (BENICAR) 40 MG tablet    Sig: Take 1 tablet (40 mg total) by mouth daily.    Dispense:  90 tablet    Refill:  1  . ipratropium (ATROVENT) 0.03 % nasal spray    Sig: Place 2 sprays into the nose 4 (four) times daily.    Dispense:  30 mL    Refill:  1   I personally performed the services described in this documentation, which was scribed in my presence. The recorded information has been  reviewed and considered, and addended by me as needed.   Delman Cheadle, M.D.  Primary Care at [facility] [address] [place], [place] [address] [phone] phone [phone] fax  08/20/18 8:31 PM

## 2018-03-17 NOTE — Patient Instructions (Addendum)
DO YOUR COLOGUARD TEST WITH YOUR NEXT STOOL.  IF THE NEW NASAL SPRAY DOESN'T WORK, LET ME KNOW SO I CAN REFER YOU TO AN EAR, NOSE, AND THROAT DOCTOR.   IF you received an x-ray today, you will receive an invoice from [facility]. Please contact [facility] at [phone] with questions or concerns regarding your invoice.   IF you received labwork today, you will receive an invoice from [place]. Please contact [facility] at [phone] with questions or concerns regarding your invoice.   Our billing staff will not be able to assist you with questions regarding bills from these companies.  You will be contacted with the lab results as soon as they are available. The fastest way to get your results is to activate your My Chart account. Instructions are located on the last page of this paperwork. If you have not heard from [place] regarding the results in 2 weeks, please contact this office.     Eustachian Tube Dysfunction The eustachian tube connects the middle ear to the back of the nose. It regulates air pressure in the middle ear by allowing air to move between the ear and nose. It also helps to drain fluid from the middle ear space. When the eustachian tube does not function properly, air pressure, fluid, or both can build up in the middle ear. Eustachian tube dysfunction can affect one or both ears. What are the causes? This condition happens when the eustachian tube becomes blocked or cannot open normally. This may result from:  Ear infections.  Colds and other upper respiratory infections.  Allergies.  Irritation, such as from cigarette smoke or acid from the stomach coming up into the esophagus (gastroesophageal reflux).  Sudden changes in air pressure, such as from descending in an airplane.  Abnormal growths in the nose or throat, such as nasal polyps, tumors, or enlarged tissue at the back of the throat (adenoids).  What increases the risk? This condition may  be more likely to develop in people who smoke and people who are overweight. Eustachian tube dysfunction may also be more likely to develop in children, especially children who have:  Certain birth defects of the mouth, such as cleft palate.  Large tonsils and adenoids.  What are the signs or symptoms? Symptoms of this condition may include:  A feeling of fullness in the ear.  Ear pain.  Clicking or popping noises in the ear.  Ringing in the ear.  Hearing loss.  Loss of balance.  Symptoms may get worse when the air pressure around you changes, such as when you travel to an area of high elevation or fly on an airplane. How is this diagnosed? This condition may be diagnosed based on:  Your symptoms.  A physical exam of your ear, nose, and throat.  Tests, such as those that measure: ? The movement of your eardrum (tympanogram). ? Your hearing (audiometry).  How is this treated? Treatment depends on the cause and severity of your condition. If your symptoms are mild, you may be able to relieve your symptoms by moving air into ("popping") your ears. If you have symptoms of fluid in your ears, treatment may include:  Decongestants.  Antihistamines.  Nasal sprays or ear drops that contain medicines that reduce swelling (steroids).  In some cases, you may need to have a procedure to drain the fluid in your eardrum (myringotomy). In this procedure, a small tube is placed in the eardrum to:  Drain the fluid.  Restore the air in  the middle ear space.  Follow these instructions at home:  Take over-the-counter and prescription medicines only as told by your health care provider.  Use techniques to help pop your ears as recommended by your health care provider. These may include: ? Chewing gum. ? Yawning. ? Frequent, forceful swallowing. ? Closing your mouth, holding your nose closed, and gently blowing as if you are trying to blow air out of your nose.  Do not do any of  the following until your health care provider approves: ? Travel to high altitudes. ? Fly in airplanes. ? Work in a Pension scheme manager or room. ? Scuba dive.  Keep your ears dry. Dry your ears completely after showering or bathing.  Do not smoke.  Keep all follow-up visits as told by your health care provider. This is important. Contact a health care provider if:  Your symptoms do not go away after treatment.  Your symptoms come back after treatment.  You are unable to pop your ears.  You have: ? A fever. ? Pain in your ear. ? Pain in your head or neck. ? Fluid draining from your ear.  Your hearing suddenly changes.  You become very dizzy.  You lose your balance. This information is not intended to replace advice given to you by your health care provider. Make sure you discuss any questions you have with your health care provider. Document Released: 11/25/2015 Document Revised: 04/05/2016 Document Reviewed: 11/17/2014 Elsevier Interactive Patient Education  [employer].

## 2018-03-18 LAB — COMPREHENSIVE METABOLIC PANEL
ALK PHOS: 138 IU/L — AB (ref 39–117)
ALT: 16 IU/L (ref 0–44)
AST: 17 IU/L (ref 0–40)
Albumin/Globulin Ratio: 2.1 (ref 1.2–2.2)
Albumin: 4.5 g/dL (ref 3.6–4.8)
BUN / CREAT RATIO: 9 — AB (ref 10–24)
BUN: 17 mg/dL (ref 8–27)
Bilirubin Total: <0.2 mg/dL (ref 0.0–1.2)
CHLORIDE: 105 mmol/L (ref 96–106)
CO2: 21 mmol/L (ref 20–29)
Calcium: 9.9 mg/dL (ref 8.6–10.2)
Creatinine, Ser: 1.79 mg/dL — ABNORMAL HIGH (ref 0.76–1.27)
GFR calc non Af Amer: 38 mL/min/{1.73_m2} — ABNORMAL LOW (ref >59)
GFR, EST AFRICAN AMERICAN: 44 mL/min/{1.73_m2} — AB (ref >59)
Globulin, Total: 2.1 g/dL (ref 1.5–4.5)
Glucose: 94 mg/dL (ref 65–99)
POTASSIUM: 4.1 mmol/L (ref 3.5–5.2)
Sodium: 139 mmol/L (ref 134–144)
TOTAL PROTEIN: 6.6 g/dL (ref 6.0–8.5)

## 2018-03-18 LAB — VITAMIN D 25 HYDROXY (VIT D DEFICIENCY, FRACTURES): Vit D, 25-Hydroxy: 71.5 ng/mL (ref 30.0–100.0)

## 2018-03-18 LAB — HEMOGLOBIN A1C
ESTIMATED AVERAGE GLUCOSE: 120 mg/dL
Hgb A1c MFr Bld: 5.8 % — ABNORMAL HIGH (ref 4.8–5.6)

## 2018-03-20 ENCOUNTER — Telehealth: Payer: Self-pay

## 2018-03-20 NOTE — Telephone Encounter (Signed)
PA for ipratropium nasal spray started: Key: [id number] - PA Case ID: [id number] - Rx #: [id number]

## 2018-04-06 DIAGNOSIS — J449 Chronic obstructive pulmonary disease, unspecified: Secondary | ICD-10-CM | POA: Diagnosis not present

## 2018-04-06 IMAGING — CT CT CHEST LUNG CANCER SCREENING LOW DOSE W/O CM
2 of 4 series · 15 of 40 positions shown, 18 images · non-contrast
Comparison: None.

CLINICAL DATA: 66-year-old male current smoker, with 33 pack-year
history of smoking, for initial lung cancer screening

EXAM:
CT CHEST WITHOUT CONTRAST LOW-DOSE FOR LUNG CANCER SCREENING
TECHNIQUE: Multidetector CT imaging of the chest was performed following the
standard protocol without IV contrast.

[Series 2: thorax 5.0 i31f 3 · axial · 0.76mm/px · z∈[+1209,+1484]mm · 12 of 67 slices shown, 15 images]
[im 6/67  mediastinal]
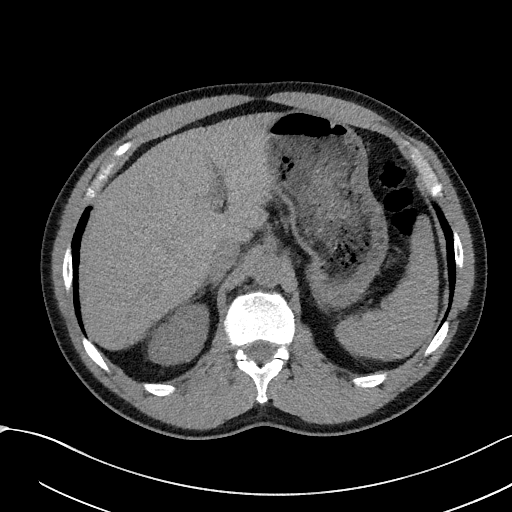
[im 6/67  lung]
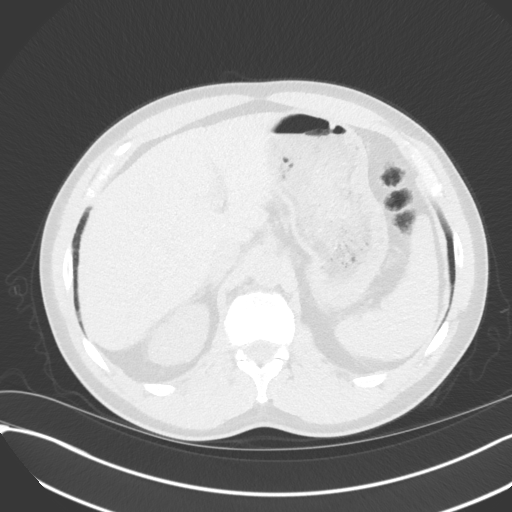
[im 11/67  lung]
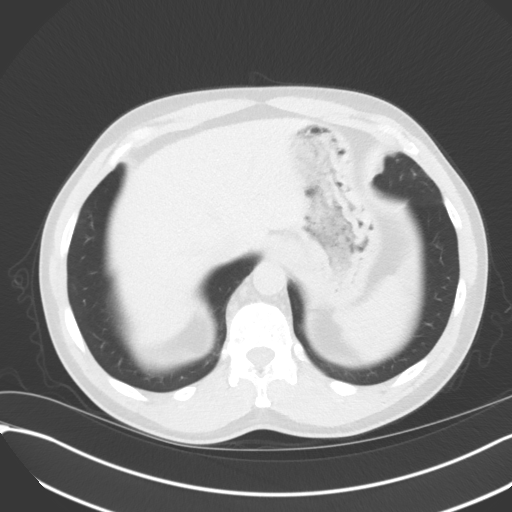
[im 16/67  lung]
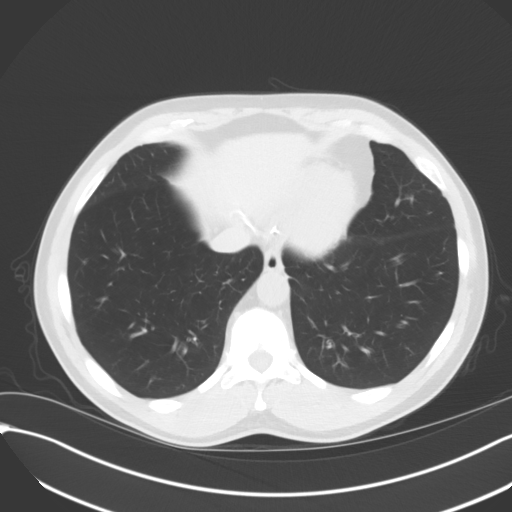
[im 21/67  lung]
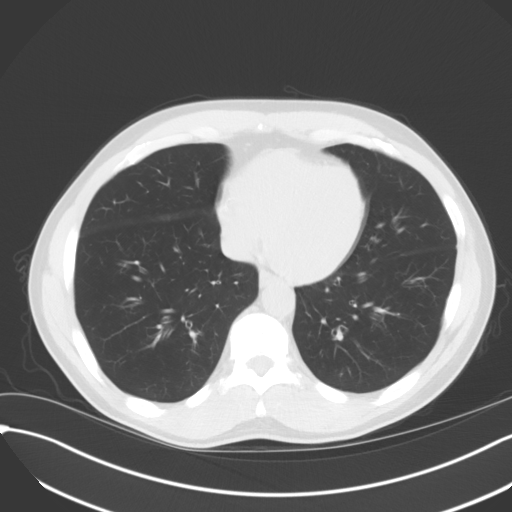
[im 26/67  mediastinal]
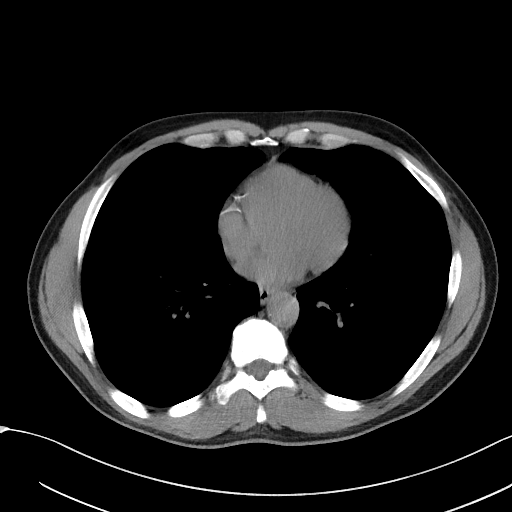
[im 26/67  lung]
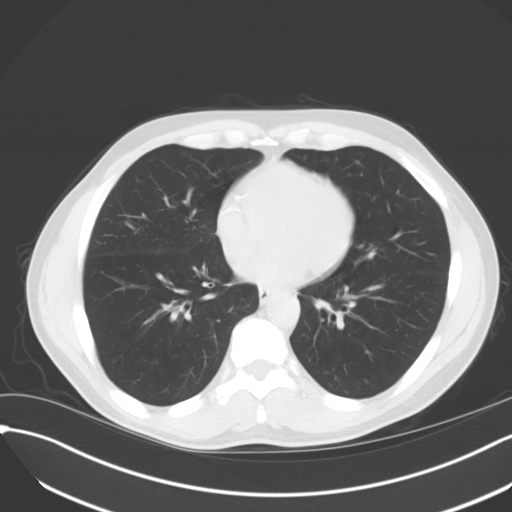
[im 31/67  lung]
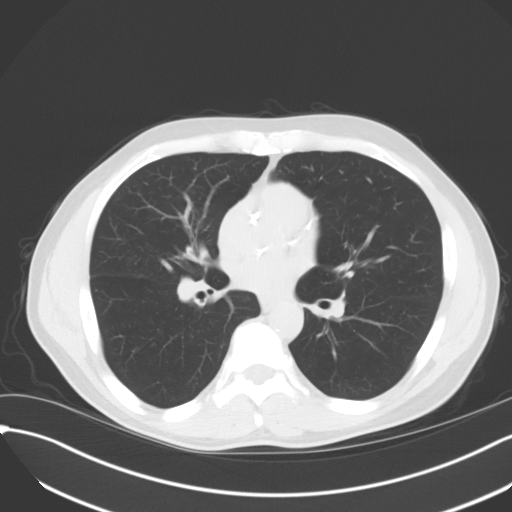
[im 36/67  lung]
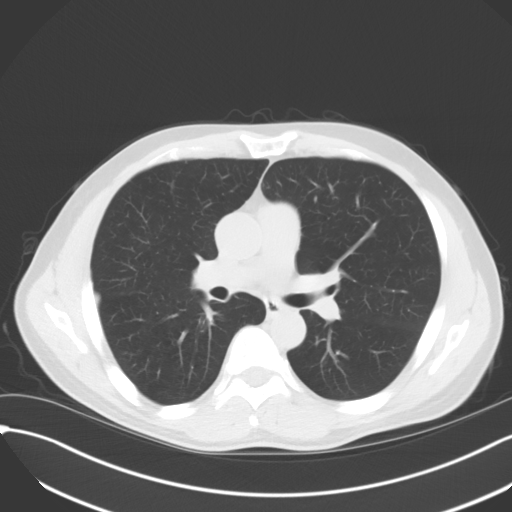
[im 41/67  lung]
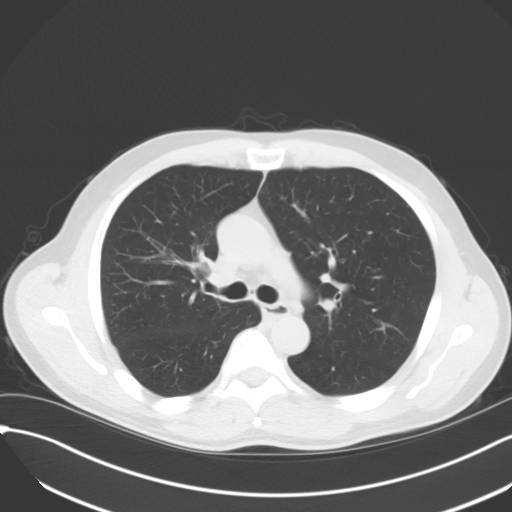
[im 46/67  mediastinal]
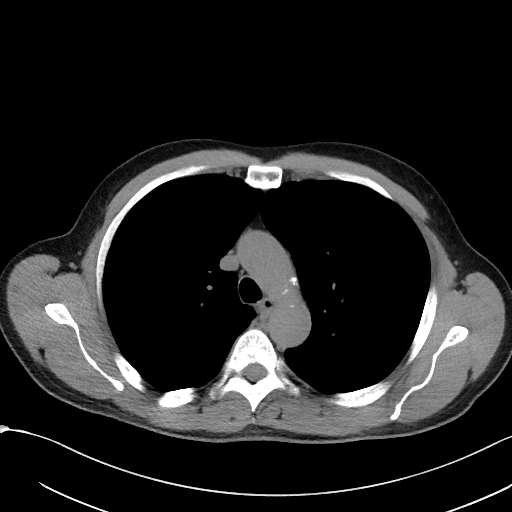
[im 46/67  lung]
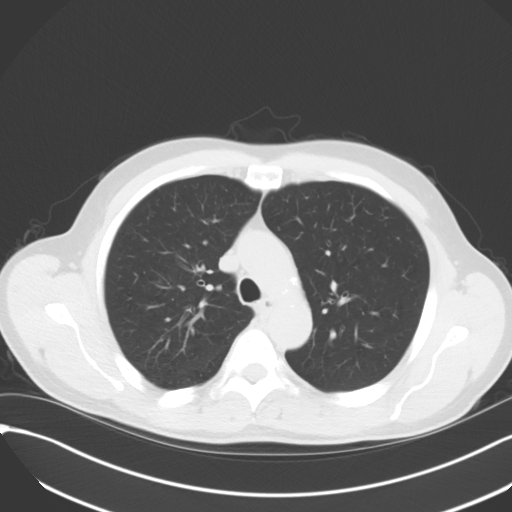
[im 51/67  lung]
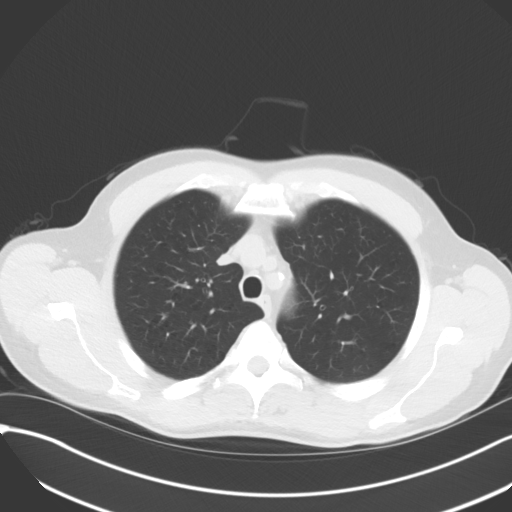
[im 56/67  lung]
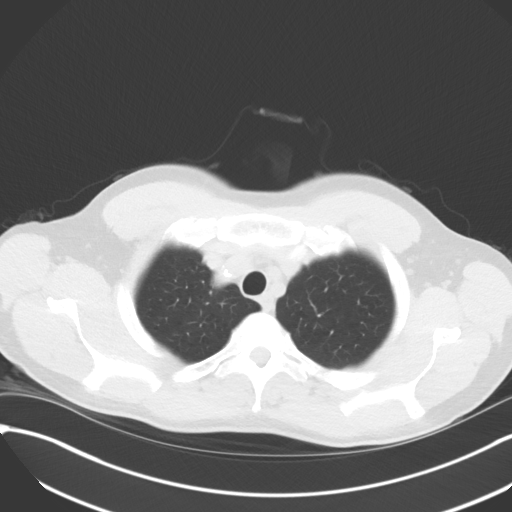
[im 61/67  lung]
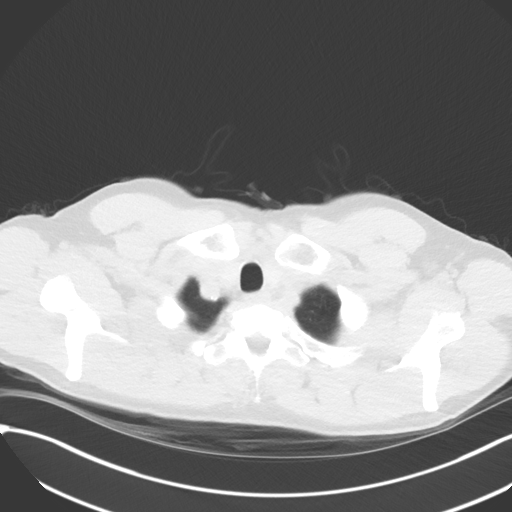

[Series 5: coronal · coronal · 0.67mm/px · 3 of 131 slices shown]
[im 27/131  lung]
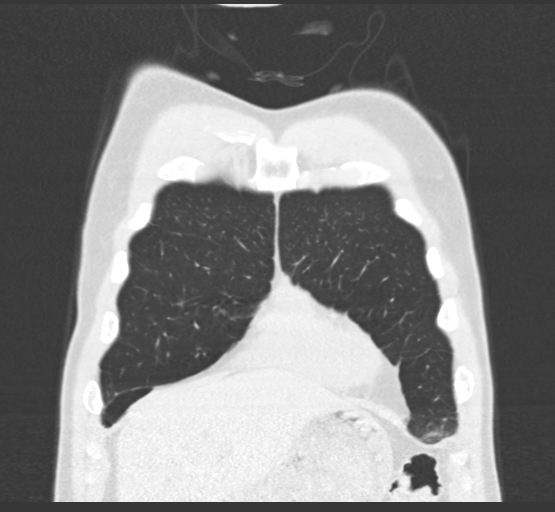
[im 53/131  lung]
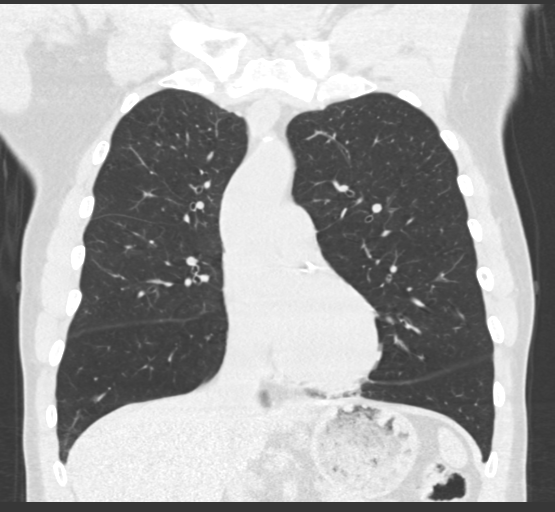
[im 79/131  lung]
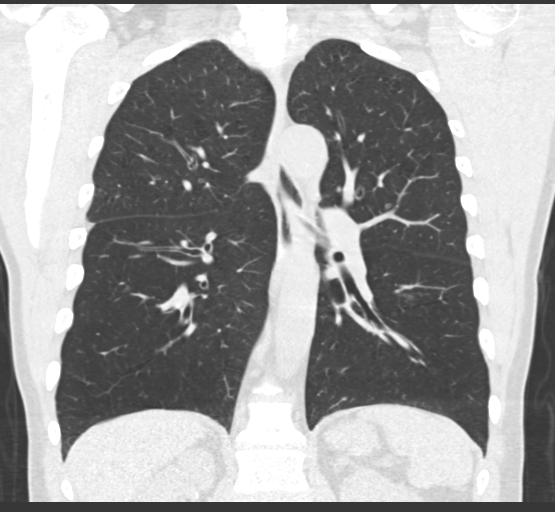

[15 of 40 positions shown; findings below may reference images not displayed]

FINDINGS: Cardiovascular: Heart is normal in size.  No pericardial effusion.

No evidence of thoracic aortic aneurysm. Mild atherosclerotic
calcifications aortic arch.

Coronary atherosclerosis the LAD and right coronary artery.

Mediastinum/Nodes: Small mediastinal lymph nodes which do not meet
pathologic CT size criteria.

Visualized thyroid is unremarkable.

Lungs/Pleura: Mild centrilobular emphysematous changes, upper lobe
predominant.

No focal consolidation.

No pleural effusion or pneumothorax.

Upper Abdomen: Visualized upper abdomen is notable for bilateral
renal cysts measuring up to 2.2 cm on the left.

Musculoskeletal: Visualized osseous structures are within normal
limits.
IMPRESSION: Lung-RADS 1, negative. Continue annual screening with low-dose chest
CT without contrast in 12 months.

Aortic Atherosclerosis (HYHUK-P2R.R) and Emphysema (HYHUK-5OS.H).

## 2018-04-11 DIAGNOSIS — C61 Malignant neoplasm of prostate: Secondary | ICD-10-CM | POA: Diagnosis not present

## 2018-04-11 DIAGNOSIS — R351 Nocturia: Secondary | ICD-10-CM | POA: Diagnosis not present

## 2018-04-11 DIAGNOSIS — N401 Enlarged prostate with lower urinary tract symptoms: Secondary | ICD-10-CM | POA: Diagnosis not present

## 2018-05-07 DIAGNOSIS — J449 Chronic obstructive pulmonary disease, unspecified: Secondary | ICD-10-CM | POA: Diagnosis not present

## 2018-05-30 DIAGNOSIS — C61 Malignant neoplasm of prostate: Secondary | ICD-10-CM | POA: Diagnosis not present

## 2018-06-06 DIAGNOSIS — J449 Chronic obstructive pulmonary disease, unspecified: Secondary | ICD-10-CM | POA: Diagnosis not present

## 2018-06-09 ENCOUNTER — Ambulatory Visit: Payer: Medicare HMO | Admitting: Internal Medicine

## 2018-06-20 ENCOUNTER — Telehealth: Payer: Self-pay | Admitting: Family Medicine

## 2018-06-20 NOTE — Telephone Encounter (Signed)
Called pt. To reschedule appt. On 09/15/18. Rescheduled

## 2018-06-25 ENCOUNTER — Ambulatory Visit: Payer: Medicare HMO | Admitting: Internal Medicine

## 2018-07-04 DIAGNOSIS — C61 Malignant neoplasm of prostate: Secondary | ICD-10-CM | POA: Diagnosis not present

## 2018-07-07 DIAGNOSIS — J449 Chronic obstructive pulmonary disease, unspecified: Secondary | ICD-10-CM | POA: Diagnosis not present

## 2018-07-08 ENCOUNTER — Encounter: Payer: Self-pay | Admitting: Internal Medicine

## 2018-07-08 ENCOUNTER — Ambulatory Visit: Payer: Medicare HMO | Admitting: Internal Medicine

## 2018-07-08 ENCOUNTER — Other Ambulatory Visit (INDEPENDENT_AMBULATORY_CARE_PROVIDER_SITE_OTHER): Payer: Medicare HMO

## 2018-07-08 VITALS — BP 130/72 | HR 73 | Ht 73.5 in | Wt 204.0 lb

## 2018-07-08 DIAGNOSIS — J309 Allergic rhinitis, unspecified: Secondary | ICD-10-CM | POA: Diagnosis not present

## 2018-07-08 DIAGNOSIS — J449 Chronic obstructive pulmonary disease, unspecified: Secondary | ICD-10-CM

## 2018-07-08 DIAGNOSIS — F17210 Nicotine dependence, cigarettes, uncomplicated: Secondary | ICD-10-CM | POA: Diagnosis not present

## 2018-07-08 LAB — CBC WITH DIFFERENTIAL/PLATELET
BASOS ABS: 0.1 10*3/uL (ref 0.0–0.1)
BASOS PCT: 0.9 % (ref 0.0–3.0)
EOS PCT: 4.1 % (ref 0.0–5.0)
Eosinophils Absolute: 0.3 10*3/uL (ref 0.0–0.7)
HCT: 41.8 % (ref 39.0–52.0)
Hemoglobin: 14.2 g/dL (ref 13.0–17.0)
LYMPHS ABS: 1.6 10*3/uL (ref 0.7–4.0)
Lymphocytes Relative: 26.1 % (ref 12.0–46.0)
MCHC: 34 g/dL (ref 30.0–36.0)
MCV: 85.3 fl (ref 78.0–100.0)
MONOS PCT: 8.7 % (ref 3.0–12.0)
Monocytes Absolute: 0.5 10*3/uL (ref 0.1–1.0)
NEUTROS ABS: 3.8 10*3/uL (ref 1.4–7.7)
NEUTROS PCT: 60.2 % (ref 43.0–77.0)
PLATELETS: 268 10*3/uL (ref 150.0–400.0)
RBC: 4.9 Mil/uL (ref 4.22–5.81)
RDW: 13.5 % (ref 11.5–15.5)
WBC: 6.3 10*3/uL (ref 4.0–10.5)

## 2018-07-08 MED ORDER — MOMETASONE FURO-FORMOTEROL FUM 200-5 MCG/ACT IN AERO: 2.0000 | INHALATION_SPRAY | Freq: Two times a day (BID) | RESPIRATORY_TRACT | 11 refills | Status: DC

## 2018-07-08 NOTE — Progress Notes (Signed)
Subjective:     Patient ID: Andrew [name], male   DOB: 08/30/1951,    MRN: [id number]     Brief patient profile:  77 yobm active smoker referred to pulmonary clinic 09/20/2017 by Dr   Ranee Gosselin for copd eval   History of Present Illness  09/20/2017 1st [facility] [facility] office visit/ W[facility]   Chief Complaint  Patient presents with  . Pulmonary Consult    Referred by [name] for eval of COPD. Pt states his breathing is currently at baseline. He uses an albuterol inhaler 2-3 x per wk and has atrovent and albuterol nebs that he rarely uses.    exac 07/21/17 did not req admit with rec stating on Breo 200 denies taking too expensive so started symb ? Strength p er rx but costs 300 per month  Baseline doe now on rx = best days Not limited by breathing from desired activities lots of fast pace walking at [employer]/ some hills ok  rec Plan A = Automatic = symbiocort 160=dulera 200 Take 2 puffs first thing in am and then another 2 puffs about 12 hours later.  Work on inhaler technique:  relax and gently blow all the way out then take a nice smooth deep breath back in, triggering the inhaler at same time you start breathing in.  Hold for up to 5 seconds if you can. Blow out thru nose. Rinse and gargle with water when done Plan B = Backup Only use your albuterol as a rescue medication Plan C = Crisis - only use your albuterol nebulizer if you first try Plan B     12/09/2017  f/u ov/[facility] re: COPD actually gold II with reversibility/ still smoking  Chief Complaint  Patient presents with  . Follow-up    follow up for COPD.   Dyspnea:   Improved:  MMRC1 = can walk nl pace, flat grade, can't hurry or go uphills or steps s sob   Cough: no Sleep: no No need for albuterol  rec No change in medications  Symbicort 160 = dulera 200 Take 2 puffs first thing in am and then another 2 puffs about 12 hours later.  The key is to stop smoking completely before smoking completely stops you -  it's not too late!    07/08/2018  f/u ov/[facility] re:  COPD  GOLD II with reversiblity  Chief Complaint  Patient presents with  . Follow-up    6 month follow up. Patient states he does still have a cough. Productive cough with clear mucus. Also has some post nasal drip. States he will develop a runny nose.   Dyspnea:  Not limited by breathing from desired activities   Cough: mostly in am / clear mucus x 4y s change assoc with pnds sensation no better with zyrtec or atrovent  Sleeping: 10 degrees on 4 pillows  SABA use: once a week 02: none   No obvious day to day or daytime variability or assoc excess/ purulent sputum or mucus plugs or hemoptysis or cp or chest tightness, subjective wheeze or overt sinus or hb symptoms.   Sleeping ok as above  without nocturnal  or early am exacerbation  of respiratory  c/o's or need for noct saba. Also denies any obvious fluctuation of symptoms with weather or environmental changes or other aggravating or alleviating factors except as outlined above   No unusual exposure hx or h/o childhood pna/ asthma or knowledge of premature birth.  Current Allergies, Complete Past  Medical History, Past Surgical History, Family History, and Social History were reviewed in [employer] record.  ROS  The following are not active complaints unless bolded Hoarseness, sore throat, dysphagia, dental problems, itching, sneezing,  nasal congestion or discharge of excess mucus or purulent secretions, ear ache,   fever, chills, sweats, unintended wt loss or wt gain, classically pleuritic or exertional cp,  orthopnea pnd or arm/hand swelling  or leg swelling, presyncope, palpitations, abdominal pain, anorexia, nausea, vomiting, diarrhea  or change in bowel habits or change in bladder habits, change in stools or change in urine, dysuria, hematuria,  rash, arthralgias, visual complaints, headache, numbness, weakness or ataxia or problems with walking or coordination,   change in mood or  memory.        Current Meds  Medication Sig  . acetaminophen (TYLENOL) 500 MG tablet Take 500 mg by mouth every 6 (six) hours as needed.  Marland Kitchen albuterol (PROVENTIL HFA;VENTOLIN HFA) 108 (90 Base) MCG/ACT inhaler Inhale 2 puffs into the lungs every 4 (four) hours as needed for wheezing or shortness of breath (cough, shortness of breath or wheezing.).  Marland Kitchen albuterol (PROVENTIL) (2.5 MG/3ML) 0.083% nebulizer solution Take 3 mLs (2.5 mg total) by nebulization every 6 (six) hours as needed for wheezing or shortness of breath.  . alfuzosin (UROXATRAL) 10 MG 24 hr tablet   . amLODipine (NORVASC) 10 MG tablet Take 1 tablet (10 mg total) by mouth daily.  Marland Kitchen aspirin EC 81 MG tablet Take 1 tablet (81 mg total) by mouth daily.  . cetirizine (ZYRTEC) 10 MG tablet TAKE 1 TABLET (10 MG TOTAL) BY MOUTH DAILY.  Marland Kitchen Cholecalciferol (VITAMIN D3) 5000 units CAPS Take 1 capsule (5,000 Units total) by mouth daily.  . cilostazol (PLETAL) 50 MG tablet Take 1 tablet (50 mg total) by mouth 2 (two) times daily.  Marland Kitchen diltiazem (DILACOR XR) 240 MG 24 hr capsule Take 1 capsule (240 mg total) by mouth daily.  Marland Kitchen ipratropium (ATROVENT) 0.03 % nasal spray Place 2 sprays into the nose 4 (four) times daily.  . mometasone-formoterol (DULERA) 200-5 MCG/ACT AERO Inhale 2 puffs into the lungs 2 (two) times daily.  Marland Kitchen olmesartan (BENICAR) 40 MG tablet Take 1 tablet (40 mg total) by mouth daily.  .              Objective:   Physical Exam   amb bm nad   07/08/2018       204  12/09/2017       207   09/20/17 202 lb (91.6 kg)  09/19/17 202 lb (91.6 kg)  09/03/17 201 lb 3.2 oz (91.3 kg)    Vital signs reviewed - Note on arrival 02 sats  97% on RA      HEENT: nl   oropharynx. Nl external ear canals without cough reflex/ upper pate/ lower partial - moderate bilateral non-specific turbinate edema     NECK :  without JVD/Nodes/TM/ nl carotid upstrokes bilaterally   LUNGS: no acc muscle use,  Nl contour chest slt  distant bs  bilaterally without cough on insp or exp maneuvers   CV:  RRR  no s3 or murmur or increase in P2, and no edema   ABD:  soft and nontender with nl inspiratory excursion in the supine position. No bruits or organomegaly appreciated, bowel sounds nl  MS:  Nl gait/ ext warm without deformities, calf tenderness, cyanosis or clubbing No obvious joint restrictions   SKIN: warm and dry without lesions  NEURO:  alert, approp, nl sensorium with  no motor or cerebellar deficits apparent.              Assessment:

## 2018-07-08 NOTE — Patient Instructions (Addendum)
Please remember to go to the lab department downstairs in the basement  for your tests - we will call you with the results when they are available.      Keep your appt for the lung cancer screening planned   See if dulera is more affordable otherwise continue symb 160   Please remember to go to the lab department downstairs in the basement  for your tests - we will call you with the results when they are available.      Please schedule a follow up visit in 12 months but call sooner if needed

## 2018-07-09 ENCOUNTER — Encounter: Payer: Self-pay | Admitting: Internal Medicine

## 2018-07-09 LAB — RESPIRATORY ALLERGY PROFILE REGION II ~~LOC~~
Allergen, Cedar tree, t12: <0.1 kU/L
Allergen, D pternoyssinus,d7: 2.69 kU/L — ABNORMAL HIGH
Allergen, Mulberry, t76: <0.1 kU/L
Allergen, P. notatum, m1: <0.1 kU/L
Aspergillus fumigatus, m3: <0.1 kU/L
CLASS: 0
CLASS: 0
CLASS: 0
CLASS: 0
CLASS: 0
CLASS: 0
CLASS: 0
CLASS: 0
CLASS: 0
CLASS: 0
CLASS: 2
COCKROACH: 1.49 kU/L — AB
COMMON RAGWEED (SHORT) (W1) IGE: <0.1 kU/L
Cat Dander: <0.1 kU/L
Class: 0
Class: 0
Class: 0
Class: 0
Class: 0
Class: 0
Class: 0
Class: 0
Class: 0
Class: 0
Class: 0
Class: 2
Class: 2
D. farinae: 2.45 kU/L — ABNORMAL HIGH
DOG DANDER: 0.14 kU/L — AB
Elm IgE: <0.1 kU/L
IGE (IMMUNOGLOBULIN E), SERUM: 212 kU/L — AB (ref <=114)
Rough Pigweed  IgE: <0.1 kU/L

## 2018-07-09 LAB — INTERPRETATION:

## 2018-07-09 NOTE — Assessment & Plan Note (Addendum)
Allergy profile 07/08/2018 >  Eos 0.3 /  IgE  Pending  Likely non-specific and not allergic as no better on zyrtec - stopping smoking will help / check allergy profile to be complete ? Trial of dymsta as already failed atrovent ns

## 2018-07-09 NOTE — Assessment & Plan Note (Signed)
4-5 min discussion re active cigarette smoking in addition to office E&M  Ask about tobacco use:   ongoing Advise quitting   I took an extended  opportunity with this patient to outline the consequences of continued cigarette use  in airway disorders based on all the data we have from the multiple national lung health studies (perfomed over decades at millions of dollars in cost)  indicating that smoking cessation, not choice of inhalers or physicians, is the most important aspect of care.   Assess willingness:  Not committed at this point but considering chantix trial  Assist in quit attempt:  Per PCP when ready Arrange follow up:   Follow up per Primary Care planned

## 2018-07-09 NOTE — Assessment & Plan Note (Signed)
07/19/17 CT Mild centrilobular emphysematous changes, upper lobe predominant. - Spirometry 09/20/2017  FEV1 1.45 (42%)  Ratio 50 with typical curvature > dulera 200 (humana) 2bid   -  PFT's  12/09/2017  FEV1 2.38 (72 % ) ratio 60  p 1 % improvement from saba p symbicort 160 prior to study with DLCO  62/66 % corrects to 71  % for alv volume   - 12/09/2017   continue dulera 200 or symb 160 depending on insurance   - 07/08/2018  After extensive coaching inhaler device,  effectiveness =    90%   Copd/ab still smoking and doing fine on dulera 200 or symb 160 with main issue = affordability of meds  Formulary restrictions will be an ongoing challenge for the forseable future and I would be happy to pick an alternative if the pt will first  provide me a list of them but pt  will need to return here for training for any new device that is required eg dpi vs hfa vs respimat.    In meantime we can always provide samples so the patient never runs out of any needed respiratory medications.     I had an extended discussion with the patient reviewing all relevant studies completed to date and  lasting 15 to 20 minutes of a 25 minute visit    Each maintenance medication was reviewed in detail including most importantly the difference between maintenance and prns and under what circumstances the prns are to be triggered using an action plan format that is not reflected in the computer generated alphabetically organized AVS.     Please see AVS for specific instructions unique to this visit that I personally wrote and verbalized to the the pt in detail and then reviewed with pt  by my nurse highlighting any  changes in therapy recommended at today's visit to their plan of care.

## 2018-07-11 DIAGNOSIS — R351 Nocturia: Secondary | ICD-10-CM | POA: Diagnosis not present

## 2018-07-11 DIAGNOSIS — N401 Enlarged prostate with lower urinary tract symptoms: Secondary | ICD-10-CM | POA: Diagnosis not present

## 2018-07-11 DIAGNOSIS — C61 Malignant neoplasm of prostate: Secondary | ICD-10-CM | POA: Diagnosis not present

## 2018-07-15 ENCOUNTER — Telehealth: Payer: Self-pay | Admitting: Internal Medicine

## 2018-07-15 NOTE — Progress Notes (Signed)
LMTCB

## 2018-07-15 NOTE — Telephone Encounter (Signed)
Called patient and relayed below results per Dr. Melvyn Novas. Patient verbalized understanding. Patient shared that he has a dog so he is worried about how his allergies are going to be in the future. Offered patient an earlier appointment if needed. Patient declined an earlier appointment at this time but will call and schedule one if needed in the future. Nothing further needed.     Notes recorded by Tanda Rockers, MD on 07/15/2018 at 6:12 AM EDT Call patient : Studies are c/w allergies to dust > roaches > dogs > avoidance best rx for now . Be sure patient has/keeps f/u ov so we can go over all the details of this study and get a plan together moving forward - ok to move up f/u if not feeling better and wants to be seen sooner

## 2018-08-07 DIAGNOSIS — J449 Chronic obstructive pulmonary disease, unspecified: Secondary | ICD-10-CM | POA: Diagnosis not present

## 2018-08-27 ENCOUNTER — Telehealth: Payer: Self-pay | Admitting: Family Medicine

## 2018-08-27 NOTE — Telephone Encounter (Signed)
LVM for pt to call back to the office and confirm his appt time change for his appt on 09/16/18 with Dr. Brigitte Pulse.  Old appt time: 3:40 NEW appt time: 3:30  If pt calls back, please enter confirmation into his notes.   Thank you!

## 2018-09-15 ENCOUNTER — Ambulatory Visit: Payer: Medicare HMO | Admitting: Family Medicine

## 2018-09-16 ENCOUNTER — Ambulatory Visit (INDEPENDENT_AMBULATORY_CARE_PROVIDER_SITE_OTHER): Payer: Medicare HMO | Admitting: Family Medicine

## 2018-09-16 ENCOUNTER — Other Ambulatory Visit: Payer: Self-pay

## 2018-09-16 ENCOUNTER — Encounter: Payer: Self-pay | Admitting: Family Medicine

## 2018-09-16 VITALS — BP 138/72 | HR 78 | Temp 98.0°F | Resp 16 | Ht 74.02 in | Wt 203.0 lb

## 2018-09-16 DIAGNOSIS — R7303 Prediabetes: Secondary | ICD-10-CM

## 2018-09-16 DIAGNOSIS — N1831 Chronic kidney disease, stage 3a: Secondary | ICD-10-CM

## 2018-09-16 DIAGNOSIS — N183 Chronic kidney disease, stage 3 (moderate): Secondary | ICD-10-CM | POA: Diagnosis not present

## 2018-09-16 DIAGNOSIS — E785 Hyperlipidemia, unspecified: Secondary | ICD-10-CM | POA: Diagnosis not present

## 2018-09-16 DIAGNOSIS — I10 Essential (primary) hypertension: Secondary | ICD-10-CM | POA: Diagnosis not present

## 2018-09-16 DIAGNOSIS — E559 Vitamin D deficiency, unspecified: Secondary | ICD-10-CM

## 2018-09-16 MED ORDER — CETIRIZINE HCL 10 MG PO TABS: ORAL_TABLET | ORAL | 4 refills | Status: DC

## 2018-09-16 MED ORDER — MONTELUKAST SODIUM 10 MG PO TABS: 10.0000 mg | ORAL_TABLET | Freq: Every day | ORAL | 3 refills | Status: DC

## 2018-09-16 NOTE — Patient Instructions (Addendum)
Restart the rosuvastatin but try just taking 1/2 tab every other day and start coenzyme Q10 along with this to help reduce muscle aches.  We will recheck your cholesterol at your next so make sure to come fasting.     If you have lab work done today you will be contacted with your lab results within the next 2 weeks.  If you have not heard from [place] then please contact us. The fastest way to get your results is to register for My Chart.   IF you received an x-ray today, you will receive an invoice from [facility]. Please contact [facility] at [phone] with questions or concerns regarding your invoice.   IF you received labwork today, you will receive an invoice from [place]. Please contact [facility] at [phone] with questions or concerns regarding your invoice.   Our billing staff will not be able to assist you with questions regarding bills from these companies.  You will be contacted with the lab results as soon as they are available. The fastest way to get your results is to activate your My Chart account. Instructions are located on the last page of this paperwork. If you have not heard from [place] regarding the results in 2 weeks, please contact this office.      Cholesterol Cholesterol is a white, waxy, fat-like substance that is needed by the human body in small amounts. The liver makes all the cholesterol we need. Cholesterol is carried from the liver by the blood through the blood vessels. Deposits of cholesterol (plaques) may build up on blood vessel (artery) walls. Plaques make the arteries narrower and stiffer. Cholesterol plaques increase the risk for heart attack and stroke. You cannot feel your cholesterol level even if it is very high. The only way to know that it is high is to have a blood test. Once you know your cholesterol levels, you should keep a record of the test results. Work with your health care provider to keep your levels in the desired  range. What do the results mean?  Total cholesterol is a rough measure of all the cholesterol in your blood.  LDL (low-density lipoprotein) is the "bad" cholesterol. This is the type that causes plaque to build up on the artery walls. You want this level to be low.  HDL (high-density lipoprotein) is the "good" cholesterol because it cleans the arteries and carries the LDL away. You want this level to be high.  Triglycerides are fat that the body can either burn for energy or store. High levels are closely linked to heart disease. What are the desired levels of cholesterol?  Total cholesterol below 200.  LDL below 100 for people who are at risk, below 70 for people at very high risk.  HDL above 40 is good. A level of 60 or higher is considered to be protective against heart disease.  Triglycerides below 150. How can I lower my cholesterol? Diet Follow your diet program as told by your health care provider.  Choose fish or white meat chicken and [place], roasted or baked. Limit fatty cuts of red meat, fried foods, and processed meats, such as sausage and lunch meats.  Eat lots of fresh fruits and vegetables.  Choose whole grains, beans, pasta, potatoes, and cereals.  Choose olive oil, corn oil, or canola oil, and use only small amounts.  Avoid butter, mayonnaise, shortening, or palm kernel oils.  Avoid foods with trans fats.  Drink skim or nonfat milk and eat low-fat or  nonfat yogurt and cheeses. Avoid whole milk, cream, ice cream, egg yolks, and full-fat cheeses.  Healthier desserts include angel food cake, ginger snaps, animal crackers, hard candy, popsicles, and low-fat or nonfat frozen yogurt. Avoid pastries, cakes, pies, and cookies.  Exercise  Follow your exercise program as told by your health care provider. A regular program: ? Helps to decrease LDL and raise HDL. ? Helps with weight control.  Do things that increase your activity level, such as gardening, walking,  and taking the stairs.  Ask your health care provider about ways that you can be more active in your daily life.  Medicine  Take over-the-counter and prescription medicines only as told by your health care provider. ? Medicine may be prescribed by your health care provider to help lower cholesterol and decrease the risk for heart disease. This is usually done if diet and exercise have failed to bring down cholesterol levels. ? If you have several risk factors, you may need medicine even if your levels are normal.  This information is not intended to replace advice given to you by your health care provider. Make sure you discuss any questions you have with your health care provider. Document Released: 07/24/2001 Document Revised: 05/26/2016 Document Reviewed: 04/28/2016 Elsevier Interactive Patient Education  [employer].

## 2018-09-16 NOTE — Progress Notes (Signed)
Subjective:    Patient: Andrew [name]  DOB: 1951/08/23; 67 y.o.   MRN: [id number]  Chief Complaint  Patient presents with  . Chronic Kidney Disease    6 month follow-up   . Hypertension  . Hyperlipidemia    HPI Has another prostate biopsy scheduled for February.  Not fasting Hears pulse in ears but not bad and is going away especially when he puts pressure in his ears. The drops help some.  DOes check BP outside of office some and runnings 130s-140s/70 - sometimes 129/75 - but when he was on 3 BP pills he had several presyncopal episodes. Using mucinex to loosen up congestion so breathing is getting a lot better He is taking vitamin D 5000u daily. Medical History Past Medical History:  Diagnosis Date  . Asthma   . Carotid artery stenosis, asymptomatic, bilateral 12/06/2017   Consider recheck Dopplers in 6 months -> 05/2018 Mild to moderate bilateral carotid artery stenosis Carotid Dopplers done at [facility] 11/26/2017 show left internal carotid artery stenosis 50-69% with soft plaque.  Mild stenosis in the left common carotid artery <50%. Mild stenosis in the right common carotid artery <50% with moderate diffuse soft plaque.  Mild stenosis in the right e  . COCAINE ABUSE 01/30/2008   Qualifier: Diagnosis of  By: Amil Amen MD, Benjamine Mola    . COPD (chronic obstructive pulmonary disease) ([place])   . Hypertension    History reviewed. No pertinent surgical history. Current Outpatient Medications on File Prior to Visit  Medication Sig Dispense Refill  . acetaminophen (TYLENOL) 500 MG tablet Take 500 mg by mouth every 6 (six) hours as needed.    Marland Kitchen albuterol (PROVENTIL HFA;VENTOLIN HFA) 108 (90 Base) MCG/ACT inhaler Inhale 2 puffs into the lungs every 4 (four) hours as needed for wheezing or shortness of breath (cough, shortness of breath or wheezing.). 1 Inhaler 11  . albuterol (PROVENTIL) (2.5 MG/3ML) 0.083% nebulizer solution Take 3 mLs (2.5 mg total) by nebulization every  6 (six) hours as needed for wheezing or shortness of breath. 150 mL 2  . alfuzosin (UROXATRAL) 10 MG 24 hr tablet     . amLODipine (NORVASC) 10 MG tablet Take 1 tablet (10 mg total) by mouth daily. 90 tablet 1  . aspirin EC 81 MG tablet Take 1 tablet (81 mg total) by mouth daily.    . cetirizine (ZYRTEC) 10 MG tablet TAKE 1 TABLET (10 MG TOTAL) BY MOUTH DAILY. 90 tablet 4  . Cholecalciferol (VITAMIN D3) 5000 units CAPS Take 1 capsule (5,000 Units total) by mouth daily. 180 capsule 0  . cilostazol (PLETAL) 50 MG tablet Take 1 tablet (50 mg total) by mouth 2 (two) times daily.    Marland Kitchen diltiazem (DILACOR XR) 240 MG 24 hr capsule Take 1 capsule (240 mg total) by mouth daily. 90 capsule 1  . ipratropium (ATROVENT) 0.03 % nasal spray Place 2 sprays into the nose 4 (four) times daily. 30 mL 1  . mometasone-formoterol (DULERA) 200-5 MCG/ACT AERO Inhale 2 puffs into the lungs 2 (two) times daily. 1 Inhaler 11  . olmesartan (BENICAR) 40 MG tablet Take 1 tablet (40 mg total) by mouth daily. 90 tablet 1   No current facility-administered medications on file prior to visit.    Allergies  Allergen Reactions  . Lisinopril Cough  . Lisinopril-Hydrochlorothiazide     dizziness   History reviewed. No pertinent family history. Social History   Socioeconomic History  . Marital status: Legally Separated    Spouse  name: Not on file  . Number of children: Not on file  . Years of education: Not on file  . Highest education level: Not on file  Occupational History  . Not on file  Social Needs  . Financial resource strain: Not on file  . Food insecurity:    Worry: Not on file    Inability: Not on file  . Transportation needs:    Medical: Not on file    Non-medical: Not on file  Tobacco Use  . Smoking status: Current Some Day Smoker    Packs/day: 0.75    Years: 44.00    Pack years: 33.00    Types: Cigarettes  . Smokeless tobacco: Never Used  Substance and Sexual Activity  . Alcohol use: Yes     Comment: occasional  . Drug use: No  . Sexual activity: Not on file  Lifestyle  . Physical activity:    Days per week: Not on file    Minutes per session: Not on file  . Stress: Not on file  Relationships  . Social connections:    Talks on phone: Not on file    Gets together: Not on file    Attends religious service: Not on file    Active member of club or organization: Not on file    Attends meetings of clubs or organizations: Not on file    Relationship status: Not on file  Other Topics Concern  . Not on file  Social History Narrative  . Not on file   Depression screen [facility] 2/9 09/16/2018 03/17/2018 01/13/2018 11/14/2017 10/17/2017  Decreased Interest 0 0 0 0 0  Down, Depressed, Hopeless 0 0 0 0 0  PHQ - 2 Score 0 0 0 0 0    ROS As noted in HPI  Objective:  BP 138/72   Pulse 78   Temp 98 F (36.7 C) (Oral)   Resp 16   Ht 6' 2.02" (1.88 m)   Wt 203 lb (92.1 kg)   SpO2 97%   BMI 26.05 kg/m  Physical Exam  Constitutional: He is oriented to person, place, and time. He appears well-developed and well-nourished. No distress.  HENT:  Head: Normocephalic and atraumatic.  Eyes: Pupils are equal, round, and reactive to light. Conjunctivae are normal. No scleral icterus.  Neck: Normal range of motion. Neck supple. No thyromegaly present.  Cardiovascular: Normal rate, regular rhythm, normal heart sounds and intact distal pulses.  Pulmonary/Chest: Effort normal and breath sounds normal. No respiratory distress.  Musculoskeletal: He exhibits no edema.  Lymphadenopathy:    He has no cervical adenopathy.  Neurological: He is alert and oriented to person, place, and time.  Skin: Skin is warm and dry. He is not diaphoretic.  Psychiatric: He has a normal mood and affect. His behavior is normal.    [place] [facility] Office Visit on 09/16/2018  Component Date Value Ref Range Status  . Glucose 09/16/2018 92  65 - 99 mg/dL Final  . BUN 09/16/2018 16  8 - 27 mg/dL Final  . Creatinine, Ser  09/16/2018 1.47* 0.76 - 1.27 mg/dL Final  . GFR calc non Af Amer 09/16/2018 49* >59 mL/min/1.73 Final  . GFR calc Af Amer 09/16/2018 56* >59 mL/min/1.73 Final  . BUN/Creatinine Ratio 09/16/2018 11  10 - 24 Final  . Sodium 09/16/2018 140  134 - 144 mmol/L Final  . Potassium 09/16/2018 4.3  3.5 - 5.2 mmol/L Final  . Chloride 09/16/2018 104  96 - 106 mmol/L Final  .  CO2 09/16/2018 21  20 - 29 mmol/L Final  . Calcium 09/16/2018 9.1  8.6 - 10.2 mg/dL Final  . Total Protein 09/16/2018 6.6  6.0 - 8.5 g/dL Final  . Albumin 09/16/2018 4.1  3.6 - 4.8 g/dL Final  . Globulin, Total 09/16/2018 2.5  1.5 - 4.5 g/dL Final  . Albumin/Globulin Ratio 09/16/2018 1.6  1.2 - 2.2 Final  . Bilirubin Total 09/16/2018 <0.2  0.0 - 1.2 mg/dL Final  . Alkaline Phosphatase 09/16/2018 123* 39 - 117 IU/L Final  . AST 09/16/2018 16  0 - 40 IU/L Final  . ALT 09/16/2018 14  0 - 44 IU/L Final  . Vit D, 25-Hydroxy 09/16/2018 70.1  30.0 - 100.0 ng/mL Final   Comment: Vitamin D deficiency has been defined by the [place] practice guideline as a level of serum 25-OH vitamin D less than 20 ng/mL (1,2). The Endocrine Society went on to further define vitamin D insufficiency as a level between 21 and 29 ng/mL (2). 1. IOM (Institute of Medicine). 2010. Dietary reference    intakes for calcium and D. [place]: The    [employer]. 2. Holick MF, Binkley [place], Bischoff-Ferrari HA, et al.    Evaluation, treatment, and prevention of vitamin D    deficiency: an Endocrine Society clinical practice    guideline. JCEM. 2011 Jul; 96(7):1911-30.   Marland Kitchen Hgb A1c MFr Bld 09/16/2018 5.7* 4.8 - 5.6 % Final   Comment:          Prediabetes: 5.7 - 6.4          Diabetes: >6.4          Glycemic control for adults with diabetes: <7.0   . Est. average glucose Bld gHb Est-m* 09/16/2018 117  mg/dL Final     Assessment & Plan:   1. Essential hypertension   2. Chronic kidney disease (CKD) stage  G3a/A2, moderately decreased glomerular filtration rate (GFR) between 45-59 mL/min/1.73 square meter and albuminuria creatinine ratio between 30-299 mg/g (HCC)   3. Hyperlipidemia LDL goal <70   4. Prediabetes   5. Vitamin D deficiency     Patient will continue on current chronic medications other than changes noted above, so ok to refill when needed.   See after visit summary for patient specific instructions.  Orders Placed This Encounter  Procedures  . Comprehensive metabolic panel  . Vitamin D, 25-hydroxy  . Hemoglobin A1c    Meds ordered this encounter  Medications  . montelukast (SINGULAIR) 10 MG tablet    Sig: Take 1 tablet (10 mg total) by mouth at bedtime.    Dispense:  90 tablet    Refill:  3  . cetirizine (ZYRTEC) 10 MG tablet    Sig: TAKE 1 TABLET (10 MG TOTAL) BY MOUTH DAILY.    Dispense:  90 tablet    Refill:  4    Patient verbalized to me that they understand the following: diagnosis, what is being done for them, what to expect and what should be done at home.  Their questions have been answered. They understand that I am unable to predict every possible medication interaction or adverse outcome and that if any unexpected symptoms arise, they should contact us and their pharmacist, as well as never hesitate to seek urgent/emergent care at [facility] or ER if they think it might be warranted.    Delman Cheadle, MD, MPH [facility]  [place], [place]  [phone] Office phone  [phone] Office fax  09/16/18 4:12 PM

## 2018-09-17 LAB — COMPREHENSIVE METABOLIC PANEL
ALK PHOS: 123 IU/L — AB (ref 39–117)
ALT: 14 IU/L (ref 0–44)
AST: 16 IU/L (ref 0–40)
Albumin/Globulin Ratio: 1.6 (ref 1.2–2.2)
Albumin: 4.1 g/dL (ref 3.6–4.8)
BUN / CREAT RATIO: 11 (ref 10–24)
BUN: 16 mg/dL (ref 8–27)
CHLORIDE: 104 mmol/L (ref 96–106)
CO2: 21 mmol/L (ref 20–29)
Calcium: 9.1 mg/dL (ref 8.6–10.2)
Creatinine, Ser: 1.47 mg/dL — ABNORMAL HIGH (ref 0.76–1.27)
GFR, EST AFRICAN AMERICAN: 56 mL/min/{1.73_m2} — AB (ref >59)
GFR, EST NON AFRICAN AMERICAN: 49 mL/min/{1.73_m2} — AB (ref >59)
GLUCOSE: 92 mg/dL (ref 65–99)
Globulin, Total: 2.5 g/dL (ref 1.5–4.5)
POTASSIUM: 4.3 mmol/L (ref 3.5–5.2)
SODIUM: 140 mmol/L (ref 134–144)
Total Protein: 6.6 g/dL (ref 6.0–8.5)

## 2018-09-17 LAB — HEMOGLOBIN A1C
Est. average glucose Bld gHb Est-mCnc: 117 mg/dL
Hgb A1c MFr Bld: 5.7 % — ABNORMAL HIGH (ref 4.8–5.6)

## 2018-09-17 LAB — VITAMIN D 25 HYDROXY (VIT D DEFICIENCY, FRACTURES): Vit D, 25-Hydroxy: 70.1 ng/mL (ref 30.0–100.0)

## 2018-10-02 ENCOUNTER — Telehealth: Payer: Self-pay | Admitting: Family Medicine

## 2018-10-02 NOTE — Telephone Encounter (Signed)
LVM for pt to call the office and reschedule their app that is scheduled for 01/15/19. Due to providers schedule change, Dr. Brigitte Pulse will be unavailable that day. When pt calls back, please reschedule at their convenience. Thank you!

## 2018-10-14 ENCOUNTER — Ambulatory Visit: Payer: Self-pay

## 2018-10-14 NOTE — Telephone Encounter (Signed)
Pt called with questions regarding what cold medications that he can take if he has HBP.  Per reference pt can take Coricidin HBP for cough and congestion and also Delsym. Also he wanted to know if he can take Tylenol 500 mg tab for body aches. Advised using a nasal saline spray to help with nasal secretions.  And to use honey with hot tea, honey cough drops also. Pt voiced understanding. Advised to call back for any other concerns. Pt voice understanding. Reason for Disposition . General information question, no triage required and triager able to answer question  Answer Assessment - Initial Assessment Questions 1. REASON FOR CALL or QUESTION: "What is your reason for calling today?" or "How can I best help you?" or "What question do you have that I can help answer?"     Pt calling for what medications to take for cough and nasal congestions with his high blood pressure.  Protocols used: INFORMATION ONLY CALL-A-AH

## 2018-10-14 NOTE — Telephone Encounter (Signed)
Patient called, left VM to return call to the office to speak to a TN about his cold symptoms.

## 2018-10-15 NOTE — Telephone Encounter (Signed)
Please advise 

## 2018-12-23 ENCOUNTER — Ambulatory Visit: Payer: Self-pay

## 2018-12-23 NOTE — Telephone Encounter (Signed)
Patient calling and states that he is having a runny nose, chest congestion , coughing up phlegm. States that he is taking coricidin and it does not seem to be helping. Would like to know what he could take? Mentions possible zpack. Advised that he would need an appointment before a zpack could be sent. Denied making an appointment at this time.      Incoming call from Patient with complaint of  Runny nose, coughing up green phlegm.  Denies fever.  Reports chest  Congestion,  And wheezing. Patient inquiring  If  He could get a Rx of  ZPak .  Patient  Scheduled for appointment  Wednesday  12/24/18 Wednesday.  @ 405 pm. Patient  Voiced understanding.   Reason for Disposition . [1] Using nasal washes and pain medicine > 24 hours AND [2] sinus pain (lower forehead, cheekbone, or eye) persists  Answer Assessment - Initial Assessment Questions 1. ONSET: "When did the nasal discharge start?"      Couple weeks ago,  2. AMOUNT: "How much discharge is there?"      Not alot 3. COUGH: "Do you have a cough?" If yes, ask: "Describe the color of your sputum" (clear, white, yellow, green)      Green  4. RESPIRATORY DISTRESS: "Describe your breathing."      Fine,  5. FEVER: "Do you have a fever?" If so, ask: "What is your temperature, how was it measured, and when did it start?"     denies 6. SEVERITY: "Overall, how bad are you feeling right now?" (e.g., doesn't interfere with normal activities, staying home from school/work, staying in bed)      Pretty good 7. OTHER SYMPTOMS: "Do you have any other symptoms?" (e.g., sore throat, earache, wheezing, vomiting)     Wheezing,  8. PREGNANCY: "Is there any chance you are pregnant?" "When was your last menstrual period?"     na  Protocols used: COMMON COLD-A-AH

## 2018-12-24 ENCOUNTER — Encounter: Payer: Self-pay | Admitting: Family Medicine

## 2018-12-24 ENCOUNTER — Ambulatory Visit (INDEPENDENT_AMBULATORY_CARE_PROVIDER_SITE_OTHER): Payer: Medicare HMO | Admitting: Family Medicine

## 2018-12-24 ENCOUNTER — Other Ambulatory Visit: Payer: Self-pay

## 2018-12-24 VITALS — BP 160/70 | HR 71 | Temp 98.0°F | Resp 17 | Ht 74.02 in | Wt 203.6 lb

## 2018-12-24 DIAGNOSIS — J301 Allergic rhinitis due to pollen: Secondary | ICD-10-CM | POA: Diagnosis not present

## 2018-12-24 DIAGNOSIS — J0110 Acute frontal sinusitis, unspecified: Secondary | ICD-10-CM

## 2018-12-24 DIAGNOSIS — I10 Essential (primary) hypertension: Secondary | ICD-10-CM

## 2018-12-24 MED ORDER — AZITHROMYCIN 250 MG PO TABS: ORAL_TABLET | ORAL | 0 refills | Status: DC

## 2018-12-24 NOTE — Patient Instructions (Addendum)
If you have lab work done today you will be contacted with your lab results within the next 2 weeks.  If you have not heard from [place] then please contact us. The fastest way to get your results is to register for My Chart.   IF you received an x-ray today, you will receive an invoice from [facility]. Please contact [facility] at [phone] with questions or concerns regarding your invoice.   IF you received labwork today, you will receive an invoice from [facility]. Please contact [facility] at [phone] with questions or concerns regarding your invoice.   Our billing staff will not be able to assist you with questions regarding bills from these companies.  You will be contacted with the lab results as soon as they are available. The fastest way to get your results is to activate your My Chart account. Instructions are located on the last page of this paperwork. If you have not heard from [place] regarding the results in 2 weeks, please contact this office.     Sinusitis, Adult Sinusitis is inflammation of your sinuses. Sinuses are hollow spaces in the bones around your face. Your sinuses are located:  Around your eyes.  In the middle of your forehead.  Behind your nose.  In your cheekbones. Mucus normally drains out of your sinuses. When your nasal tissues become inflamed or swollen, mucus can become trapped or blocked. This allows bacteria, viruses, and fungi to grow, which leads to infection. Most infections of the sinuses are caused by a virus. Sinusitis can develop quickly. It can last for up to 4 weeks (acute) or for more than 12 weeks (chronic). Sinusitis often develops after a cold. What are the causes? This condition is caused by anything that creates swelling in the sinuses or stops mucus from draining. This includes:  Allergies.  Asthma.  Infection from bacteria or viruses.  Deformities or blockages in your nose or sinuses.  Abnormal growths in the  nose (nasal polyps).  Pollutants, such as chemicals or irritants in the air.  Infection from fungi (rare). What increases the risk? You are more likely to develop this condition if you:  Have a weak body defense system (immune system).  Do a lot of swimming or diving.  Overuse nasal sprays.  Smoke. What are the signs or symptoms? The main symptoms of this condition are pain and a feeling of pressure around the affected sinuses. Other symptoms include:  Stuffy nose or congestion.  Thick drainage from your nose.  Swelling and warmth over the affected sinuses.  Headache.  Upper toothache.  A cough that may get worse at night.  Extra mucus that collects in the throat or the back of the nose (postnasal drip).  Decreased sense of smell and taste.  Fatigue.  A fever.  Sore throat.  Bad breath. How is this diagnosed? This condition is diagnosed based on:  Your symptoms.  Your medical history.  A physical exam.  Tests to find out if your condition is acute or chronic. This may include: ? Checking your nose for nasal polyps. ? Viewing your sinuses using a device that has a light (endoscope). ? Testing for allergies or bacteria. ? Imaging tests, such as an MRI or CT scan. In rare cases, a bone biopsy may be done to rule out more serious types of fungal sinus disease. How is this treated? Treatment for sinusitis depends on the cause and whether your condition is chronic or acute.  If caused  by a virus, your symptoms should go away on their own within 10 days. You may be given medicines to relieve symptoms. They include: ? Medicines that shrink swollen nasal passages (topical intranasal decongestants). ? Medicines that treat allergies (antihistamines). ? A spray that eases inflammation of the nostrils (topical intranasal corticosteroids). ? Rinses that help get rid of thick mucus in your nose (nasal saline washes).  If caused by bacteria, your health care provider  may recommend waiting to see if your symptoms improve. Most bacterial infections will get better without antibiotic medicine. You may be given antibiotics if you have: ? A severe infection. ? A weak immune system.  If caused by narrow nasal passages or nasal polyps, you may need to have surgery. Follow these instructions at home: Medicines  Take, use, or apply over-the-counter and prescription medicines only as told by your health care provider. These may include nasal sprays.  If you were prescribed an antibiotic medicine, take it as told by your health care provider. Do not stop taking the antibiotic even if you start to feel better. Hydrate and humidify   Drink enough fluid to keep your urine pale yellow. Staying hydrated will help to thin your mucus.  Use a cool mist humidifier to keep the humidity level in your home above 50%.  Inhale steam for 10-15 minutes, 3-4 times a day, or as told by your health care provider. You can do this in the bathroom while a hot shower is running.  Limit your exposure to cool or dry air. Rest  Rest as much as possible.  Sleep with your head raised (elevated).  Make sure you get enough sleep each night. General instructions   Apply a warm, moist washcloth to your face 3-4 times a day or as told by your health care provider. This will help with discomfort.  Wash your hands often with soap and water to reduce your exposure to germs. If soap and water are not available, use hand sanitizer.  Do not smoke. Avoid being around people who are smoking (secondhand smoke).  Keep all follow-up visits as told by your health care provider. This is important. Contact a health care provider if:  You have a fever.  Your symptoms get worse.  Your symptoms do not improve within 10 days. Get help right away if:  You have a severe headache.  You have persistent vomiting.  You have severe pain or swelling around your face or eyes.  You have vision  problems.  You develop confusion.  Your neck is stiff.  You have trouble breathing. Summary  Sinusitis is soreness and inflammation of your sinuses. Sinuses are hollow spaces in the bones around your face.  This condition is caused by nasal tissues that become inflamed or swollen. The swelling traps or blocks the flow of mucus. This allows bacteria, viruses, and fungi to grow, which leads to infection.  If you were prescribed an antibiotic medicine, take it as told by your health care provider. Do not stop taking the antibiotic even if you start to feel better.  Keep all follow-up visits as told by your health care provider. This is important. This information is not intended to replace advice given to you by your health care provider. Make sure you discuss any questions you have with your health care provider. Document Released: 10/29/2005 Document Revised: 03/31/2018 Document Reviewed: 03/31/2018 Elsevier Interactive Patient Education  2019 [employer].

## 2018-12-24 NOTE — Progress Notes (Signed)
Established Patient Office Visit  Subjective:  Patient ID: Andrew [name], adult    DOB: [birth year]  Age: 68 y.o. MRN: [id number]  CC:  Chief Complaint  Patient presents with  . URI    x 3 weeks, worse last week, still continuing to have intermittent rn and cough, taking otc cough gtts and coricidin hb or sxs    HPI Andrew [name] presents for   Acute URI Pt has been coughing for 3 weeks States that he gets phlegm that is occasionally white but mostly yellow He took otc cough drops and chlorcedin  He states that he smokes a pack a week He denies fevers or chills He denies wheezing or SOB  Hypertension: Patient here for follow-up of elevated blood pressure. She is exercising and is adherent to low salt diet.  Blood pressure is well controlled at home. Cardiac symptoms none. Patient denies chest pain, chest pressure/discomfort, claudication, exertional chest pressure/discomfort, lower extremity edema, near-syncope and palpitations.  Cardiovascular risk factors: hypertension. Use of agents associated with hypertension: decongestants and NSAIDS. History of target organ damage: none. BP Readings from Last 3 Encounters:  12/24/18 (!) 160/70  09/16/18 138/72  07/08/18 130/72     Past Medical History:  Diagnosis Date  . Asthma   . Carotid artery stenosis, asymptomatic, bilateral 12/06/2017   Consider recheck Dopplers in 6 months -> 05/2018 Mild to moderate bilateral carotid artery stenosis Carotid Dopplers done at [facility] cardiovascular 11/26/2017 show left internal carotid artery stenosis 50-69% with soft plaque.  Mild stenosis in the left common carotid artery <50%. Mild stenosis in the [name] common carotid artery <50% with moderate diffuse soft plaque.  Mild stenosis in the [name] e  . COCAINE ABUSE 01/30/2008   Qualifier: Diagnosis of  By: Amil Amen MD, Benjamine Mola    . COPD (chronic obstructive pulmonary disease) ([place])   . Hypertension     No past surgical history on  file.  No family history on file.  Social History   Socioeconomic History  . Marital status: Legally Separated    Spouse name: Not on file  . Number of children: Not on file  . Years of education: Not on file  . Highest education level: Not on file  Occupational History  . Not on file  Social Needs  . Financial resource strain: Not on file  . Food insecurity:    Worry: Not on file    Inability: Not on file  . Transportation needs:    Medical: Not on file    Non-medical: Not on file  Tobacco Use  . Smoking status: Current Some Day Smoker    Packs/day: 0.75    Years: 44.00    Pack years: 33.00    Types: Cigarettes  . Smokeless tobacco: Never Used  Substance and Sexual Activity  . Alcohol use: Yes    Comment: occasional  . Drug use: No  . Sexual activity: Not on file  Lifestyle  . Physical activity:    Days per week: Not on file    Minutes per session: Not on file  . Stress: Not on file  Relationships  . Social connections:    Talks on phone: Not on file    Gets together: Not on file    Attends religious service: Not on file    Active member of club or organization: Not on file    Attends meetings of clubs or organizations: Not on file    Relationship status: Not on file  . Intimate  partner violence:    Fear of current or ex partner: Not on file    Emotionally abused: Not on file    Physically abused: Not on file    Forced sexual activity: Not on file  Other Topics Concern  . Not on file  Social History Narrative  . Not on file    Outpatient Medications Prior to Visit  Medication Sig Dispense Refill  . acetaminophen (TYLENOL) 500 MG tablet Take 500 mg by mouth every 6 (six) hours as needed.    Marland Kitchen albuterol (PROVENTIL HFA;VENTOLIN HFA) 108 (90 Base) MCG/ACT inhaler Inhale 2 puffs into the lungs every 4 (four) hours as needed for wheezing or shortness of breath (cough, shortness of breath or wheezing.). 1 Inhaler 11  . albuterol (PROVENTIL) (2.5 MG/3ML) 0.083%  nebulizer solution Take 3 mLs (2.5 mg total) by nebulization every 6 (six) hours as needed for wheezing or shortness of breath. 150 mL 2  . alfuzosin (UROXATRAL) 10 MG 24 hr tablet     . amLODipine (NORVASC) 10 MG tablet Take 1 tablet (10 mg total) by mouth daily. 90 tablet 1  . aspirin EC 81 MG tablet Take 1 tablet (81 mg total) by mouth daily.    . cetirizine (ZYRTEC) 10 MG tablet TAKE 1 TABLET (10 MG TOTAL) BY MOUTH DAILY. 90 tablet 4  . Cholecalciferol (VITAMIN D3) 5000 units CAPS Take 1 capsule (5,000 Units total) by mouth daily. 180 capsule 0  . cilostazol (PLETAL) 50 MG tablet Take 1 tablet (50 mg total) by mouth 2 (two) times daily.    Marland Kitchen diltiazem (DILACOR XR) 240 MG 24 hr capsule Take 1 capsule (240 mg total) by mouth daily. 90 capsule 1  . ipratropium (ATROVENT) 0.03 % nasal spray Place 2 sprays into the nose 4 (four) times daily. 30 mL 1  . mometasone-formoterol (DULERA) 200-5 MCG/ACT AERO Inhale 2 puffs into the lungs 2 (two) times daily. 1 Inhaler 11  . montelukast (SINGULAIR) 10 MG tablet Take 1 tablet (10 mg total) by mouth at bedtime. 90 tablet 3  . olmesartan (BENICAR) 40 MG tablet Take 1 tablet (40 mg total) by mouth daily. 90 tablet 1   No facility-administered medications prior to visit.     Allergies  Allergen Reactions  . Lisinopril Cough  . Lisinopril-Hydrochlorothiazide     dizziness    ROS Review of Systems See hpi   Objective:    Physical Exam  BP (!) 160/70 (BP Location: [name] Arm, Patient Position: Sitting)   Pulse 71   Temp 98 F (36.7 C) (Oral)   Resp 17   Ht 6' 2.02" (1.88 m)   Wt 203 lb 9.6 oz (92.4 kg)   SpO2 94%   BMI 26.13 kg/m  Wt Readings from Last 3 Encounters:  12/24/18 203 lb 9.6 oz (92.4 kg)  09/16/18 203 lb (92.1 kg)  07/08/18 204 lb (92.5 kg)   General: alert, oriented, in NAD Head: normocephalic, atraumatic, ++frontal sinus tenderness Eyes: EOM intact, no scleral icterus or conjunctival injection Ears: TM clear  bilaterally Nose: mucosa nonerythematous, nonedematous Throat: no pharyngeal exudate or erythema Lymph: no posterior auricular, submental or cervical lymph adenopathy Heart: normal rate, normal sinus rhythm, no murmurs Lungs: clear to auscultation bilaterally, no wheezing    Health Maintenance Due  Topic Date Due  . TETANUS/TDAP  02/05/1970  . MAMMOGRAM  02/05/2001  . Fecal DNA (Cologuard)  02/05/2001  . DEXA SCAN  02/06/2016  . INFLUENZA VACCINE  06/12/2018  . PNA vac  Low Risk Adult (2 of 2 - PPSV23) 09/02/2018    There are no preventive care reminders to display for this patient.  No results found for: TSH Lab Results  Component Value Date   WBC 6.3 07/08/2018   HGB 14.2 07/08/2018   HCT 41.8 07/08/2018   MCV 85.3 07/08/2018   PLT 268.0 07/08/2018   Lab Results  Component Value Date   NA 140 09/16/2018   K 4.3 09/16/2018   CO2 21 09/16/2018   GLUCOSE 92 09/16/2018   BUN 16 09/16/2018   CREATININE 1.47 (H) 09/16/2018   BILITOT <0.2 09/16/2018   ALKPHOS 123 (H) 09/16/2018   AST 16 09/16/2018   ALT 14 09/16/2018   PROT 6.6 09/16/2018   ALBUMIN 4.1 09/16/2018   CALCIUM 9.1 09/16/2018   ANIONGAP 10 07/21/2017   Lab Results  Component Value Date   CHOL 236 (H) 06/29/2017   Lab Results  Component Value Date   HDL 57 06/29/2017   Lab Results  Component Value Date   LDLCALC 162 (H) 06/29/2017   Lab Results  Component Value Date   TRIG 85 06/29/2017   Lab Results  Component Value Date   CHOLHDL 4.1 06/29/2017   Lab Results  Component Value Date   HGBA1C 5.7 (H) 09/16/2018      Assessment & Plan:   Problem List Items Addressed This Visit      Cardiovascular and Mediastinum   Essential hypertension     Respiratory   Allergic rhinitis - Primary    Other Visit Diagnoses    Acute non-recurrent frontal sinusitis       Relevant Medications   azithromycin (ZITHROMAX) 250 MG tablet    Will treat for bacterial infection Advised continuing allergy  meds Follow up for bp check in one month  Meds ordered this encounter  Medications  . azithromycin (ZITHROMAX) 250 MG tablet    Sig: Take 2 tablets on day 1, one tablet daily    Dispense:  6 tablet    Refill:  0    Follow-up: Return in about 4 weeks (around 01/21/2019) for blood pressure check.    Forrest Moron, MD

## 2019-01-01 ENCOUNTER — Other Ambulatory Visit: Payer: Self-pay | Admitting: Family Medicine

## 2019-01-01 MED ORDER — OLMESARTAN MEDOXOMIL 40 MG PO TABS: 40.0000 mg | ORAL_TABLET | Freq: Every day | ORAL | 1 refills | Status: DC

## 2019-01-01 MED ORDER — DILTIAZEM HCL ER 240 MG PO CP24: 240.0000 mg | ORAL_CAPSULE | Freq: Every day | ORAL | 1 refills | Status: DC

## 2019-01-01 MED ORDER — AMLODIPINE BESYLATE 10 MG PO TABS: 10.0000 mg | ORAL_TABLET | Freq: Every day | ORAL | 1 refills | Status: DC

## 2019-01-01 NOTE — Telephone Encounter (Signed)
Copied from [place] [id number]. Topic: Quick Communication - Rx Refill/Question >> Jan 01, 2019  4:32 PM Keene Breath wrote: Medication: amLODipine (NORVASC) 10 MG tablet / diltiazem (DILACOR XR) 240 MG 24 hr capsule / olmesartan (BENICAR) 40 MG tablet  Patient called to request a refill for the above medication  Preferred Pharmacy (with phone number or street name): [id number] [place], [place] - [address] [phone] (Phone) [phone] (Fax)

## 2019-01-15 ENCOUNTER — Ambulatory Visit: Payer: Medicare HMO | Admitting: Family Medicine

## 2019-01-16 ENCOUNTER — Ambulatory Visit: Payer: Medicare HMO | Admitting: Family Medicine

## 2019-01-29 ENCOUNTER — Other Ambulatory Visit: Payer: Self-pay

## 2019-01-29 ENCOUNTER — Encounter: Payer: Self-pay | Admitting: Family Medicine

## 2019-01-29 ENCOUNTER — Ambulatory Visit (INDEPENDENT_AMBULATORY_CARE_PROVIDER_SITE_OTHER): Payer: Medicare HMO

## 2019-01-29 ENCOUNTER — Ambulatory Visit (INDEPENDENT_AMBULATORY_CARE_PROVIDER_SITE_OTHER): Payer: Medicare HMO | Admitting: Family Medicine

## 2019-01-29 VITALS — BP 154/78 | HR 76 | Temp 98.2°F | Resp 20 | Ht 74.21 in | Wt 201.0 lb

## 2019-01-29 DIAGNOSIS — I10 Essential (primary) hypertension: Secondary | ICD-10-CM | POA: Diagnosis not present

## 2019-01-29 DIAGNOSIS — R05 Cough: Secondary | ICD-10-CM | POA: Diagnosis not present

## 2019-01-29 DIAGNOSIS — M533 Sacrococcygeal disorders, not elsewhere classified: Secondary | ICD-10-CM | POA: Diagnosis not present

## 2019-01-29 DIAGNOSIS — R059 Cough, unspecified: Secondary | ICD-10-CM

## 2019-01-29 MED ORDER — HYDROCHLOROTHIAZIDE 12.5 MG PO TABS: 12.5000 mg | ORAL_TABLET | Freq: Every day | ORAL | 1 refills | Status: DC

## 2019-01-29 NOTE — Progress Notes (Signed)
Established Patient Office Visit  Subjective:  Patient ID: Andrew [name], adult    DOB: 09/23/1951  Age: 68 y.o. MRN: [id number]  CC:  Chief Complaint  Patient presents with  . Hypertension    f/u  . Nasal Congestion    X 1 mth pt states more of chest congestion  . Flank Pain    X 2 weeks left side    HPI Andrew [name] presents for   Hypertension: Patient here for follow-up of elevated blood pressure.  Patient is exercising and is adherent to low salt diet.  Blood pressure is not well controlled at home. Cardiac symptoms none. Patient denies chest pain, chest pressure/discomfort, claudication and exertional chest pressure/discomfort.  Cardiovascular risk factors: advanced age (older than 72 for men, 70 for women) and hypertension. Use of agents associated with hypertension: none. History of target organ damage: none. 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 He states that he has flank pain on the left side He reports that he wakes up once per night The pain in the back on the left side is with movement when he first got out of bed and did a twisting motion He states that the pain has been intermittent and aggravated by  Movement Onset 2 weeks ago  cough He also reports that he has more chest and nasal congestion  That has been there for 2 months He reports that the mucus is a thick white  He coughs and spit up mucus He is a smoker of about 7 cigarettes a day  No fevers  No recent travel No sick exposures   Past Medical History:  Diagnosis Date  . Asthma   . Carotid artery stenosis, asymptomatic, bilateral 12/06/2017   Consider recheck Dopplers in 6 months -> 05/2018 Mild to moderate bilateral carotid artery stenosis Carotid Dopplers done at [facility] cardiovascular 11/26/2017 show left internal carotid artery stenosis 50-69% with soft  plaque.  Mild stenosis in the left common carotid artery <50%. Mild stenosis in the right common carotid artery <50% with moderate diffuse soft plaque.  Mild stenosis in the right e  . COCAINE ABUSE 01/30/2008   Qualifier: Diagnosis of  By: Amil Amen MD, Benjamine Mola    . COPD (chronic obstructive pulmonary disease) ([place])   . Hypertension     History reviewed. No pertinent surgical history.  History reviewed. No pertinent family history.  Social History   Socioeconomic History  . Marital status: Legally Separated    Spouse name: Not on file  . Number of children: 1  . Years of education: Not on file  . Highest education level: Not on file  Occupational History  . Not on file  Social Needs  . Financial resource strain: Not on file  . Food insecurity:    Worry: Not on file    Inability: Not on file  . Transportation needs:    Medical: Not on file    Non-medical: Not on file  Tobacco Use  . Smoking status: Current Some Day Smoker    Packs/day: 0.75    Years: 44.00    Pack years: 33.00    Types: Cigarettes  . Smokeless tobacco: Never Used  Substance and Sexual Activity  . Alcohol use: Yes    Comment: occasional  . Drug use: No  . Sexual activity: Not on file  Lifestyle  . Physical activity:    Days per week: Not on file    Minutes per session: Not on file  . Stress: Not on file  Relationships  . Social connections:    Talks on phone: Not on file    Gets together: Not on file    Attends religious service: Not on file    Active member of club or organization: Not on file    Attends meetings of clubs or organizations: Not on file    Relationship status: Not on file  . Intimate partner violence:    Fear of current or ex partner: Not on file    Emotionally abused: Not on file    Physically abused: Not on file    Forced sexual activity: Not on file  Other Topics Concern  . Not on file  Social History Narrative  . Not on file    Outpatient Medications Prior to Visit   Medication Sig Dispense Refill  . acetaminophen (TYLENOL) 500 MG tablet Take 500 mg by mouth every 6 (six) hours as needed.    Marland Kitchen albuterol (PROVENTIL HFA;VENTOLIN HFA) 108 (90 Base) MCG/ACT inhaler Inhale 2 puffs into the lungs every 4 (four) hours as needed for wheezing or shortness of breath (cough, shortness of breath or wheezing.). 1 Inhaler 11  . albuterol (PROVENTIL) (2.5 MG/3ML) 0.083% nebulizer solution Take 3 mLs (2.5 mg total) by nebulization every 6 (six) hours as needed for wheezing or shortness of breath. 150 mL 2  . alfuzosin (UROXATRAL) 10 MG 24 hr tablet     . amLODipine (NORVASC) 10 MG tablet Take 1 tablet (10 mg total) by mouth daily. 90 tablet 1  . aspirin EC 81 MG tablet Take 1 tablet (81 mg total) by mouth daily.    Marland Kitchen azithromycin (ZITHROMAX) 250 MG tablet Take 2 tablets on day 1, one tablet daily 6 tablet 0  . cetirizine (ZYRTEC) 10 MG tablet TAKE 1 TABLET (10 MG TOTAL) BY MOUTH DAILY. 90 tablet 4  . Cholecalciferol (VITAMIN D3) 5000 units CAPS Take 1 capsule (5,000 Units total) by mouth daily. 180 capsule 0  . cilostazol (PLETAL) 50 MG tablet Take 1 tablet (50 mg total) by mouth 2 (two) times daily.    Marland Kitchen diltiazem (DILACOR XR) 240 MG 24 hr capsule Take 1 capsule (240 mg total) by mouth daily. 90 capsule 1  . ipratropium (ATROVENT) 0.03 % nasal spray Place 2 sprays into the nose 4 (four) times daily. 30 mL 1  . mometasone-formoterol (DULERA) 200-5 MCG/ACT AERO Inhale 2 puffs into the lungs 2 (two) times daily. 1 Inhaler 11  . montelukast (SINGULAIR) 10 MG tablet Take 1 tablet (10 mg total) by mouth at bedtime. 90 tablet 3  . olmesartan (BENICAR) 40 MG tablet Take 1 tablet (40 mg total) by mouth daily. 90 tablet 1   No facility-administered medications prior to visit.     Allergies  Allergen Reactions  . Lisinopril Cough  . Lisinopril-Hydrochlorothiazide     dizziness    ROS Review of Systems    Objective:    Physical Exam  BP (!) 154/78   Pulse 76   Temp  98.2 F (36.8 C) (Oral)   Resp 20   Ht 6' 2.21" (1.885 m)   Wt 201 lb (91.2 kg)   SpO2 97%   BMI 25.66 kg/m  Wt Readings from Last 3 Encounters:  01/29/19 201 lb (91.2 kg)  12/24/18 203 lb 9.6 oz (92.4 kg)  09/16/18 203 lb (  92.1 kg)   Physical Exam  Constitutional: Oriented to person, place, and time. Appears well-developed and well-nourished.  HENT:  Head: Normocephalic and atraumatic.  Eyes: Conjunctivae and EOM are normal.  Cardiovascular: Normal rate, regular rhythm, normal heart sounds and intact distal pulses.  No murmur heard. Pulmonary/Chest: Effort normal and breath sounds normal. No stridor. No respiratory distress. Has no wheezes.  Neurological: Is alert and oriented to person, place, and time.  Skin: Skin is warm. Capillary refill takes less than 2 seconds.  Psychiatric: Has a normal mood and affect. Behavior is normal. Judgment and thought content normal.  Back: tenderness over the SI joint on the left   Health Maintenance Due  Topic Date Due  . TETANUS/TDAP  02/05/1970  . MAMMOGRAM  02/05/2001  . Fecal DNA (Cologuard)  02/05/2001  . DEXA SCAN  02/06/2016  . INFLUENZA VACCINE  06/12/2018  . PNA vac Low Risk Adult (2 of 2 - PPSV23) 09/02/2018    There are no preventive care reminders to display for this patient.  No results found for: TSH Lab Results  Component Value Date   WBC 6.3 07/08/2018   HGB 14.2 07/08/2018   HCT 41.8 07/08/2018   MCV 85.3 07/08/2018   PLT 268.0 07/08/2018   Lab Results  Component Value Date   NA 140 09/16/2018   K 4.3 09/16/2018   CO2 21 09/16/2018   GLUCOSE 92 09/16/2018   BUN 16 09/16/2018   CREATININE 1.47 (H) 09/16/2018   BILITOT <0.2 09/16/2018   ALKPHOS 123 (H) 09/16/2018   AST 16 09/16/2018   ALT 14 09/16/2018   PROT 6.6 09/16/2018   ALBUMIN 4.1 09/16/2018   CALCIUM 9.1 09/16/2018   ANIONGAP 10 07/21/2017   Lab Results  Component Value Date   CHOL 236 (H) 06/29/2017   Lab Results  Component Value Date    HDL 57 06/29/2017   Lab Results  Component Value Date   LDLCALC 162 (H) 06/29/2017   Lab Results  Component Value Date   TRIG 85 06/29/2017   Lab Results  Component Value Date   CHOLHDL 4.1 06/29/2017   Lab Results  Component Value Date   HGBA1C 5.7 (H) 09/16/2018      Assessment & Plan:   Problem List Items Addressed This Visit      Cardiovascular and Mediastinum   Essential hypertension -  Previously on lisinopril-hctz  And had dizziness Will try hctz alone to help bring down the bp  Continue diltiazem and olmesartan   Relevant Medications   hydrochlorothiazide (HYDRODIURIL) 12.5 MG tablet    Other Visit Diagnoses    Sacroiliac joint pain    -  Primary Advised pt to see Chiropractor   Relevant Orders   DG Chest 2 View   Cough    - stable, will check cxr      Meds ordered this encounter  Medications  . hydrochlorothiazide (HYDRODIURIL) 12.5 MG tablet    Sig: Take 1 tablet (12.5 mg total) by mouth daily.    Dispense:  90 tablet    Refill:  1    Follow-up: No follow-ups on file.    Forrest Moron, MD

## 2019-01-29 NOTE — Patient Instructions (Addendum)
Biofreeze to the affected area on the left side    If you have lab work done today you will be contacted with your lab results within the next 2 weeks.  If you have not heard from [place] then please contact us. The fastest way to get your results is to register for My Chart.   IF you received an x-ray today, you will receive an invoice from [facility]. Please contact [facility] at [phone] with questions or concerns regarding your invoice.   IF you received labwork today, you will receive an invoice from [facility]. Please contact [facility] at [phone] with questions or concerns regarding your invoice.   Our billing staff will not be able to assist you with questions regarding bills from these companies.  You will be contacted with the lab results as soon as they are available. The fastest way to get your results is to activate your My Chart account. Instructions are located on the last page of this paperwork. If you have not heard from [place] regarding the results in 2 weeks, please contact this office.     Sacroiliac Joint Dysfunction  Sacroiliac joint dysfunction is a condition that causes inflammation on one or both sides of the sacroiliac (SI) joint. The SI joint connects the lower part of the spine (sacrum) with the two upper portions of the pelvis (ilium). This condition causes deep aching or burning pain in the low back. In some cases, the pain may also spread into one or both buttocks, hips, or thighs. What are the causes? This condition may be caused by:  Pregnancy. During pregnancy, extra stress is put on the SI joints because the pelvis widens.  Injury, such as: ? Injuries from car accidents. ? Sports-related injuries. ? Work-related injuries.  Having one leg that is shorter than the other.  Conditions that affect the joints, such as: ? Rheumatoid arthritis. ? Gout. ? Psoriatic arthritis. ? Joint infection (septic arthritis). Sometimes, the cause of  SI joint dysfunction is not known. What are the signs or symptoms? Symptoms of this condition include:  Aching or burning pain in the lower back. The pain may also spread to other areas, such as: ? Buttocks. ? Groin. ? Thighs.  Muscle spasms in or around the painful areas.  Increased pain when standing, walking, running, stair climbing, bending, or lifting. How is this diagnosed? This condition is diagnosed with a physical exam and medical history. During the exam, the health care provider may move one or both of your legs to different positions to check for pain. Various tests may be done to confirm the diagnosis, including:  Imaging tests to look for other causes of pain. These may include: ? MRI. ? CT scan. ? Bone scan.  Diagnostic injection. A numbing medicine is injected into the SI joint using a needle. If your pain is temporarily improved or stopped after the injection, this can indicate that SI joint dysfunction is the problem. How is this treated? Treatment depends on the cause and severity of your condition. Treatment options may include:  Ice or heat applied to the lower back area after an injury. This may help reduce pain and muscle spasms.  Medicines to relieve pain or inflammation or to relax the muscles.  Wearing a back brace (sacroiliac brace) to help support the joint while your back is healing.  Physical therapy to increase muscle strength around the joint and flexibility at the joint. This may also involve learning proper body positions and ways of  moving to relieve stress on the joint.  Direct manipulation of the SI joint.  Injections of steroid medicine into the joint to reduce pain and swelling.  Radiofrequency ablation to burn away nerves that are carrying pain messages from the joint.  Use of a device that provides electrical stimulation to help reduce pain at the joint.  Surgery to put in screws and plates that limit or prevent joint motion. This is  rare. Follow these instructions at home: Medicines  Take over-the-counter and prescription medicines only as told by your health care provider.  Do not drive or use heavy machinery while taking prescription pain medicine.  If you are taking prescription pain medicine, take actions to prevent or treat constipation. Your health care provider may recommend that you: ? Drink enough fluid to keep your urine pale yellow. ? Eat foods that are high in fiber, such as fresh fruits and vegetables, whole grains, and beans. ? Limit foods that are high in fat and processed sugars, such as fried or sweet foods. ? Take an over-the-counter or prescription medicine for constipation. If you have a brace:  Wear the brace as told by your health care provider. Remove it only as told by your health care provider.  Keep the brace clean.  If the brace is not waterproof: ? Do not let it get wet. ? Cover it with a watertight covering when you take a bath or a shower. Managing pain, stiffness, and swelling      Icing can help with pain and swelling. Heat may help with muscle tension or spasms. Ask your health care provider if you should use ice or heat.  If directed, put ice on the affected area: ? If you have a removable brace, remove it as told by your health care provider. ? Put ice in a plastic bag. ? Place a towel between your skin and the bag. ? Leave the ice on for 20 minutes, 2-3 times a day.  If directed, apply heat to the affected area. Use the heat source that your health care provider recommends, such as a moist heat pack or a heating pad. ? Place a towel between your skin and the heat source. ? Leave the heat on for 20-30 minutes. ? Remove the heat if your skin turns bright red. This is especially important if you are unable to feel pain, heat, or cold. You may have a greater risk of getting burned. General instructions  Rest as needed. Ask your health care provider what activities are safe  for you.  Return to your normal activities as told by your health care provider.  Exercise as directed by your health care provider or physical therapist.  Do not use any products that contain nicotine or tobacco, such as cigarettes and e-cigarettes. These can delay bone healing. If you need help quitting, ask your health care provider.  Keep all follow-up visits as told by your health care provider. This is important. Contact a health care provider if:  Your pain is not controlled with medicine.  You have a fever.  Your pain is getting worse. Get help right away if:  You have weakness, numbness, or tingling in your legs or feet.  You lose control of your bladder or bowel. Summary  Sacroiliac joint dysfunction is a condition that causes inflammation on one or both sides of the sacroiliac (SI) joint.  This condition causes deep aching or burning pain in the low back. In some cases, the pain may also spread  into one or both buttocks, hips, or thighs.  Treatment depends on the cause and severity of your condition. It may include medicines to reduce pain and swelling or to relax muscles. This information is not intended to replace advice given to you by your health care provider. Make sure you discuss any questions you have with your health care provider. Document Released: 01/25/2009 Document Revised: 12/09/2017 Document Reviewed: 12/09/2017 Elsevier Interactive Patient Education  2019 [employer].

## 2019-03-24 ENCOUNTER — Other Ambulatory Visit: Payer: Self-pay

## 2019-03-24 DIAGNOSIS — E663 Overweight: Secondary | ICD-10-CM

## 2019-03-24 DIAGNOSIS — I10 Essential (primary) hypertension: Secondary | ICD-10-CM

## 2019-03-24 DIAGNOSIS — E785 Hyperlipidemia, unspecified: Secondary | ICD-10-CM

## 2019-03-24 DIAGNOSIS — R7303 Prediabetes: Secondary | ICD-10-CM

## 2019-03-31 ENCOUNTER — Ambulatory Visit (INDEPENDENT_AMBULATORY_CARE_PROVIDER_SITE_OTHER): Payer: Medicare HMO | Admitting: Family Medicine

## 2019-03-31 ENCOUNTER — Other Ambulatory Visit: Payer: Self-pay

## 2019-03-31 ENCOUNTER — Encounter: Payer: Self-pay | Admitting: Family Medicine

## 2019-03-31 VITALS — BP 130/66 | HR 78 | Temp 98.0°F | Resp 16 | Ht 74.21 in | Wt 195.8 lb

## 2019-03-31 DIAGNOSIS — E785 Hyperlipidemia, unspecified: Secondary | ICD-10-CM

## 2019-03-31 DIAGNOSIS — R7303 Prediabetes: Secondary | ICD-10-CM | POA: Diagnosis not present

## 2019-03-31 DIAGNOSIS — I10 Essential (primary) hypertension: Secondary | ICD-10-CM | POA: Diagnosis not present

## 2019-03-31 NOTE — Progress Notes (Signed)
Established Patient Office Visit  Subjective:  Patient ID: Andrew [name], adult    DOB: [birth year]  Age: 68 y.o. MRN: [id number]  CC:  Chief Complaint  Patient presents with  . blood pressure  2 month recheck    HPI Andrew [name] presents for   Hypertension: Patient here for follow-up of elevated blood pressure. She is exercising by doing yard work and is adherent to low salt diet.  Blood pressure is well controlled at home. Cardiac symptoms none. Patient denies chest pain, chest pressure/discomfort, claudication, dyspnea, exertional chest pressure/discomfort, fatigue and irregular heart beat.  Cardiovascular risk factors: hypertension. Use of agents associated with hypertension: none. History of target organ damage: none . He reports that rarely he will get some dizziness especially if he picks up his head too quick.  BP Readings from Last 3 Encounters:  03/31/19 130/66  01/29/19 (!) 154/78  12/24/18 (!) 160/70   Prediabetes Patient is not exercising as much as when he works but he is eating healthy He denies polyuria  Wt Readings from Last 3 Encounters:  03/31/19 195 lb 12.8 oz (88.8 kg)  01/29/19 201 lb (91.2 kg)  12/24/18 203 lb 9.6 oz (92.4 kg)     Past Medical History:  Diagnosis Date  . Asthma   . Carotid artery stenosis, asymptomatic, bilateral 12/06/2017   Consider recheck Dopplers in 6 months -> 05/2018 Mild to moderate bilateral carotid artery stenosis Carotid Dopplers done at [facility] cardiovascular 11/26/2017 show left internal carotid artery stenosis 50-69% with soft plaque.  Mild stenosis in the left common carotid artery <50%. Mild stenosis in the right common carotid artery <50% with moderate diffuse soft plaque.  Mild stenosis in the right e  . COCAINE ABUSE 01/30/2008   Qualifier: Diagnosis of  By: Amil Amen MD, Benjamine Mola    . COPD (chronic obstructive pulmonary disease) ([place])   . Hypertension     No past surgical history on file.  No family history  on file.  Social History   Socioeconomic History  . Marital status: Legally Separated    Spouse name: Not on file  . Number of children: 1  . Years of education: Not on file  . Highest education level: Not on file  Occupational History  . Not on file  Social Needs  . Financial resource strain: Not on file  . Food insecurity:    Worry: Not on file    Inability: Not on file  . Transportation needs:    Medical: Not on file    Non-medical: Not on file  Tobacco Use  . Smoking status: Current Some Day Smoker    Packs/day: 0.75    Years: 44.00    Pack years: 33.00    Types: Cigarettes  . Smokeless tobacco: Never Used  Substance and Sexual Activity  . Alcohol use: Yes    Comment: occasional  . Drug use: No  . Sexual activity: Not on file  Lifestyle  . Physical activity:    Days per week: Not on file    Minutes per session: Not on file  . Stress: Not on file  Relationships  . Social connections:    Talks on phone: Not on file    Gets together: Not on file    Attends religious service: Not on file    Active member of club or organization: Not on file    Attends meetings of clubs or organizations: Not on file    Relationship status: Not on file  .  Intimate partner violence:    Fear of current or ex partner: Not on file    Emotionally abused: Not on file    Physically abused: Not on file    Forced sexual activity: Not on file  Other Topics Concern  . Not on file  Social History Narrative  . Not on file    Outpatient Medications Prior to Visit  Medication Sig Dispense Refill  . acetaminophen (TYLENOL) 500 MG tablet Take 500 mg by mouth every 6 (six) hours as needed.    Marland Kitchen albuterol (PROVENTIL HFA;VENTOLIN HFA) 108 (90 Base) MCG/ACT inhaler Inhale 2 puffs into the lungs every 4 (four) hours as needed for wheezing or shortness of breath (cough, shortness of breath or wheezing.). 1 Inhaler 11  . albuterol (PROVENTIL) (2.5 MG/3ML) 0.083% nebulizer solution Take 3 mLs (2.5 mg  total) by nebulization every 6 (six) hours as needed for wheezing or shortness of breath. 150 mL 2  . alfuzosin (UROXATRAL) 10 MG 24 hr tablet     . aspirin EC 81 MG tablet Take 1 tablet (81 mg total) by mouth daily.    Marland Kitchen azithromycin (ZITHROMAX) 250 MG tablet Take 2 tablets on day 1, one tablet daily 6 tablet 0  . cetirizine (ZYRTEC) 10 MG tablet TAKE 1 TABLET (10 MG TOTAL) BY MOUTH DAILY. 90 tablet 4  . Cholecalciferol (VITAMIN D3) 5000 units CAPS Take 1 capsule (5,000 Units total) by mouth daily. 180 capsule 0  . cilostazol (PLETAL) 50 MG tablet Take 1 tablet (50 mg total) by mouth 2 (two) times daily.    Marland Kitchen diltiazem (DILACOR XR) 240 MG 24 hr capsule Take 1 capsule (240 mg total) by mouth daily. 90 capsule 1  . hydrochlorothiazide (HYDRODIURIL) 12.5 MG tablet Take 1 tablet (12.5 mg total) by mouth daily. 90 tablet 1  . ipratropium (ATROVENT) 0.03 % nasal spray Place 2 sprays into the nose 4 (four) times daily. 30 mL 1  . mometasone-formoterol (DULERA) 200-5 MCG/ACT AERO Inhale 2 puffs into the lungs 2 (two) times daily. 1 Inhaler 11  . montelukast (SINGULAIR) 10 MG tablet Take 1 tablet (10 mg total) by mouth at bedtime. 90 tablet 3  . olmesartan (BENICAR) 40 MG tablet Take 1 tablet (40 mg total) by mouth daily. 90 tablet 1   No facility-administered medications prior to visit.     Allergies  Allergen Reactions  . Lisinopril Cough  . Lisinopril-Hydrochlorothiazide     dizziness    ROS Review of Systems  Review of Systems  Constitutional: Negative for activity change, appetite change, chills and fever.  HENT: Negative for congestion, nosebleeds, trouble swallowing and voice change.   Respiratory: Negative for cough, shortness of breath and wheezing.   Gastrointestinal: Negative for diarrhea, nausea and vomiting.  Genitourinary: Negative for difficulty urinating, dysuria, flank pain and hematuria.  Musculoskeletal: Negative for back pain, joint swelling and neck pain.  Neurological:  Negative for dizziness, speech difficulty, light-headedness and numbness.  See HPI. All other review of systems negative.     Objective:    Physical Exam  BP 130/66 (BP Location: Left Arm, Patient Position: Sitting, Cuff Size: Normal)   Pulse 78   Temp 98 F (36.7 C) (Oral)   Resp 16   Ht 6' 2.21" (1.885 m)   Wt 195 lb 12.8 oz (88.8 kg)   SpO2 98%   BMI 25.00 kg/m  Wt Readings from Last 3 Encounters:  03/31/19 195 lb 12.8 oz (88.8 kg)  01/29/19 201 lb (91.2 kg)  12/24/18 203 lb 9.6 oz (92.4 kg)   Physical Exam  Constitutional: Oriented to person, place, and time. Appears well-developed and well-nourished.  HENT:  Head: Normocephalic and atraumatic.  Eyes: Conjunctivae and EOM are normal.  Cardiovascular: Normal rate, regular rhythm, normal heart sounds and intact distal pulses.  No murmur heard. Pulmonary/Chest: Effort normal and breath sounds normal. No stridor. No respiratory distress. Has no wheezes.  Neurological: Is alert and oriented to person, place, and time.  Skin: Skin is warm. Capillary refill takes less than 2 seconds.  Psychiatric: Has a normal mood and affect. Behavior is normal. Judgment and thought content normal.     Health Maintenance Due  Topic Date Due  . TETANUS/TDAP  02/05/1970  . MAMMOGRAM  02/05/2001  . Fecal DNA (Cologuard)  02/05/2001  . DEXA SCAN  02/06/2016  . PNA vac Low Risk Adult (2 of 2 - PPSV23) 09/02/2018    There are no preventive care reminders to display for this patient.  No results found for: TSH Lab Results  Component Value Date   WBC 6.3 07/08/2018   HGB 14.2 07/08/2018   HCT 41.8 07/08/2018   MCV 85.3 07/08/2018   PLT 268.0 07/08/2018   Lab Results  Component Value Date   NA 140 09/16/2018   K 4.3 09/16/2018   CO2 21 09/16/2018   GLUCOSE 92 09/16/2018   BUN 16 09/16/2018   CREATININE 1.47 (H) 09/16/2018   BILITOT <0.2 09/16/2018   ALKPHOS 123 (H) 09/16/2018   AST 16 09/16/2018   ALT 14 09/16/2018   PROT 6.6  09/16/2018   ALBUMIN 4.1 09/16/2018   CALCIUM 9.1 09/16/2018   ANIONGAP 10 07/21/2017   Lab Results  Component Value Date   CHOL 236 (H) 06/29/2017   Lab Results  Component Value Date   HDL 57 06/29/2017   Lab Results  Component Value Date   LDLCALC 162 (H) 06/29/2017   Lab Results  Component Value Date   TRIG 85 06/29/2017   Lab Results  Component Value Date   CHOLHDL 4.1 06/29/2017   Lab Results  Component Value Date   HGBA1C 5.7 (H) 09/16/2018      Assessment & Plan:   Problem List Items Addressed This Visit      Cardiovascular and Mediastinum   Essential hypertension - Primary  -  Patient's blood pressure is at goal of 139/89 or less. Condition is stable. Continue current medications and treatment plan. I recommend that you exercise for 30-45 minutes 5 days a week. I also recommend a balanced diet with fruits and vegetables every day, lean meats, and little fried foods. The DASH diet (you can find this online) is a good example of this.      Other   Hyperlipidemia LDL goal <70 -  Will discussed    Prediabetes  - discussed glycemic index, plant based protein      No orders of the defined types were placed in this encounter.   Follow-up: No follow-ups on file.    Forrest Moron, MD

## 2019-03-31 NOTE — Patient Instructions (Addendum)
From Diabetes.org . Last Reviewed: July 05, 2016  . Last Edited: August 27, 2016   Protein Foods Foods high in protein such as fish, chicken, meats, soy products, and cheese, are all called "protein foods." You may also hear them referred to as 'meats or meat substitutes." The biggest difference among foods in this group is how much fat they contain, and for the vegetarian proteins, whether they have carbohydrate.  Protein Choices Plant-Based Proteins Plant-based protein foods provide quality protein, healthy fats, and fiber. They vary in how much fat and carbohydrate they contain, so make sure to read labels. . Beans such as black, kidney, and pinto  . Bean products like baked beans and refried beans  . Hummus and falafel  . Lentils such as brown, green, or yellow  . Peas such as black-eyed or split peas  . Edamame  . Soy nuts  . Nuts and spreads like almond butter, cashew butter, or peanut butter  . Tempeh, tofu  . Products like meatless "chicken" nuggets, "beef" crumbles, "burgers", "bacon", "sausage", and "hot dogs" Fish and Seafood Try to include fish at least 2 times per week. . Fish high in omega-3 fatty acids like [place] tuna, herring, mackerel, rainbow trout, sardines, and salmon  . Other fish including catfish, cod, flounder, haddock, halibut, orange roughy, and tilapia  . Shellfish including clams, crab, imitation shellfish, lobster, scallops, shrimp, oysters. Agricultural consultant without the skin for less saturated fat and cholesterol. . Chicken, [place], cornish hen .  Cheese and Eggs . Reduced-fat cheese or regular cheese in small amounts  . Cottage cheese  . Whole eggs Game . Buffalo, ostrich, rabbit, venison  . Dove, duck, goose, or pheasant (no skin) Beef, Pork, Veal, Lamb It's best to limit your intake of red meat which is often higher in saturated fat and processed meats like ham, bacon and hot dogs which are often higher in saturated fat and sodium. If  you decide to have these, choose the leanest options, which are: . Select or Choice grades of beef trimmed of fat including: chuck, rib, rump roast, round, sirloin, cubed, flank, porterhouse, T-bone steak, tenderloin  . Lamb: chop, leg, or roast  . Veal: loin chop or roast  . Pork: Canadian bacon, center loin chop, ham, tenderloin

## 2019-04-01 LAB — COMPREHENSIVE METABOLIC PANEL
ALT: 12 IU/L (ref 0–44)
AST: 15 IU/L (ref 0–40)
Albumin/Globulin Ratio: 1.9 (ref 1.2–2.2)
Albumin: 4.9 g/dL — ABNORMAL HIGH (ref 3.8–4.8)
Alkaline Phosphatase: 141 IU/L — ABNORMAL HIGH (ref 39–117)
BUN/Creatinine Ratio: 8 — ABNORMAL LOW (ref 10–24)
BUN: 14 mg/dL (ref 8–27)
Bilirubin Total: 0.3 mg/dL (ref 0.0–1.2)
CO2: 23 mmol/L (ref 20–29)
Calcium: 10.3 mg/dL — ABNORMAL HIGH (ref 8.6–10.2)
Chloride: 100 mmol/L (ref 96–106)
Creatinine, Ser: 1.79 mg/dL — ABNORMAL HIGH (ref 0.76–1.27)
GFR calc Af Amer: 44 mL/min/{1.73_m2} — ABNORMAL LOW (ref >59)
GFR calc non Af Amer: 38 mL/min/{1.73_m2} — ABNORMAL LOW (ref >59)
Globulin, Total: 2.6 g/dL (ref 1.5–4.5)
Glucose: 94 mg/dL (ref 65–99)
Potassium: 4.4 mmol/L (ref 3.5–5.2)
Sodium: 138 mmol/L (ref 134–144)
Total Protein: 7.5 g/dL (ref 6.0–8.5)

## 2019-04-01 LAB — HEMOGLOBIN A1C
Est. average glucose Bld gHb Est-mCnc: 120 mg/dL
Hgb A1c MFr Bld: 5.8 % — ABNORMAL HIGH (ref 4.8–5.6)

## 2019-04-01 LAB — LIPID PANEL
Chol/HDL Ratio: 4.1 ratio (ref 0.0–5.0)
Cholesterol, Total: 248 mg/dL — ABNORMAL HIGH (ref 100–199)
HDL: 60 mg/dL (ref >39)
LDL Calculated: 169 mg/dL — ABNORMAL HIGH (ref 0–99)
Triglycerides: 96 mg/dL (ref 0–149)
VLDL Cholesterol Cal: 19 mg/dL (ref 5–40)

## 2019-05-13 ENCOUNTER — Other Ambulatory Visit: Payer: Self-pay | Admitting: Family Medicine

## 2019-05-13 DIAGNOSIS — N1832 Chronic kidney disease, stage 3b: Secondary | ICD-10-CM

## 2019-05-13 DIAGNOSIS — E785 Hyperlipidemia, unspecified: Secondary | ICD-10-CM

## 2019-05-13 MED ORDER — PRAVASTATIN SODIUM 10 MG PO TABS: 10.0000 mg | ORAL_TABLET | Freq: Every day | ORAL | 0 refills | Status: DC

## 2019-05-16 ENCOUNTER — Other Ambulatory Visit: Payer: Self-pay | Admitting: Family Medicine

## 2019-05-16 DIAGNOSIS — I10 Essential (primary) hypertension: Secondary | ICD-10-CM

## 2019-05-17 NOTE — Telephone Encounter (Signed)
Requested Prescriptions  Pending Prescriptions Disp Refills  . hydrochlorothiazide (HYDRODIURIL) 12.5 MG tablet [Pharmacy Med Name: HYDROCHLOROTHIAZIDE 12.5 MG TB] 90 tablet 1    Sig: TAKE 1 TABLET BY MOUTH EVERY DAY     Cardiovascular: Diuretics - Thiazide Failed - 05/16/2019  8:17 PM      Failed - Ca in normal range and within 360 days    Calcium  Date Value Ref Range Status  03/31/2019 10.3 (H) 8.6 - 10.2 mg/dL Final         Failed - Cr in normal range and within 360 days    Creat  Date Value Ref Range Status  09/14/2016 1.33 (H) 0.70 - 1.25 mg/dL Final    Comment:      For patients > or = 69 years of age: The upper reference limit for Creatinine is approximately 13% higher for people identified as African-American.      Creatinine, Ser  Date Value Ref Range Status  03/31/2019 1.79 (H) 0.76 - 1.27 mg/dL Final         Passed - K in normal range and within 360 days    Potassium  Date Value Ref Range Status  03/31/2019 4.4 3.5 - 5.2 mmol/L Final         Passed - Na in normal range and within 360 days    Sodium  Date Value Ref Range Status  03/31/2019 138 134 - 144 mmol/L Final         Passed - Last BP in normal range    BP Readings from Last 1 Encounters:  03/31/19 130/66         Passed - Valid encounter within last 6 months    Recent Outpatient Visits          1 month ago Essential hypertension   [facility] at [facility], Arlie Solomons, MD   3 months ago Sacroiliac joint pain   [facility] at [place], MD   4 months ago Allergic rhinitis due to pollen, unspecified seasonality   [facility] at [place], MD   8 months ago Essential hypertension   [facility] at [facility], Laurey Arrow, MD   1 year ago Chronic kidney disease (CKD) stage G3a/A2, moderately decreased glomerular filtration rate (GFR) between 45-59 mL/min/1.73 square meter and albuminuria creatinine ratio between 30-299 mg/g [facility])   Primary [facility] at [place], MD       Future Appointments            In 1 month Melvyn Novas, Christena Deem, MD [facility]   In 4 months Forrest Moron, MD Primary Care at [place], [facility]

## 2019-05-21 DIAGNOSIS — C61 Malignant neoplasm of prostate: Secondary | ICD-10-CM | POA: Diagnosis not present

## 2019-05-29 DIAGNOSIS — C61 Malignant neoplasm of prostate: Secondary | ICD-10-CM | POA: Diagnosis not present

## 2019-06-08 ENCOUNTER — Encounter: Payer: Self-pay | Admitting: Family Medicine

## 2019-06-16 ENCOUNTER — Encounter: Payer: Self-pay | Admitting: Internal Medicine

## 2019-06-16 ENCOUNTER — Other Ambulatory Visit: Payer: Self-pay

## 2019-06-16 ENCOUNTER — Ambulatory Visit (INDEPENDENT_AMBULATORY_CARE_PROVIDER_SITE_OTHER): Payer: Medicare HMO | Admitting: Internal Medicine

## 2019-06-16 DIAGNOSIS — J449 Chronic obstructive pulmonary disease, unspecified: Secondary | ICD-10-CM | POA: Diagnosis not present

## 2019-06-16 DIAGNOSIS — J3081 Allergic rhinitis due to animal (cat) (dog) hair and dander: Secondary | ICD-10-CM

## 2019-06-16 DIAGNOSIS — F17210 Nicotine dependence, cigarettes, uncomplicated: Secondary | ICD-10-CM

## 2019-06-16 NOTE — Patient Instructions (Addendum)
Low-dose CT lung cancer screening is recommended for patients who are 64-68 years of age with a 30+ pack-year history of smoking, and who are currently smoking or quit <=15 years ago.  .The key is to stop smoking completely before smoking completely stops you!     Work on inhaler technique:  relax and gently blow all the way out then take a nice smooth deep breath back in, triggering the inhaler at same time you start breathing in.  Hold for up to 5 seconds if you can. Blow out thru nose. Rinse and gargle with water when done.  Our office will contact you to set up the program with a visit for shared decision making first    Please schedule a follow up visit in 12 months but call sooner if needed

## 2019-06-16 NOTE — Progress Notes (Signed)
Subjective:     Patient ID: Andrew [name], male   DOB: Dec 20, 1950,    MRN: [id number]     Brief patient profile:  29 yobm active smoker referred to pulmonary clinic 09/20/2017 by Dr   Ranee Gosselin for copd eval with GOLD II criteria documented 12/09/17    History of Present Illness  09/20/2017 1st [facility] office visit/ Andrew [name]   Chief Complaint  Patient presents with  . Pulmonary Consult    Referred by [name] for eval of COPD. Pt states his breathing is currently at baseline. He uses an albuterol inhaler 2-3 x per wk and has atrovent and albuterol nebs that he rarely uses.    exac 07/21/17 did not req admit with rec stating on Breo 200 denies taking too expensive so started symb ? Strength p er rx but costs 300 per month  Baseline doe now on rx = best days Not limited by breathing from desired activities lots of fast pace walking at [employer]/ some hills ok  rec Plan A = Automatic = symbiocort 160=dulera 200 Take 2 puffs first thing in am and then another 2 puffs about 12 hours later.  Work on inhaler technique:  relax and gently blow all the way out then take a nice smooth deep breath back in, triggering the inhaler at same time you start breathing in.  Hold for up to 5 seconds if you can. Blow out thru nose. Rinse and gargle with water when done Plan B = Backup Only use your albuterol as a rescue medication Plan C = Crisis - only use your albuterol nebulizer if you first try Plan B     12/09/2017  f/u ov/Andrew [name] re: COPD actually gold II with reversibility/ still smoking  Chief Complaint  Patient presents with  . Follow-up    follow up for COPD.   Dyspnea:   Improved:  MMRC1 = can walk nl pace, flat grade, can't hurry or go uphills or steps s sob   Cough: no Sleep: no No need for albuterol  rec No change in medications  Symbicort 160 = dulera 200 Take 2 puffs first thing in am and then another 2 puffs about 12 hours later.  The key is to stop smoking  completely before smoking completely stops you - it's not too late!    07/08/2018  f/u ov/Andrew [name] re:  COPD  GOLD II with reversiblity  Chief Complaint  Patient presents with  . Follow-up    6 month follow up. Patient states he does still have a cough. Productive cough with clear mucus. Also has some post nasal drip. States he will develop a runny nose.   Dyspnea:  Not limited by breathing from desired activities   Cough: mostly in am / clear mucus x 4y s change assoc with pnds sensation no better with zyrtec or atrovent  Sleeping: 10 degrees on 4 pillows  SABA use: once a week 02: none  rec Please remember to go to the lab department downstairs in the basement  for your tests - we    Keep your appt for the lung cancer screening planned > did not do See if dulera is more affordable otherwise continue symb 160    06/16/2019  f/u ov/Andrew Ranum re: GOLD II with reversibility / still smoking / maint on symb 160 2bid Chief Complaint  Patient presents with  . Follow-up    F/U for COPD, pt reports usual DOE & chronic cough w/ clear mucus and  runny nose, reports taking Mucinex once daily; reports taking Symbicort daily (unsure of strength)  Dyspnea:  MMRC1 = can walk nl pace, flat grade, can't hurry or go uphills or steps s sob   Cough: mostly in am x 5 y s change/ better on mucinex  Sleeping: on back with 4 pillows SABA use: none 02: none    No obvious day to day or daytime variability or assoc excess/ purulent sputum or mucus plugs or hemoptysis or cp or chest tightness, subjective wheeze or overt sinus or hb symptoms.   Sleeping  without nocturnal  or early am exacerbation  of respiratory  c/o's or need for noct saba. Also denies any obvious fluctuation of symptoms with weather or environmental changes or other aggravating or alleviating factors except as outlined above   No unusual exposure hx or h/o childhood pna/ asthma or knowledge of premature birth.  Current Allergies, Complete Past  Medical History, Past Surgical History, Family History, and Social History were reviewed in [employer] record.  ROS  The following are not active complaints unless bolded Hoarseness, sore throat, dysphagia, dental problems, itching, sneezing around dogs,  nasal congestion or discharge of excess mucus or purulent secretions, ear ache,   fever, chills, sweats, unintended wt loss or wt gain, classically pleuritic or exertional cp,  orthopnea pnd or arm/hand swelling  or leg swelling, presyncope, palpitations, abdominal pain, anorexia, nausea, vomiting, diarrhea  or change in bowel habits or change in bladder habits, change in stools or change in urine, dysuria, hematuria,  rash, arthralgias, visual complaints, headache, numbness, weakness or ataxia or problems with walking or coordination,  change in mood or  memory.        Current Meds  Medication Sig  . acetaminophen (TYLENOL) 500 MG tablet Take 500 mg by mouth every 6 (six) hours as needed.  Marland Kitchen albuterol (PROVENTIL HFA;VENTOLIN HFA) 108 (90 Base) MCG/ACT inhaler Inhale 2 puffs into the lungs every 4 (four) hours as needed for wheezing or shortness of breath (cough, shortness of breath or wheezing.).  Marland Kitchen albuterol (PROVENTIL) (2.5 MG/3ML) 0.083% nebulizer solution Take 3 mLs (2.5 mg total) by nebulization every 6 (six) hours as needed for wheezing or shortness of breath.  . alfuzosin (UROXATRAL) 10 MG 24 hr tablet   . cetirizine (ZYRTEC) 10 MG tablet TAKE 1 TABLET (10 MG TOTAL) BY MOUTH DAILY.  . cilostazol (PLETAL) 50 MG tablet Take 1 tablet (50 mg total) by mouth 2 (two) times daily.  Marland Kitchen diltiazem (DILACOR XR) 240 MG 24 hr capsule Take 1 capsule (240 mg total) by mouth daily.  . hydrochlorothiazide (HYDRODIURIL) 12.5 MG tablet TAKE 1 TABLET BY MOUTH EVERY DAY  . ipratropium (ATROVENT) 0.03 % nasal spray Place 2 sprays into the nose 4 (four) times daily. (Patient taking differently: Place 2 sprays into the nose 4 (four) times  daily as needed. )  . montelukast (SINGULAIR) 10 MG tablet Take 1 tablet (10 mg total) by mouth at bedtime.  Marland Kitchen olmesartan (BENICAR) 40 MG tablet Take 1 tablet (40 mg total) by mouth daily.             Objective:   Physical Exam   amb bm nad   06/16/2019        194  07/08/2018       204  12/09/2017       207   09/20/17 202 lb (91.6 kg)  09/19/17 202 lb (91.6 kg)  09/03/17 201 lb 3.2 oz (91.3 kg)  Vital signs reviewed - Note on arrival 02 sats  97% on RA     HEENT: Upper plate/ lower partials/ nl oropharynx. Nl external ear canals without cough reflex -  Mild bilateral non-specific turbinate edema     NECK :  without JVD/Nodes/TM/ nl carotid upstrokes bilaterally   LUNGS: no acc muscle use,  Mild barrel  contour chest wall with bilateral  Distant bs s audible wheeze and  without cough on insp or exp maneuver and mild  Hyperresonant  to  percussion bilaterally     CV:  RRR  no s3 or murmur or increase in P2, and no edema   ABD:  soft and nontender with pos end  insp Hoover's  in the supine position. No bruits or organomegaly appreciated, bowel sounds nl  MS:   Nl gait/  ext warm without deformities, calf tenderness, cyanosis or clubbing No obvious joint restrictions   SKIN: warm and dry without lesions    NEURO:  alert, approp, nl sensorium with  no motor or cerebellar deficits apparent.         I personally reviewed images and agree with radiology impression as follows:  CXR:   01/29/19  No active cardiopulmonary disease.           Assessment:

## 2019-06-17 ENCOUNTER — Encounter: Payer: Self-pay | Admitting: Internal Medicine

## 2019-06-17 NOTE — Assessment & Plan Note (Signed)
Counseled re importance of smoking cessation but did not meet time criteria for separate billing    Referred for shared dec making for CT   I had an extended discussion with the patient reviewing all relevant studies completed to date and  lasting 15 to 20 minutes of a 25 minute visit    I performed detailed device teaching using a teach back method which extended face to face time for this visit (see above)  Each maintenance medication was reviewed in detail including emphasizing most importantly the difference between maintenance and prns and under what circumstances the prns are to be triggered using an action plan format that is not reflected in the computer generated alphabetically organized AVS which I have not found useful in most complex patients, especially with respiratory illnesses  Please see AVS for specific instructions unique to this visit that I personally wrote and verbalized to the the pt in detail and then reviewed with pt  by my nurse highlighting any  changes in therapy recommended at today's visit to their plan of care.

## 2019-06-17 NOTE — Assessment & Plan Note (Signed)
Allergy profile 07/08/2018 >  Eos 0.3 /  IgE  212 RAST pos Dogs, roaches, dust    Reviewed allergy panel, stressed avoidance, otc rx adequate

## 2019-06-17 NOTE — Assessment & Plan Note (Signed)
Active smoker  07/19/17 CT Mild centrilobular emphysematous changes, upper lobe predominant. - Spirometry 09/20/2017  FEV1 1.45 (42%)  Ratio 50 with typical curvature > dulera 200 (humana) 2bid   -  PFT's  12/09/2017  FEV1 2.38 (72 % ) ratio 60  p 1 % improvement from saba p symbicort 160 prior to study with DLCO  62/66 % corrects to 71  % for alv volume   - 12/09/2017   continue dulera 200 or symb 160 depending on insurance  06/16/2019  After extensive coaching inhaler device,  effectiveness =   90%   Despite active smoking doing ok on symbicort   I reviewed the Fletcher curve with the patient that basically indicates  if you quit smoking when your best day FEV1 is still well preserved (as is still probably  the case here)  it is highly unlikely you will progress to severe disease and informed the patient there was  no medication on the market that has proven to alter the curve/ its downward trajectory  or the likelihood of progression of their disease(unlike other chronic medical conditions such as atheroclerosis where we do think we can change the natural hx with risk reducing meds)    Therefore stopping smoking and maintaining abstinence are  the most important aspects of care, not choice of inhalers or for that matter, doctors.   Treatment other than smoking cessation  is entirely directed by severity of symptoms and focused also on reducing exacerbations, not attempting to change the natural history of the disease.    No change in rx needed  >>> f/u  q 12 months,  Refer for lung cancer screening decision making

## 2019-06-19 ENCOUNTER — Other Ambulatory Visit: Payer: Self-pay | Admitting: *Deleted

## 2019-06-19 DIAGNOSIS — Z87891 Personal history of nicotine dependence: Secondary | ICD-10-CM

## 2019-06-19 DIAGNOSIS — F17210 Nicotine dependence, cigarettes, uncomplicated: Secondary | ICD-10-CM

## 2019-06-19 DIAGNOSIS — Z122 Encounter for screening for malignant neoplasm of respiratory organs: Secondary | ICD-10-CM

## 2019-07-09 ENCOUNTER — Ambulatory Visit: Payer: Medicare HMO | Admitting: Internal Medicine

## 2019-07-10 ENCOUNTER — Ambulatory Visit: Payer: Medicare HMO | Admitting: Internal Medicine

## 2019-07-31 ENCOUNTER — Other Ambulatory Visit: Payer: Self-pay

## 2019-07-31 ENCOUNTER — Telehealth: Payer: Self-pay | Admitting: Family Medicine

## 2019-07-31 MED ORDER — OLMESARTAN MEDOXOMIL 40 MG PO TABS: 40.0000 mg | ORAL_TABLET | Freq: Every day | ORAL | 0 refills | Status: DC

## 2019-07-31 MED ORDER — DILTIAZEM HCL ER 240 MG PO CP24: 240.0000 mg | ORAL_CAPSULE | Freq: Every day | ORAL | 0 refills | Status: DC

## 2019-07-31 NOTE — Telephone Encounter (Signed)
Spoke with pt advised diltiazem 240 mg #90 w/0 refills and olmsartan 40 mg 90 w/ 0 refills approved and unable to refill triamcinolone cream as the rx was discontinued and dr Nolon Rod willl have write for this at his visit.   Pt advised to keep appt in November-pt agreeable.

## 2019-07-31 NOTE — Telephone Encounter (Signed)
Copied from [employer] [id number]. Topic: Quick Communication - Rx Refill/Question >> Jul 31, 2019  1:01 PM Carolyn Stare wrote: Medication    diltiazem (DILACOR XR) 240 MG 24 hr capsule   olmesartan (BENICAR) 40 MG tablet  Triamcinolone acetonide  pt req this cream cause he has issues in the fall with dryness and itching looks like med was discontinued     Preferred Pharmacy [facility] [place]   Agent: Please be advised that RX refills may take up to 3 business days. We ask that you follow-up with your pharmacy. >> Jul 31, 2019  1:06 PM Carolyn Stare wrote:  Pt req cream to go to  [facility] it is cheaper   Triamcinolone acetonide

## 2019-08-03 ENCOUNTER — Ambulatory Visit: Payer: Medicare HMO | Admitting: Family Medicine

## 2019-08-06 ENCOUNTER — Ambulatory Visit: Payer: Medicare HMO

## 2019-08-28 ENCOUNTER — Inpatient Hospital Stay: Admission: RE | Admit: 2019-08-28 | Payer: Medicare HMO | Source: Ambulatory Visit

## 2019-09-25 ENCOUNTER — Ambulatory Visit (INDEPENDENT_AMBULATORY_CARE_PROVIDER_SITE_OTHER)
Admission: RE | Admit: 2019-09-25 | Discharge: 2019-09-25 | Disposition: A | Discharge status: Home / Self Care | Payer: Medicare HMO | Source: Ambulatory Visit | Attending: Acute Care | Admitting: Acute Care

## 2019-09-25 ENCOUNTER — Other Ambulatory Visit: Payer: Self-pay

## 2019-09-25 DIAGNOSIS — Z87891 Personal history of nicotine dependence: Secondary | ICD-10-CM | POA: Diagnosis not present

## 2019-09-25 DIAGNOSIS — F17210 Nicotine dependence, cigarettes, uncomplicated: Secondary | ICD-10-CM

## 2019-09-25 DIAGNOSIS — Z122 Encounter for screening for malignant neoplasm of respiratory organs: Secondary | ICD-10-CM

## 2019-10-02 ENCOUNTER — Other Ambulatory Visit: Payer: Self-pay

## 2019-10-02 ENCOUNTER — Encounter: Payer: Self-pay | Admitting: Family Medicine

## 2019-10-02 ENCOUNTER — Ambulatory Visit (INDEPENDENT_AMBULATORY_CARE_PROVIDER_SITE_OTHER): Payer: Medicare HMO | Admitting: Family Medicine

## 2019-10-02 VITALS — BP 110/50 | HR 86 | Temp 98.3°F | Resp 16 | Ht 75.0 in | Wt 200.0 lb

## 2019-10-02 VITALS — BP 149/66 | HR 86 | Temp 98.3°F | Resp 16 | Ht 75.75 in | Wt 200.0 lb

## 2019-10-02 DIAGNOSIS — R7303 Prediabetes: Secondary | ICD-10-CM

## 2019-10-02 DIAGNOSIS — E785 Hyperlipidemia, unspecified: Secondary | ICD-10-CM

## 2019-10-02 DIAGNOSIS — Z0001 Encounter for general adult medical examination with abnormal findings: Secondary | ICD-10-CM | POA: Diagnosis not present

## 2019-10-02 DIAGNOSIS — D6869 Other thrombophilia: Secondary | ICD-10-CM

## 2019-10-02 DIAGNOSIS — I700 Atherosclerosis of aorta: Secondary | ICD-10-CM | POA: Diagnosis not present

## 2019-10-02 DIAGNOSIS — N1831 Chronic kidney disease, stage 3a: Secondary | ICD-10-CM | POA: Diagnosis not present

## 2019-10-02 DIAGNOSIS — Z0000 Encounter for general adult medical examination without abnormal findings: Secondary | ICD-10-CM

## 2019-10-02 DIAGNOSIS — I10 Essential (primary) hypertension: Secondary | ICD-10-CM

## 2019-10-02 DIAGNOSIS — I25810 Atherosclerosis of coronary artery bypass graft(s) without angina pectoris: Secondary | ICD-10-CM | POA: Diagnosis not present

## 2019-10-02 DIAGNOSIS — N1832 Chronic kidney disease, stage 3b: Secondary | ICD-10-CM | POA: Diagnosis not present

## 2019-10-02 MED ORDER — FLUTICASONE-SALMETEROL 250-50 MCG/DOSE IN AEPB: 1.0000 | INHALATION_SPRAY | Freq: Two times a day (BID) | RESPIRATORY_TRACT | 1 refills | Status: DC

## 2019-10-02 MED ORDER — VITAMIN D3 50 MCG (2000 UT) PO CAPS: 2000.0000 [IU] | ORAL_CAPSULE | Freq: Every day | ORAL | 11 refills | Status: DC

## 2019-10-02 MED ORDER — TRIAMCINOLONE ACETONIDE 0.1 % EX CREA: 1.0000 "application " | TOPICAL_CREAM | Freq: Two times a day (BID) | CUTANEOUS | 0 refills | Status: DC

## 2019-10-02 MED ORDER — ASPIRIN EC 81 MG PO TBEC: 81.0000 mg | DELAYED_RELEASE_TABLET | Freq: Every day | ORAL | 3 refills | Status: DC

## 2019-10-02 MED ORDER — ASPIRIN EC 81 MG PO TBEC: 81.0000 mg | DELAYED_RELEASE_TABLET | Freq: Every day | ORAL | Status: DC

## 2019-10-02 NOTE — Patient Instructions (Signed)
Atherosclerosis  Atherosclerosis is narrowing and hardening of the arteries. Arteries are blood vessels that carry blood from the heart to all parts of the body. This blood contains oxygen. Arteries can become narrow or clogged with a buildup of fat, cholesterol, calcium, and other substances (plaque). Plaque decreases the amount of blood that can flow through the artery. Atherosclerosis can affect any artery in the body, including:  Heart arteries (coronary artery disease). This may cause a heart attack.  Brain arteries. This may cause a stroke (cerebrovascular accident).  Leg, arm, and pelvis arteries (peripheral artery disease). This may cause pain and numbness.  Kidney arteries. This may cause kidney (renal) failure. Treatment may slow the disease and prevent further damage to the heart, brain, peripheral arteries, and kidneys. What are the causes? Atherosclerosis develops slowly over many years. The inner layers of your arteries become damaged and allow the gradual buildup of plaque. The exact cause of atherosclerosis is not fully understood. Symptoms of atherosclerosis do not occur until the artery becomes narrow or blocked. What increases the risk? The following factors may make you more likely to develop this condition:  High blood pressure.  High cholesterol.  Being middle-aged or older.  Having a family history of atherosclerosis.  Having high blood fats (triglycerides).  Diabetes.  Being overweight.  Smoking tobacco.  Not exercising enough (sedentary lifestyle).  Having a substance in the blood called C-reactive protein (CRP). This is a sign of increased levels of inflammation in the body.  Sleep apnea.  Being stressed.  Drinking too much alcohol. What are the signs or symptoms? This condition may not cause any symptoms. If you have symptoms, they are caused by damage to an area of your body that is not getting enough blood.  Coronary artery disease may cause  chest pain and shortness of breath.  Decreased blood supply to your brain may cause a stroke. Signs of a stroke may include sudden: ? Weakness on one side of the body. ? Confusion. ? Changes in vision. ? Inability to speak or understand speech. ? Loss of balance, coordination, or the ability to walk. ? Severe headache. ? Loss of consciousness.  Peripheral arterial disease may cause pain and numbness, often in the legs and hips.  Renal failure may cause fatigue, nausea, swelling, and itchy skin. How is this diagnosed? This condition is diagnosed based on your medical history and a physical exam. During the exam:  Your health care provider will: ? Check your pulse in different places. ? Listen for a "whooshing" sound over your arteries (bruit).  You may have tests, such as: ? Blood tests to check your levels of cholesterol, triglycerides, and CRP. ? Electrocardiogram (ECG) to check for heart damage. ? Chest X-ray to see if you have an enlarged heart, which is a sign of heart failure. ? Stress test to see how your heart reacts to exercise. ? Echocardiogram to get images of the inside of your heart. ? Ankle-brachial index to compare blood pressure in your arms to blood pressure in your ankles. ? Ultrasound of your peripheral arteries to check blood flow. ? CT scan to check for damage to your heart or brain. ? X-rays of blood vessels after dye has been injected (angiogram) to check blood flow. How is this treated? Treatment starts with lifestyle changes, which may include:  Changing your diet.  Losing weight.  Reducing stress.  Exercising and being physically active more regularly.  Not smoking. You may also need medicine to:  Lower   triglycerides and cholesterol.  Control blood pressure.  Prevent blood clots.  Lower inflammation in your body.  Control your blood sugar. Sometimes, surgery is needed to:  Remove plaque from an artery (endarterectomy).  Open or widen  a narrowed heart artery (angioplasty).  Create a new path for your blood with one of these procedures: ? Heart (coronary) artery bypass graft surgery. ? Peripheral artery bypass graft surgery. Follow these instructions at home: Eating and drinking   Eat a heart-healthy diet. Talk with your health care provider or a diet and nutrition specialist (dietitian) if you need help. A heart-healthy diet involves: ? Limiting unhealthy fats and increasing healthy fats. Some examples of healthy fats are olive oil and canola oil. ? Eating plant-based foods, such as fruits, vegetables, nuts, whole grains, and legumes (such as peas and lentils).  Limit alcohol intake to no more than 1 drink a day for nonpregnant women and 2 drinks a day for men. One drink equals 12 oz of beer, 5 oz of wine, or 1 oz of hard liquor. Lifestyle  Follow an exercise program as told by your health care provider.  Maintain a healthy weight. Lose weight if your health care provider says that you need to do that.  Rest when you are tired.  Learn to manage your stress.  Do not use any products that contain nicotine or tobacco, such as cigarettes and e-cigarettes. If you need help quitting, ask your health care provider.  Do not abuse drugs. General instructions  Take over-the-counter and prescription medicines only as told by your health care provider.  Manage other health conditions as told by your health care provider.  Keep all follow-up visits as told by your health care provider. This is important. Contact a health care provider if:  You have chest pain or discomfort. This includes squeezing chest pain that may feel like indigestion (angina).  You have shortness of breath.  You have an irregular heartbeat.  You have unexplained fatigue.  You have unexplained pain or numbness in an arm, leg, or hip.  You have nausea, swelling of your hands or feet, and itchy skin. Get help right away if:  You have any  symptoms of a heart attack, such as: ? Chest pain. ? Shortness of breath. ? Pain in your neck, jaw, arms, back, or stomach. ? Cold sweat. ? Nausea. ? Light-headedness.  You have any symptoms of a stroke. "BE FAST" is an easy way to remember the main warning signs of a stroke: ? B - Balance. Signs are dizziness, sudden trouble walking, or loss of balance. ? E - Eyes. Signs are trouble seeing or a sudden change in vision. ? F - Face. Signs are sudden weakness or numbness of the face, or the face or eyelid drooping on one side. ? A - Arms. Signs are weakness or numbness in an arm. This happens suddenly and usually on one side of the body. ? S - Speech. Signs are sudden trouble speaking, slurred speech, or trouble understanding what people say. ? T - Time. Time to call emergency services. Write down what time symptoms started.  You have other signs of a stroke, such as: ? A sudden, severe headache with no known cause. ? Nausea or vomiting. ? Seizure. These symptoms may represent a serious problem that is an emergency. Do not wait to see if the symptoms will go away. Get medical help right away. Call your local emergency services (911 in the [place] Do not drive yourself   to the hospital. Summary  Atherosclerosis is narrowing and hardening of the arteries.  Arteries can become narrow or clogged with a buildup of fat, cholesterol, calcium, and other substances (plaque).  This condition may not cause any symptoms. If you do have symptoms, they are caused by damage to an area of your body that is not getting enough blood.  Treatment may include lifestyle changes and medicines. In some cases, surgery is needed. This information is not intended to replace advice given to you by your health care provider. Make sure you discuss any questions you have with your health care provider. Document Released: 01/19/2004 Document Revised: 02/07/2018 Document Reviewed: 07/04/2017 Elsevier Patient Education   2020 [employer]  

## 2019-10-02 NOTE — Patient Instructions (Addendum)
Thank you for taking time to come for your Medicare Wellness Visit. I appreciate your ongoing commitment to your health goals. Please review the following plan we discussed and let me know if I can assist you in the future.  Julie Greer LPN  Preventive Care 65 Years and Older, Male Preventive care refers to lifestyle choices and visits with your health care provider that can promote health and wellness. This includes:  A yearly physical exam. This is also called an annual well check.  Regular dental and eye exams.  Immunizations.  Screening for certain conditions.  Healthy lifestyle choices, such as diet and exercise. What can I expect for my preventive care visit? Physical exam Your health care provider will check:  Height and weight. These may be used to calculate body mass index (BMI), which is a measurement that tells if you are at a healthy weight.  Heart rate and blood pressure.  Your skin for abnormal spots. Counseling Your health care provider may ask you questions about:  Alcohol, tobacco, and drug use.  Emotional well-being.  Home and relationship well-being.  Sexual activity.  Eating habits.  History of falls.  Memory and ability to understand (cognition).  Work and work environment.  Pregnancy and menstrual history. What immunizations do I need?  Influenza (flu) vaccine  This is recommended every year. Tetanus, diphtheria, and pertussis (Tdap) vaccine  You may need a Td booster every 10 years. Varicella (chickenpox) vaccine  You may need this vaccine if you have not already been vaccinated. Zoster (shingles) vaccine  You may need this after age 60. Pneumococcal conjugate (PCV13) vaccine  One dose is recommended after age 65. Pneumococcal polysaccharide (PPSV23) vaccine  One dose is recommended after age 65. Measles, mumps, and rubella (MMR) vaccine  You may need at least one dose of MMR if you were born in 1957 or later. You may also  need a second dose. Meningococcal conjugate (MenACWY) vaccine  You may need this if you have certain conditions. Hepatitis A vaccine  You may need this if you have certain conditions or if you travel or work in places where you may be exposed to hepatitis A. Hepatitis B vaccine  You may need this if you have certain conditions or if you travel or work in places where you may be exposed to hepatitis B. Haemophilus influenzae type b (Hib) vaccine  You may need this if you have certain conditions. You may receive vaccines as individual doses or as more than one vaccine together in one shot (combination vaccines). Talk with your health care provider about the risks and benefits of combination vaccines. What tests do I need? Blood tests  Lipid and cholesterol levels. These may be checked every 5 years, or more frequently depending on your overall health.  Hepatitis C test.  Hepatitis B test. Screening  Lung cancer screening. You may have this screening every year starting at age 55 if you have a 30-pack-year history of smoking and currently smoke or have quit within the past 15 years.  Colorectal cancer screening. All adults should have this screening starting at age 50 and continuing until age 75. Your health care provider may recommend screening at age 45 if you are at increased risk. You will have tests every 1-10 years, depending on your results and the type of screening test.  Diabetes screening. This is done by checking your blood sugar (glucose) after you have not eaten for a while (fasting). You may have this done every 1-3   years.  Mammogram. This may be done every 1-2 years. Talk with your health care provider about how often you should have regular mammograms.  BRCA-related cancer screening. This may be done if you have a family history of breast, ovarian, tubal, or peritoneal cancers. Other tests  Sexually transmitted disease (STD) testing.  Bone density scan. This is done  to screen for osteoporosis. You may have this done starting at age 6. Follow these instructions at home: Eating and drinking  Eat a diet that includes fresh fruits and vegetables, whole grains, lean protein, and low-fat dairy products. Limit your intake of foods with high amounts of sugar, saturated fats, and salt.  Take vitamin and mineral supplements as recommended by your health care provider.  Do not drink alcohol if your health care provider tells you not to drink.  If you drink alcohol: ? Limit how much you have to 0-1 drink a day. ? Be aware of how much alcohol is in your drink. In the [place] one drink equals one 12 oz bottle of beer (355 mL), one 5 oz glass of wine (148 mL), or one 1 oz glass of hard liquor (44 mL). Lifestyle  Take daily care of your teeth and gums.  Stay active. Exercise for at least 30 minutes on 5 or more days each week.  Do not use any products that contain nicotine or tobacco, such as cigarettes, e-cigarettes, and chewing tobacco. If you need help quitting, ask your health care provider.  If you are sexually active, practice safe sex. Use a condom or other form of protection in order to prevent STIs (sexually transmitted infections).  Talk with your health care provider about taking a low-dose aspirin or statin. What's next?  Go to your health care provider once a year for a well check visit.  Ask your health care provider how often you should have your eyes and teeth checked.  Stay up to date on all vaccines. This information is not intended to replace advice given to you by your health care provider. Make sure you discuss any questions you have with your health care provider. Document Released: 11/25/2015 Document Revised: 10/23/2018 Document Reviewed: 10/23/2018 Elsevier Patient Education  2020 [employer].

## 2019-10-02 NOTE — Progress Notes (Signed)
Established Patient Office Visit  Subjective:  Patient ID: Andrew [name], male    DOB: 04-30-1951  Age: 68 y.o. MRN: [id number]  CC: No chief complaint on file.   HPI Andrew [name] presents for   Hypertension: Patient here for follow-up of elevated blood pressure.  He reports that while he was on HCTZ he would feel lightheaded but reports that it resolved.  Due to the lightheadedness on hctz 12.77m he did not want to take the Pravachol for fear of side effects. BP Readings from Last 3 Encounters:  10/02/19 (!) 110/50  10/02/19 (!) 149/66  06/16/19 118/60   Dyslipidemia: Patient presents for evaluation of lipids.  Compliance with treatment thus far has been poor.  A repeat fasting lipid profile was done.  The patient does not use medications that may worsen dyslipidemias (corticosteroids, progestins, anabolic steroids, diuretics, beta-blockers, amiodarone, cyclosporine, olanzapine). The patient exercises intermittently.  The patient is not known to have coexisting coronary artery disease.   He reports that he did not start the pravachol due to side effects of the HCTZ He is willing to try the medication now that he hctz side effects are better.  CKD-  He doe snot use NSAIDs He rarely uses alcohol He denies gout attacks Lab Results  Component Value Date   CREATININE 1.61 (H) 10/02/2019      Past Medical History:  Diagnosis Date  . Asthma   . Carotid artery stenosis, asymptomatic, bilateral 12/06/2017   Consider recheck Dopplers in 6 months -> 05/2018 Mild to moderate bilateral carotid artery stenosis [facility] show left internal carotid artery stenosis 50-69% with soft plaque.  Mild stenosis in the left common carotid artery <50%. Mild stenosis in the right common carotid artery <50% with moderate diffuse soft plaque.  Mild stenosis in the right e  . COCAINE ABUSE 01/30/2008   Qualifier: Diagnosis of  By: MAmil AmenMD,  EBenjamine Mola   . COPD [place] obstructive pulmonary disease) [place]   . Hypertension     No past surgical history on file.  No family history on file.  Social History   Socioeconomic History  . Marital status: Legally Separated    Spouse name: Not on file  . Number of children: 1  . Years of education: Not on file  . Highest education level: Not on file  Occupational History  . Not on file  Social Needs  . Financial resource strain: Not on file  . Food insecurity    Worry: Not on file    Inability: Not on file  . Transportation needs    Medical: Not on file    Non-medical: Not on file  Tobacco Use  . Smoking status: Current Some Day Smoker    Packs/day: 0.75    Years: 44.00    Pack years: 33.00    Types: Cigarettes  . Smokeless tobacco: Never Used  . Tobacco comment: 08/04 pack lasts pt for a week (3-4 cigs/daily)  Substance and Sexual Activity  . Alcohol use: Yes    Comment: occasional  . Drug use: No  . Sexual activity: Not on file  Lifestyle  . Physical activity    Days per week: Not on file    Minutes per session: Not on file  . Stress: Not on file  Relationships  . Social [place]: Not on file    Gets together: Not on file    Attends religious service: Not  on file    Active member of club or organization: Not on file    Attends meetings of clubs or organizations: Not on file    Relationship status: Not on file  . Intimate partner violence    Fear of current or ex partner: Not on file    Emotionally abused: Not on file    Physically abused: Not on file    Forced sexual activity: Not on file  Other Topics Concern  . Not on file  Social History Narrative  . Not on file    Outpatient Medications Prior to Visit  Medication Sig Dispense Refill  . acetaminophen (TYLENOL) 500 MG tablet Take 500 mg by mouth every 6 (six) hours as needed.    Marland Kitchen albuterol (PROVENTIL HFA;VENTOLIN HFA) 108 (90 Base) MCG/ACT inhaler Inhale 2 puffs into the  lungs every 4 (four) hours as needed for wheezing or shortness of breath (cough, shortness of breath or wheezing.). 1 Inhaler 11  . albuterol (PROVENTIL) (2.5 MG/3ML) 0.083% nebulizer solution Take 3 mLs (2.5 mg total) by nebulization every 6 (six) hours as needed for wheezing or shortness of breath. 150 mL 2  . alfuzosin (UROXATRAL) 10 MG 24 hr tablet     . cetirizine (ZYRTEC) 10 MG tablet TAKE 1 TABLET (10 MG TOTAL) BY MOUTH DAILY. 90 tablet 4  . cilostazol (PLETAL) 50 MG tablet Take 1 tablet (50 mg total) by mouth 2 (two) times daily. (Patient not taking: Reported on 10/02/2019)    . diltiazem (DILACOR XR) 240 MG 24 hr capsule Take 1 capsule (240 mg total) by mouth daily. 90 capsule 0  . ipratropium (ATROVENT) 0.03 % nasal spray Place 2 sprays into the nose 4 (four) times daily. (Patient taking differently: Place 2 sprays into the nose 4 (four) times daily as needed. ) 30 mL 1  . montelukast (SINGULAIR) 10 MG tablet Take 1 tablet (10 mg total) by mouth at bedtime. 90 tablet 3  . olmesartan (BENICAR) 40 MG tablet Take 1 tablet (40 mg total) by mouth daily. 90 tablet 0  . pravastatin (PRAVACHOL) 10 MG tablet Take 1 tablet (10 mg total) by mouth daily. 90 tablet 0  . aspirin EC 81 MG tablet Take 1 tablet (81 mg total) by mouth daily. (Patient not taking: Reported on 06/16/2019)    . azithromycin (ZITHROMAX) 250 MG tablet Take 2 tablets on day 1, one tablet daily (Patient not taking: Reported on 06/16/2019) 6 tablet 0  . Cholecalciferol (VITAMIN D3) 5000 units CAPS Take 1 capsule (5,000 Units total) by mouth daily. (Patient not taking: Reported on 06/16/2019) 180 capsule 0  . hydrochlorothiazide (HYDRODIURIL) 12.5 MG tablet TAKE 1 TABLET BY MOUTH EVERY DAY (Patient not taking: Reported on 10/02/2019) 90 tablet 1  . mometasone-formoterol (DULERA) 200-5 MCG/ACT AERO Inhale 2 puffs into the lungs 2 (two) times daily. (Patient not taking: Reported on 06/16/2019) 1 Inhaler 11   No facility-administered medications  prior to visit.     Allergies  Allergen Reactions  . Lisinopril Cough  . Lisinopril-Hydrochlorothiazide     dizziness    ROS Review of Systems Review of Systems  Constitutional: Negative for activity change, appetite change, chills and fever.  HENT: Negative for congestion, nosebleeds, trouble swallowing and voice change.   Respiratory: Negative for cough, shortness of breath and wheezing.   Gastrointestinal: Negative for diarrhea, nausea and vomiting.  Genitourinary: Negative for difficulty urinating, dysuria, flank pain and hematuria.  Musculoskeletal: Negative for back pain, joint swelling and neck pain.  Neurological: Negative for dizziness, speech difficulty, light-headedness and numbness.  See HPI. All other review of systems negative.     Objective:    Physical Exam  BP (!) 110/50   Pulse 86   Temp 98.3 F (36.8 C) (Oral)   Resp 16   Ht '6\' 3"'  (1.905 m)   Wt 200 lb (90.7 kg)   SpO2 98%   BMI 25.00 kg/m  Wt Readings from Last 3 Encounters:  10/02/19 200 lb (90.7 kg)  10/02/19 200 lb (90.7 kg)  06/16/19 194 lb 3.2 oz (88.1 kg)   Physical Exam  Constitutional: Oriented to person, place, and time. Appears well-developed and well-nourished.  HENT:  Head: Normocephalic and atraumatic.  Eyes: Conjunctivae and EOM are normal.  Cardiovascular: Normal rate, regular rhythm, normal heart sounds and intact distal pulses.  No murmur heard. Pulmonary/Chest: Effort normal and breath sounds normal. No stridor. No respiratory distress. Has no wheezes.  Neurological: Is alert and oriented to person, place, and time.  Skin: Skin is warm. Capillary refill takes less than 2 seconds.  Psychiatric: Has a normal mood and affect. Behavior is normal. Judgment and thought content normal.    Health Maintenance Due  Topic Date Due  . TETANUS/TDAP  02/05/1970  . PNA vac Low Risk Adult (2 of 2 - PPSV23) 09/02/2018    There are no preventive care reminders to display for this  patient.  No results found for: TSH Lab Results  Component Value Date   WBC 6.3 07/08/2018   HGB 14.2 07/08/2018   HCT 41.8 07/08/2018   MCV 85.3 07/08/2018   PLT 268.0 07/08/2018   Lab Results  Component Value Date   NA 140 10/02/2019   K 4.4 10/02/2019   CO2 20 10/02/2019   GLUCOSE 89 10/02/2019   BUN 13 10/02/2019   CREATININE 1.61 (H) 10/02/2019   BILITOT 0.5 10/02/2019   ALKPHOS 163 (H) 10/02/2019   AST 19 10/02/2019   ALT 10 10/02/2019   PROT 7.5 10/02/2019   ALBUMIN 4.7 10/02/2019   CALCIUM 9.8 10/02/2019   ANIONGAP 10 07/21/2017   Lab Results  Component Value Date   CHOL 263 (H) 10/02/2019   Lab Results  Component Value Date   HDL 60 10/02/2019   Lab Results  Component Value Date   LDLCALC 180 (H) 10/02/2019   Lab Results  Component Value Date   TRIG 127 10/02/2019   Lab Results  Component Value Date   CHOLHDL 4.4 10/02/2019   Lab Results  Component Value Date   HGBA1C 5.6 10/02/2019      Assessment & Plan:   Problem List Items Addressed This Visit      Cardiovascular and Mediastinum   Essential hypertension - Primary   Relevant Medications   aspirin EC 81 MG tablet     Genitourinary   Chronic kidney disease (CKD) stage G3a/A2, moderately decreased glomerular filtration rate (GFR) between 45-59 mL/min/1.73 square meter and albuminuria creatinine ratio between 30-299 mg/g (Chronic)   Relevant Medications   Cholecalciferol (VITAMIN D3) 50 MCG (2000 UT) capsule   Other Relevant Orders   CMP14+EGFR (Completed)   Hemoglobin A1c (Completed)   Lipid panel (Completed)     Other   Hyperlipidemia LDL goal <70  - previously noncompliant with    Relevant Medications   aspirin EC 81 MG tablet   Other Relevant Orders   CMP14+EGFR (Completed)   Lipid panel (Completed)   Prediabetes   Relevant Orders   CMP14+EGFR (Completed)  Hemoglobin A1c (Completed)    Other Visit Diagnoses    Stage 3b chronic kidney disease    -  Continue avoiding  NSAIDs Will refer to Nephrology Discussed difference between Urology and Nephrology   Relevant Orders   Ambulatory referral to Nephrology   Atherosclerosis of coronary artery bypass graft of native heart without angina pectoris       Relevant Medications   aspirin EC 81 MG tablet   Aortic atherosclerosis ([place])    -  Discussed compliance with aspirin and statin    Relevant Medications   aspirin EC 81 MG tablet   Acquired thrombophilia ([place])   -  Continue aspirin        Meds ordered this encounter  Medications  . DISCONTD: aspirin EC 81 MG tablet    Sig: Take 1 tablet (81 mg total) by mouth daily.    Dispense:     . aspirin EC 81 MG tablet    Sig: Take 1 tablet (81 mg total) by mouth daily.    Dispense:  90 tablet    Refill:  3  . Cholecalciferol (VITAMIN D3) 50 MCG (2000 UT) capsule    Sig: Take 1 capsule (2,000 Units total) by mouth daily.    Dispense:  30 capsule    Refill:  11  . Fluticasone-Salmeterol (ADVAIR DISKUS) 250-50 MCG/DOSE AEPB    Sig: Inhale 1 puff into the lungs 2 (two) times daily.    Dispense:  3 each    Refill:  1  . triamcinolone cream (KENALOG) 0.1 %    Sig: Apply 1 application topically 2 (two) times daily.    Dispense:  80 g    Refill:  0    Follow-up: Return in about 6 months (around 03/31/2020) for lipid.    Forrest Moron, MD

## 2019-10-02 NOTE — Progress Notes (Signed)
Presents today for [employer] Visit   Date of last exam: 10/02/2019  Interpreter used for this visit? No  Patient seen in clinic today at [facility] at [facility]  Patient Care Team: Forrest Moron, MD as PCP - General (Internal Medicine) McKenzie, Candee Furbish, MD as Consulting Physician (Urology) Nigel Mormon, MD as Consulting Physician (Cardiology) Tanda Rockers, MD as Consulting Physician (Pulmonary Disease)   Other items to address today:  Discussed immunizations Discussed Eye/Dental   Other Screening: Last screening for diabetes: 03/31/2019 Last lipid screening: 03/31/2019  ADVANCE DIRECTIVES: Discussed:  yes On File:no Materials Provided: yes   Immunization status:  Immunization History  Administered Date(s) Administered  . Influenza,inj,Quad PF,6+ Mos 01/24/2016, 09/02/2017  . Pneumococcal Conjugate-13 09/02/2017     Health Maintenance Due  Topic Date Due  . TETANUS/TDAP  02/05/1970  . MAMMOGRAM  02/05/2001  . Fecal DNA (Cologuard)  02/05/2001  . DEXA SCAN  02/06/2016  . PNA vac Low Risk Adult (2 of 2 - PPSV23) 09/02/2018     Functional Status Survey: Is the patient deaf or have difficulty hearing?: No Does the patient have difficulty seeing, even when wearing glasses/contacts?: No Does the patient have difficulty concentrating, remembering, or making decisions?: No Does the patient have difficulty walking or climbing stairs?: No Does the patient have difficulty dressing or bathing?: No Does the patient have difficulty doing errands alone such as visiting a doctor's office or shopping?: No   6CIT Screen 10/02/2019 09/02/2017  What Year? 0 points 0 points  What month? 0 points 0 points  What time? 0 points 0 points  Count back from 20 0 points 0 points  Months in reverse 0 points 0 points  Repeat phrase 0 points 0 points  Total Score 0 0        Clinical Support from 10/02/2019 in [place] [facility] [place]  AUDIT-C  Score  6       Home Environment:   Lives in two story No trouble climbing stairs Yes scattered rugs with grippers to prevent slipping No grab bars Adequate lighting/ no clutter  Patient walks a lot for [employer]  Bowls 2 x a week  Patient able to stand up in 30 seconds    Patient Active Problem List   Diagnosis Date Noted  . Prostate cancer ([place]) 01/14/2018  . Carotid artery stenosis, asymptomatic, bilateral 12/06/2017  . Vitamin D deficiency 12/03/2017  . Prediabetes 11/14/2017  . Chronic kidney disease (CKD) stage G3a/A2, moderately decreased glomerular filtration rate (GFR) between 45-59 mL/min/1.73 square meter and albuminuria creatinine ratio between 30-299 mg/g 11/13/2017  . Cigarette smoker 08/20/2017  . COPD GOLD 2 still smoking  07/31/2017  . Coronary atherosclerosis of native coronary artery 07/31/2017  . BPH (benign prostatic hyperplasia) 05/30/2016  . Allergic rhinitis 06/01/2008  . DENTAL CARIES 06/01/2008  . GERD 03/04/2008  . Hyperlipidemia LDL goal <70 01/30/2008  . Essential hypertension 01/30/2008  . PERIPHERAL VASCULAR DISEASE 01/30/2008  . ECZEMA 01/30/2008  . Folliculitis [id number]     Past Medical History:  Diagnosis Date  . Asthma   . Carotid artery stenosis, asymptomatic, bilateral 12/06/2017   Consider recheck Dopplers in 6 months -> 05/2018 Mild to moderate bilateral carotid artery stenosis Carotid Dopplers done [facility] cardiovascular 11/26/2017 show left internal carotid artery stenosis 50-69% with soft plaque.  Mild stenosis in the left common carotid artery <50%. Mild stenosis in the right common carotid artery <50% with moderate  diffuse soft plaque.  Mild stenosis in the right e  . COCAINE ABUSE 01/30/2008   Qualifier: Diagnosis of  By: Amil Amen MD, Benjamine Mola    . COPD (chronic obstructive pulmonary disease) ([place])   . Hypertension      History reviewed. No pertinent surgical history.   History reviewed. No pertinent  family history.   Social History   Socioeconomic History  . Marital status: Legally Separated    Spouse name: Not on file  . Number of children: 1  . Years of education: Not on file  . Highest education level: Not on file  Occupational History  . Not on file  Social Needs  . Financial resource strain: Not on file  . Food insecurity    Worry: Not on file    Inability: Not on file  . Transportation needs    Medical: Not on file    Non-medical: Not on file  Tobacco Use  . Smoking status: Current Some Day Smoker    Packs/day: 0.75    Years: 44.00    Pack years: 33.00    Types: Cigarettes  . Smokeless tobacco: Never Used  . Tobacco comment: 08/04 pack lasts pt for a week (3-4 cigs/daily)  Substance and Sexual Activity  . Alcohol use: Yes    Comment: occasional  . Drug use: No  . Sexual activity: Not on file  Lifestyle  . Physical activity    Days per week: Not on file    Minutes per session: Not on file  . Stress: Not on file  Relationships  . Social Herbalist on phone: Not on file    Gets together: Not on file    Attends religious service: Not on file    Active member of club or organization: Not on file    Attends meetings of clubs or organizations: Not on file    Relationship status: Not on file  . Intimate partner violence    Fear of current or ex partner: Not on file    Emotionally abused: Not on file    Physically abused: Not on file    Forced sexual activity: Not on file  Other Topics Concern  . Not on file  Social History Narrative  . Not on file     Allergies  Allergen Reactions  . Lisinopril Cough  . Lisinopril-Hydrochlorothiazide     dizziness     Prior to Admission medications   Medication Sig Start Date End Date Taking? Authorizing Provider  albuterol (PROVENTIL HFA;VENTOLIN HFA) 108 (90 Base) MCG/ACT inhaler Inhale 2 puffs into the lungs every 4 (four) hours as needed for wheezing or shortness of breath (cough, shortness of  breath or wheezing.). 06/29/17  Yes Shawnee Knapp, MD  albuterol (PROVENTIL) (2.5 MG/3ML) 0.083% nebulizer solution Take 3 mLs (2.5 mg total) by nebulization every 6 (six) hours as needed for wheezing or shortness of breath. 07/31/17  Yes Shawnee Knapp, MD  cetirizine (ZYRTEC) 10 MG tablet TAKE 1 TABLET (10 MG TOTAL) BY MOUTH DAILY. 09/16/18  Yes Shawnee Knapp, MD  diltiazem (DILACOR XR) 240 MG 24 hr capsule Take 1 capsule (240 mg total) by mouth daily. 07/31/19  Yes Stallings, Zoe A, MD  ipratropium (ATROVENT) 0.03 % nasal spray Place 2 sprays into the nose 4 (four) times daily. Patient taking differently: Place 2 sprays into the nose 4 (four) times daily as needed.  03/17/18  Yes Shawnee Knapp, MD  montelukast (SINGULAIR) 10 MG tablet Take  1 tablet (10 mg total) by mouth at bedtime. 09/16/18  Yes Shawnee Knapp, MD  olmesartan (BENICAR) 40 MG tablet Take 1 tablet (40 mg total) by mouth daily. 07/31/19  Yes Delia Chimes A, MD  pravastatin (PRAVACHOL) 10 MG tablet Take 1 tablet (10 mg total) by mouth daily. 05/13/19  Yes Delia Chimes A, MD  acetaminophen (TYLENOL) 500 MG tablet Take 500 mg by mouth every 6 (six) hours as needed.    [provider]  alfuzosin (UROXATRAL) 10 MG 24 hr tablet  12/31/17   [provider]  aspirin EC 81 MG tablet Take 1 tablet (81 mg total) by mouth daily. Patient not taking: Reported on 06/16/2019 12/14/17   Shawnee Knapp, MD  azithromycin [facility]) 250 MG tablet Take 2 tablets on day 1, one tablet daily Patient not taking: Reported on 06/16/2019 12/24/18   Forrest Moron, MD  Cholecalciferol (VITAMIN D3) 5000 units CAPS Take 1 capsule (5,000 Units total) by mouth daily. Patient not taking: Reported on 06/16/2019 01/13/18   Shawnee Knapp, MD  cilostazol (PLETAL) 50 MG tablet Take 1 tablet (50 mg total) by mouth 2 (two) times daily. Patient not taking: Reported on 10/02/2019 12/14/17   Shawnee Knapp, MD  hydrochlorothiazide (HYDRODIURIL) 12.5 MG tablet TAKE 1 TABLET BY MOUTH EVERY DAY  Patient not taking: Reported on 10/02/2019 05/17/19   Forrest Moron, MD  mometasone-formoterol (DULERA) 200-5 MCG/ACT AERO Inhale 2 puffs into the lungs 2 (two) times daily. Patient not taking: Reported on 06/16/2019 07/08/18   Tanda Rockers, MD     Depression screen [facility] 2/9 10/02/2019 03/31/2019 01/29/2019 12/24/2018 09/16/2018  Decreased Interest 0 0 0 0 0  Down, Depressed, Hopeless 0 0 0 0 0  PHQ - 2 Score 0 0 0 0 0     Fall Risk  10/02/2019 03/31/2019 01/29/2019 12/24/2018 09/16/2018  Falls in the past year? 0 0 1 0 0  Number falls in past yr: 0 0 0 0 0  Injury with Fall? 0 0 0 0 0  Risk for fall due to : History of fall(s) - - - -  Follow up Falls evaluation completed;Education provided Falls evaluation completed Falls evaluation completed - -      PHYSICAL EXAM: BP (!) 149/66 (BP Location: Right Arm, Patient Position: Sitting, Cuff Size: Normal)   Pulse 86   Temp 98.3 F (36.8 C) (Oral)   Resp 16   Ht 6' 3.75" (1.924 m)   Wt 200 lb (90.7 kg)   SpO2 98%   BMI 24.51 kg/m    Wt Readings from Last 3 Encounters:  10/02/19 200 lb (90.7 kg)  06/16/19 194 lb 3.2 oz (88.1 kg)  03/31/19 195 lb 12.8 oz (88.8 kg)       Education/Counseling provided regarding diet and exercise, prevention of chronic diseases, smoking/tobacco cessation, if applicable, and reviewed "Covered Medicare Preventive Services."

## 2019-10-03 LAB — LIPID PANEL
Chol/HDL Ratio: 4.4 ratio (ref 0.0–5.0)
Cholesterol, Total: 263 mg/dL — ABNORMAL HIGH (ref 100–199)
HDL: 60 mg/dL (ref >39)
LDL Chol Calc (NIH): 180 mg/dL — ABNORMAL HIGH (ref 0–99)
Triglycerides: 127 mg/dL (ref 0–149)
VLDL Cholesterol Cal: 23 mg/dL (ref 5–40)

## 2019-10-03 LAB — CMP14+EGFR
ALT: 10 IU/L (ref 0–44)
AST: 19 IU/L (ref 0–40)
Albumin/Globulin Ratio: 1.7 (ref 1.2–2.2)
Albumin: 4.7 g/dL (ref 3.8–4.8)
Alkaline Phosphatase: 163 IU/L — ABNORMAL HIGH (ref 39–117)
BUN/Creatinine Ratio: 8 — ABNORMAL LOW (ref 10–24)
BUN: 13 mg/dL (ref 8–27)
Bilirubin Total: 0.5 mg/dL (ref 0.0–1.2)
CO2: 20 mmol/L (ref 20–29)
Calcium: 9.8 mg/dL (ref 8.6–10.2)
Chloride: 102 mmol/L (ref 96–106)
Creatinine, Ser: 1.61 mg/dL — ABNORMAL HIGH (ref 0.76–1.27)
GFR calc Af Amer: 50 mL/min/{1.73_m2} — ABNORMAL LOW (ref >59)
GFR calc non Af Amer: 43 mL/min/{1.73_m2} — ABNORMAL LOW (ref >59)
Globulin, Total: 2.8 g/dL (ref 1.5–4.5)
Glucose: 89 mg/dL (ref 65–99)
Potassium: 4.4 mmol/L (ref 3.5–5.2)
Sodium: 140 mmol/L (ref 134–144)
Total Protein: 7.5 g/dL (ref 6.0–8.5)

## 2019-10-03 LAB — HEMOGLOBIN A1C
Est. average glucose Bld gHb Est-mCnc: 114 mg/dL
Hgb A1c MFr Bld: 5.6 % (ref 4.8–5.6)

## 2019-10-26 ENCOUNTER — Other Ambulatory Visit: Payer: Self-pay | Admitting: *Deleted

## 2019-10-26 ENCOUNTER — Encounter: Payer: Self-pay | Admitting: *Deleted

## 2019-10-26 DIAGNOSIS — F17210 Nicotine dependence, cigarettes, uncomplicated: Secondary | ICD-10-CM

## 2019-10-26 DIAGNOSIS — Z87891 Personal history of nicotine dependence: Secondary | ICD-10-CM

## 2019-10-27 ENCOUNTER — Other Ambulatory Visit: Payer: Self-pay | Admitting: Family Medicine

## 2019-10-30 ENCOUNTER — Telehealth: Payer: Self-pay | Admitting: Family Medicine

## 2019-10-30 DIAGNOSIS — E785 Hyperlipidemia, unspecified: Secondary | ICD-10-CM

## 2019-10-30 NOTE — Telephone Encounter (Signed)
John, from [place], called regarding a fax for pts drug therapy for the pts statin use due to cardiovascular disease. Please advise.     [phone]

## 2019-11-04 MED ORDER — PRAVASTATIN SODIUM 10 MG PO TABS: 10.0000 mg | ORAL_TABLET | Freq: Every day | ORAL | 1 refills | Status: DC

## 2019-11-04 NOTE — Telephone Encounter (Signed)
Rx pravastatin has been sent to [facility] per pharmacy request.

## 2019-11-07 DIAGNOSIS — H2513 Age-related nuclear cataract, bilateral: Secondary | ICD-10-CM | POA: Diagnosis not present

## 2019-11-07 DIAGNOSIS — H5203 Hypermetropia, bilateral: Secondary | ICD-10-CM | POA: Diagnosis not present

## 2019-11-07 DIAGNOSIS — H31093 Other chorioretinal scars, bilateral: Secondary | ICD-10-CM | POA: Diagnosis not present

## 2019-11-07 DIAGNOSIS — H52223 Regular astigmatism, bilateral: Secondary | ICD-10-CM | POA: Diagnosis not present

## 2019-11-07 DIAGNOSIS — H524 Presbyopia: Secondary | ICD-10-CM | POA: Diagnosis not present

## 2019-11-14 DIAGNOSIS — H524 Presbyopia: Secondary | ICD-10-CM | POA: Diagnosis not present

## 2019-11-14 DIAGNOSIS — H52223 Regular astigmatism, bilateral: Secondary | ICD-10-CM | POA: Diagnosis not present

## 2019-12-28 ENCOUNTER — Other Ambulatory Visit: Payer: Self-pay | Admitting: Family Medicine

## 2020-01-01 ENCOUNTER — Ambulatory Visit: Payer: Medicare HMO | Admitting: Family Medicine

## 2020-01-04 ENCOUNTER — Telehealth: Payer: Self-pay | Admitting: Family Medicine

## 2020-01-04 ENCOUNTER — Other Ambulatory Visit: Payer: Self-pay

## 2020-01-04 DIAGNOSIS — J301 Allergic rhinitis due to pollen: Secondary | ICD-10-CM

## 2020-01-04 MED ORDER — CETIRIZINE HCL 10 MG PO TABS: ORAL_TABLET | ORAL | 4 refills | Status: DC

## 2020-01-04 NOTE — Telephone Encounter (Signed)
Pt calling to request med refill on zyrtec. Last refilled by Brigitte Pulse. Confirmed [facility] on [address]

## 2020-01-06 ENCOUNTER — Ambulatory Visit: Payer: Medicare HMO | Admitting: Urology

## 2020-02-13 ENCOUNTER — Encounter: Payer: Self-pay | Admitting: Family Medicine

## 2020-02-20 ENCOUNTER — Other Ambulatory Visit: Payer: Self-pay | Admitting: Family Medicine

## 2020-02-20 NOTE — Telephone Encounter (Signed)
Requested Prescriptions  Pending Prescriptions Disp Refills  . DILT-XR 240 MG 24 hr capsule [Pharmacy Med Name: DILT XR 240 MG CAPSULE] 90 capsule 0    Sig: TAKE 1 CAPSULE BY MOUTH EVERY DAY     Cardiovascular:  Calcium Channel Blockers Passed - 02/20/2020  9:34 AM      Passed - Last BP in normal range    BP Readings from Last 1 Encounters:  10/02/19 (!) 110/50         Passed - Valid encounter within last 6 months    Recent Outpatient Visits          4 months ago Medicare annual wellness visit, subsequent   [facility] Care at [facility], Arlie Solomons, MD   4 months ago Essential hypertension   [facility] at [facility], Arlie Solomons, MD   10 months ago Essential hypertension   Primary Care at [facility], Arlie Solomons, MD   1 year ago Sacroiliac joint pain   Primary Care at [place], MD   1 year ago Allergic rhinitis due to pollen, unspecified seasonality   Primary Care at [place], MD      Future Appointments            In 1 month Forrest Moron, MD Primary Care at [place], [place]   In 3 months Melvyn Novas, Christena Deem, MD [facility]

## 2020-03-23 ENCOUNTER — Other Ambulatory Visit: Payer: Self-pay

## 2020-03-23 DIAGNOSIS — Z720 Tobacco use: Secondary | ICD-10-CM | POA: Diagnosis not present

## 2020-03-23 DIAGNOSIS — I129 Hypertensive chronic kidney disease with stage 1 through stage 4 chronic kidney disease, or unspecified chronic kidney disease: Secondary | ICD-10-CM | POA: Diagnosis not present

## 2020-03-23 DIAGNOSIS — R361 Hematospermia: Secondary | ICD-10-CM | POA: Diagnosis not present

## 2020-03-23 DIAGNOSIS — R7303 Prediabetes: Secondary | ICD-10-CM | POA: Diagnosis not present

## 2020-03-23 DIAGNOSIS — I10 Essential (primary) hypertension: Secondary | ICD-10-CM

## 2020-03-23 DIAGNOSIS — N1831 Chronic kidney disease, stage 3a: Secondary | ICD-10-CM | POA: Diagnosis not present

## 2020-03-23 DIAGNOSIS — N1832 Chronic kidney disease, stage 3b: Secondary | ICD-10-CM | POA: Diagnosis not present

## 2020-03-23 DIAGNOSIS — J449 Chronic obstructive pulmonary disease, unspecified: Secondary | ICD-10-CM | POA: Diagnosis not present

## 2020-03-23 DIAGNOSIS — E785 Hyperlipidemia, unspecified: Secondary | ICD-10-CM | POA: Diagnosis not present

## 2020-03-23 MED ORDER — OLMESARTAN MEDOXOMIL 40 MG PO TABS: 40.0000 mg | ORAL_TABLET | Freq: Every day | ORAL | 0 refills | Status: DC

## 2020-03-24 ENCOUNTER — Other Ambulatory Visit: Payer: Self-pay | Admitting: Nephrology

## 2020-03-24 DIAGNOSIS — N1831 Chronic kidney disease, stage 3a: Secondary | ICD-10-CM

## 2020-03-25 ENCOUNTER — Ambulatory Visit: Payer: Medicare HMO | Admitting: Family Medicine

## 2020-03-28 ENCOUNTER — Encounter: Payer: Self-pay | Admitting: Family Medicine

## 2020-03-29 ENCOUNTER — Other Ambulatory Visit: Payer: Self-pay

## 2020-03-29 ENCOUNTER — Encounter: Payer: Self-pay | Admitting: Emergency Medicine

## 2020-03-29 ENCOUNTER — Ambulatory Visit (INDEPENDENT_AMBULATORY_CARE_PROVIDER_SITE_OTHER): Payer: Medicare HMO | Admitting: Emergency Medicine

## 2020-03-29 VITALS — BP 153/68 | HR 70 | Temp 97.6°F | Ht 75.0 in | Wt 196.0 lb

## 2020-03-29 DIAGNOSIS — S76211A Strain of adductor muscle, fascia and tendon of right thigh, initial encounter: Secondary | ICD-10-CM | POA: Diagnosis not present

## 2020-03-29 DIAGNOSIS — E785 Hyperlipidemia, unspecified: Secondary | ICD-10-CM

## 2020-03-29 DIAGNOSIS — Z23 Encounter for immunization: Secondary | ICD-10-CM | POA: Diagnosis not present

## 2020-03-29 DIAGNOSIS — J301 Allergic rhinitis due to pollen: Secondary | ICD-10-CM

## 2020-03-29 DIAGNOSIS — I10 Essential (primary) hypertension: Secondary | ICD-10-CM

## 2020-03-29 DIAGNOSIS — R7303 Prediabetes: Secondary | ICD-10-CM

## 2020-03-29 DIAGNOSIS — Z1211 Encounter for screening for malignant neoplasm of colon: Secondary | ICD-10-CM | POA: Diagnosis not present

## 2020-03-29 DIAGNOSIS — Z7689 Persons encountering health services in other specified circumstances: Secondary | ICD-10-CM

## 2020-03-29 MED ORDER — ALBUTEROL SULFATE (2.5 MG/3ML) 0.083% IN NEBU: 2.5000 mg | INHALATION_SOLUTION | Freq: Four times a day (QID) | RESPIRATORY_TRACT | 2 refills | Status: DC | PRN

## 2020-03-29 MED ORDER — MONTELUKAST SODIUM 10 MG PO TABS: 10.0000 mg | ORAL_TABLET | Freq: Every day | ORAL | 3 refills | Status: DC

## 2020-03-29 MED ORDER — TRIAMCINOLONE ACETONIDE 0.1 % EX CREA: 1.0000 "application " | TOPICAL_CREAM | Freq: Two times a day (BID) | CUTANEOUS | 0 refills | Status: DC

## 2020-03-29 NOTE — Progress Notes (Signed)
Andrew Andrew [name] 69 y.o.   Chief Complaint  Patient presents with  . R groin area pain / R hip pain    2 to 3 weeks   . Hypertension    120 to 150/ to 70 to 80's at home readings    HISTORY OF PRESENT ILLNESS: This is a 69 y.o. male first visit with me, here to establish care.  Used to see Dr. Nolon Rod.  Patient has several chronic medical problems: 1.  Hypertension: On Benicar 40 mg daily 2.  COPD: On Advair and albuterol as needed.  Smokes 1 to 2 cigarettes/day. 3.  Chronic kidney disease: Saw nephrologist last week and had blood work done.  Told his kidneys are stable.  Stage III. 4.  Peripheral vascular disease 5.  Dyslipidemia: On pravastatin 10 mg daily 6.  Prediabetes 7.  History of prostate cancer: Stable.  Saw urologist last year. Today complaining of pain to Andrew Andrew [name] area for the past 2 to 3 weeks.  Bowls regularly and thinks it may be related.  Stretching helps. No other complaints or medical concerns today. Problem list reviewed with patient.  Fully vaccinated against Covid.  HPI   Prior to Admission medications   Medication Sig Start Date End Date Taking? Authorizing Provider  albuterol (PROVENTIL HFA;VENTOLIN HFA) 108 (90 Base) MCG/ACT inhaler Inhale 2 puffs into the lungs every 4 (four) hours as needed for wheezing or shortness of breath (cough, shortness of breath or wheezing.). 06/29/17  Yes Shawnee Knapp, MD  albuterol (PROVENTIL) (2.5 MG/3ML) 0.083% nebulizer solution Take 3 mLs (2.5 mg total) by nebulization every 6 (six) hours as needed for wheezing or shortness of breath. 03/29/20  Yes Hasna Stefanik, Ines Bloomer, MD  Cholecalciferol (VITAMIN D3) 50 MCG (2000 UT) capsule Take 1 capsule (2,000 Units total) by mouth daily. 10/02/19  Yes Stallings, Zoe A, MD  Fluticasone-Salmeterol (ADVAIR DISKUS) 250-50 MCG/DOSE AEPB Inhale 1 puff into the lungs 2 (two) times daily. 10/02/19  Yes Delia Chimes A, MD  pravastatin (PRAVACHOL) 10 MG tablet Take 1 tablet (10 mg total)  by mouth daily. 11/04/19  Yes Stallings, Zoe A, MD  triamcinolone cream (KENALOG) 0.1 % Apply 1 application topically 2 (two) times daily. 03/29/20  Yes Corneluis Allston, Ines Bloomer, MD  alfuzosin (UROXATRAL) 10 MG 24 hr tablet  12/31/17   [provider]  aspirin EC 81 MG tablet Take 1 tablet (81 mg total) by mouth daily. Patient not taking: Reported on 03/29/2020 10/02/19   Forrest Moron, MD  cetirizine (ZYRTEC) 10 MG tablet TAKE 1 TABLET (10 MG TOTAL) BY MOUTH DAILY. Patient not taking: Reported on 03/29/2020 01/04/20   Forrest Moron, MD  cilostazol (PLETAL) 50 MG tablet Take 1 tablet (50 mg total) by mouth 2 (two) times daily. Patient not taking: Reported on 10/02/2019 12/14/17   Shawnee Knapp, MD  DILT-XR 240 MG 24 hr capsule TAKE 1 CAPSULE BY MOUTH EVERY DAY Patient not taking: Reported on 03/29/2020 02/20/20   Forrest Moron, MD  ipratropium (ATROVENT) 0.03 % nasal spray Place 2 sprays into the nose 4 (four) times daily. Patient not taking: Reported on 03/29/2020 03/17/18   Shawnee Knapp, MD  montelukast (SINGULAIR) 10 MG tablet Take 1 tablet (10 mg total) by mouth at bedtime. 03/29/20   Horald Pollen, MD  olmesartan (BENICAR) 40 MG tablet Take 1 tablet (40 mg total) by mouth daily. Patient not taking: Reported on 03/29/2020 03/23/20   Forrest Moron, MD  Allergies  Allergen Reactions  . Lisinopril Cough  . Lisinopril-Hydrochlorothiazide     dizziness    Patient Active Problem List   Diagnosis Date Noted  . Prostate cancer (Andrew Andrew [name]) 01/14/2018  . Carotid artery stenosis, asymptomatic, bilateral 12/06/2017  . Vitamin D deficiency 12/03/2017  . Prediabetes 11/14/2017  . Chronic kidney disease (CKD) stage G3a/A2, moderately decreased glomerular filtration rate (GFR) between 45-59 mL/min/1.73 square meter and albuminuria creatinine ratio between 30-299 mg/g 11/13/2017  . Cigarette smoker 08/20/2017  . COPD GOLD 2 still smoking  07/31/2017  . Coronary atherosclerosis of native  coronary artery 07/31/2017  . BPH (benign prostatic hyperplasia) 05/30/2016  . DENTAL CARIES 06/01/2008  . GERD 03/04/2008  . Hyperlipidemia LDL goal <70 01/30/2008  . Essential hypertension 01/30/2008  . PERIPHERAL VASCULAR DISEASE 01/30/2008    Past Medical History:  Diagnosis Date  . Asthma   . Carotid artery stenosis, asymptomatic, bilateral 12/06/2017   Consider recheck Dopplers in 6 months -> 05/2018 Mild to moderate bilateral carotid artery stenosis Carotid Dopplers done at [name] 11/26/2017 show left internal carotid artery stenosis 50-69% with soft plaque.  Mild stenosis in the left common carotid artery <50%. Mild stenosis in the Andrew [name] common carotid artery <50% with moderate diffuse soft plaque.  Mild stenosis in the Andrew [name] e  . COCAINE ABUSE 01/30/2008   Qualifier: Diagnosis of  By: Amil Amen MD, Benjamine Mola    . COPD (chronic obstructive pulmonary disease) (Andrew Andrew [name])   . Hypertension     History reviewed. No pertinent surgical history.  Social History   Socioeconomic History  . Marital status: Legally Separated    Spouse name: Not on file  . Number of children: 1  . Years of education: Not on file  . Highest education level: Not on file  Occupational History  . Not on file  Tobacco Use  . Smoking status: Current Some Day Smoker    Packs/day: 0.75    Years: 44.00    Pack years: 33.00    Types: Cigarettes  . Smokeless tobacco: Never Used  . Tobacco comment: 08/04 pack lasts pt for a week (3-4 cigs/daily)  Substance and Sexual Activity  . Alcohol use: Yes    Comment: occasional  . Drug use: No  . Sexual activity: Not on file  Other Topics Concern  . Not on file  Social History Narrative  . Not on file   Social Determinants of Health   Financial Resource Strain:   . Difficulty of Paying Living Expenses:   Food Insecurity:   . Worried About Charity fundraiser in the Last Year:   . Arboriculturist in the Last Year:   Transportation Needs:   .  Film/video editor (Medical):   Marland Kitchen Lack of Transportation (Non-Medical):   Physical Activity:   . Days of Exercise per Week:   . Minutes of Exercise per Session:   Stress:   . Feeling of Stress :   Social Connections:   . Frequency of Communication with Friends and Family:   . Frequency of Social Gatherings with Friends and Family:   . Attends Religious Services:   . Active Member of Clubs or Organizations:   . Attends Archivist Meetings:   Marland Kitchen Marital Status:   Intimate Partner Violence:   . Fear of Current or Ex-Partner:   . Emotionally Abused:   Marland Kitchen Physically Abused:   . Sexually Abused:     History reviewed. No pertinent family history.   Review of  Systems  Constitutional: Negative.  Negative for chills, fever and malaise/fatigue.  HENT: Negative.  Negative for congestion and sore throat.   Respiratory: Negative.  Negative for cough and shortness of breath.   Cardiovascular: Negative.  Negative for chest pain and palpitations.  Gastrointestinal: Negative.  Negative for abdominal pain, blood in stool, diarrhea, melena, nausea and vomiting.  Genitourinary: Negative.  Negative for dysuria and hematuria.  Musculoskeletal: Negative for back pain, myalgias and neck pain.       Pain to Andrew [name] groin area  Skin: Negative.  Negative for rash.  Neurological: Negative.  Negative for dizziness and headaches.  All other systems reviewed and are negative.   Today's Vitals   03/29/20 1528  BP: (!) 153/68  Pulse: 70  Temp: 97.6 F (36.4 C)  SpO2: 96%  Weight: 196 lb (88.9 kg)  Height: 6\' 3"  (1.905 m)   Body mass index is 24.5 kg/m.  Physical Exam Vitals reviewed.  Constitutional:      Appearance: Normal appearance.  HENT:     Head: Normocephalic.  Eyes:     Extraocular Movements: Extraocular movements intact.     Conjunctiva/sclera: Conjunctivae normal.     Pupils: Pupils are equal, round, and reactive to light.  Cardiovascular:     Rate and Rhythm: Normal  rate and regular rhythm.     Pulses: Normal pulses.     Heart sounds: Normal heart sounds.  Pulmonary:     Effort: Pulmonary effort is normal.     Breath sounds: Normal breath sounds.  Abdominal:     Palpations: Abdomen is soft.     Tenderness: There is no abdominal tenderness.  Musculoskeletal:        General: Normal range of motion.     Cervical back: Normal range of motion and neck supple.     Comments: Andrew [name] groin area: Mild tenderness to Andrew Andrew [name] ligament.  No hernias or lymphadenopathy.  Skin:    General: Skin is warm and dry.     Capillary Refill: Capillary refill takes less than 2 seconds.  Neurological:     General: No focal deficit present.     Mental Status: He is alert and oriented to person, place, and time.  Psychiatric:        Mood and Affect: Mood normal.        Behavior: Behavior normal.      ASSESSMENT & PLAN: Ramzi was seen today for r groin area pain / r hip pain and hypertension.  Diagnoses and all orders for this visit:  Andrew [name] strain, Andrew [name], initial encounter  Seasonal allergic rhinitis due to pollen -     montelukast (SINGULAIR) 10 MG tablet; Take 1 tablet (10 mg total) by mouth at bedtime.  Prediabetes  Hyperlipidemia LDL goal <70  Essential hypertension  Encounter for immunization -     Tdap vaccine greater than or equal to 7yo IM -     Pneumococcal polysaccharide vaccine 23-valent greater than or equal to 2yo subcutaneous/IM  Encounter for screening colonoscopy  Screening for colon cancer -     Cologuard  Encounter to establish care  Other orders -     albuterol (PROVENTIL) (2.5 MG/3ML) 0.083% nebulizer solution; Take 3 mLs (2.5 mg total) by nebulization every 6 (six) hours as needed for wheezing or shortness of breath. -     triamcinolone cream (KENALOG) 0.1 %; Apply 1 application topically 2 (two) times daily.    Patient Instructions       If you have  lab work done today you will be contacted with your lab results  within the next 2 weeks.  If you have not heard from [place] then please contact us. The fastest way to get your results is to register for My Chart.   IF you received an x-ray today, you will receive an invoice from [facility]. Please contact [facility] at [phone] with questions or concerns regarding your invoice.   IF you received labwork today, you will receive an invoice from [facility]. Please contact [facility] at [phone] with questions or concerns regarding your invoice.   Our billing staff will not be able to assist you with questions regarding bills from these companies.  You will be contacted with the lab results as soon as they are available. The fastest way to get your results is to activate your My Chart account. Instructions are located on the last page of this paperwork. If you have not heard from [place] regarding the results in 2 weeks, please contact this office.      Health Maintenance After Age 46 After age 19, you are at a higher risk for certain long-term diseases and infections as well as injuries from falls. Falls are a major cause of broken bones and head injuries in people who are older than age 72. Getting regular preventive care can help to keep you healthy and well. Preventive care includes getting regular testing and making lifestyle changes as recommended by your health care provider. Talk with your health care provider about:  Which screenings and tests you should have. A screening is a test that checks for a disease when you have no symptoms.  A diet and exercise plan that is Andrew [name] for you. What should I know about screenings and tests to prevent falls? Screening and testing are the best ways to find a health problem early. Early diagnosis and treatment give you the best chance of managing medical conditions that are common after age 5. Certain conditions and lifestyle choices may make you more likely to have a fall. Your health care provider may  recommend:  Regular vision checks. Poor vision and conditions such as cataracts can make you more likely to have a fall. If you wear glasses, make sure to get your prescription updated if your vision changes.  Medicine review. Work with your health care provider to regularly review all of the medicines you are taking, including over-the-counter medicines. Ask your health care provider about any side effects that may make you more likely to have a fall. Tell your health care provider if any medicines that you take make you feel dizzy or sleepy.  Osteoporosis screening. Osteoporosis is a condition that causes the bones to get weaker. This can make the bones weak and cause them to break more easily.  Blood pressure screening. Blood pressure changes and medicines to control blood pressure can make you feel dizzy.  Strength and balance checks. Your health care provider may recommend certain tests to check your strength and balance while standing, walking, or changing positions.  Foot health exam. Foot pain and numbness, as well as not wearing proper footwear, can make you more likely to have a fall.  Depression screening. You may be more likely to have a fall if you have a fear of falling, feel emotionally low, or feel unable to do activities that you used to do.  Alcohol use screening. Using too much alcohol can affect your balance and may make you more likely to have a fall. What  actions can I take to lower my risk of falls? General instructions  Talk with your health care provider about your risks for falling. Tell your health care provider if: ? You fall. Be sure to tell your health care provider about all falls, even ones that seem minor. ? You feel dizzy, sleepy, or off-balance.  Take over-the-counter and prescription medicines only as told by your health care provider. These include any supplements.  Eat a healthy diet and maintain a healthy weight. A healthy diet includes low-fat dairy  products, low-fat (lean) meats, and fiber from whole grains, beans, and lots of fruits and vegetables. Home safety  Remove any tripping hazards, such as rugs, cords, and clutter.  Install safety equipment such as grab bars in bathrooms and safety rails on stairs.  Keep rooms and walkways well-lit. Activity   Follow a regular exercise program to stay fit. This will help you maintain your balance. Ask your health care provider what types of exercise are appropriate for you.  If you need a cane or walker, use it as recommended by your health care provider.  Wear supportive shoes that have nonskid soles. Lifestyle  Do not drink alcohol if your health care provider tells you not to drink.  If you drink alcohol, limit how much you have: ? 0-1 drink a day for women. ? 0-2 drinks a day for men.  Be aware of how much alcohol is in your drink. In the [place] one drink equals one typical bottle of beer (12 oz), one-half glass of wine (5 oz), or one shot of hard liquor (1 oz).  Do not use any products that contain nicotine or tobacco, such as cigarettes and e-cigarettes. If you need help quitting, ask your health care provider. Summary  Having a healthy lifestyle and getting preventive care can help to protect your health and wellness after age 10.  Screening and testing are the best way to find a health problem early and help you avoid having a fall. Early diagnosis and treatment give you the best chance for managing medical conditions that are more common for people who are older than age 43.  Falls are a major cause of broken bones and head injuries in people who are older than age 69. Take precautions to prevent a fall at home.  Work with your health care provider to learn what changes you can make to improve your health and wellness and to prevent falls. This information is not intended to replace advice given to you by your health care provider. Make sure you discuss any questions you  have with your health care provider. Document Revised: 02/19/2019 Document Reviewed: 09/11/2017 Elsevier Patient Education  [place].  Hypertension, Adult High blood pressure (hypertension) is when the force of blood pumping through the arteries is too strong. The arteries are the blood vessels that carry blood from the heart throughout the body. Hypertension forces the heart to work harder to pump blood and may cause arteries to become narrow or stiff. Untreated or uncontrolled hypertension can cause a heart attack, heart failure, a stroke, kidney disease, and other problems. A blood pressure reading consists of a higher number over a lower number. Ideally, your blood pressure should be below 120/80. The first ("top") number is called the systolic pressure. It is a measure of the pressure in your arteries as your heart beats. The second ("bottom") number is called the diastolic pressure. It is a measure of the pressure in your arteries as the  heart relaxes. What are the causes? The exact cause of this condition is not known. There are some conditions that result in or are related to high blood pressure. What increases the risk? Some risk factors for high blood pressure are under your control. The following factors may make you more likely to develop this condition:  Smoking.  Having type 2 diabetes mellitus, high cholesterol, or both.  Not getting enough exercise or physical activity.  Being overweight.  Having too much fat, sugar, calories, or salt (sodium) in your diet.  Drinking too much alcohol. Some risk factors for high blood pressure may be difficult or impossible to change. Some of these factors include:  Having chronic kidney disease.  Having a family history of high blood pressure.  Age. Risk increases with age.  Race. You may be at higher risk if you are African American.  Gender. Men are at higher risk than women before age 60. After age 56, women are at higher  risk than men.  Having obstructive sleep apnea.  Stress. What are the signs or symptoms? High blood pressure may not cause symptoms. Very high blood pressure (hypertensive crisis) may cause:  Headache.  Anxiety.  Shortness of breath.  Nosebleed.  Nausea and vomiting.  Vision changes.  Severe chest pain.  Seizures. How is this diagnosed? This condition is diagnosed by measuring your blood pressure while you are seated, with your arm resting on a flat surface, your legs uncrossed, and your feet flat on the floor. The cuff of the blood pressure monitor will be placed directly against the skin of your upper arm at the level of your heart. It should be measured at least twice using the same arm. Certain conditions can cause a difference in blood pressure between your Andrew [name] and left arms. Certain factors can cause blood pressure readings to be lower or higher than normal for a short period of time:  When your blood pressure is higher when you are in a health care provider's office than when you are at home, this is called white coat hypertension. Most people with this condition do not need medicines.  When your blood pressure is higher at home than when you are in a health care provider's office, this is called masked hypertension. Most people with this condition may need medicines to control blood pressure. If you have a high blood pressure reading during one visit or you have normal blood pressure with other risk factors, you may be asked to:  Return on a different day to have your blood pressure checked again.  Monitor your blood pressure at home for 1 week or longer. If you are diagnosed with hypertension, you may have other blood or imaging tests to help your health care provider understand your overall risk for other conditions. How is this treated? This condition is treated by making healthy lifestyle changes, such as eating healthy foods, exercising more, and reducing your  alcohol intake. Your health care provider may prescribe medicine if lifestyle changes are not enough to get your blood pressure under control, and if:  Your systolic blood pressure is above 130.  Your diastolic blood pressure is above 80. Your personal target blood pressure may vary depending on your medical conditions, your age, and other factors. Follow these instructions at home: Eating and drinking   Eat a diet that is high in fiber and potassium, and low in sodium, added sugar, and fat. An example eating plan is called the DASH (Dietary Approaches to Stop  Hypertension) diet. To eat this way: ? Eat plenty of fresh fruits and vegetables. Try to fill one half of your plate at each meal with fruits and vegetables. ? Eat whole grains, such as whole-wheat pasta, brown rice, or whole-grain bread. Fill about one fourth of your plate with whole grains. ? Eat or drink low-fat dairy products, such as skim milk or low-fat yogurt. ? Avoid fatty cuts of meat, processed or cured meats, and poultry with skin. Fill about one fourth of your plate with lean proteins, such as fish, chicken without skin, beans, eggs, or tofu. ? Avoid pre-made and processed foods. These tend to be higher in sodium, added sugar, and fat.  Reduce your daily sodium intake. Most people with hypertension should eat less than 1,500 mg of sodium a day.  Do not drink alcohol if: ? Your health care provider tells you not to drink. ? You are pregnant, may be pregnant, or are planning to become pregnant.  If you drink alcohol: ? Limit how much you use to:  0-1 drink a day for women.  0-2 drinks a day for men. ? Be aware of how much alcohol is in your drink. In the [place] one drink equals one 12 oz bottle of beer (355 mL), one 5 oz glass of wine (148 mL), or one 1 oz glass of hard liquor (44 mL). Lifestyle   Work with your health care provider to maintain a healthy body weight or to lose weight. Ask what an ideal weight is for  you.  Get at least 30 minutes of exercise most days of the week. Activities may include walking, swimming, or biking.  Include exercise to strengthen your muscles (resistance exercise), such as Pilates or lifting weights, as part of your weekly exercise routine. Try to do these types of exercises for 30 minutes at least 3 days a week.  Do not use any products that contain nicotine or tobacco, such as cigarettes, e-cigarettes, and chewing tobacco. If you need help quitting, ask your health care provider.  Monitor your blood pressure at home as told by your health care provider.  Keep all follow-up visits as told by your health care provider. This is important. Medicines  Take over-the-counter and prescription medicines only as told by your health care provider. Follow directions carefully. Blood pressure medicines must be taken as prescribed.  Do not skip doses of blood pressure medicine. Doing this puts you at risk for problems and can make the medicine less effective.  Ask your health care provider about side effects or reactions to medicines that you should watch for. Contact a health care provider if you:  Think you are having a reaction to a medicine you are taking.  Have headaches that keep coming back (recurring).  Feel dizzy.  Have swelling in your ankles.  Have trouble with your vision. Get help Andrew [name] away if you:  Develop a severe headache or confusion.  Have unusual weakness or numbness.  Feel faint.  Have severe pain in your chest or abdomen.  Vomit repeatedly.  Have trouble breathing. Summary  Hypertension is when the force of blood pumping through your arteries is too strong. If this condition is not controlled, it may put you at risk for serious complications.  Your personal target blood pressure may vary depending on your medical conditions, your age, and other factors. For most people, a normal blood pressure is less than 120/80.  Hypertension is  treated with lifestyle changes, medicines, or a combination of  both. Lifestyle changes include losing weight, eating a healthy, low-sodium diet, exercising more, and limiting alcohol. This information is not intended to replace advice given to you by your health care provider. Make sure you discuss any questions you have with your health care provider. Document Revised: 07/09/2018 Document Reviewed: 07/09/2018 Elsevier Patient Education  2020 Elsevier Inc.      Agustina Caroli, MD [facility]

## 2020-03-29 NOTE — Progress Notes (Signed)
153/68 

## 2020-03-29 NOTE — Patient Instructions (Addendum)
If you have lab work done today you will be contacted with your lab results within the next 2 weeks.  If you have not heard from [place] then please contact us. The fastest way to get your results is to register for My Chart.   IF you received an x-ray today, you will receive an invoice from [facility]. Please contact [facility] at [phone] with questions or concerns regarding your invoice.   IF you received labwork today, you will receive an invoice from [place]. Please contact [facility] at [phone] with questions or concerns regarding your invoice.   Our billing staff will not be able to assist you with questions regarding bills from these companies.  You will be contacted with the lab results as soon as they are available. The fastest way to get your results is to activate your My Chart account. Instructions are located on the last page of this paperwork. If you have not heard from [place] regarding the results in 2 weeks, please contact this office.      Health Maintenance After Age 36 After age 63, you are at a higher risk for certain long-term diseases and infections as well as injuries from falls. Falls are a major cause of broken bones and head injuries in people who are older than age 25. Getting regular preventive care can help to keep you healthy and well. Preventive care includes getting regular testing and making lifestyle changes as recommended by your health care provider. Talk with your health care provider about:  Which screenings and tests you should have. A screening is a test that checks for a disease when you have no symptoms.  A diet and exercise plan that is right for you. What should I know about screenings and tests to prevent falls? Screening and testing are the best ways to find a health problem early. Early diagnosis and treatment give you the best chance of managing medical conditions that are common after age 57. Certain conditions and  lifestyle choices may make you more likely to have a fall. Your health care provider may recommend:  Regular vision checks. Poor vision and conditions such as cataracts can make you more likely to have a fall. If you wear glasses, make sure to get your prescription updated if your vision changes.  Medicine review. Work with your health care provider to regularly review all of the medicines you are taking, including over-the-counter medicines. Ask your health care provider about any side effects that may make you more likely to have a fall. Tell your health care provider if any medicines that you take make you feel dizzy or sleepy.  Osteoporosis screening. Osteoporosis is a condition that causes the bones to get weaker. This can make the bones weak and cause them to break more easily.  Blood pressure screening. Blood pressure changes and medicines to control blood pressure can make you feel dizzy.  Strength and balance checks. Your health care provider may recommend certain tests to check your strength and balance while standing, walking, or changing positions.  Foot health exam. Foot pain and numbness, as well as not wearing proper footwear, can make you more likely to have a fall.  Depression screening. You may be more likely to have a fall if you have a fear of falling, feel emotionally low, or feel unable to do activities that you used to do.  Alcohol use screening. Using too much alcohol can affect your balance and may make you more likely  have a fall. What actions can I take to lower my risk of falls? General instructions  Talk with your health care provider about your risks for falling. Tell your health care provider if: ? You fall. Be sure to tell your health care provider about all falls, even ones that seem minor. ? You feel dizzy, sleepy, or off-balance.  Take over-the-counter and prescription medicines only as told by your health care provider. These include any  supplements.  Eat a healthy diet and maintain a healthy weight. A healthy diet includes low-fat dairy products, low-fat (lean) meats, and fiber from whole grains, beans, and lots of fruits and vegetables. Home safety  Remove any tripping hazards, such as rugs, cords, and clutter.  Install safety equipment such as grab bars in bathrooms and safety rails on stairs.  Keep rooms and walkways well-lit. Activity   Follow a regular exercise program to stay fit. This will help you maintain your balance. Ask your health care provider what types of exercise are appropriate for you.  If you need a cane or walker, use it as recommended by your health care provider.  Wear supportive shoes that have nonskid soles. Lifestyle  Do not drink alcohol if your health care provider tells you not to drink.  If you drink alcohol, limit how much you have: ? 0-1 drink a day for women. ? 0-2 drinks a day for men.  Be aware of how much alcohol is in your drink. In the [place] one drink equals one typical bottle of beer (12 oz), one-half glass of wine (5 oz), or one shot of hard liquor (1 oz).  Do not use any products that contain nicotine or tobacco, such as cigarettes and e-cigarettes. If you need help quitting, ask your health care provider. Summary  Having a healthy lifestyle and getting preventive care can help to protect your health and wellness after age 65.  Screening and testing are the best way to find a health problem early and help you avoid having a fall. Early diagnosis and treatment give you the best chance for managing medical conditions that are more common for people who are older than age 65.  Falls are a major cause of broken bones and head injuries in people who are older than age 65. Take precautions to prevent a fall at home.  Work with your health care provider to learn what changes you can make to improve your health and wellness and to prevent falls. This information is not intended  to replace advice given to you by your health care provider. Make sure you discuss any questions you have with your health care provider. Document Revised: 02/19/2019 Document Reviewed: 09/11/2017 Elsevier Patient Education  2020 Else[employer] Inc[employer]  Hypertension, Adult High blood pressure (hypertension) is when the force of blood pumping through the arteries is too strong. The arteries are the blood vessels that carry blood from the heart throughout the body. Hypertension forces the heart to work harder to pump blood and may cause arteries to become narrow or stiff. Untreated or uncontrolled hypertension can cause a heart attack, heart failure, a stroke, kidney disease, and other problems. A blood pressure reading consists of a higher number over a lower number. Ideally, your blood pressure should be below 120/80. The first ("top") number is called the systolic pressure. It is a measure of the pressure in your arteries as your heart beats. The second ("bottom") number is called the diastolic pressure. It is a measure of the pressure in   your arteries as the heart relaxes. What are the causes? The exact cause of this condition is not known. There are some conditions that result in or are related to high blood pressure. What increases the risk? Some risk factors for high blood pressure are under your control. The following factors may make you more likely to develop this condition:  Smoking.  Having type 2 diabetes mellitus, high cholesterol, or both.  Not getting enough exercise or physical activity.  Being overweight.  Having too much fat, sugar, calories, or salt (sodium) in your diet.  Drinking too much alcohol. Some risk factors for high blood pressure may be difficult or impossible to change. Some of these factors include:  Having chronic kidney disease.  Having a family history of high blood pressure.  Age. Risk increases with age.  Race. You may be at higher risk if you are African  American.  Gender. Men are at higher risk than women before age 45. After age 65, women are at higher risk than men.  Having obstructive sleep apnea.  Stress. What are the signs or symptoms? High blood pressure may not cause symptoms. Very high blood pressure (hypertensive crisis) may cause:  Headache.  Anxiety.  Shortness of breath.  Nosebleed.  Nausea and vomiting.  Vision changes.  Severe chest pain.  Seizures. How is this diagnosed? This condition is diagnosed by measuring your blood pressure while you are seated, with your arm resting on a flat surface, your legs uncrossed, and your feet flat on the floor. The cuff of the blood pressure monitor will be placed directly against the skin of your upper arm at the level of your heart. It should be measured at least twice using the same arm. Certain conditions can cause a difference in blood pressure between your right and left arms. Certain factors can cause blood pressure readings to be lower or higher than normal for a short period of time:  When your blood pressure is higher when you are in a health care provider's office than when you are at home, this is called white coat hypertension. Most people with this condition do not need medicines.  When your blood pressure is higher at home than when you are in a health care provider's office, this is called masked hypertension. Most people with this condition may need medicines to control blood pressure. If you have a high blood pressure reading during one visit or you have normal blood pressure with other risk factors, you may be asked to:  Return on a different day to have your blood pressure checked again.  Monitor your blood pressure at home for 1 week or longer. If you are diagnosed with hypertension, you may have other blood or imaging tests to help your health care provider understand your overall risk for other conditions. How is this treated? This condition is treated by  making healthy lifestyle changes, such as eating healthy foods, exercising more, and reducing your alcohol intake. Your health care provider may prescribe medicine if lifestyle changes are not enough to get your blood pressure under control, and if:  Your systolic blood pressure is above 130.  Your diastolic blood pressure is above 80. Your personal target blood pressure may vary depending on your medical conditions, your age, and other factors. Follow these instructions at home: Eating and drinking   Eat a diet that is high in fiber and potassium, and low in sodium, added sugar, and fat. An example eating plan is called the DASH (  Dietary Approaches to Stop Hypertension) diet. To eat this way: ? Eat plenty of fresh fruits and vegetables. Try to fill one half of your plate at each meal with fruits and vegetables. ? Eat whole grains, such as whole-wheat pasta, brown rice, or whole-grain bread. Fill about one fourth of your plate with whole grains. ? Eat or drink low-fat dairy products, such as skim milk or low-fat yogurt. ? Avoid fatty cuts of meat, processed or cured meats, and poultry with skin. Fill about one fourth of your plate with lean proteins, such as fish, chicken without skin, beans, eggs, or tofu. ? Avoid pre-made and processed foods. These tend to be higher in sodium, added sugar, and fat.  Reduce your daily sodium intake. Most people with hypertension should eat less than 1,500 mg of sodium a day.  Do not drink alcohol if: ? Your health care provider tells you not to drink. ? You are pregnant, may be pregnant, or are planning to become pregnant.  If you drink alcohol: ? Limit how much you use to:  0-1 drink a day for women.  0-2 drinks a day for men. ? Be aware of how much alcohol is in your drink. In the [place] one drink equals one 12 oz bottle of beer (355 mL), one 5 oz glass of wine (148 mL), or one 1 oz glass of hard liquor (44 mL). Lifestyle   Work with your health  care provider to maintain a healthy body weight or to lose weight. Ask what an ideal weight is for you.  Get at least 30 minutes of exercise most days of the week. Activities may include walking, swimming, or biking.  Include exercise to strengthen your muscles (resistance exercise), such as Pilates or lifting weights, as part of your weekly exercise routine. Try to do these types of exercises for 30 minutes at least 3 days a week.  Do not use any products that contain nicotine or tobacco, such as cigarettes, e-cigarettes, and chewing tobacco. If you need help quitting, ask your health care provider.  Monitor your blood pressure at home as told by your health care provider.  Keep all follow-up visits as told by your health care provider. This is important. Medicines  Take over-the-counter and prescription medicines only as told by your health care provider. Follow directions carefully. Blood pressure medicines must be taken as prescribed.  Do not skip doses of blood pressure medicine. Doing this puts you at risk for problems and can make the medicine less effective.  Ask your health care provider about side effects or reactions to medicines that you should watch for. Contact a health care provider if you:  Think you are having a reaction to a medicine you are taking.  Have headaches that keep coming back (recurring).  Feel dizzy.  Have swelling in your ankles.  Have trouble with your vision. Get help right away if you:  Develop a severe headache or confusion.  Have unusual weakness or numbness.  Feel faint.  Have severe pain in your chest or abdomen.  Vomit repeatedly.  Have trouble breathing. Summary  Hypertension is when the force of blood pumping through your arteries is too strong. If this condition is not controlled, it may put you at risk for serious complications.  Your personal target blood pressure may vary depending on your medical conditions, your age, and  other factors. For most people, a normal blood pressure is less than 120/80.  Hypertension is treated with lifestyle changes, medicines,   or a combination of both. Lifestyle changes include losing weight, eating a healthy, low-sodium diet, exercising more, and limiting alcohol. This information is not intended to replace advice given to you by your health care provider. Make sure you discuss any questions you have with your health care provider. Document Revised: 07/09/2018 Document Reviewed: 07/09/2018 Elsevier Patient Education  2020 Elsevier Inc.  

## 2020-03-30 ENCOUNTER — Telehealth: Payer: Self-pay | Admitting: *Deleted

## 2020-03-30 NOTE — Telephone Encounter (Signed)
Faxed Cologuard requisition to [facility]. Confirmation page 5:03 pm.

## 2020-04-01 ENCOUNTER — Ambulatory Visit: Payer: Medicare HMO | Admitting: Family Medicine

## 2020-04-12 ENCOUNTER — Other Ambulatory Visit: Payer: Self-pay | Admitting: Family Medicine

## 2020-04-12 DIAGNOSIS — I10 Essential (primary) hypertension: Secondary | ICD-10-CM

## 2020-05-05 ENCOUNTER — Other Ambulatory Visit: Payer: Self-pay

## 2020-05-05 ENCOUNTER — Ambulatory Visit (INDEPENDENT_AMBULATORY_CARE_PROVIDER_SITE_OTHER): Payer: Medicare HMO | Admitting: Emergency Medicine

## 2020-05-05 ENCOUNTER — Encounter: Payer: Self-pay | Admitting: Emergency Medicine

## 2020-05-05 VITALS — BP 120/61 | HR 88 | Temp 97.6°F | Ht 73.0 in | Wt 191.8 lb

## 2020-05-05 DIAGNOSIS — I10 Essential (primary) hypertension: Secondary | ICD-10-CM

## 2020-05-05 DIAGNOSIS — N1831 Chronic kidney disease, stage 3a: Secondary | ICD-10-CM | POA: Diagnosis not present

## 2020-05-05 DIAGNOSIS — I700 Atherosclerosis of aorta: Secondary | ICD-10-CM | POA: Diagnosis not present

## 2020-05-05 DIAGNOSIS — D6869 Other thrombophilia: Secondary | ICD-10-CM | POA: Diagnosis not present

## 2020-05-05 DIAGNOSIS — J449 Chronic obstructive pulmonary disease, unspecified: Secondary | ICD-10-CM

## 2020-05-05 DIAGNOSIS — R7303 Prediabetes: Secondary | ICD-10-CM | POA: Diagnosis not present

## 2020-05-05 DIAGNOSIS — C61 Malignant neoplasm of prostate: Secondary | ICD-10-CM

## 2020-05-05 DIAGNOSIS — I2510 Atherosclerotic heart disease of native coronary artery without angina pectoris: Secondary | ICD-10-CM

## 2020-05-05 DIAGNOSIS — J301 Allergic rhinitis due to pollen: Secondary | ICD-10-CM | POA: Diagnosis not present

## 2020-05-05 DIAGNOSIS — F17200 Nicotine dependence, unspecified, uncomplicated: Secondary | ICD-10-CM | POA: Diagnosis not present

## 2020-05-05 DIAGNOSIS — F17209 Nicotine dependence, unspecified, with unspecified nicotine-induced disorders: Secondary | ICD-10-CM

## 2020-05-05 MED ORDER — CETIRIZINE HCL 10 MG PO TABS: ORAL_TABLET | ORAL | 4 refills | Status: DC

## 2020-05-05 MED ORDER — CHANTIX STARTING MONTH PAK 0.5 MG X 11 & 1 MG X 42 PO TABS: ORAL_TABLET | ORAL | 0 refills | Status: DC

## 2020-05-05 NOTE — Patient Instructions (Addendum)
   If you have lab work done today you will be contacted with your lab results within the next 2 weeks.  If you have not heard from us then please contact us. The fastest way to get your results is to register for My Chart.   IF you received an x-ray today, you will receive an invoice from [facility]. Please contact [facility] at [phone] with questions or concerns regarding your invoice.   IF you received labwork today, you will receive an invoice from [facility]. Please contact [facility] at [phone] with questions or concerns regarding your invoice.   Our billing staff will not be able to assist you with questions regarding bills from these companies.  You will be contacted with the lab results as soon as they are available. The fastest way to get your results is to activate your My Chart account. Instructions are located on the last page of this paperwork. If you have not heard from us regarding the results in 2 weeks, please contact this office.     Coping with Quitting Smoking  Quitting smoking is a physical and mental challenge. You will face cravings, withdrawal symptoms, and temptation. Before quitting, work with your health care provider to make a plan that can help you cope. Preparation can help you quit and keep you from giving in. How can I cope with cravings? Cravings usually last for 5-10 minutes. If you get through it, the craving will pass. Consider taking the following actions to help you cope with cravings:  Keep your mouth busy: ? Chew sugar-free gum. ? Suck on hard candies or a straw. ? Brush your teeth.  Keep your hands and body busy: ? Immediately change to a different activity when you feel a craving. ? Squeeze or play with a ball. ? Do an activity or a hobby, like making bead jewelry, practicing needlepoint, or working with wood. ? Mix up your normal routine. ? Take a short exercise break. Go for a quick walk or run up and down  stairs. ? Spend time in public places where smoking is not allowed.  Focus on doing something kind or helpful for someone else.  Call a friend or family member to talk during a craving.  Join a support group.  Call a quit line, such as 1-800-QUIT-NOW.  Talk with your health care provider about medicines that might help you cope with cravings and make quitting easier for you. How can I deal with withdrawal symptoms? Your body may experience negative effects as it tries to get used to not having nicotine in the system. These effects are called withdrawal symptoms. They may include:  Feeling hungrier than normal.  Trouble concentrating.  Irritability.  Trouble sleeping.  Feeling depressed.  Restlessness and agitation.  Craving a cigarette. To manage withdrawal symptoms:  Avoid places, people, and activities that trigger your cravings.  Remember why you want to quit.  Get plenty of sleep.  Avoid coffee and other caffeinated drinks. These may worsen some of your symptoms. How can I handle social situations? Social situations can be difficult when you are quitting smoking, especially in the first few weeks. To manage this, you can:  Avoid parties, bars, and other social situations where people might be smoking.  Avoid alcohol.  Leave right away if you have the urge to smoke.  Explain to your family and friends that you are quitting smoking. Ask for understanding and support.  Plan activities with friends or family where smoking is   not an option. What are some ways I can cope with stress? Wanting to smoke may cause stress, and stress can make you want to smoke. Find ways to manage your stress. Relaxation techniques can help. For example:  Breathe slowly and deeply, in through your nose and out through your mouth.  Listen to soothing, relaxing music.  Talk with a family member or friend about your stress.  Light a candle.  Soak in a bath or take a shower.  Think  about a peaceful place. What are some ways I can prevent weight gain? Be aware that many people gain weight after they quit smoking. However, not everyone does. To keep from gaining weight, have a plan in place before you quit and stick to the plan after you quit. Your plan should include:  Having healthy snacks. When you have a craving, it may help to: ? Eat plain popcorn, crunchy carrots, celery, or other cut vegetables. ? Chew sugar-free gum.  Changing how you eat: ? Eat small portion sizes at meals. ? Eat 4-6 small meals throughout the day instead of 1-2 large meals a day. ? Be mindful when you eat. Do not watch television or do other things that might distract you as you eat.  Exercising regularly: ? Make time to exercise each day. If you do not have time for a long workout, do short bouts of exercise for 5-10 minutes several times a day. ? Do some form of strengthening exercise, like weight lifting, and some form of aerobic exercise, like running or swimming.  Drinking plenty of water or other low-calorie or no-calorie drinks. Drink 6-8 glasses of water daily, or as much as instructed by your health care provider. Summary  Quitting smoking is a physical and mental challenge. You will face cravings, withdrawal symptoms, and temptation to smoke again. Preparation can help you as you go through these challenges.  You can cope with cravings by keeping your mouth busy (such as by chewing gum), keeping your body and hands busy, and making calls to family, friends, or a helpline for people who want to quit smoking.  You can cope with withdrawal symptoms by avoiding places where people smoke, avoiding drinks with caffeine, and getting plenty of rest.  Ask your health care provider about the different ways to prevent weight gain, avoid stress, and handle social situations. This information is not intended to replace advice given to you by your health care provider. Make sure you discuss any  questions you have with your health care provider. Document Revised: 10/11/2017 Document Reviewed: 10/26/2016 Elsevier Patient Education  2020 [employer].  

## 2020-05-05 NOTE — Progress Notes (Signed)
Andrew Andrew [name] 69 y.o.   Chief Complaint  Patient presents with  . Transitions Of Care  . Andrew [name] hip    feeling better   . quit smoking    HISTORY OF PRESENT ILLNESS: This is a 69 y.o. male here for follow-up of Andrew [name] hip pain.  Saw me last May.  Doing much better.  Pain-free. Patient has several chronic medical problems: 1.  Hypertension: On Benicar 40 mg daily 2.  COPD: On Advair and albuterol as needed.  Smokes 1 to 2 cigarettes/day.  Cutting back on the smoking and wishes to try medication. 3.  Chronic kidney disease: Saw nephrologist last week and had blood work done.  Told his kidneys are stable.  Stage III. 4.  Peripheral vascular disease 5.  Dyslipidemia: On pravastatin 10 mg daily 6.  Prediabetes 7.  History of prostate cancer: Stable.  Saw urologist last year.   HPI   Prior to Admission medications   Medication Sig Start Date End Date Taking? Authorizing Provider  albuterol (PROVENTIL HFA;VENTOLIN HFA) 108 (90 Base) MCG/ACT inhaler Inhale 2 puffs into the lungs every 4 (four) hours as needed for wheezing or shortness of breath (cough, shortness of breath or wheezing.). 06/29/17  Yes Shawnee Knapp, MD  albuterol (PROVENTIL) (2.5 MG/3ML) 0.083% nebulizer solution Take 3 mLs (2.5 mg total) by nebulization every 6 (six) hours as needed for wheezing or shortness of breath. 03/29/20  Yes Oyuki Hogan, Ines Bloomer, MD  cetirizine (ZYRTEC) 10 MG tablet TAKE 1 TABLET (10 MG TOTAL) BY MOUTH DAILY. 01/04/20  Yes Forrest Moron, MD  Cholecalciferol (VITAMIN D3) 50 MCG (2000 UT) capsule Take 1 capsule (2,000 Units total) by mouth daily. 10/02/19  Yes Forrest Moron, MD  olmesartan (BENICAR) 40 MG tablet TAKE 1 TABLET BY MOUTH EVERY DAY 04/12/20  Yes Yvan Dority, Ines Bloomer, MD  pravastatin (PRAVACHOL) 10 MG tablet Take 1 tablet (10 mg total) by mouth daily. 11/04/19  Yes Stallings, Zoe A, MD  triamcinolone cream (KENALOG) 0.1 % Apply 1 application topically 2 (two) times daily. 03/29/20  Yes  Zierra Laroque, Ines Bloomer, MD  DILT-XR 240 MG 24 hr capsule TAKE 1 CAPSULE BY MOUTH EVERY DAY Patient not taking: Reported on 05/05/2020 02/20/20   Forrest Moron, MD    Allergies  Allergen Reactions  . Lisinopril Cough  . Lisinopril-Hydrochlorothiazide     dizziness    Patient Active Problem List   Diagnosis Date Noted  . Prostate cancer ([place]) 01/14/2018  . Carotid artery stenosis, asymptomatic, bilateral 12/06/2017  . Vitamin D deficiency 12/03/2017  . Prediabetes 11/14/2017  . Chronic kidney disease (CKD) stage G3a/A2, moderately decreased glomerular filtration rate (GFR) between 45-59 mL/min/1.73 square meter and albuminuria creatinine ratio between 30-299 mg/g 11/13/2017  . Cigarette smoker 08/20/2017  . COPD GOLD 2 still smoking  07/31/2017  . Coronary atherosclerosis of native coronary artery 07/31/2017  . BPH (benign prostatic hyperplasia) 05/30/2016  . DENTAL CARIES 06/01/2008  . GERD 03/04/2008  . Hyperlipidemia LDL goal <70 01/30/2008  . Essential hypertension 01/30/2008  . PERIPHERAL VASCULAR DISEASE 01/30/2008    Past Medical History:  Diagnosis Date  . Asthma   . Carotid artery stenosis, asymptomatic, bilateral 12/06/2017   Consider recheck Dopplers in 6 months -> 05/2018 Mild to moderate bilateral carotid artery stenosis Carotid Dopplers done at [facility] 11/26/2017 show left internal carotid artery stenosis 50-69% with soft plaque.  Mild stenosis in the left common carotid artery <50%. Mild stenosis in the Andrew [name] common carotid artery <  50% with moderate diffuse soft plaque.  Mild stenosis in the Andrew [name] e  . COCAINE ABUSE 01/30/2008   Qualifier: Diagnosis of  By: Amil Amen MD, Benjamine Mola    . COPD (chronic obstructive pulmonary disease) ([place])   . Hypertension     History reviewed. No pertinent surgical history.  Social History   Socioeconomic History  . Marital status: Legally Separated    Spouse name: Not on file  . Number of children: 1  . Years of  education: Not on file  . Highest education level: Not on file  Occupational History  . Not on file  Tobacco Use  . Smoking status: Current Some Day Smoker    Packs/day: 0.75    Years: 44.00    Pack years: 33.00    Types: Cigarettes  . Smokeless tobacco: Never Used  . Tobacco comment: 08/04 pack lasts pt for a week (3-4 cigs/daily)  Vaping Use  . Vaping Use: Never used  Substance and Sexual Activity  . Alcohol use: Yes    Comment: occasional  . Drug use: No  . Sexual activity: Not on file  Other Topics Concern  . Not on file  Social History Narrative  . Not on file   Social Determinants of Health   Financial Resource Strain:   . Difficulty of Paying Living Expenses:   Food Insecurity:   . Worried About Charity fundraiser in the Last Year:   . Arboriculturist in the Last Year:   Transportation Needs:   . Film/video editor (Medical):   Marland Kitchen Lack of Transportation (Non-Medical):   Physical Activity:   . Days of Exercise per Week:   . Minutes of Exercise per Session:   Stress:   . Feeling of Stress :   Social Connections:   . Frequency of Communication with Friends and Family:   . Frequency of Social Gatherings with Friends and Family:   . Attends Religious Services:   . Active Member of Clubs or Organizations:   . Attends Archivist Meetings:   Marland Kitchen Marital Status:   Intimate Partner Violence:   . Fear of Current or Ex-Partner:   . Emotionally Abused:   Marland Kitchen Physically Abused:   . Sexually Abused:     History reviewed. No pertinent family history.   Review of Systems  Constitutional: Negative.  Negative for chills and fever.  HENT: Negative.  Negative for congestion and sore throat.   Respiratory: Negative.  Negative for cough and shortness of breath.   Cardiovascular: Negative.  Negative for chest pain and palpitations.  Gastrointestinal: Negative.  Negative for abdominal pain, diarrhea, nausea and vomiting.  Genitourinary: Negative.  Negative for  dysuria and hematuria.  Musculoskeletal: Negative for back pain, myalgias and neck pain.  Skin: Negative.  Negative for rash.  Neurological: Negative.  Negative for dizziness and headaches.  All other systems reviewed and are negative.  Today's Vitals   05/05/20 1420  BP: 120/61  Pulse: 88  Temp: 97.6 F (36.4 C)  TempSrc: Temporal  SpO2: 96%  Weight: 191 lb 12.8 oz (87 kg)  Height: 6\' 1"  (1.854 m)   Body mass index is 25.3 kg/m.   Physical Exam Vitals reviewed.  Constitutional:      Appearance: Normal appearance.  HENT:     Head: Normocephalic.  Eyes:     Extraocular Movements: Extraocular movements intact.     Pupils: Pupils are equal, round, and reactive to light.  Cardiovascular:     Rate  and Rhythm: Normal rate and regular rhythm.     Pulses: Normal pulses.     Heart sounds: Normal heart sounds.  Pulmonary:     Effort: Pulmonary effort is normal.     Breath sounds: Normal breath sounds.  Abdominal:     General: There is no distension.     Palpations: Abdomen is soft.     Tenderness: There is no abdominal tenderness.  Musculoskeletal:        General: Normal range of motion.     Cervical back: Normal range of motion and neck supple.     Andrew [name] lower leg: No edema.     Left lower leg: No edema.     Comments: Varicose veins lower extremities  Skin:    General: Skin is warm and dry.     Capillary Refill: Capillary refill takes less than 2 seconds.  Neurological:     General: No focal deficit present.     Mental Status: He is alert and oriented to person, place, and time.  Psychiatric:        Mood and Affect: Mood normal.        Behavior: Behavior normal.    A total of 30 minutes was spent with the patient, greater than 50% of which was in counseling/coordination of care regarding multiple chronic medical problems and cardiovascular risks associated with these, smoking and cardiovascular risks associated with this, smoking cessation treatments, review of all  medications, review of most recent office visit notes, review of most recent blood work results, prognosis and need for follow-up.   ASSESSMENT & PLAN: Wallie was seen today for transitions of care, Andrew [name] hip and quit smoking.  Diagnoses and all orders for this visit:  Current smoker -     varenicline (CHANTIX STARTING MONTH PAK) 0.5 MG X 11 & 1 MG X 42 tablet; Take one 0.5 mg tablet by mouth once daily for 3 days, then increase to one 0.5 mg tablet twice daily for 4 days, then increase to one 1 mg tablet twice daily.  Seasonal allergic rhinitis due to pollen -     cetirizine (ZYRTEC) 10 MG tablet; TAKE 1 TABLET (10 MG TOTAL) BY MOUTH DAILY.  Prediabetes  Essential hypertension  COPD GOLD 2 still smoking   Chronic kidney disease (CKD) stage G3a/A2, moderately decreased glomerular filtration rate (GFR) between 45-59 mL/min/1.73 square meter and albuminuria creatinine ratio between 30-299 mg/g  Atherosclerosis of native coronary artery of native heart without angina pectoris  Aortic atherosclerosis (HCC)  Acquired thrombophilia (HCC)  Prostate cancer (HCC)  Tobacco use disorder, continuous    Patient Instructions       If you have lab work done today you will be contacted with your lab results within the next 2 weeks.  If you have not heard from [place] then please contact us. The fastest way to get your results is to register for My Chart.   IF you received an x-ray today, you will receive an invoice from [facility]. Please contact [facility] at [phone] with questions or concerns regarding your invoice.   IF you received labwork today, you will receive an invoice from [facility]. Please contact [facility] at [phone] with questions or concerns regarding your invoice.   Our billing staff will not be able to assist you with questions regarding bills from these companies.  You will be contacted with the lab results as soon as they are available. The  fastest way to get your results is to activate your  My Chart account. Instructions are located on the last page of this paperwork. If you have not heard from [place] regarding the results in 2 weeks, please contact this office.      Coping with Quitting Smoking  Quitting smoking is a physical and mental challenge. You will face cravings, withdrawal symptoms, and temptation. Before quitting, work with your health care provider to make a plan that can help you cope. Preparation can help you quit and keep you from giving in. How can I cope with cravings? Cravings usually last for 5-10 minutes. If you get through it, the craving will pass. Consider taking the following actions to help you cope with cravings:  Keep your mouth busy: ? Chew sugar-free gum. ? Suck on hard candies or a straw. ? Brush your teeth.  Keep your hands and body busy: ? Immediately change to a different activity when you feel a craving. ? Squeeze or play with a ball. ? Do an activity or a hobby, like making bead jewelry, practicing needlepoint, or working with wood. ? Mix up your normal routine. ? Take a short exercise break. Go for a quick walk or run up and down stairs. ? Spend time in public places where smoking is not allowed.  Focus on doing something kind or helpful for someone else.  Call a friend or family member to talk during a craving.  Join a support group.  Call a quit line, such as 1-800-QUIT-NOW.  Talk with your health care provider about medicines that might help you cope with cravings and make quitting easier for you. How can I deal with withdrawal symptoms? Your body may experience negative effects as it tries to get used to not having nicotine in the system. These effects are called withdrawal symptoms. They may include:  Feeling hungrier than normal.  Trouble concentrating.  Irritability.  Trouble sleeping.  Feeling depressed.  Restlessness and agitation.  Craving a cigarette. To manage  withdrawal symptoms:  Avoid places, people, and activities that trigger your cravings.  Remember why you want to quit.  Get plenty of sleep.  Avoid coffee and other caffeinated drinks. These may worsen some of your symptoms. How can I handle social situations? Social situations can be difficult when you are quitting smoking, especially in the first few weeks. To manage this, you can:  Avoid parties, bars, and other social situations where people might be smoking.  Avoid alcohol.  Leave Andrew [name] away if you have the urge to smoke.  Explain to your family and friends that you are quitting smoking. Ask for understanding and support.  Plan activities with friends or family where smoking is not an option. What are some ways I can cope with stress? Wanting to smoke may cause stress, and stress can make you want to smoke. Find ways to manage your stress. Relaxation techniques can help. For example:  Breathe slowly and deeply, in through your nose and out through your mouth.  Listen to soothing, relaxing music.  Talk with a family member or friend about your stress.  Light a candle.  Soak in a bath or take a shower.  Think about a peaceful place. What are some ways I can prevent weight gain? Be aware that many people gain weight after they quit smoking. However, not everyone does. To keep from gaining weight, have a plan in place before you quit and stick to the plan after you quit. Your plan should include:  Having healthy snacks. When you have a craving, it  may help to: ? Eat plain popcorn, crunchy carrots, celery, or other cut vegetables. ? Chew sugar-free gum.  Changing how you eat: ? Eat small portion sizes at meals. ? Eat 4-6 small meals throughout the day instead of 1-2 large meals a day. ? Be mindful when you eat. Do not watch television or do other things that might distract you as you eat.  Exercising regularly: ? Make time to exercise each day. If you do not have time  for a long workout, do short bouts of exercise for 5-10 minutes several times a day. ? Do some form of strengthening exercise, like weight lifting, and some form of aerobic exercise, like running or swimming.  Drinking plenty of water or other low-calorie or no-calorie drinks. Drink 6-8 glasses of water daily, or as much as instructed by your health care provider. Summary  Quitting smoking is a physical and mental challenge. You will face cravings, withdrawal symptoms, and temptation to smoke again. Preparation can help you as you go through these challenges.  You can cope with cravings by keeping your mouth busy (such as by chewing gum), keeping your body and hands busy, and making calls to family, friends, or a helpline for people who want to quit smoking.  You can cope with withdrawal symptoms by avoiding places where people smoke, avoiding drinks with caffeine, and getting plenty of rest.  Ask your health care provider about the different ways to prevent weight gain, avoid stress, and handle social situations. This information is not intended to replace advice given to you by your health care provider. Make sure you discuss any questions you have with your health care provider. Document Revised: 10/11/2017 Document Reviewed: 10/26/2016 Elsevier Patient Education  2020 Elsevier Inc.      Agustina Caroli, MD [facility]

## 2020-05-19 ENCOUNTER — Encounter: Payer: Self-pay | Admitting: Internal Medicine

## 2020-05-27 ENCOUNTER — Other Ambulatory Visit: Payer: Self-pay

## 2020-05-27 ENCOUNTER — Ambulatory Visit
Admission: RE | Admit: 2020-05-27 | Discharge: 2020-05-27 | Disposition: A | Discharge status: Home / Self Care | Payer: Medicare HMO | Source: Ambulatory Visit | Attending: Nephrology | Admitting: Nephrology

## 2020-05-27 DIAGNOSIS — N183 Chronic kidney disease, stage 3 unspecified: Secondary | ICD-10-CM | POA: Diagnosis not present

## 2020-05-27 DIAGNOSIS — N281 Cyst of kidney, acquired: Secondary | ICD-10-CM | POA: Diagnosis not present

## 2020-05-27 DIAGNOSIS — Q600 Renal agenesis, unilateral: Secondary | ICD-10-CM | POA: Diagnosis not present

## 2020-05-27 DIAGNOSIS — N1831 Chronic kidney disease, stage 3a: Secondary | ICD-10-CM

## 2020-05-30 ENCOUNTER — Other Ambulatory Visit: Payer: Self-pay | Admitting: Emergency Medicine

## 2020-05-30 ENCOUNTER — Encounter: Payer: Self-pay | Admitting: Emergency Medicine

## 2020-05-30 NOTE — Telephone Encounter (Signed)
Medication Refill - Medication: DILT-XR 240 MG 24 hr capsule (Patient is completely out of this medication and has requested that all medications on file be transferred to [facility].)   Has the patient contacted their pharmacy? yes (Agent: If no, request that the patient contact the pharmacy for the refill.) (Agent: If yes, when and what did the pharmacy advise?)Contact PCP  Preferred Pharmacy (with phone number or street name):  [place], [place] Phone:  [phone]  Fax:  [phone]       Agent: Please be advised that RX refills may take up to 3 business days. We ask that you follow-up with your pharmacy.

## 2020-05-30 NOTE — Telephone Encounter (Signed)
Requested medication (s) are due for refill today: yes  Requested medication (s) are on the active medication list:yes  Last refill:  Patient reports not taking 05/05/20  Future visit scheduled: yes  Notes to clinic: Patient reported not taking Med is on Medication list    Requested Prescriptions  Pending Prescriptions Disp Refills   diltiazem (DILT-XR) 240 MG 24 hr capsule      Sig: Take by mouth daily.      Cardiovascular:  Calcium Channel Blockers Passed - 05/30/2020  3:54 PM      Passed - Last BP in normal range    BP Readings from Last 1 Encounters:  05/05/20 120/61          Passed - Valid encounter within last 6 months    Recent Outpatient Visits           3 weeks ago Current smoker   Primary Care at [facility], Ines Bloomer, MD   2 months ago Inguinal strain, right, initial encounter   Primary Care at [facility], Ines Bloomer, MD   8 months ago Medicare annual wellness visit, subsequent   Primary Care at [facility], Arlie Solomons, MD   8 months ago Essential hypertension   Primary Care at [facility], Arlie Solomons, MD   1 year ago Essential hypertension   Primary Care at [facility], Arlie Solomons, MD       Future Appointments             In 1 month Wert, Christena Deem, MD [place]   In 4 months Sagardia, Ines Bloomer, MD Primary Care at [place], [facility]

## 2020-05-31 MED ORDER — DILTIAZEM HCL ER 240 MG PO CP24: 240.0000 mg | ORAL_CAPSULE | Freq: Every day | ORAL | 3 refills | Status: DC

## 2020-05-31 NOTE — Telephone Encounter (Signed)
Patient is requesting a refill of the following medications: Requested Prescriptions   Pending Prescriptions Disp Refills   diltiazem (DILT-XR) 240 MG 24 hr capsule      Sig: Take by mouth daily.    Date of patient request: 05/30/20 Last office visit: 05/05/20 Date of last refill: 02/20/2020 Last refill amount:90 Follow up time period per chart: 09/27/20

## 2020-05-31 NOTE — Telephone Encounter (Signed)
This kidney ultrasound was ordered by Dr. Marval Regal, kidney specialist.  He needs to contact his office.  Thanks.

## 2020-06-17 ENCOUNTER — Ambulatory Visit: Payer: Medicare HMO | Admitting: Internal Medicine

## 2020-06-20 ENCOUNTER — Ambulatory Visit: Payer: Medicare HMO | Admitting: Urology

## 2020-06-27 ENCOUNTER — Other Ambulatory Visit: Payer: Self-pay | Admitting: Emergency Medicine

## 2020-06-27 DIAGNOSIS — I10 Essential (primary) hypertension: Secondary | ICD-10-CM

## 2020-06-27 MED ORDER — OLMESARTAN MEDOXOMIL 40 MG PO TABS: 40.0000 mg | ORAL_TABLET | Freq: Every day | ORAL | 0 refills | Status: DC

## 2020-06-27 NOTE — Telephone Encounter (Signed)
Medication: olmesartan (BENICAR) 40 MG tablet [[id number]]  Has the patient contacted their pharmacy? YES (Agent: If no, request that the patient contact the pharmacy for the refill.) (Agent: If yes, when and what did the pharmacy advise?)  Preferred Pharmacy (with phone number or street name): [place], [place]  Phone:  [phone] Fax:  [phone]     Agent: Please be advised that RX refills may take up to 3 business days. We ask that you follow-up with your pharmacy.

## 2020-07-15 ENCOUNTER — Ambulatory Visit: Payer: Medicare HMO | Admitting: Internal Medicine

## 2020-07-26 ENCOUNTER — Encounter: Payer: Self-pay | Admitting: Internal Medicine

## 2020-07-27 ENCOUNTER — Other Ambulatory Visit: Payer: Self-pay

## 2020-07-27 ENCOUNTER — Encounter: Payer: Self-pay | Admitting: Urology

## 2020-07-27 ENCOUNTER — Ambulatory Visit (INDEPENDENT_AMBULATORY_CARE_PROVIDER_SITE_OTHER): Payer: Medicare HMO | Admitting: Urology

## 2020-07-27 VITALS — BP 153/70 | HR 69 | Temp 98.1°F | Ht 74.0 in | Wt 195.0 lb

## 2020-07-27 DIAGNOSIS — R351 Nocturia: Secondary | ICD-10-CM | POA: Diagnosis not present

## 2020-07-27 DIAGNOSIS — C61 Malignant neoplasm of prostate: Secondary | ICD-10-CM | POA: Diagnosis not present

## 2020-07-27 DIAGNOSIS — N401 Enlarged prostate with lower urinary tract symptoms: Secondary | ICD-10-CM

## 2020-07-27 DIAGNOSIS — N138 Other obstructive and reflux uropathy: Secondary | ICD-10-CM

## 2020-07-27 LAB — URINALYSIS, ROUTINE W REFLEX MICROSCOPIC
Bilirubin, UA: NEGATIVE
Glucose, UA: NEGATIVE
Leukocytes,UA: NEGATIVE
Nitrite, UA: NEGATIVE
RBC, UA: NEGATIVE
Specific Gravity, UA: 1.025 (ref 1.005–1.030)
Urobilinogen, Ur: 0.2 mg/dL (ref 0.2–1.0)
pH, UA: 5.5 (ref 5.0–7.5)

## 2020-07-27 LAB — MICROSCOPIC EXAMINATION
Bacteria, UA: NONE SEEN
Epithelial Cells (non renal): NONE SEEN /hpf (ref 0–10)
RBC, Urine: NONE SEEN /hpf (ref 0–2)
Renal Epithel, UA: NONE SEEN /hpf
WBC, UA: NONE SEEN /hpf (ref 0–5)

## 2020-07-27 MED ORDER — TADALAFIL 20 MG PO TABS: 20.0000 mg | ORAL_TABLET | Freq: Every day | ORAL | 5 refills | Status: DC | PRN

## 2020-07-27 MED ORDER — SULFAMETHOXAZOLE-TRIMETHOPRIM 800-160 MG PO TABS: 1.0000 | ORAL_TABLET | Freq: Two times a day (BID) | ORAL | 0 refills | Status: DC

## 2020-07-27 MED ORDER — ALFUZOSIN HCL ER 10 MG PO TB24: 10.0000 mg | ORAL_TABLET | Freq: Every day | ORAL | 11 refills | Status: DC

## 2020-07-27 NOTE — Addendum Note (Signed)
Addended by: Valentina Lucks on: 07/27/2020 11:33 AM   Modules accepted: Orders

## 2020-07-27 NOTE — Progress Notes (Signed)
Urological Symptom Review  Patient is experiencing the following symptoms: Hard to postpone urination Get up at night to urinate Weak stream Erection problems (male only)   Review of Systems  Gastrointestinal (upper)  : Negative for upper GI symptoms  Gastrointestinal (lower) : Negative for lower GI symptoms  Constitutional : Negative for symptoms  Skin: Negative for skin symptoms  Eyes: Negative for eye symptoms  Ear/Nose/Throat : Negative for Ear/Nose/Throat symptoms  Hematologic/Lymphatic: Negative for Hematologic/Lymphatic symptoms  Cardiovascular : Negative for cardiovascular symptoms  Respiratory : Shortness of breath  Endocrine: Negative for endocrine symptoms  Musculoskeletal: Negative for musculoskeletal symptoms  Neurological: Negative for neurological symptoms  Psychologic: Negative for psychiatric symptoms

## 2020-07-27 NOTE — Progress Notes (Signed)
07/27/2020 9:29 AM   Andrew [name] 01-13-1951 [id number]  Referring provider: Horald Pollen, MD [place],  [place] [address]  followup prostate cancer  HPI: Andrew [name] is a 69yo here for followup for prostate cancer. NO recent PSA. He has moderate LUTS on no BPH therapy. He has nocturia 2x. He has urgency but no frequency. Stream fair. He is having intermittent hematospermia. No dysuria or gross hematuria  His records from [facility] are as follows: I have prostate cancer.  HPI: Andrew [name] is a 69 year-old male established patient who is here evaluation for treatment of prostate cancer.  His prostate cancer was diagnosed approximately 12/07/2016. He does have the pathology report from his biopsy. His cancer was diagnosed by [facility]. His PSA at his time of diagnosis was 6.4.   He has not undergone surgery for treatment. He has not undergone External Beam Radiation Therapy for treatment. He has not undergone Hormonal Therapy for treatment.   He does not have urinary incontinence. He does have problems with erectile dysfunction. He has not recently had unwanted weight loss. He is not having pain in new locations.   12/14/2016: Pathology showed Gleason 3+3 in 1/12 cores, 10% of core.   05/31/2017: Pt here for prostate biopsy   06/14/2017: Pathology reveals Gleason 3+3=6 in 1/12 cores 5% of core which is stable in appearance.   12/27/2017: PSA increased to 9.3 in 11/2017. He noted worsening urgency and nocturia 2x during this time   04/11/2018: No recent PSA   07/11/2018: PSA decreased to 6.4   05/21/2019: PSA drawn today. patient here for prostate biopsy   05/29/2019: Biopsy revealed Gleason 3+3=6 in 1/12 cores, 5% of core       PMH: Past Medical History:  Diagnosis Date  . Asthma   . Carotid artery stenosis, asymptomatic, bilateral 12/06/2017   Consider recheck Dopplers in 6 months -> 05/2018 Mild to moderate bilateral carotid artery stenosis Carotid Dopplers done  at [facility] cardiovascular 11/26/2017 show left internal carotid artery stenosis 50-69% with soft plaque.  Mild stenosis in the left common carotid artery <50%. Mild stenosis in the [name] common carotid artery <50% with moderate diffuse soft plaque.  Mild stenosis in the [name] e  . COCAINE ABUSE 01/30/2008   Qualifier: Diagnosis of  By: Amil Amen MD, Benjamine Mola    . COPD (chronic obstructive pulmonary disease) ([place])   . Hypertension     Surgical History: History reviewed. No pertinent surgical history.  Home Medications:  Allergies as of 07/27/2020      Reactions   Lisinopril Cough   Lisinopril-hydrochlorothiazide    dizziness      Medication List       Accurate as of July 27, 2020  9:29 AM. If you have any questions, ask your nurse or doctor.        albuterol 108 (90 Base) MCG/ACT inhaler Commonly known as: VENTOLIN HFA Inhale 2 puffs into the lungs every 4 (four) hours as needed for wheezing or shortness of breath (cough, shortness of breath or wheezing.).   albuterol (2.5 MG/3ML) 0.083% nebulizer solution Commonly known as: PROVENTIL Take 3 mLs (2.5 mg total) by nebulization every 6 (six) hours as needed for wheezing or shortness of breath.   cetirizine 10 MG tablet Commonly known as: ZYRTEC TAKE 1 TABLET (10 MG TOTAL) BY MOUTH DAILY.   Chantix Starting Month Pak 0.5 MG X 11 & 1 MG X 42 tablet Generic drug: varenicline Take one 0.5 mg tablet by mouth  once daily for 3 days, then increase to one 0.5 mg tablet twice daily for 4 days, then increase to one 1 mg tablet twice daily.   diltiazem 240 MG 24 hr capsule Commonly known as: Dilt-XR Take 1 capsule (240 mg total) by mouth daily.   Fluticasone-Salmeterol 250-50 MCG/DOSE Aepb Commonly known as: ADVAIR Inhale 1 puff into the lungs 2 (two) times daily.   olmesartan 40 MG tablet Commonly known as: BENICAR Take 1 tablet (40 mg total) by mouth daily.   pravastatin 10 MG tablet Commonly known as: PRAVACHOL Take 1  tablet (10 mg total) by mouth daily.   triamcinolone cream 0.1 % Commonly known as: KENALOG Apply 1 application topically 2 (two) times daily.   Vitamin D3 50 MCG (2000 UT) capsule Take 1 capsule (2,000 Units total) by mouth daily.       Allergies:  Allergies  Allergen Reactions  . Lisinopril Cough  . Lisinopril-Hydrochlorothiazide     dizziness    Family History: History reviewed. No pertinent family history.  Social History:  reports that he has been smoking cigarettes. He has a 33.00 pack-year smoking history. He has never used smokeless tobacco. He reports current alcohol use. He reports that he does not use drugs.  ROS: All other review of systems were reviewed and are negative except what is noted above in HPI  Physical Exam: BP (!) 153/70   Pulse 69   Temp 98.1 F (36.7 C)   Ht 6\' 2"  (1.88 m)   Wt 195 lb (88.5 kg)   BMI 25.04 kg/m   Constitutional:  Alert and oriented, No acute distress. HEENT: [place] AT, moist mucus membranes.  Trachea midline, no masses. Cardiovascular: No clubbing, cyanosis, or edema. Respiratory: Normal respiratory effort, no increased work of breathing. GI: Abdomen is soft, nontender, nondistended, no abdominal masses GU: No CVA tenderness.  Lymph: No cervical or inguinal lymphadenopathy. Skin: No rashes, bruises or suspicious lesions. Neurologic: Grossly intact, no focal deficits, moving all 4 extremities. Psychiatric: Normal mood and affect.  Laboratory Data: Lab Results  Component Value Date   WBC 6.3 07/08/2018   HGB 14.2 07/08/2018   HCT 41.8 07/08/2018   MCV 85.3 07/08/2018   PLT 268.0 07/08/2018    Lab Results  Component Value Date   CREATININE 1.61 (H) 10/02/2019    Lab Results  Component Value Date   PSA 5.4 (H) 09/14/2016   PSA 5.58 (H) 01/24/2016    No results found for: TESTOSTERONE  Lab Results  Component Value Date   HGBA1C 5.6 10/02/2019    Urinalysis    Component Value Date/Time   BILIRUBINUR  negative 10/17/2017 1615   KETONESUR negative 10/17/2017 1615   PROTEINUR negative 10/17/2017 1615   UROBILINOGEN 0.2 10/17/2017 1615   NITRITE Negative 10/17/2017 1615   LEUKOCYTESUR Negative 10/17/2017 1615    Lab Results  Component Value Date   LABMICR 32.9 11/14/2017    Pertinent Imaging:  No results found for this or any previous visit.  No results found for this or any previous visit.  No results found for this or any previous visit.  No results found for this or any previous visit.  Results for orders placed during the hospital encounter of 05/27/20  US RENAL  Narrative CLINICAL DATA:  CKD stage 3  EXAM: RENAL / URINARY TRACT ULTRASOUND COMPLETE  COMPARISON:  Chest CT acquired on September 25, 2019, limited assessment of the kidneys at that time.  FINDINGS: [name] Kidney:  Renal measurements: 10.7 x  4.0 x 4.5 cm = volume: 102 mL. Small hypoechoic to anechoic focus in the upper pole of the [name] kidney shows some central increased echogenicity and perhaps multi septate changes.  3.9 x 3.3 x 2.7 cm anechoic cyst with adjacent smaller cystic area 1.2 x 1.3 x 1.3 cm cannot be classified as a simple cyst and does not show overt signs of flow. No hydronephrosis. Decreased corticomedullary differentiation.  Left Kidney:  Renal measurements: 10.6 x 5.7 x 4.8 cm = volume: 151 mL. Decreased corticomedullary differentiation without hydronephrosis. Small mildly complex areas with multi septate or spongiform appearance in the upper pole and interpolar portion largest measuring 1.6 x 1.4 x 1.5 cm with smaller areas, less than 10 also showing some cystic characteristics.  Bladder:  Appears normal for degree of bladder distention.  Other:  None.  IMPRESSION: 1. Decreased corticomedullary differentiation without hydronephrosis. Findings can be seen in the setting of medical renal disease. 2. Multiple cystic lesions in the bilateral kidneys without  renal enlargement. Some of these areas display internal complexity/variable echogenicity. MRI may be helpful for further evaluation to exclude the presence of enhancing components/small cystic renal neoplasm.  These results will be called to the ordering clinician or representative by the Radiologist Assistant, and communication documented in the PACS or [employer].   Electronically Signed By: Zetta Bills M.D. On: 05/30/2020 10:55  No results found for this or any previous visit.  No results found for this or any previous visit.  No results found for this or any previous visit.   Assessment & Plan:    1. Benign prostatic hyperplasia with urinary obstruction -Uroxatral 10mg    2. Nocturia -uroxatral 10mg  qhs.  3. Prostate cancer (HCC) PSA today, if stable, RTC 6 months with PSA   No follow-ups on file.  Nicolette Bang, MD  [facility] [place]

## 2020-07-28 LAB — PSA: Prostate Specific Ag, Serum: 16.5 ng/mL — ABNORMAL HIGH (ref 0.0–4.0)

## 2020-08-02 ENCOUNTER — Telehealth: Payer: Self-pay

## 2020-08-02 ENCOUNTER — Other Ambulatory Visit: Payer: Self-pay

## 2020-08-02 DIAGNOSIS — C61 Malignant neoplasm of prostate: Secondary | ICD-10-CM

## 2020-08-02 NOTE — Telephone Encounter (Signed)
-----   Message from Cleon Gustin, MD sent at 08/02/2020  8:03 AM EDT ----- PSa increased to 16. The patient will need to be scheduled for a prostate MRI ----- Message ----- From: Dorisann Frames, RN Sent: 07/28/2020   7:37 AM EDT To: Cleon Gustin, MD  Please review

## 2020-08-02 NOTE — Telephone Encounter (Signed)
Pt called and made aware. MRI order placed. Pt aware.

## 2020-08-11 DIAGNOSIS — E785 Hyperlipidemia, unspecified: Secondary | ICD-10-CM | POA: Diagnosis not present

## 2020-08-11 DIAGNOSIS — R361 Hematospermia: Secondary | ICD-10-CM | POA: Diagnosis not present

## 2020-08-11 DIAGNOSIS — J449 Chronic obstructive pulmonary disease, unspecified: Secondary | ICD-10-CM | POA: Diagnosis not present

## 2020-08-11 DIAGNOSIS — N1831 Chronic kidney disease, stage 3a: Secondary | ICD-10-CM | POA: Diagnosis not present

## 2020-08-11 DIAGNOSIS — C61 Malignant neoplasm of prostate: Secondary | ICD-10-CM | POA: Diagnosis not present

## 2020-08-11 DIAGNOSIS — Z720 Tobacco use: Secondary | ICD-10-CM | POA: Diagnosis not present

## 2020-08-11 DIAGNOSIS — I129 Hypertensive chronic kidney disease with stage 1 through stage 4 chronic kidney disease, or unspecified chronic kidney disease: Secondary | ICD-10-CM | POA: Diagnosis not present

## 2020-08-11 DIAGNOSIS — R7303 Prediabetes: Secondary | ICD-10-CM | POA: Diagnosis not present

## 2020-08-22 ENCOUNTER — Other Ambulatory Visit: Payer: Self-pay | Admitting: Emergency Medicine

## 2020-08-22 DIAGNOSIS — I10 Essential (primary) hypertension: Secondary | ICD-10-CM

## 2020-08-26 ENCOUNTER — Ambulatory Visit: Payer: Medicare HMO | Admitting: Internal Medicine

## 2020-09-06 ENCOUNTER — Encounter: Payer: Self-pay | Admitting: Internal Medicine

## 2020-09-06 ENCOUNTER — Other Ambulatory Visit: Payer: Self-pay

## 2020-09-06 ENCOUNTER — Ambulatory Visit: Payer: Medicare HMO | Admitting: Internal Medicine

## 2020-09-06 VITALS — BP 124/58 | HR 65 | Temp 98.0°F | Ht 74.0 in | Wt 200.8 lb

## 2020-09-06 DIAGNOSIS — Z23 Encounter for immunization: Secondary | ICD-10-CM | POA: Diagnosis not present

## 2020-09-06 DIAGNOSIS — F17210 Nicotine dependence, cigarettes, uncomplicated: Secondary | ICD-10-CM | POA: Diagnosis not present

## 2020-09-06 DIAGNOSIS — J449 Chronic obstructive pulmonary disease, unspecified: Secondary | ICD-10-CM | POA: Diagnosis not present

## 2020-09-06 NOTE — Patient Instructions (Addendum)
We will be calling to schedule your CT Chest (as part of the lung cancer screening program)   The key is to stop smoking completely before smoking completely stops you!    If you are satisfied with your treatment plan,  let your doctor know and he/she can either refill your medications or you can return here when your prescription runs out.     If in any way you are not 100% satisfied,  please tell us.  If 100% better, tell your friends!  Pulmonary follow up is as needed

## 2020-09-06 NOTE — Progress Notes (Signed)
Subjective:     Patient ID: Andrew [name], male   DOB: May 18, 1951,    MRN: [id number]     Brief patient profile:  69yobm active smoker referred to pulmonary clinic 09/20/2017 by Dr   Ranee Gosselin for copd eval with GOLD II criteria documented 12/09/17    History of Present Illness  09/20/2017 1st [facility] office visit/ Andrew [name]   Chief Complaint  Patient presents with  . Pulmonary Consult    Referred by [name] for eval of COPD. Pt states his breathing is currently at baseline. He uses an albuterol inhaler 2-3 x per wk and has atrovent and albuterol nebs that he rarely uses.    exac 07/21/17 did not req admit with rec stating on Breo 200 denies taking too expensive so started symb ? Strength p er rx but costs 300 per month  Baseline doe now on rx = best days Not limited by breathing from desired activities lots of fast pace walking at [employer]/ some hills ok  rec Plan A = Automatic = symbiocort 160=dulera 200 Take 2 puffs first thing in am and then another 2 puffs about 12 hours later.  Work on inhaler technique:  relax and gently blow all the way out then take a nice smooth deep breath back in, triggering the inhaler at same time you start breathing in.  Hold for up to 5 seconds if you can. Blow out thru nose. Rinse and gargle with water when done Plan B = Backup Only use your albuterol as a rescue medication Plan C = Crisis - only use your albuterol nebulizer if you first try Plan B     12/09/2017  f/u ov/Andrew [name] re: COPD actually gold II with reversibility/ still smoking  Chief Complaint  Patient presents with  . Follow-up    follow up for COPD.   Dyspnea:   Improved:  MMRC1 = can walk nl pace, flat grade, can't hurry or go uphills or steps s sob   Cough: no Sleep: no No need for albuterol  rec No change in medications  Symbicort 160 = dulera 200 Take 2 puffs first thing in am and then another 2 puffs about 12 hours later.  The key is to stop smoking  completely before smoking completely stops you - it's not too late!    07/08/2018  f/u ov/Andrew [name] re:  COPD  GOLD II with reversiblity  Chief Complaint  Patient presents with  . Follow-up    6 month follow up. Patient states he does still have a cough. Productive cough with clear mucus. Also has some post nasal drip. States he will develop a runny nose.   Dyspnea:  Not limited by breathing from desired activities   Cough: mostly in am / clear mucus x 4y s change assoc with pnds sensation no better with zyrtec or atrovent  Sleeping: 10 degrees on 4 pillows  SABA use: once a week 02: none  rec Please remember to go to the lab department downstairs in the basement  for your tests - we    Keep your appt for the lung cancer screening planned > did not do See if dulera is more affordable otherwise continue symb 160    06/16/2019  f/u ov/Andrew [name] re: GOLD II with reversibility / still smoking / maint on symb 160 2bid Chief Complaint  Patient presents with  . Follow-up    F/U for COPD, pt reports usual DOE & chronic cough w/ clear mucus and runny  nose, reports taking Mucinex once daily; reports taking Symbicort daily (unsure of strength)  Dyspnea:  MMRC1 = can walk nl pace, flat grade, can't hurry or go uphills or steps s sob   Cough: mostly in am x 5 y s change/ better on mucinex  Sleeping: on back with 4 pillows SABA use: none 02: none  rec LCS  > neg    09/06/2020  f/u ov/Andrew [name] re: GOLD II with reversibility / maint on advair 250 one bid  Chief Complaint  Patient presents with  . Follow-up    1 year rov copd.  pt doing well, notes occasional DOE but no other complaints.     Dyspnea:  MMRC1 = can walk nl pace, flat grade, can't hurry or go uphills or steps s sob   Cough: none  Sleeping: ok flat SABA use: one or twice a week, no neb 02: none    No obvious day to day or daytime variability or assoc excess/ purulent sputum or mucus plugs or hemoptysis or cp or chest tightness, subjective  wheeze or overt sinus or hb symptoms.   sleeping without nocturnal  or early am exacerbation  of respiratory  c/o's or need for noct saba. Also denies any obvious fluctuation of symptoms with weather or environmental changes or other aggravating or alleviating factors except as outlined above   No unusual exposure hx or h/o childhood pna/ asthma or knowledge of premature birth.  Current Allergies, Complete Past Medical History, Past Surgical History, Family History, and Social History were reviewed in [employer] record.  ROS  The following are not active complaints unless bolded Hoarseness, sore throat, dysphagia, dental problems, itching, sneezing,  nasal congestion or discharge of excess mucus or purulent secretions, ear ache,   fever, chills, sweats, unintended wt loss or wt gain, classically pleuritic or exertional cp,  orthopnea pnd or arm/hand swelling  or leg swelling, presyncope, palpitations, abdominal pain, anorexia, nausea, vomiting, diarrhea  or change in bowel habits or change in bladder habits, change in stools or change in urine, dysuria, hematuria,  rash, arthralgias, visual complaints, headache, numbness, weakness or ataxia or problems with walking or coordination,  change in mood or  memory.        Current Meds  Medication Sig  . albuterol (PROVENTIL HFA;VENTOLIN HFA) 108 (90 Base) MCG/ACT inhaler Inhale 2 puffs into the lungs every 4 (four) hours as needed for wheezing or shortness of breath (cough, shortness of breath or wheezing.).  Marland Kitchen albuterol (PROVENTIL) (2.5 MG/3ML) 0.083% nebulizer solution Take 3 mLs (2.5 mg total) by nebulization every 6 (six) hours as needed for wheezing or shortness of breath.  . alfuzosin (UROXATRAL) 10 MG 24 hr tablet Take 1 tablet (10 mg total) by mouth daily with breakfast.  . cetirizine (ZYRTEC) 10 MG tablet TAKE 1 TABLET (10 MG TOTAL) BY MOUTH DAILY.  Marland Kitchen Cholecalciferol (VITAMIN D3) 50 MCG (2000 UT) capsule Take 1 capsule  (2,000 Units total) by mouth daily.  . Fluticasone-Salmeterol (ADVAIR) 250-50 MCG/DOSE AEPB Inhale 1 puff into the lungs 2 (two) times daily.   Marland Kitchen olmesartan (BENICAR) 40 MG tablet Take 1 tablet (40 mg total) by mouth daily.  . pravastatin (PRAVACHOL) 10 MG tablet Take 1 tablet (10 mg total) by mouth daily.  Marland Kitchen sulfamethoxazole-trimethoprim (BACTRIM DS) 800-160 MG tablet Take 1 tablet by mouth every 12 (twelve) hours.  . tadalafil (CIALIS) 20 MG tablet Take 1 tablet (20 mg total) by mouth daily as needed for erectile dysfunction.  Marland Kitchen  triamcinolone cream (KENALOG) 0.1 % Apply 1 application topically 2 (two) times daily.  . varenicline (CHANTIX STARTING MONTH PAK) 0.5 MG X 11 & 1 MG X 42 tablet Take one 0.5 mg tablet by mouth once daily for 3 days, then increase to one 0.5 mg tablet twice daily for 4 days, then increase to one 1 mg tablet twice daily.                    Objective:   Physical Exam   amb bm nad  09/06/2020    200  06/16/2019        194  07/08/2018       204  12/09/2017       207   09/20/17 202 lb (91.6 kg)  09/19/17 202 lb (91.6 kg)  09/03/17 201 lb 3.2 oz (91.3 kg)     Vital signs reviewed  09/06/2020  - Note at rest 02 sats  98% on RA     HEENT: Upper plate/ lower partial   HEENT : pt wearing mask not removed for exam due to covid - 19 concerns.    NECK :  without JVD/Nodes/TM/ nl carotid upstrokes bilaterally   LUNGS: no acc muscle use,  Mild barrel  contour chest wall with bilateral  Distant bs s audible wheeze and  without cough on insp or exp maneuvers  and mild  Hyperresonant  to  percussion bilaterally     CV:  RRR  no s3 or murmur or increase in P2, and no edema   ABD:  soft and nontender with pos end  insp Hoover's  in the supine position. No bruits or organomegaly appreciated, bowel sounds nl  MS:   Nl gait/  ext warm without deformities, calf tenderness, cyanosis or clubbing No obvious joint restrictions   SKIN: warm and dry without lesions     NEURO:  alert, approp, nl sensorium with  no motor or cerebellar deficits apparent.              Assessment:

## 2020-09-07 ENCOUNTER — Encounter: Payer: Self-pay | Admitting: Internal Medicine

## 2020-09-07 ENCOUNTER — Encounter: Payer: Self-pay | Admitting: Urology

## 2020-09-07 ENCOUNTER — Ambulatory Visit (INDEPENDENT_AMBULATORY_CARE_PROVIDER_SITE_OTHER): Payer: Medicare HMO | Admitting: Urology

## 2020-09-07 VITALS — BP 135/64 | HR 76 | Temp 98.2°F | Ht 74.0 in | Wt 200.8 lb

## 2020-09-07 DIAGNOSIS — N138 Other obstructive and reflux uropathy: Secondary | ICD-10-CM | POA: Diagnosis not present

## 2020-09-07 DIAGNOSIS — C61 Malignant neoplasm of prostate: Secondary | ICD-10-CM

## 2020-09-07 DIAGNOSIS — R351 Nocturia: Secondary | ICD-10-CM

## 2020-09-07 DIAGNOSIS — N401 Enlarged prostate with lower urinary tract symptoms: Secondary | ICD-10-CM

## 2020-09-07 MED ORDER — ALFUZOSIN HCL ER 10 MG PO TB24
10.0000 mg | ORAL_TABLET | Freq: Every day | ORAL | 3 refills | Status: DC
Start: 2020-09-07 — End: 2021-01-09

## 2020-09-07 NOTE — Assessment & Plan Note (Signed)
Active smoker  07/19/17 CT Mild centrilobular emphysematous changes, upper lobe predominant. - Spirometry 09/20/2017  FEV1 1.45 (42%)  Ratio 50 with typical curvature > dulera 200 ([place]ana) 2bid   -  PFT's  12/09/2017  FEV1 2.38 (72 % ) ratio 60  p 1 % improvement from saba p symbicort 160 prior to study with DLCO  62/66 % corrects to 71  % for alv volume   - 12/09/2017   continue dulera 200 or symb 160 depending on insurance  06/16/2019  After extensive coaching inhaler device,  effectiveness =   90%  - stable on advair dpi 250 one bid > f/u pulmonary clinic is prn

## 2020-09-07 NOTE — Progress Notes (Signed)
Urological Symptom Review  Patient is experiencing the following symptoms: Get up at night to urinate Weak stream   Review of Systems  Gastrointestinal (upper)  : Negative for upper GI symptoms  Gastrointestinal (lower) : Negative for lower GI symptoms  Constitutional : Negative for symptoms  Skin: Skin rash/lesion  Eyes: Negative for eye symptoms  Ear/Nose/Throat : Negative for Ear/Nose/Throat symptoms  Hematologic/Lymphatic: Negative for Hematologic/Lymphatic symptoms  Cardiovascular : Negative for cardiovascular symptoms  Respiratory : Shortness of breath  Endocrine: Negative for endocrine symptoms  Musculoskeletal: Negative for musculoskeletal symptoms  Neurological: Negative for neurological symptoms  Psychologic: Negative for psychiatric symptoms

## 2020-09-07 NOTE — Progress Notes (Signed)
09/07/2020 4:03 PM   Andrew [name] Jul 28, 1951 [id number]  Referring provider: Horald Pollen, MD [place],  [place] [address]  Followup nocturia  HPI: [name] is a 69yo here for followup for BPH and nocturia. He was started on uroxatral 10mg  qhs. His stream improved, dribbling resolved. Nocturia decreased to 1x. No incontinence. No urgency or frequency. No hesitancy. No dysuria or hematuria   PMH: Past Medical History:  Diagnosis Date  . Asthma   . Carotid artery stenosis, asymptomatic, bilateral 12/06/2017   Consider recheck Dopplers in 6 months -> 05/2018 Mild to moderate bilateral carotid artery stenosis Carotid Dopplers done at [facility] cardiovascular 11/26/2017 show left internal carotid artery stenosis 50-69% with soft plaque.  Mild stenosis in the left common carotid artery <50%. Mild stenosis in the [name] common carotid artery <50% with moderate diffuse soft plaque.  Mild stenosis in the [name] e  . COCAINE ABUSE 01/30/2008   Qualifier: Diagnosis of  By: Amil Amen MD, Benjamine Mola    . COPD (chronic obstructive pulmonary disease) ([place])   . Hypertension     Surgical History: No past surgical history on file.  Home Medications:  Allergies as of 09/07/2020      Reactions   Lisinopril Cough   Lisinopril-hydrochlorothiazide    dizziness      Medication List       Accurate as of September 07, 2020  4:03 PM. If you have any questions, ask your nurse or doctor.        albuterol 108 (90 Base) MCG/ACT inhaler Commonly known as: VENTOLIN HFA Inhale 2 puffs into the lungs every 4 (four) hours as needed for wheezing or shortness of breath (cough, shortness of breath or wheezing.).   albuterol (2.5 MG/3ML) 0.083% nebulizer solution Commonly known as: PROVENTIL Take 3 mLs (2.5 mg total) by nebulization every 6 (six) hours as needed for wheezing or shortness of breath.   alfuzosin 10 MG 24 hr tablet Commonly known as: UROXATRAL Take 1 tablet (10 mg total)  by mouth daily with breakfast.   cetirizine 10 MG tablet Commonly known as: ZYRTEC TAKE 1 TABLET (10 MG TOTAL) BY MOUTH DAILY.   Chantix Starting Month Pak 0.5 MG X 11 & 1 MG X 42 tablet Generic drug: varenicline Take one 0.5 mg tablet by mouth once daily for 3 days, then increase to one 0.5 mg tablet twice daily for 4 days, then increase to one 1 mg tablet twice daily.   diltiazem 240 MG 24 hr capsule Commonly known as: Dilt-XR Take 1 capsule (240 mg total) by mouth daily.   Fluticasone-Salmeterol 250-50 MCG/DOSE Aepb Commonly known as: ADVAIR Inhale 1 puff into the lungs 2 (two) times daily.   olmesartan 40 MG tablet Commonly known as: BENICAR Take 1 tablet (40 mg total) by mouth daily.   pravastatin 10 MG tablet Commonly known as: PRAVACHOL Take 1 tablet (10 mg total) by mouth daily.   sulfamethoxazole-trimethoprim 800-160 MG tablet Commonly known as: BACTRIM DS Take 1 tablet by mouth every 12 (twelve) hours.   tadalafil 20 MG tablet Commonly known as: CIALIS Take 1 tablet (20 mg total) by mouth daily as needed for erectile dysfunction.   triamcinolone cream 0.1 % Commonly known as: KENALOG Apply 1 application topically 2 (two) times daily.   Vitamin D3 50 MCG (2000 UT) capsule Take 1 capsule (2,000 Units total) by mouth daily.       Allergies:  Allergies  Allergen Reactions  . Lisinopril Cough  .  Lisinopril-Hydrochlorothiazide     dizziness    Family History: No family history on file.  Social History:  reports that he has been smoking cigarettes. He has a 33.00 pack-year smoking history. He has never used smokeless tobacco. He reports current alcohol use. He reports that he does not use drugs.  ROS: All other review of systems were reviewed and are negative except what is noted above in HPI  Physical Exam: BP 135/64   Pulse 76   Temp 98.2 F (36.8 C)   Ht 6\' 2"  (1.88 m)   Wt 200 lb 12.8 oz (91.1 kg)   BMI 25.78 kg/m   Constitutional:  Alert  and oriented, No acute distress. HEENT: [place] AT, moist mucus membranes.  Trachea midline, no masses. Cardiovascular: No clubbing, cyanosis, or edema. Respiratory: Normal respiratory effort, no increased work of breathing. GI: Abdomen is soft, nontender, nondistended, no abdominal masses GU: No CVA tenderness.  Lymph: No cervical or inguinal lymphadenopathy. Skin: No rashes, bruises or suspicious lesions. Neurologic: Grossly intact, no focal deficits, moving all 4 extremities. Psychiatric: Normal mood and affect.  Laboratory Data: Lab Results  Component Value Date   WBC 6.3 07/08/2018   HGB 14.2 07/08/2018   HCT 41.8 07/08/2018   MCV 85.3 07/08/2018   PLT 268.0 07/08/2018    Lab Results  Component Value Date   CREATININE 1.61 (H) 10/02/2019    Lab Results  Component Value Date   PSA 5.4 (H) 09/14/2016   PSA 5.58 (H) 01/24/2016    No results found for: TESTOSTERONE  Lab Results  Component Value Date   HGBA1C 5.6 10/02/2019    Urinalysis    Component Value Date/Time   APPEARANCEUR Clear 07/27/2020 1142   GLUCOSEU Negative 07/27/2020 1142   BILIRUBINUR Negative 07/27/2020 1142   KETONESUR negative 10/17/2017 1615   PROTEINUR 1+ (A) 07/27/2020 1142   UROBILINOGEN 0.2 10/17/2017 1615   NITRITE Negative 07/27/2020 1142   LEUKOCYTESUR Negative 07/27/2020 1142    Lab Results  Component Value Date   LABMICR See below: 07/27/2020   WBCUA None seen 07/27/2020   LABEPIT None seen 07/27/2020   BACTERIA None seen 07/27/2020    Pertinent Imaging:  No results found for this or any previous visit.  No results found for this or any previous visit.  No results found for this or any previous visit.  No results found for this or any previous visit.  Results for orders placed during the hospital encounter of 05/27/20  US RENAL  Narrative CLINICAL DATA:  CKD stage 3  EXAM: RENAL / URINARY TRACT ULTRASOUND COMPLETE  COMPARISON:  Chest CT acquired on September 25, 2019, limited assessment of the kidneys at that time.  FINDINGS: [name] Kidney:  Renal measurements: 10.7 x 4.0 x 4.5 cm = volume: 102 mL. Small hypoechoic to anechoic focus in the upper pole of the [name] kidney shows some central increased echogenicity and perhaps multi septate changes.  3.9 x 3.3 x 2.7 cm anechoic cyst with adjacent smaller cystic area 1.2 x 1.3 x 1.3 cm cannot be classified as a simple cyst and does not show overt signs of flow. No hydronephrosis. Decreased corticomedullary differentiation.  Left Kidney:  Renal measurements: 10.6 x 5.7 x 4.8 cm = volume: 151 mL. Decreased corticomedullary differentiation without hydronephrosis. Small mildly complex areas with multi septate or spongiform appearance in the upper pole and interpolar portion largest measuring 1.6 x 1.4 x 1.5 cm with smaller areas, less than 10 also showing some cystic  characteristics.  Bladder:  Appears normal for degree of bladder distention.  Other:  None.  IMPRESSION: 1. Decreased corticomedullary differentiation without hydronephrosis. Findings can be seen in the setting of medical renal disease. 2. Multiple cystic lesions in the bilateral kidneys without renal enlargement. Some of these areas display internal complexity/variable echogenicity. MRI may be helpful for further evaluation to exclude the presence of enhancing components/small cystic renal neoplasm.  These results will be called to the ordering clinician or representative by the Radiologist Assistant, and communication documented in the PACS or [employer].   Electronically Signed By: Zetta Bills M.D. On: 05/30/2020 10:55  No results found for this or any previous visit.  No results found for this or any previous visit.  No results found for this or any previous visit.   Assessment & Plan:    1. Benign prostatic hyperplasia with urinary obstruction -continue uroxatral 10mg  - Urinalysis, Routine w  reflex microscopic  2. Nocturia Continue uroxatral 10mg  qhs   No follow-ups on file.  Nicolette Bang, MD  [facility] [place]

## 2020-09-07 NOTE — Patient Instructions (Signed)

## 2020-09-08 LAB — URINALYSIS, ROUTINE W REFLEX MICROSCOPIC
Bilirubin, UA: NEGATIVE
Glucose, UA: NEGATIVE
Ketones, UA: NEGATIVE
Leukocytes,UA: NEGATIVE
Nitrite, UA: NEGATIVE
Protein,UA: NEGATIVE
RBC, UA: NEGATIVE
Specific Gravity, UA: 1.02 (ref 1.005–1.030)
Urobilinogen, Ur: 0.2 mg/dL (ref 0.2–1.0)
pH, UA: 5 (ref 5.0–7.5)

## 2020-09-27 ENCOUNTER — Other Ambulatory Visit: Payer: Self-pay

## 2020-09-27 ENCOUNTER — Ambulatory Visit (INDEPENDENT_AMBULATORY_CARE_PROVIDER_SITE_OTHER): Payer: Medicare HMO | Admitting: Emergency Medicine

## 2020-09-27 ENCOUNTER — Encounter: Payer: Self-pay | Admitting: Emergency Medicine

## 2020-09-27 VITALS — BP 110/58 | HR 67 | Temp 98.2°F | Resp 16 | Ht 74.0 in | Wt 200.0 lb

## 2020-09-27 DIAGNOSIS — R7303 Prediabetes: Secondary | ICD-10-CM

## 2020-09-27 DIAGNOSIS — I2510 Atherosclerotic heart disease of native coronary artery without angina pectoris: Secondary | ICD-10-CM

## 2020-09-27 DIAGNOSIS — J449 Chronic obstructive pulmonary disease, unspecified: Secondary | ICD-10-CM | POA: Diagnosis not present

## 2020-09-27 DIAGNOSIS — E785 Hyperlipidemia, unspecified: Secondary | ICD-10-CM | POA: Diagnosis not present

## 2020-09-27 DIAGNOSIS — I10 Essential (primary) hypertension: Secondary | ICD-10-CM

## 2020-09-27 DIAGNOSIS — I700 Atherosclerosis of aorta: Secondary | ICD-10-CM | POA: Diagnosis not present

## 2020-09-27 DIAGNOSIS — N1831 Chronic kidney disease, stage 3a: Secondary | ICD-10-CM | POA: Diagnosis not present

## 2020-09-27 DIAGNOSIS — D6869 Other thrombophilia: Secondary | ICD-10-CM

## 2020-09-27 DIAGNOSIS — Z1211 Encounter for screening for malignant neoplasm of colon: Secondary | ICD-10-CM

## 2020-09-27 MED ORDER — ROSUVASTATIN CALCIUM 10 MG PO TABS: 10.0000 mg | ORAL_TABLET | Freq: Every day | ORAL | 3 refills | Status: DC

## 2020-09-27 NOTE — Progress Notes (Signed)
Andrew [name] 69 y.o.   Chief Complaint  Patient presents with  . Follow-up    6 month follow up/ no concerns  . Hypertension    HISTORY OF PRESENT ILLNESS: This is a 69 y.o. male with history of hypertension here for follow-up. Presently taking diltiazem and olmesartan. Recently seen by urologist and started on alfuzosin. Negative for prostate cancer. Needs GI referral for colonoscopy. Smoking about a pack per week. Uses albuterol sparingly. No complaints or medical concerns today.  HPI   Prior to Admission medications   Medication Sig Start Date End Date Taking? Authorizing Provider  albuterol (PROVENTIL HFA;VENTOLIN HFA) 108 (90 Base) MCG/ACT inhaler Inhale 2 puffs into the lungs every 4 (four) hours as needed for wheezing or shortness of breath (cough, shortness of breath or wheezing.). 06/29/17  Yes Shawnee Knapp, MD  albuterol (PROVENTIL) (2.5 MG/3ML) 0.083% nebulizer solution Take 3 mLs (2.5 mg total) by nebulization every 6 (six) hours as needed for wheezing or shortness of breath. 03/29/20  Yes Aarica Wax, Ines Bloomer, MD  alfuzosin (UROXATRAL) 10 MG 24 hr tablet Take 1 tablet (10 mg total) by mouth daily with breakfast. 09/07/20  Yes McKenzie, Candee Furbish, MD  cetirizine (ZYRTEC) 10 MG tablet TAKE 1 TABLET (10 MG TOTAL) BY MOUTH DAILY. 05/05/20  Yes Byrd Terrero, Ines Bloomer, MD  Fluticasone-Salmeterol (ADVAIR) 250-50 MCG/DOSE AEPB Inhale 1 puff into the lungs 2 (two) times daily.    Yes [provider]  olmesartan (BENICAR) 40 MG tablet Take 1 tablet (40 mg total) by mouth daily. 06/27/20  Yes Samamtha Tiegs, Ines Bloomer, MD  pravastatin (PRAVACHOL) 10 MG tablet Take 1 tablet (10 mg total) by mouth daily. 11/04/19  Yes Delia Chimes A, MD  sulfamethoxazole-trimethoprim (BACTRIM DS) 800-160 MG tablet Take 1 tablet by mouth every 12 (twelve) hours. 07/27/20  Yes McKenzie, Candee Furbish, MD  tadalafil (CIALIS) 20 MG tablet Take 1 tablet (20 mg total) by mouth daily as needed for erectile  dysfunction. 07/27/20  Yes McKenzie, Candee Furbish, MD  triamcinolone cream (KENALOG) 0.1 % Apply 1 application topically 2 (two) times daily. 03/29/20  Yes Annjeanette Sarwar, Ines Bloomer, MD  varenicline (CHANTIX STARTING MONTH PAK) 0.5 MG X 11 & 1 MG X 42 tablet Take one 0.5 mg tablet by mouth once daily for 3 days, then increase to one 0.5 mg tablet twice daily for 4 days, then increase to one 1 mg tablet twice daily. 05/05/20  Yes Laelyn Blumenthal, Ines Bloomer, MD  Cholecalciferol (VITAMIN D3) 50 MCG (2000 UT) capsule Take 1 capsule (2,000 Units total) by mouth daily. Patient not taking: Reported on 09/27/2020 10/02/19   Forrest Moron, MD  diltiazem (DILT-XR) 240 MG 24 hr capsule Take 1 capsule (240 mg total) by mouth daily. 05/31/20 08/29/20  Horald Pollen, MD    Allergies  Allergen Reactions  . Lisinopril Cough  . Lisinopril-Hydrochlorothiazide     dizziness    Patient Active Problem List   Diagnosis Date Noted  . Nocturia 07/27/2020  . Acquired thrombophilia ([place]) 05/05/2020  . Prostate cancer ([place]) 01/14/2018  . Carotid artery stenosis, asymptomatic, bilateral 12/06/2017  . Vitamin D deficiency 12/03/2017  . Prediabetes 11/14/2017  . Chronic kidney disease (CKD) stage G3a/A2, moderately decreased glomerular filtration rate (GFR) between 45-59 mL/min/1.73 square meter and albuminuria creatinine ratio between 30-299 mg/g (HCC) 11/13/2017  . Cigarette smoker 08/20/2017  . COPD GOLD 2 still smoking  07/31/2017  . Coronary atherosclerosis of native coronary artery 07/31/2017  . Benign prostatic hyperplasia with  urinary obstruction 05/30/2016  . DENTAL CARIES 06/01/2008  . GERD 03/04/2008  . Hyperlipidemia LDL goal <70 01/30/2008  . Essential hypertension 01/30/2008  . PERIPHERAL VASCULAR DISEASE 01/30/2008    Past Medical History:  Diagnosis Date  . Asthma   . Carotid artery stenosis, asymptomatic, bilateral 12/06/2017   Consider recheck Dopplers in 6 months -> 05/2018 Mild to moderate  bilateral carotid artery stenosis Carotid Dopplers done at [facility] cardiovascular 11/26/2017 show left internal carotid artery stenosis 50-69% with soft plaque.  Mild stenosis in the left common carotid artery <50%. Mild stenosis in the [name] common carotid artery <50% with moderate diffuse soft plaque.  Mild stenosis in the [name] e  . COCAINE ABUSE 01/30/2008   Qualifier: Diagnosis of  By: Amil Amen MD, Benjamine Mola    . COPD (chronic obstructive pulmonary disease) ([place])   . Hypertension     History reviewed. No pertinent surgical history.  Social History   Socioeconomic History  . Marital status: Legally Separated    Spouse name: Not on file  . Number of children: 1  . Years of education: Not on file  . Highest education level: Not on file  Occupational History  . Not on file  Tobacco Use  . Smoking status: Current Some Day Smoker    Packs/day: 0.75    Years: 44.00    Pack years: 33.00    Types: Cigarettes  . Smokeless tobacco: Never Used  . Tobacco comment: 08/04 pack lasts pt for a week (3-4 cigs/daily)  Vaping Use  . Vaping Use: Never used  Substance and Sexual Activity  . Alcohol use: Yes    Comment: occasional  . Drug use: No  . Sexual activity: Not on file  Other Topics Concern  . Not on file  Social History Narrative  . Not on file   Social Determinants of Health   Financial Resource Strain:   . Difficulty of Paying Living Expenses: Not on file  Food Insecurity:   . Worried About Charity fundraiser in the Last Year: Not on file  . Ran Out of Food in the Last Year: Not on file  Transportation Needs:   . Lack of Transportation (Medical): Not on file  . Lack of Transportation (Non-Medical): Not on file  Physical Activity:   . Days of Exercise per Week: Not on file  . Minutes of Exercise per Session: Not on file  Stress:   . Feeling of Stress : Not on file  Social Connections:   . Frequency of Communication with Friends and Family: Not on file  . Frequency of  Social Gatherings with Friends and Family: Not on file  . Attends Religious Services: Not on file  . Active Member of Clubs or Organizations: Not on file  . Attends Archivist Meetings: Not on file  . Marital Status: Not on file  Intimate Partner Violence:   . Fear of Current or Ex-Partner: Not on file  . Emotionally Abused: Not on file  . Physically Abused: Not on file  . Sexually Abused: Not on file    History reviewed. No pertinent family history.   Review of Systems  Constitutional: Negative.  Negative for chills and fever.  HENT: Negative.  Negative for congestion and sore throat.   Respiratory: Negative.  Negative for cough and shortness of breath.   Cardiovascular: Negative.  Negative for chest pain and palpitations.  Gastrointestinal: Negative.  Negative for abdominal pain, blood in stool, diarrhea, melena, nausea and vomiting.  Genitourinary:  Negative.  Negative for dysuria and hematuria.  Musculoskeletal: Negative.   Skin: Negative.  Negative for rash.  Neurological: Negative.  Negative for dizziness and headaches.  All other systems reviewed and are negative.   Vitals:   09/27/20 1616  BP: (!) 110/58  Pulse: 67  Resp: 16  Temp: 98.2 F (36.8 C)  SpO2: 98%   Wt Readings from Last 3 Encounters:  09/27/20 200 lb (90.7 kg)  09/07/20 200 lb 12.8 oz (91.1 kg)  09/06/20 200 lb 12.8 oz (91.1 kg)     Physical Exam Vitals reviewed.  Constitutional:      Appearance: Normal appearance.  HENT:     Head: Normocephalic.  Eyes:     Extraocular Movements: Extraocular movements intact.     Conjunctiva/sclera: Conjunctivae normal.     Pupils: Pupils are equal, round, and reactive to light.  Cardiovascular:     Rate and Rhythm: Normal rate and regular rhythm.     Pulses: Normal pulses.     Heart sounds: Normal heart sounds.  Pulmonary:     Effort: Pulmonary effort is normal.     Breath sounds: Normal breath sounds.  Musculoskeletal:        General:  Normal range of motion.     Cervical back: Normal range of motion and neck supple.  Skin:    General: Skin is warm and dry.     Capillary Refill: Capillary refill takes less than 2 seconds.  Neurological:     General: No focal deficit present.     Mental Status: He is alert and oriented to person, place, and time.  Psychiatric:        Mood and Affect: Mood normal.        Behavior: Behavior normal.    A total of 30 minutes was spent with the patient, greater than 50% of which was in counseling/coordination of care regarding multiple chronic medical problems and cardiovascular risks associated with these conditions, review of all medications, education on nutrition, smoking cessation advised, review of most recent office visit notes, health maintenance items including need to screen for colon cancer with colonoscopy, documentation, prognosis and need for follow-up.   ASSESSMENT & PLAN: Clinically stable.  No medical concerns identified during this visit. Continue present medication.  No changes. Follow-up in 6 months. Saad was seen today for follow-up and hypertension.  Diagnoses and all orders for this visit:  Essential hypertension  Stage 3a chronic kidney disease (HCC)  Dyslipidemia -     rosuvastatin (CRESTOR) 10 MG tablet; Take 1 tablet (10 mg total) by mouth daily.  Prediabetes  COPD GOLD 2 still smoking   Atherosclerosis of native coronary artery of native heart without angina pectoris  Aortic atherosclerosis (HCC)  Acquired thrombophilia ([place])  Colon cancer screening -     Ambulatory referral to Gastroenterology    Patient Instructions       If you have lab work done today you will be contacted with your lab results within the next 2 weeks.  If you have not heard from [place] then please contact us. The fastest way to get your results is to register for My Chart.   IF you received an x-ray today, you will receive an invoice from [facility]. Please  contact [facility] at [phone] with questions or concerns regarding your invoice.   IF you received labwork today, you will receive an invoice from [facility]. Please contact [facility] at [phone] with questions or concerns regarding your invoice.   Our  billing staff will not be able to assist you with questions regarding bills from these companies.  You will be contacted with the lab results as soon as they are available. The fastest way to get your results is to activate your My Chart account. Instructions are located on the last page of this paperwork. If you have not heard from [place] regarding the results in 2 weeks, please contact this office.      Health Maintenance After Age 42 After age 22, you are at a higher risk for certain long-term diseases and infections as well as injuries from falls. Falls are a major cause of broken bones and head injuries in people who are older than age 65. Getting regular preventive care can help to keep you healthy and well. Preventive care includes getting regular testing and making lifestyle changes as recommended by your health care provider. Talk with your health care provider about:  Which screenings and tests you should have. A screening is a test that checks for a disease when you have no symptoms.  A diet and exercise plan that is [name] for you. What should I know about screenings and tests to prevent falls? Screening and testing are the best ways to find a health problem early. Early diagnosis and treatment give you the best chance of managing medical conditions that are common after age 56. Certain conditions and lifestyle choices may make you more likely to have a fall. Your health care provider may recommend:  Regular vision checks. Poor vision and conditions such as cataracts can make you more likely to have a fall. If you wear glasses, make sure to get your prescription updated if your vision changes.  Medicine review. Work with  your health care provider to regularly review all of the medicines you are taking, including over-the-counter medicines. Ask your health care provider about any side effects that may make you more likely to have a fall. Tell your health care provider if any medicines that you take make you feel dizzy or sleepy.  Osteoporosis screening. Osteoporosis is a condition that causes the bones to get weaker. This can make the bones weak and cause them to break more easily.  Blood pressure screening. Blood pressure changes and medicines to control blood pressure can make you feel dizzy.  Strength and balance checks. Your health care provider may recommend certain tests to check your strength and balance while standing, walking, or changing positions.  Foot health exam. Foot pain and numbness, as well as not wearing proper footwear, can make you more likely to have a fall.  Depression screening. You may be more likely to have a fall if you have a fear of falling, feel emotionally low, or feel unable to do activities that you used to do.  Alcohol use screening. Using too much alcohol can affect your balance and may make you more likely to have a fall. What actions can I take to lower my risk of falls? General instructions  Talk with your health care provider about your risks for falling. Tell your health care provider if: ? You fall. Be sure to tell your health care provider about all falls, even ones that seem minor. ? You feel dizzy, sleepy, or off-balance.  Take over-the-counter and prescription medicines only as told by your health care provider. These include any supplements.  Eat a healthy diet and maintain a healthy weight. A healthy diet includes low-fat dairy products, low-fat (lean) meats, and fiber from whole grains, beans, and  lots of fruits and vegetables. Home safety  Remove any tripping hazards, such as rugs, cords, and clutter.  Install safety equipment such as grab bars in bathrooms  and safety rails on stairs.  Keep rooms and walkways well-lit. Activity   Follow a regular exercise program to stay fit. This will help you maintain your balance. Ask your health care provider what types of exercise are appropriate for you.  If you need a cane or walker, use it as recommended by your health care provider.  Wear supportive shoes that have nonskid soles. Lifestyle  Do not drink alcohol if your health care provider tells you not to drink.  If you drink alcohol, limit how much you have: ? 0-1 drink a day for women. ? 0-2 drinks a day for men.  Be aware of how much alcohol is in your drink. In the [place] one drink equals one typical bottle of beer (12 oz), one-half glass of wine (5 oz), or one shot of hard liquor (1 oz).  Do not use any products that contain nicotine or tobacco, such as cigarettes and e-cigarettes. If you need help quitting, ask your health care provider. Summary  Having a healthy lifestyle and getting preventive care can help to protect your health and wellness after age 52.  Screening and testing are the best way to find a health problem early and help you avoid having a fall. Early diagnosis and treatment give you the best chance for managing medical conditions that are more common for people who are older than age 30.  Falls are a major cause of broken bones and head injuries in people who are older than age 44. Take precautions to prevent a fall at home.  Work with your health care provider to learn what changes you can make to improve your health and wellness and to prevent falls. This information is not intended to replace advice given to you by your health care provider. Make sure you discuss any questions you have with your health care provider. Document Revised: 02/19/2019 Document Reviewed: 09/11/2017 Elsevier Patient Education  2020 [employer]vier Inc.      Agustina Caroli, MD [facility]

## 2020-09-27 NOTE — Patient Instructions (Addendum)
   If you have lab work done today you will be contacted with your lab results within the next 2 weeks.  If you have not heard from us then please contact us. The fastest way to get your results is to register for My Chart.   IF you received an x-ray today, you will receive an invoice from [facility]. Please contact [facility] at [phone] with questions or concerns regarding your invoice.   IF you received labwork today, you will receive an invoice from [facility]. Please contact [facility] at [phone] with questions or concerns regarding your invoice.   Our billing staff will not be able to assist you with questions regarding bills from these companies.  You will be contacted with the lab results as soon as they are available. The fastest way to get your results is to activate your My Chart account. Instructions are located on the last page of this paperwork. If you have not heard from us regarding the results in 2 weeks, please contact this office.     Health Maintenance After Age 65 After age 65, you are at a higher risk for certain long-term diseases and infections as well as injuries from falls. Falls are a major cause of broken bones and head injuries in people who are older than age 65. Getting regular preventive care can help to keep you healthy and well. Preventive care includes getting regular testing and making lifestyle changes as recommended by your health care provider. Talk with your health care provider about:  Which screenings and tests you should have. A screening is a test that checks for a disease when you have no symptoms.  A diet and exercise plan that is right for you. What should I know about screenings and tests to prevent falls? Screening and testing are the best ways to find a health problem early. Early diagnosis and treatment give you the best chance of managing medical conditions that are common after age 65. Certain conditions and  lifestyle choices may make you more likely to have a fall. Your health care provider may recommend:  Regular vision checks. Poor vision and conditions such as cataracts can make you more likely to have a fall. If you wear glasses, make sure to get your prescription updated if your vision changes.  Medicine review. Work with your health care provider to regularly review all of the medicines you are taking, including over-the-counter medicines. Ask your health care provider about any side effects that may make you more likely to have a fall. Tell your health care provider if any medicines that you take make you feel dizzy or sleepy.  Osteoporosis screening. Osteoporosis is a condition that causes the bones to get weaker. This can make the bones weak and cause them to break more easily.  Blood pressure screening. Blood pressure changes and medicines to control blood pressure can make you feel dizzy.  Strength and balance checks. Your health care provider may recommend certain tests to check your strength and balance while standing, walking, or changing positions.  Foot health exam. Foot pain and numbness, as well as not wearing proper footwear, can make you more likely to have a fall.  Depression screening. You may be more likely to have a fall if you have a fear of falling, feel emotionally low, or feel unable to do activities that you used to do.  Alcohol use screening. Using too much alcohol can affect your balance and may make you more likely to   have a fall. What actions can I take to lower my risk of falls? General instructions  Talk with your health care provider about your risks for falling. Tell your health care provider if: ? You fall. Be sure to tell your health care provider about all falls, even ones that seem minor. ? You feel dizzy, sleepy, or off-balance.  Take over-the-counter and prescription medicines only as told by your health care provider. These include any  supplements.  Eat a healthy diet and maintain a healthy weight. A healthy diet includes low-fat dairy products, low-fat (lean) meats, and fiber from whole grains, beans, and lots of fruits and vegetables. Home safety  Remove any tripping hazards, such as rugs, cords, and clutter.  Install safety equipment such as grab bars in bathrooms and safety rails on stairs.  Keep rooms and walkways well-lit. Activity   Follow a regular exercise program to stay fit. This will help you maintain your balance. Ask your health care provider what types of exercise are appropriate for you.  If you need a cane or walker, use it as recommended by your health care provider.  Wear supportive shoes that have nonskid soles. Lifestyle  Do not drink alcohol if your health care provider tells you not to drink.  If you drink alcohol, limit how much you have: ? 0-1 drink a day for women. ? 0-2 drinks a day for men.  Be aware of how much alcohol is in your drink. In the [place] one drink equals one typical bottle of beer (12 oz), one-half glass of wine (5 oz), or one shot of hard liquor (1 oz).  Do not use any products that contain nicotine or tobacco, such as cigarettes and e-cigarettes. If you need help quitting, ask your health care provider. Summary  Having a healthy lifestyle and getting preventive care can help to protect your health and wellness after age 65.  Screening and testing are the best way to find a health problem early and help you avoid having a fall. Early diagnosis and treatment give you the best chance for managing medical conditions that are more common for people who are older than age 65.  Falls are a major cause of broken bones and head injuries in people who are older than age 65. Take precautions to prevent a fall at home.  Work with your health care provider to learn what changes you can make to improve your health and wellness and to prevent falls. This information is not intended  to replace advice given to you by your health care provider. Make sure you discuss any questions you have with your health care provider. Document Revised: 02/19/2019 Document Reviewed: 09/11/2017 Elsevier Patient Education  2020 Elsevier Inc.  

## 2020-10-10 ENCOUNTER — Telehealth: Payer: Self-pay | Admitting: *Deleted

## 2020-10-10 NOTE — Telephone Encounter (Signed)
Left message to schedule AWV 

## 2020-10-11 ENCOUNTER — Ambulatory Visit (INDEPENDENT_AMBULATORY_CARE_PROVIDER_SITE_OTHER): Payer: Medicare HMO | Admitting: Emergency Medicine

## 2020-10-11 VITALS — BP 110/58 | Ht 74.0 in | Wt 200.0 lb

## 2020-10-11 DIAGNOSIS — Z0000 Encounter for general adult medical examination without abnormal findings: Secondary | ICD-10-CM | POA: Diagnosis not present

## 2020-10-11 NOTE — Patient Instructions (Signed)
Thank you for taking time to come for your Medicare Wellness Visit. I appreciate your ongoing commitment to your health goals. Please review the following plan we discussed and let me know if I can assist you in the future.  Julie Greer LPN  Preventive Care 65 Years and Older, Male Preventive care refers to lifestyle choices and visits with your health care provider that can promote health and wellness. This includes:  A yearly physical exam. This is also called an annual well check.  Regular dental and eye exams.  Immunizations.  Screening for certain conditions.  Healthy lifestyle choices, such as diet and exercise. What can I expect for my preventive care visit? Physical exam Your health care provider will check:  Height and weight. These may be used to calculate body mass index (BMI), which is a measurement that tells if you are at a healthy weight.  Heart rate and blood pressure.  Your skin for abnormal spots. Counseling Your health care provider may ask you questions about:  Alcohol, tobacco, and drug use.  Emotional well-being.  Home and relationship well-being.  Sexual activity.  Eating habits.  History of falls.  Memory and ability to understand (cognition).  Work and work environment. What immunizations do I need?  Influenza (flu) vaccine  This is recommended every year. Tetanus, diphtheria, and pertussis (Tdap) vaccine  You may need a Td booster every 10 years. Varicella (chickenpox) vaccine  You may need this vaccine if you have not already been vaccinated. Zoster (shingles) vaccine  You may need this after age 60. Pneumococcal conjugate (PCV13) vaccine  One dose is recommended after age 65. Pneumococcal polysaccharide (PPSV23) vaccine  One dose is recommended after age 65. Measles, mumps, and rubella (MMR) vaccine  You may need at least one dose of MMR if you were born in 1957 or later. You may also need a second dose. Meningococcal  conjugate (MenACWY) vaccine  You may need this if you have certain conditions. Hepatitis A vaccine  You may need this if you have certain conditions or if you travel or work in places where you may be exposed to hepatitis A. Hepatitis B vaccine  You may need this if you have certain conditions or if you travel or work in places where you may be exposed to hepatitis B. Haemophilus influenzae type b (Hib) vaccine  You may need this if you have certain conditions. You may receive vaccines as individual doses or as more than one vaccine together in one shot (combination vaccines). Talk with your health care provider about the risks and benefits of combination vaccines. What tests do I need? Blood tests  Lipid and cholesterol levels. These may be checked every 5 years, or more frequently depending on your overall health.  Hepatitis C test.  Hepatitis B test. Screening  Lung cancer screening. You may have this screening every year starting at age 55 if you have a 30-pack-year history of smoking and currently smoke or have quit within the past 15 years.  Colorectal cancer screening. All adults should have this screening starting at age 50 and continuing until age 75. Your health care provider may recommend screening at age 45 if you are at increased risk. You will have tests every 1-10 years, depending on your results and the type of screening test.  Prostate cancer screening. Recommendations will vary depending on your family history and other risks.  Diabetes screening. This is done by checking your blood sugar (glucose) after you have not eaten for   while (fasting). You may have this done every 1-3 years.  Abdominal aortic aneurysm (AAA) screening. You may need this if you are a current or former smoker.  Sexually transmitted disease (STD) testing. Follow these instructions at home: Eating and drinking  Eat a diet that includes fresh fruits and vegetables, whole grains, lean  protein, and low-fat dairy products. Limit your intake of foods with high amounts of sugar, saturated fats, and salt.  Take vitamin and mineral supplements as recommended by your health care provider.  Do not drink alcohol if your health care provider tells you not to drink.  If you drink alcohol: ? Limit how much you have to 0-2 drinks a day. ? Be aware of how much alcohol is in your drink. In the [place] one drink equals one 12 oz bottle of beer (355 mL), one 5 oz glass of wine (148 mL), or one 1 oz glass of hard liquor (44 mL). Lifestyle  Take daily care of your teeth and gums.  Stay active. Exercise for at least 30 minutes on 5 or more days each week.  Do not use any products that contain nicotine or tobacco, such as cigarettes, e-cigarettes, and chewing tobacco. If you need help quitting, ask your health care provider.  If you are sexually active, practice safe sex. Use a condom or other form of protection to prevent STIs (sexually transmitted infections).  Talk with your health care provider about taking a low-dose aspirin or statin. What's next?  Visit your health care provider once a year for a well check visit.  Ask your health care provider how often you should have your eyes and teeth checked.  Stay up to date on all vaccines. This information is not intended to replace advice given to you by your health care provider. Make sure you discuss any questions you have with your health care provider. Document Revised: 10/23/2018 Document Reviewed: 10/23/2018 Elsevier Patient Education  2020 [employer].

## 2020-10-11 NOTE — Progress Notes (Signed)
Presents today for [employer] Visit   Date of last exam: 09-27-2020  Interpreter used for this visit? No  I connected with  Andrew [name] on 10/11/20 by a telephone application and verified that I am speaking with the correct person using two identifiers.   I discussed the limitations of evaluation and management by telemedicine. The patient expressed understanding and agreed to proceed.  Patient location: home  Provider location: in office  I provided 20 minutes of non face - to - face time during this encounter.  Patient Care Team: Horald Pollen, MD as PCP - General (Internal Medicine) Alyson Ingles Candee Furbish, MD as Consulting Physician (Urology) Nigel Mormon, MD as Consulting Physician (Cardiology) Tanda Rockers, MD as Consulting Physician (Pulmonary Disease)   Other items to address today:   Discussed Eye/Dental Discussed Immunizations Follow up scheduled Dr. Mitchel Honour 5/18 @ 3:00  Patient is going to hold of on Crestor prescription at this time. He will let office know when he starts.      Other Screening: Last screening for diabetes:10-02-2019 Last lipid screening: 10-02-2019  ADVANCE DIRECTIVES: Discussed: yes On File: no Materials Provided: yes  Immunization status:  Immunization History  Administered Date(s) Administered  . Fluad Quad(high Dose 65+) 09/06/2020  . Influenza,inj,Quad PF,6+ Mos 01/24/2016, 09/02/2017  . PFIZER SARS-COV-2 Vaccination 12/18/2019, 01/08/2020  . Pneumococcal Conjugate-13 09/02/2017  . Pneumococcal Polysaccharide-23 03/29/2020  . Tdap 03/29/2020     Health Maintenance Due  Topic Date Due  . Fecal DNA (Cologuard)  Never done     Functional Status Survey: Is the patient deaf or have difficulty hearing?: No Does the patient have difficulty seeing, even when wearing glasses/contacts?: No Does the patient have difficulty concentrating, remembering, or making decisions?: No Does the  patient have difficulty walking or climbing stairs?: No Does the patient have difficulty dressing or bathing?: No Does the patient have difficulty doing errands alone such as visiting a doctor's office or shopping?: No   6CIT Screen 10/11/2020 10/02/2019 09/02/2017  What Year? 0 points 0 points 0 points  What month? 0 points 0 points 0 points  What time? 0 points 0 points 0 points  Count back from 20 0 points 0 points 0 points  Months in reverse 0 points 0 points 0 points  Repeat phrase 0 points 0 points 0 points  Total Score 0 0 0        Clinical Support from 10/11/2020 in [name] at [facility]  AUDIT-C Score 6       Home Environment:   Is in a bowling a league every Saturday and Sunday No trouble climbing stairs Lives in a two story home No scattered rugs No grab bars Adequate lighting/ no clutter Works part-time [employer]   Patient Active Problem List   Diagnosis Date Noted  . Nocturia 07/27/2020  . Acquired thrombophilia ([name]) 05/05/2020  . Prostate cancer ([name]) 01/14/2018  . Carotid artery stenosis, asymptomatic, bilateral 12/06/2017  . Vitamin D deficiency 12/03/2017  . Prediabetes 11/14/2017  . Chronic kidney disease (CKD) stage G3a/A2, moderately decreased glomerular filtration rate (GFR) between 45-59 mL/min/1.73 square meter and albuminuria creatinine ratio between 30-299 mg/g (HCC) 11/13/2017  . Cigarette smoker 08/20/2017  . COPD GOLD 2 still smoking  07/31/2017  . Coronary atherosclerosis of native coronary artery 07/31/2017  . 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 DENTAL CARIES 06/01/2008  . GERD 03/04/2008  . Hyperlipidemia LDL goal <70 01/30/2008  .  Essential hypertension 01/30/2008  . PERIPHERAL VASCULAR DISEASE 01/30/2008     Past Medical History:  Diagnosis Date  . Asthma   . Carotid artery stenosis, asymptomatic, bilateral 12/06/2017   Consider recheck Dopplers in 6 months -> 05/2018 Mild to  moderate bilateral carotid artery stenosis Carotid Dopplers done at [facility] 11/26/2017 show left internal carotid artery stenosis 50-69% with soft plaque.  Mild stenosis in the left common carotid artery <50%. Mild stenosis in the [name] common carotid artery <50% with moderate diffuse soft plaque.  Mild stenosis in the [name] e  . COCAINE ABUSE 01/30/2008   Qualifier: Diagnosis of  By: Amil Amen MD, Benjamine Mola    . COPD (chronic obstructive pulmonary disease) ([name])   . Hypertension      No past surgical history on file.   No family history on file.   Social History   Socioeconomic History  . Marital status: Legally Separated    Spouse name: Not on file  . Number of children: 1  . Years of education: Not on file  . Highest education level: Not on file  Occupational History  . Not on file  Tobacco Use  . Smoking status: Current Some Day Smoker    Packs/day: 0.75    Years: 44.00    Pack years: 33.00    Types: Cigarettes  . Smokeless tobacco: Never Used  . Tobacco comment: 08/04 pack lasts pt for a week (3-4 cigs/daily)  Vaping Use  . Vaping Use: Never used  Substance and Sexual Activity  . Alcohol use: Yes    Comment: occasional  . Drug use: No  . Sexual activity: Not on file  Other Topics Concern  . Not on file  Social History Narrative  . Not on file   Social Determinants of Health   Financial Resource Strain:   . Difficulty of Paying Living Expenses: Not on file  Food Insecurity:   . Worried About Charity fundraiser in the Last Year: Not on file  . Ran Out of Food in the Last Year: Not on file  Transportation Needs:   . Lack of Transportation (Medical): Not on file  . Lack of Transportation (Non-Medical): Not on file  Physical Activity:   . Days of Exercise per Week: Not on file  . Minutes of Exercise per Session: Not on file  Stress:   . Feeling of Stress : Not on file  Social Connections:   . Frequency of Communication with Friends and Family:  Not on file  . Frequency of Social Gatherings with Friends and Family: Not on file  . Attends Religious Services: Not on file  . Active Member of Clubs or Organizations: Not on file  . Attends Archivist Meetings: Not on file  . Marital Status: Not on file  Intimate Partner Violence:   . Fear of Current or Ex-Partner: Not on file  . Emotionally Abused: Not on file  . Physically Abused: Not on file  . Sexually Abused: Not on file     Allergies  Allergen Reactions  . Lisinopril Cough  . Lisinopril-Hydrochlorothiazide     dizziness     Prior to Admission medications   Medication Sig Start Date End Date Taking? Authorizing Provider  albuterol (PROVENTIL HFA;VENTOLIN HFA) 108 (90 Base) MCG/ACT inhaler Inhale 2 puffs into the lungs every 4 (four) hours as needed for wheezing or shortness of breath (cough, shortness of breath or wheezing.). 06/29/17   Shawnee Knapp, MD  albuterol (PROVENTIL) (2.5 MG/3ML)  0.083% nebulizer solution Take 3 mLs (2.5 mg total) by nebulization every 6 (six) hours as needed for wheezing or shortness of breath. 03/29/20   Horald Pollen, MD  alfuzosin (UROXATRAL) 10 MG 24 hr tablet Take 1 tablet (10 mg total) by mouth daily with breakfast. 09/07/20   McKenzie, Candee Furbish, MD  cetirizine (ZYRTEC) 10 MG tablet TAKE 1 TABLET (10 MG TOTAL) BY MOUTH DAILY. 05/05/20   Horald Pollen, MD  Cholecalciferol (VITAMIN D3) 50 MCG (2000 UT) capsule Take 1 capsule (2,000 Units total) by mouth daily. Patient not taking: Reported on 09/27/2020 10/02/19   Forrest Moron, MD  diltiazem (DILT-XR) 240 MG 24 hr capsule Take 1 capsule (240 mg total) by mouth daily. 05/31/20 08/29/20  Horald Pollen, MD  Fluticasone-Salmeterol (ADVAIR) 250-50 MCG/DOSE AEPB Inhale 1 puff into the lungs 2 (two) times daily.     [provider]  olmesartan (BENICAR) 40 MG tablet Take 1 tablet (40 mg total) by mouth daily. 06/27/20   Horald Pollen, MD  rosuvastatin  (CRESTOR) 10 MG tablet Take 1 tablet (10 mg total) by mouth daily. Patient not taking: Reported on 10/11/2020 09/27/20   Horald Pollen, MD  sulfamethoxazole-trimethoprim (BACTRIM DS) 800-160 MG tablet Take 1 tablet by mouth every 12 (twelve) hours. 07/27/20   McKenzie, Candee Furbish, MD  tadalafil (CIALIS) 20 MG tablet Take 1 tablet (20 mg total) by mouth daily as needed for erectile dysfunction. 07/27/20   McKenzie, Candee Furbish, MD  triamcinolone cream (KENALOG) 0.1 % Apply 1 application topically 2 (two) times daily. 03/29/20   Horald Pollen, MD  varenicline (CHANTIX STARTING MONTH PAK) 0.5 MG X 11 & 1 MG X 42 tablet Take one 0.5 mg tablet by mouth once daily for 3 days, then increase to one 0.5 mg tablet twice daily for 4 days, then increase to one 1 mg tablet twice daily. 05/05/20   Horald Pollen, MD     Depression screen [facility] 2/9 10/11/2020 09/27/2020 05/05/2020 03/29/2020 10/02/2019  Decreased Interest 0 0 0 0 0  Down, Depressed, Hopeless 0 0 0 0 0  PHQ - 2 Score 0 0 0 0 0     Fall Risk  10/11/2020 09/27/2020 05/05/2020 03/29/2020 10/02/2019  Falls in the past year? 0 0 0 0 0  Number falls in past yr: 0 0 0 0 0  Injury with Fall? 0 0 0 0 0  Risk for fall due to : - - - - History of fall(s)  Follow up Falls evaluation completed;Education provided - Falls evaluation completed Falls evaluation completed Falls evaluation completed;Education provided      PHYSICAL EXAM: BP (!) 110/58 Comment: taken from a previous visit / not in clinic  Ht 6\' 2"  (1.88 m)   Wt 200 lb (90.7 kg)   BMI 25.68 kg/m    Wt Readings from Last 3 Encounters:  10/11/20 200 lb (90.7 kg)  09/27/20 200 lb (90.7 kg)  09/07/20 200 lb 12.8 oz (91.1 kg)       Education/Counseling provided regarding diet and exercise, prevention of chronic diseases, smoking/tobacco cessation, if applicable, and reviewed "Covered Medicare Preventive Services."

## 2020-10-14 ENCOUNTER — Ambulatory Visit
Admission: RE | Admit: 2020-10-14 | Discharge: 2020-10-14 | Disposition: A | Discharge status: Home / Self Care | Payer: Medicare HMO | Source: Ambulatory Visit | Attending: Acute Care | Admitting: Acute Care

## 2020-10-14 DIAGNOSIS — F17210 Nicotine dependence, cigarettes, uncomplicated: Secondary | ICD-10-CM

## 2020-10-14 DIAGNOSIS — Z87891 Personal history of nicotine dependence: Secondary | ICD-10-CM

## 2020-10-18 NOTE — Progress Notes (Signed)
Please call patient and let them  know their  low dose Ct was read as a Lung RADS 1, negative study: no nodules or definitely benign nodules. Radiology recommendation is for a repeat LDCT in 12 months. .Please let them  know we will order and schedule their  annual screening scan for 10/2021. Please let them  know there was notation of CAD on their  scan.  Please remind the patient  that this is a non-gated exam therefore degree or severity of disease  cannot be determined. Please have them  follow up with their PCP regarding potential risk factor modification, dietary therapy or pharmacologic therapy if clinically indicated. Pt.  is  currently on statin therapy. Please place order for annual  screening scan for  10/2021 and fax results to PCP. Thanks so much.  Langley Gauss, this patient is  not followed by cards. Just have him follow up with PCP. He is on statin therapy.

## 2020-10-20 ENCOUNTER — Telehealth: Payer: Self-pay | Admitting: Internal Medicine

## 2020-10-20 DIAGNOSIS — F17210 Nicotine dependence, cigarettes, uncomplicated: Secondary | ICD-10-CM

## 2020-10-20 DIAGNOSIS — Z87891 Personal history of nicotine dependence: Secondary | ICD-10-CM

## 2020-10-20 NOTE — Telephone Encounter (Signed)
This patient returned your call.

## 2020-10-20 NOTE — Telephone Encounter (Signed)
LMTC  X 1  

## 2020-10-24 NOTE — Telephone Encounter (Signed)
LMTC x 1  

## 2020-10-27 ENCOUNTER — Encounter: Payer: Self-pay | Admitting: *Deleted

## 2020-10-27 NOTE — Telephone Encounter (Signed)
Letter mailed to pt with  CT results per Eric Form, NP.    Copy sent to PCP.  Order placed for 1 yr f/u CT.

## 2020-11-19 ENCOUNTER — Emergency Department (HOSPITAL_COMMUNITY)
Admission: EM | Admit: 2020-11-19 | Discharge: 2020-11-19 | Disposition: A | Discharge status: Home / Self Care | Payer: Medicare HMO | Attending: Emergency Medicine | Admitting: Emergency Medicine

## 2020-11-19 ENCOUNTER — Encounter (HOSPITAL_COMMUNITY): Payer: Self-pay | Admitting: Emergency Medicine

## 2020-11-19 ENCOUNTER — Other Ambulatory Visit: Payer: Self-pay

## 2020-11-19 ENCOUNTER — Emergency Department (HOSPITAL_COMMUNITY): Payer: Medicare HMO

## 2020-11-19 DIAGNOSIS — Z8546 Personal history of malignant neoplasm of prostate: Secondary | ICD-10-CM | POA: Diagnosis not present

## 2020-11-19 DIAGNOSIS — R059 Cough, unspecified: Secondary | ICD-10-CM | POA: Insufficient documentation

## 2020-11-19 DIAGNOSIS — N183 Chronic kidney disease, stage 3 unspecified: Secondary | ICD-10-CM | POA: Insufficient documentation

## 2020-11-19 DIAGNOSIS — J449 Chronic obstructive pulmonary disease, unspecified: Secondary | ICD-10-CM | POA: Diagnosis not present

## 2020-11-19 DIAGNOSIS — Z79899 Other long term (current) drug therapy: Secondary | ICD-10-CM | POA: Diagnosis not present

## 2020-11-19 DIAGNOSIS — J45909 Unspecified asthma, uncomplicated: Secondary | ICD-10-CM | POA: Diagnosis not present

## 2020-11-19 DIAGNOSIS — J441 Chronic obstructive pulmonary disease with (acute) exacerbation: Secondary | ICD-10-CM | POA: Diagnosis not present

## 2020-11-19 DIAGNOSIS — Z20822 Contact with and (suspected) exposure to covid-19: Secondary | ICD-10-CM | POA: Diagnosis not present

## 2020-11-19 DIAGNOSIS — F17210 Nicotine dependence, cigarettes, uncomplicated: Secondary | ICD-10-CM | POA: Insufficient documentation

## 2020-11-19 DIAGNOSIS — R0602 Shortness of breath: Secondary | ICD-10-CM | POA: Insufficient documentation

## 2020-11-19 DIAGNOSIS — Z7951 Long term (current) use of inhaled steroids: Secondary | ICD-10-CM | POA: Insufficient documentation

## 2020-11-19 DIAGNOSIS — I129 Hypertensive chronic kidney disease with stage 1 through stage 4 chronic kidney disease, or unspecified chronic kidney disease: Secondary | ICD-10-CM | POA: Diagnosis not present

## 2020-11-19 LAB — RESP PANEL BY RT-PCR (FLU A&B, COVID) ARPGX2
Influenza A by PCR: NEGATIVE
Influenza B by PCR: NEGATIVE
SARS Coronavirus 2 by RT PCR: NEGATIVE

## 2020-11-19 MED ORDER — PREDNISONE 20 MG PO TABS: 20.0000 mg | ORAL_TABLET | Freq: Every day | ORAL | 0 refills | Status: AC

## 2020-11-19 MED ORDER — PREDNISONE 20 MG PO TABS
60.0000 mg | ORAL_TABLET | Freq: Once | ORAL | Status: AC
  Administered 2020-11-19: 60 mg via ORAL
  Filled 2020-11-19: qty 3

## 2020-11-19 MED ORDER — IPRATROPIUM-ALBUTEROL 0.5-2.5 (3) MG/3ML IN SOLN
3.0000 mL | Freq: Once | RESPIRATORY_TRACT | Status: AC
  Administered 2020-11-19: 3 mL via RESPIRATORY_TRACT
  Filled 2020-11-19: qty 3

## 2020-11-19 MED ORDER — PREDNISONE 20 MG PO TABS: 20.0000 mg | ORAL_TABLET | Freq: Every day | ORAL | 0 refills | Status: DC

## 2020-11-19 NOTE — ED Triage Notes (Signed)
C/o SOB and cough with clear sputum x 1 week.

## 2020-11-19 NOTE — Discharge Instructions (Addendum)
You have been seen and discharged from the emergency department.  Follow-up with your primary provider for reevaluation. Take new prescriptions of steroid and use home inhaler/nebulizer/home medications as prescribed. If you have any worsening symptoms, chest pain, difficulty breathing or further concerns for health please return to an emergency department for further evaluation.

## 2020-11-19 NOTE — ED Notes (Signed)
Pt placed on O2 via [place]. Pt states improvement in SOB symptoms.. O2 Sat from 93 to 96%. Pt states Hx of COPD; tech did not continue to titrate O2 up.

## 2020-11-19 NOTE — ED Provider Notes (Signed)
[facility] EMERGENCY DEPARTMENT Provider Note   CSN: [id number] Arrival date & time: 11/19/20  6010     History Chief Complaint  Patient presents with  . Shortness of Breath    CONGESTION     Andrew [name] is a 70 y.o. male.  HPI   70 year old male with past medical history of COPD on supplemental oxygen, HTN, CKD presents to the emergency department with wheezing and cough with clear sputum. He states his symptoms have been going on for about a week. He sometimes has these "exacerbations". He usually does not use his nebulizer at home, he used it yesterday with mild improvement. He denies any fever or other acute infectious symptoms. Has never had any hemoptysis, no chest pain or shortness of breath. He states he is intermittently wheezing. Not currently on any steroids. No swelling of his lower extremities. Otherwise eating/drinking and taking his medications as prescribed.  Past Medical History:  Diagnosis Date  . Asthma   . Carotid artery stenosis, asymptomatic, bilateral 12/06/2017   Consider recheck Dopplers in 6 months -> 05/2018 Mild to moderate bilateral carotid artery stenosis Carotid Dopplers done at [facility] 11/26/2017 show left internal carotid artery stenosis 50-69% with soft plaque.  Mild stenosis in the left common carotid artery <50%. Mild stenosis in the right common carotid artery <50% with moderate diffuse soft plaque.  Mild stenosis in the right e  . COCAINE ABUSE 01/30/2008   Qualifier: Diagnosis of  By: Amil Amen MD, Benjamine Mola    . COPD (chronic obstructive pulmonary disease) ([place])   . Hypertension     Patient Active Problem List   Diagnosis Date Noted  . Nocturia 07/27/2020  . Acquired thrombophilia ([place]) 05/05/2020  . Prostate cancer ([place]) 01/14/2018  . Carotid artery stenosis, asymptomatic, bilateral 12/06/2017  . Vitamin D deficiency 12/03/2017  . Prediabetes 11/14/2017  . Chronic kidney disease (CKD) stage G3a/A2,  moderately decreased glomerular filtration rate (GFR) between 45-59 mL/min/1.73 square meter and albuminuria creatinine ratio between 30-299 mg/g (HCC) 11/13/2017  . Cigarette smoker 08/20/2017  . COPD GOLD 2 still smoking  07/31/2017  . Coronary atherosclerosis of native coronary artery 07/31/2017  . 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 DENTAL CARIES 06/01/2008  . GERD 03/04/2008  . Hyperlipidemia LDL goal <70 01/30/2008  . Essential hypertension 01/30/2008  . PERIPHERAL VASCULAR DISEASE 01/30/2008    History reviewed. No pertinent surgical history.     No family history on file.  Social History   Tobacco Use  . Smoking status: Current Some Day Smoker    Packs/day: 0.75    Years: 44.00    Pack years: 33.00    Types: Cigarettes  . Smokeless tobacco: Never Used  . Tobacco comment: 08/04 pack lasts pt for a week (3-4 cigs/daily)  Vaping Use  . Vaping Use: Never used  Substance Use Topics  . Alcohol use: Yes    Comment: occasional  . Drug use: No    Home Medications Prior to Admission medications   Medication Sig Start Date End Date Taking? Authorizing Provider  albuterol (PROVENTIL HFA;VENTOLIN HFA) 108 (90 Base) MCG/ACT inhaler Inhale 2 puffs into the lungs every 4 (four) hours as needed for wheezing or shortness of breath (cough, shortness of breath or wheezing.). 06/29/17   Shawnee Knapp, MD  albuterol (PROVENTIL) (2.5 MG/3ML) 0.083% nebulizer solution Take 3 mLs (2.5 mg total) by nebulization every 6 (six) hours as needed for wheezing or shortness of breath. 03/29/20  Horald Pollen, MD  alfuzosin (UROXATRAL) 10 MG 24 hr tablet Take 1 tablet (10 mg total) by mouth daily with breakfast. 09/07/20   McKenzie, Candee Furbish, MD  cetirizine (ZYRTEC) 10 MG tablet TAKE 1 TABLET (10 MG TOTAL) BY MOUTH DAILY. 05/05/20   Horald Pollen, MD  Cholecalciferol (VITAMIN D3) 50 MCG (2000 UT) capsule Take 1 capsule (2,000 Units total) by mouth  daily. Patient not taking: Reported on 09/27/2020 10/02/19   Forrest Moron, MD  diltiazem (DILT-XR) 240 MG 24 hr capsule Take 1 capsule (240 mg total) by mouth daily. 05/31/20 08/29/20  Horald Pollen, MD  Fluticasone-Salmeterol (ADVAIR) 250-50 MCG/DOSE AEPB Inhale 1 puff into the lungs 2 (two) times daily.     [provider]  olmesartan (BENICAR) 40 MG tablet Take 1 tablet (40 mg total) by mouth daily. 06/27/20   Horald Pollen, MD  rosuvastatin (CRESTOR) 10 MG tablet Take 1 tablet (10 mg total) by mouth daily. Patient not taking: Reported on 10/11/2020 09/27/20   Horald Pollen, MD  sulfamethoxazole-trimethoprim (BACTRIM DS) 800-160 MG tablet Take 1 tablet by mouth every 12 (twelve) hours. 07/27/20   McKenzie, Candee Furbish, MD  tadalafil (CIALIS) 20 MG tablet Take 1 tablet (20 mg total) by mouth daily as needed for erectile dysfunction. 07/27/20   McKenzie, Candee Furbish, MD  triamcinolone cream (KENALOG) 0.1 % Apply 1 application topically 2 (two) times daily. 03/29/20   Horald Pollen, MD  varenicline (CHANTIX STARTING MONTH PAK) 0.5 MG X 11 & 1 MG X 42 tablet Take one 0.5 mg tablet by mouth once daily for 3 days, then increase to one 0.5 mg tablet twice daily for 4 days, then increase to one 1 mg tablet twice daily. 05/05/20   Horald Pollen, MD    Allergies    Lisinopril and Lisinopril-hydrochlorothiazide  Review of Systems   Review of Systems  Constitutional: Negative for chills and fever.  HENT: Negative for congestion.   Eyes: Negative for visual disturbance.  Respiratory: Positive for cough and wheezing. Negative for chest tightness and shortness of breath.   Cardiovascular: Negative for chest pain, palpitations and leg swelling.  Gastrointestinal: Negative for abdominal pain, diarrhea and vomiting.  Genitourinary: Negative for dysuria.  Skin: Negative for rash.  Neurological: Negative for headaches.    Physical Exam Updated Vital Signs BP  (!) 145/94   Pulse 71   Temp (!) 97.5 F (36.4 C) (Oral)   Resp 17   SpO2 93%   Physical Exam Vitals and nursing note reviewed.  Constitutional:      Appearance: Normal appearance.  HENT:     Head: Normocephalic.     Mouth/Throat:     Mouth: Mucous membranes are moist.  Cardiovascular:     Rate and Rhythm: Normal rate.  Pulmonary:     Effort: Pulmonary effort is normal. No respiratory distress.     Breath sounds: Examination of the right-lower field reveals decreased breath sounds. Examination of the left-lower field reveals decreased breath sounds. Decreased breath sounds and wheezing present.  Chest:     Chest wall: No tenderness or crepitus.  Abdominal:     Palpations: Abdomen is soft.     Tenderness: There is no abdominal tenderness.  Musculoskeletal:     Right lower leg: No edema.     Left lower leg: No edema.  Skin:    General: Skin is warm.  Neurological:     Mental Status: He is alert and oriented to  person, place, and time. Mental status is at baseline.  Psychiatric:        Mood and Affect: Mood normal.     ED Results / Procedures / Treatments   Labs (all labs ordered are listed, but only abnormal results are displayed) Labs Reviewed  RESP PANEL BY RT-PCR (FLU A&B, COVID) [id number]    EKG EKG Interpretation  Date/Time:  Saturday November 19 2020 10:32:34 EST Ventricular Rate:  73 PR Interval:  204 QRS Duration: 88 QT Interval:  370 QTC Calculation: 407 R Axis:   73 Text Interpretation: Normal sinus rhythm Septal infarct , age undetermined ST & T wave abnormality, consider inferolateral ischemia Abnormal ECG NSR, nonspecific T wave changes, present on previous, STE present on previous Confirmed by Lavenia Atlas [phone]) on 11/19/2020 5:25:24 PM   Radiology DG Chest Portable 1 View  Result Date: 11/19/2020 CLINICAL DATA:  COPD EXAM: PORTABLE CHEST 1 VIEW COMPARISON:  October 14, 2020 FINDINGS: The cardiomediastinal silhouette is unchanged in  contour.Atherosclerotic calcifications of the aorta. No pleural effusion. No pneumothorax. No acute pleuroparenchymal abnormality. Visualized abdomen is unremarkable. Mild degenerative changes of the thoracic spine. IMPRESSION: No acute cardiopulmonary abnormality. Electronically Signed   By: Valentino Saxon MD   On: 11/19/2020 11:19    Procedures Procedures (including critical care time)  Medications Ordered in ED Medications  ipratropium-albuterol (DUONEB) 0.5-2.5 (3) MG/3ML nebulizer solution 3 mL (3 mLs Nebulization Given 11/19/20 1829)  predniSONE (DELTASONE) tablet 60 mg (60 mg Oral Given 11/19/20 1829)    ED Course  I have reviewed the triage vital signs and the nursing notes.  Pertinent labs & imaging results that were available during my care of the patient were reviewed by me and considered in my medical decision making (see chart for details).    MDM Rules/Calculators/A&P                          Male presents the emergency department with cough productive of white sputum with wheezing.  No fever or other infectious symptoms.  No chest pain or findings of fluid overload.  He has bilateral wheezing on exam.  Chest x-ray is reassuring, COVID test is negative.  Vitals are stable on his baseline home oxygen requirement.  After dose of steroids and a breathing treatment his lung auscultation has significantly improved, he feels better.  He has been ambulatory and conversational without any respiratory distress.  Without any chest pain or actual shortness of breath I have low suspicion for cardiac etiology.  EKG shows nonspecific ST wave changes that have been present on previous.  He is well-appearing, has his inhaler nebulizers at home.  Will be sent home with a short steroid protocol.  Patient will be discharged and treated as an outpatient.  Discharge plan and strict return to ED precautions discussed, patient verbalizes understanding and agreement.  Final Clinical Impression(s) / ED  Diagnoses Final diagnoses:  None    Rx / DC Orders ED Discharge Orders    None       Lorelle Gibbs, DO 11/19/20 1959

## 2020-11-22 ENCOUNTER — Telehealth: Payer: Self-pay | Admitting: *Deleted

## 2020-11-22 ENCOUNTER — Encounter: Payer: Self-pay | Admitting: Internal Medicine

## 2020-11-22 NOTE — Telephone Encounter (Signed)
Schedule Hospital Follow up

## 2020-12-01 ENCOUNTER — Encounter: Payer: Self-pay | Admitting: Internal Medicine

## 2020-12-01 ENCOUNTER — Ambulatory Visit (INDEPENDENT_AMBULATORY_CARE_PROVIDER_SITE_OTHER): Payer: Medicare HMO | Admitting: Internal Medicine

## 2020-12-01 ENCOUNTER — Other Ambulatory Visit: Payer: Self-pay

## 2020-12-01 DIAGNOSIS — J449 Chronic obstructive pulmonary disease, unspecified: Secondary | ICD-10-CM | POA: Diagnosis not present

## 2020-12-01 MED ORDER — BUDESONIDE-FORMOTEROL FUMARATE 160-4.5 MCG/ACT IN AERO: INHALATION_SPRAY | RESPIRATORY_TRACT | 3 refills | Status: DC

## 2020-12-01 MED ORDER — BREZTRI AEROSPHERE 160-9-4.8 MCG/ACT IN AERO: 2.0000 | INHALATION_SPRAY | Freq: Two times a day (BID) | RESPIRATORY_TRACT | 0 refills | Status: DC

## 2020-12-01 NOTE — Addendum Note (Signed)
Addended by: Christinia Gully B on: 12/01/2020 02:24 PM   Modules accepted: Orders

## 2020-12-01 NOTE — Addendum Note (Signed)
Addended by: Merrilee Seashore on: 12/01/2020 04:53 PM   Modules accepted: Orders

## 2020-12-01 NOTE — Assessment & Plan Note (Signed)
Quit smoking Nov 14 2020 07/19/17 CT Mild centrilobular emphysematous changes, upper lobe predominant. - Spirometry 09/20/2017  FEV1 1.45 (42%)  Ratio 50 with typical curvature > dulera 200 (humana) 2bid   -  PFT's  12/09/2017  FEV1 2.38 (72 % ) ratio 60  p 1 % improvement from saba p symbicort 160 prior to study with DLCO  62/66 % corrects to 71  % for alv volume   - 12/09/2017   continue dulera 200 or symb 160 depending on insurance  06/16/2019  After extensive coaching inhaler device,  effectiveness =   90%  - 12/01/2020  After extensive coaching inhaler device,  effectiveness =    75% (short ti)     Group D in terms of symptom/risk and laba/lama/ICS  therefore appropriate rx at this point >>>  breztri best option but doesn't think he can afford it so will just try for generic symbicort 1602 bid and ask pt to return for f/u with formulary in hand if can't afford this as clearly broke thru wixella (given one sample of breztri until he sorts out affordability           Each maintenance medication was reviewed in detail including emphasizing most importantly the difference between maintenance and prns and under what circumstances the prns are to be triggered using an action plan format where appropriate.  Total time for H and P, chart review, counseling, reviewing hfa* device(s) and generating customized AVS unique to this office visit / same day charting  > 30 min

## 2020-12-01 NOTE — Patient Instructions (Addendum)
Plan A = Automatic = symbiocort 160  Take 2 puffs first thing in am and then another 2 puffs about 12 hours later.   Work on inhaler technique:  relax and gently blow all the way out then take a nice smooth deep breath back in, triggering the inhaler at same time you start breathing in.  Hold for up to 5 seconds if you can. Blow out thru nose. Rinse and gargle with water when done  Prednisone take one half daily until gone    Plan B = Backup Only use your albuterol as a rescue medication to be used if you can't catch your breath by resting or doing a relaxed purse lip breathing pattern.  - The less you use it, the better it will work when you need it. - Ok to use the inhaler up to 2 puffs  every 4 hours if you must but call for appointment if use goes up over your usual need - Don't leave home without it !!  (think of it like the spare tire for your car)   I spent extra time with pt today reviewing appropriate use of albuterol for prn use on exertion with the following points: 1) saba is for relief of sob that does not improve by walking a slower pace or resting but rather if the pt does not improve after trying this first. 2) If the pt is convinced, as many are, that saba helps recover from activity faster then it's easy to tell if this is the case by re-challenging : ie stop, take the inhaler, then p 5 minutes try the exact same activity (intensity of workload) that just caused the symptoms and see if they are substantially diminished or not after saba 3) if there is an activity that reproducibly causes the symptoms, try the saba 15 min before the activity on alternate days   If in fact the saba really does help, then fine to continue to use it prn but advised may need to look closer at the maintenance regimen being used to achieve better control of airways disease with exertion.   Plan C = Crisis - only use your albuterol nebulizer if you first try Plan B and it fails to help > ok to use the  nebulizer up to every 4 hours but if start needing it regularly call for immediate appointment   Plan D = Doctor - call me if B and C not adequate  Plan E = ER - go to ER or call 911 if all else fails      I very strongly recommend you get the  pfizer booster vaccine as soon as possible based on your risk of dying from the virus  and the proven safety and benefit of these vaccines against even the delta and omicron variants.  This can save your life as well as  those of your loved ones,  especially if they are also not vaccinated.      Please schedule a follow up visit in 3 months but call sooner if needed with pfts and your drug formulary

## 2020-12-01 NOTE — Progress Notes (Signed)
Subjective:     Patient ID: Andrew [name], male   DOB: March 01, 1951,    MRN: [id number]     Brief patient profile:  60 yobm quit smoking Nov 14 2020 referred to pulmonary clinic 09/20/2017 by Dr   Ranee Gosselin for copd eval with GOLD II criteria documented 12/09/17    History of Present Illness  09/20/2017 1st [facility] office visit/ Milanna Kozlov   Chief Complaint  Patient presents with  . Pulmonary Consult    Referred by [name] for eval of COPD. Pt states his breathing is currently at baseline. He uses an albuterol inhaler 2-3 x per wk and has atrovent and albuterol nebs that he rarely uses.    exac 07/21/17 did not req admit with rec stating on Breo 200 denies taking too expensive so started symb ? Strength p er rx but costs 300 per month  Baseline doe now on rx = best days Not limited by breathing from desired activities lots of fast pace walking at [employer]/ some hills ok  rec Plan A = Automatic = symbiocort 160=dulera 200 Take 2 puffs first thing in am and then another 2 puffs about 12 hours later.  Work on inhaler technique:  relax and gently blow all the way out then take a nice smooth deep breath back in, triggering the inhaler at same time you start breathing in.  Hold for up to 5 seconds if you can. Blow out thru nose. Rinse and gargle with water when done Plan B = Backup Only use your albuterol as a rescue medication Plan C = Crisis - only use your albuterol nebulizer if you first try Plan B     12/09/2017  f/u ov/Sianni Cloninger re: COPD actually gold II with reversibility/ still smoking  Chief Complaint  Patient presents with  . Follow-up    follow up for COPD.   Dyspnea:   Improved:  MMRC1 = can walk nl pace, flat grade, can't hurry or go uphills or steps s sob   Cough: no Sleep: no No need for albuterol  rec No change in medications  Symbicort 160 = dulera 200 Take 2 puffs first thing in am and then another 2 puffs about 12 hours later.  The key is to stop  smoking completely before smoking completely stops you - it's not too late!    07/08/2018  f/u ov/Franco Duley re:  COPD  GOLD II with reversiblity  Chief Complaint  Patient presents with  . Follow-up    6 month follow up. Patient states he does still have a cough. Productive cough with clear mucus. Also has some post nasal drip. States he will develop a runny nose.   Dyspnea:  Not limited by breathing from desired activities   Cough: mostly in am / clear mucus x 4y s change assoc with pnds sensation no better with zyrtec or atrovent  Sleeping: 10 degrees on 4 pillows  SABA use: once a week 02: none  rec Please remember to go to the lab department downstairs in the basement  for your tests - we    Keep your appt for the lung cancer screening planned > did not do See if dulera is more affordable otherwise continue symb 160    06/16/2019  f/u ov/Thera Basden re: GOLD II with reversibility / still smoking / maint on symb 160 2bid Chief Complaint  Patient presents with  . Follow-up    F/U for COPD, pt reports usual DOE & chronic cough w/  clear mucus and runny nose, reports taking Mucinex once daily; reports taking Symbicort daily (unsure of strength)  Dyspnea:  MMRC1 = can walk nl pace, flat grade, can't hurry or go uphills or steps s sob   Cough: mostly in am x 5 y s change/ better on mucinex  Sleeping: on back with 4 pillows SABA use: none 02: none  rec LCS  > neg    09/06/2020  f/u ov/Mohsen Odenthal re: GOLD II with reversibility / maint on advair 250 one bid  Chief Complaint  Patient presents with  . Follow-up    1 year rov copd.  pt doing well, notes occasional DOE but no other complaints.     Dyspnea:  MMRC1 = can walk nl pace, flat grade, can't hurry or go uphills or steps s sob   Cough: none  Sleeping: ok flat SABA use: one or twice a week, no neb 02: none   We will be calling to schedule your CT Chest (as part of the lung cancer screening program)  The key is to stop smoking completely before  smoking completely stops you!     Got booster 11/16/20 and by 11/17/20 had to use neb and could not use wixella > to er 11/19/20 with dx aecopd better p neb and 60 mg pred so no admit   12/01/2020  f/u ov/Enas Winchel re: GOLD II  Chief Complaint  Patient presents with  . Follow-up    Productive cough at night with clear phlegm   Dyspnea: MMRC2 = can't walk a nl pace on a flat grade s sob but does fine slow and flat  Cough: min nocturnal/ mucoid  Sleeping: flat bed with pillows = baseline  SABA use: 2 x daily  02: none    No obvious day to day or daytime variability or assoc excess/ purulent sputum or mucus plugs or hemoptysis or cp or chest tightness, subjective wheeze or overt sinus or hb symptoms.    Also denies any obvious fluctuation of symptoms with weather or environmental changes or other aggravating or alleviating factors except as outlined above   No unusual exposure hx or h/o childhood pna/ asthma or knowledge of premature birth.  Current Allergies, Complete Past Medical History, Past Surgical History, Family History, and Social History were reviewed in [employer] record.  ROS  The following are not active complaints unless bolded Hoarseness, sore throat, dysphagia, dental problems, itching, sneezing,  nasal congestion or discharge of excess mucus or purulent secretions, ear ache,   fever, chills, sweats, unintended wt loss or wt gain, classically pleuritic or exertional cp,  orthopnea pnd or arm/hand swelling  or leg swelling, presyncope, palpitations, abdominal pain, anorexia, nausea, vomiting, diarrhea  or change in bowel habits or change in bladder habits, change in stools or change in urine, dysuria, hematuria,  rash, arthralgias, visual complaints, headache, numbness, weakness or ataxia or problems with walking or coordination,  change in mood or  memory.        Current Meds  Medication Sig  . albuterol (PROVENTIL HFA;VENTOLIN HFA) 108 (90 Base) MCG/ACT  inhaler Inhale 2 puffs into the lungs every 4 (four) hours as needed for wheezing or shortness of breath (cough, shortness of breath or wheezing.).  Marland Kitchen albuterol (PROVENTIL) (2.5 MG/3ML) 0.083% nebulizer solution Take 3 mLs (2.5 mg total) by nebulization every 6 (six) hours as needed for wheezing or shortness of breath.  . alfuzosin (UROXATRAL) 10 MG 24 hr tablet Take 1 tablet (10 mg total) by  mouth daily with breakfast.  . cetirizine (ZYRTEC) 10 MG tablet TAKE 1 TABLET (10 MG TOTAL) BY MOUTH DAILY.  Marland Kitchen diltiazem (DILT-XR) 240 MG 24 hr capsule Take 1 capsule (240 mg total) by mouth daily.  . Fluticasone-Salmeterol (ADVAIR) 250-50 MCG/DOSE AEPB Inhale 1 puff into the lungs 2 (two) times daily.   Marland Kitchen olmesartan (BENICAR) 40 MG tablet Take 1 tablet (40 mg total) by mouth daily.  . predniSONE (DELTASONE) 20 MG tablet Take 20 mg by mouth daily with breakfast.  . sulfamethoxazole-trimethoprim (BACTRIM DS) 800-160 MG tablet Take 1 tablet by mouth every 12 (twelve) hours.  . tadalafil (CIALIS) 20 MG tablet Take 1 tablet (20 mg total) by mouth daily as needed for erectile dysfunction.  . triamcinolone cream (KENALOG) 0.1 % Apply 1 application topically 2 (two) times daily.                               Objective:   Physical Exam   12/01/2020       201 09/06/2020    200  06/16/2019        194  07/08/2018       204  12/09/2017       207   09/20/17 202 lb (91.6 kg)  09/19/17 202 lb (91.6 kg)  09/03/17 201 lb 3.2 oz (91.3 kg)     Vital signs reviewed  12/01/2020  - Note at rest 02 sats  99% on RA   General appearance:    amb bm nad    HEENT: Upper plate/ lower partial     HEENT : pt wearing mask not removed for exam due to covid - 19 concerns.    NECK :  without JVD/Nodes/TM/ nl carotid upstrokes bilaterally   LUNGS: no acc muscle use,  Mild barrel  contour chest wall with bilateral  Distant  Mid/late wheeze and  without cough on insp or exp maneuvers  and mild  Hyperresonant  to   percussion bilaterally     CV:  RRR  no s3 or murmur or increase in P2, and no edema   ABD:  soft and nontender with pos end  insp Hoover's  in the supine position. No bruits or organomegaly appreciated, bowel sounds nl  MS:   Nl gait/  ext warm without deformities, calf tenderness, cyanosis or clubbing No obvious joint restrictions   SKIN: warm and dry without lesions    NEURO:  alert, approp, nl sensorium with  no motor or cerebellar deficits apparent.         I personally reviewed images and agree with radiology impression as follows:   Chest CT12/3/21  LCS  RADS 1/ negative       Assessment:

## 2020-12-01 NOTE — Assessment & Plan Note (Addendum)
Counseled re importance of smoking cessation but did not meet time criteria for separate billing    Encouraged to continue f/u thru the lung cancer screening program yearly in November 2021          Each maintenance medication was reviewed in detail including emphasizing most importantly the difference between maintenance and prns and under what circumstances the prns are to be triggered using an action plan format where appropriate.  Total time for H and P, chart review, counseling, teaching device and generating customized AVS unique to this office visit / charting = 10 min

## 2020-12-05 DIAGNOSIS — C61 Malignant neoplasm of prostate: Secondary | ICD-10-CM | POA: Diagnosis not present

## 2020-12-05 DIAGNOSIS — D631 Anemia in chronic kidney disease: Secondary | ICD-10-CM | POA: Diagnosis not present

## 2020-12-05 DIAGNOSIS — R7303 Prediabetes: Secondary | ICD-10-CM | POA: Diagnosis not present

## 2020-12-05 DIAGNOSIS — N2581 Secondary hyperparathyroidism of renal origin: Secondary | ICD-10-CM | POA: Diagnosis not present

## 2020-12-05 DIAGNOSIS — I129 Hypertensive chronic kidney disease with stage 1 through stage 4 chronic kidney disease, or unspecified chronic kidney disease: Secondary | ICD-10-CM | POA: Diagnosis not present

## 2020-12-05 DIAGNOSIS — N1831 Chronic kidney disease, stage 3a: Secondary | ICD-10-CM | POA: Diagnosis not present

## 2020-12-05 DIAGNOSIS — N189 Chronic kidney disease, unspecified: Secondary | ICD-10-CM | POA: Diagnosis not present

## 2020-12-15 DIAGNOSIS — H5203 Hypermetropia, bilateral: Secondary | ICD-10-CM | POA: Diagnosis not present

## 2020-12-24 ENCOUNTER — Other Ambulatory Visit: Payer: Self-pay | Admitting: Emergency Medicine

## 2020-12-28 ENCOUNTER — Other Ambulatory Visit: Payer: Self-pay | Admitting: *Deleted

## 2020-12-28 DIAGNOSIS — I10 Essential (primary) hypertension: Secondary | ICD-10-CM

## 2020-12-28 MED ORDER — OLMESARTAN MEDOXOMIL 40 MG PO TABS: 40.0000 mg | ORAL_TABLET | Freq: Every day | ORAL | 3 refills | Status: DC

## 2020-12-30 ENCOUNTER — Encounter: Payer: Medicare HMO | Admitting: Internal Medicine

## 2021-01-09 ENCOUNTER — Other Ambulatory Visit: Payer: Self-pay

## 2021-01-09 ENCOUNTER — Ambulatory Visit (INDEPENDENT_AMBULATORY_CARE_PROVIDER_SITE_OTHER): Payer: Medicare HMO | Admitting: Urology

## 2021-01-09 ENCOUNTER — Encounter: Payer: Self-pay | Admitting: Urology

## 2021-01-09 VITALS — BP 157/72 | HR 72 | Temp 97.9°F | Ht 73.0 in | Wt 200.0 lb

## 2021-01-09 DIAGNOSIS — R351 Nocturia: Secondary | ICD-10-CM | POA: Diagnosis not present

## 2021-01-09 DIAGNOSIS — N138 Other obstructive and reflux uropathy: Secondary | ICD-10-CM

## 2021-01-09 DIAGNOSIS — N401 Enlarged prostate with lower urinary tract symptoms: Secondary | ICD-10-CM | POA: Diagnosis not present

## 2021-01-09 DIAGNOSIS — C61 Malignant neoplasm of prostate: Secondary | ICD-10-CM

## 2021-01-09 LAB — URINALYSIS, ROUTINE W REFLEX MICROSCOPIC
Bilirubin, UA: NEGATIVE
Glucose, UA: NEGATIVE
Ketones, UA: NEGATIVE
Leukocytes,UA: NEGATIVE
Nitrite, UA: NEGATIVE
Protein,UA: NEGATIVE
RBC, UA: NEGATIVE
Specific Gravity, UA: 1.015 (ref 1.005–1.030)
Urobilinogen, Ur: 0.2 mg/dL (ref 0.2–1.0)
pH, UA: 5 (ref 5.0–7.5)

## 2021-01-09 MED ORDER — TADALAFIL 20 MG PO TABS: 20.0000 mg | ORAL_TABLET | Freq: Every day | ORAL | 5 refills | Status: DC | PRN

## 2021-01-09 MED ORDER — ALFUZOSIN HCL ER 10 MG PO TB24: 10.0000 mg | ORAL_TABLET | Freq: Every day | ORAL | 3 refills | Status: DC

## 2021-01-09 NOTE — Progress Notes (Unsigned)
01/09/2021 4:04 PM   Andrew Andrew [name] 10-12-1951 [id number]  Referring provider: Horald Pollen, MD [place],   [id number]  followup prostate cancer and BPH   HPI: Andrew [name] is a 70yo here for followup for prostate cancer, ED and BPH. No recent PSA. He uses tadalafil 20mg  prn with good results. He is not bothered by his erectile dysfunction. He has stable mild LUTS on uroxatral 10mg  qhs. IPSS 5 with QOL 1. No other complaints today.   PMH: Past Medical History:  Diagnosis Date  . Asthma   . Carotid artery stenosis, asymptomatic, bilateral 12/06/2017   Consider recheck Dopplers in 6 months -> 05/2018 Mild to moderate bilateral carotid artery stenosis Carotid Dopplers done at [facility] cardiovascular 11/26/2017 show left internal carotid artery stenosis 50-69% with soft plaque.  Mild stenosis in the left common carotid artery <50%. Mild stenosis in the Andrew [name] common carotid artery <50% with moderate diffuse soft plaque.  Mild stenosis in the Andrew [name] Andrew [name]  . COCAINE ABUSE 01/30/2008   Qualifier: Diagnosis of  By: Amil Amen MD, Andrew Andrew [name]    . COPD (chronic obstructive pulmonary disease) ([place])   . Hypertension     Surgical History: No past surgical history on file.  Home Medications:  Allergies as of 01/09/2021      Reactions   Lisinopril Cough   Lisinopril-hydrochlorothiazide    dizziness      Medication List       Accurate as of January 09, 2021  4:04 PM. If you have any questions, ask your nurse or doctor.        albuterol 108 (90 Base) MCG/ACT inhaler Commonly known as: VENTOLIN HFA Inhale 2 puffs into the lungs every 4 (four) hours as needed for wheezing or shortness of breath (cough, shortness of breath or wheezing.).   albuterol (2.5 MG/3ML) 0.083% nebulizer solution Commonly known as: PROVENTIL Take 3 mLs (2.5 mg total) by nebulization every 6 (six) hours as needed for wheezing or shortness of breath.   alfuzosin 10 MG 24 hr tablet Commonly known  as: UROXATRAL Take 1 tablet (10 mg total) by mouth daily with breakfast.   Breztri Aerosphere 160-9-4.8 MCG/ACT Aero Generic drug: Budeson-Glycopyrrol-Formoterol Inhale 2 puffs into the lungs in the morning and at bedtime.   budesonide-formoterol 160-4.5 MCG/ACT inhaler Commonly known as: Symbicort Take 2 puffs first thing in am and then another 2 puffs about 12 hours later.   cetirizine 10 MG tablet Commonly known as: ZYRTEC TAKE 1 TABLET (10 MG TOTAL) BY MOUTH DAILY.   diltiazem 240 MG 24 hr capsule Commonly known as: Dilt-XR Take 1 capsule (240 mg total) by mouth daily.   Fluticasone-Salmeterol 250-50 MCG/DOSE Aepb Commonly known as: ADVAIR Inhale 1 puff into the lungs 2 (two) times daily.   olmesartan 40 MG tablet Commonly known as: BENICAR Take 1 tablet (40 mg total) by mouth daily.   predniSONE 20 MG tablet Commonly known as: DELTASONE Take 20 mg by mouth daily with breakfast.   sulfamethoxazole-trimethoprim 800-160 MG tablet Commonly known as: BACTRIM DS Take 1 tablet by mouth every 12 (twelve) hours.   tadalafil 20 MG tablet Commonly known as: CIALIS Take 1 tablet (20 mg total) by mouth daily as needed for erectile dysfunction.   triamcinolone 0.1 % Commonly known as: KENALOG APPLY 1 APPLICATION TOPICALLY 2 (TWO) TIMES DAILY.       Allergies:  Allergies  Allergen Reactions  . Lisinopril Cough  . Lisinopril-Hydrochlorothiazide     dizziness  Family History: No family history on file.  Social History:  reports that he quit smoking about 8 weeks ago. His smoking use included cigarettes. He has a 33.00 pack-year smoking history. He has never used smokeless tobacco. He reports current alcohol use. He reports that he does not use drugs.  ROS: All other review of systems were reviewed and are negative except what is noted above in HPI  Physical Exam: BP (!) 157/72   Pulse 72   Temp 97.9 F (36.6 C)   Ht 6\' 1"  (1.854 m)   Wt 200 lb (90.7 kg)    BMI 26.39 kg/m   Constitutional:  Alert and oriented, No acute distress. HEENT: [place] AT, moist mucus membranes.  Trachea midline, no masses. Cardiovascular: No clubbing, cyanosis, or edema. Respiratory: Normal respiratory effort, no increased work of breathing. GI: Abdomen is soft, nontender, nondistended, no abdominal masses GU: No CVA tenderness.  Lymph: No cervical or inguinal lymphadenopathy. Skin: No rashes, bruises or suspicious lesions. Neurologic: Grossly intact, no focal deficits, moving all 4 extremities. Psychiatric: Normal mood and affect.  Laboratory Data: Lab Results  Component Value Date   WBC 6.3 07/08/2018   HGB 14.2 07/08/2018   HCT 41.8 07/08/2018   MCV 85.3 07/08/2018   PLT 268.0 07/08/2018    Lab Results  Component Value Date   CREATININE 1.61 (H) 10/02/2019    Lab Results  Component Value Date   PSA 5.4 (H) 09/14/2016   PSA 5.58 (H) 01/24/2016    No results found for: TESTOSTERONE  Lab Results  Component Value Date   HGBA1C 5.6 10/02/2019    Urinalysis    Component Value Date/Time   APPEARANCEUR Clear 09/07/2020 1532   GLUCOSEU Negative 09/07/2020 1532   BILIRUBINUR Negative 09/07/2020 1532   KETONESUR negative 10/17/2017 1615   PROTEINUR Negative 09/07/2020 1532   UROBILINOGEN 0.2 10/17/2017 1615   NITRITE Negative 09/07/2020 1532   LEUKOCYTESUR Negative 09/07/2020 1532    Lab Results  Component Value Date   LABMICR See below: 07/27/2020   WBCUA None seen 07/27/2020   LABEPIT None seen 07/27/2020   BACTERIA None seen 07/27/2020    Pertinent Imaging:  No results found for this or any previous visit.  No results found for this or any previous visit.  No results found for this or any previous visit.  No results found for this or any previous visit.  Results for orders placed during the hospital encounter of 05/27/20  US RENAL  Narrative CLINICAL DATA:  CKD stage 3  EXAM: RENAL / URINARY TRACT ULTRASOUND  COMPLETE  COMPARISON:  Chest CT acquired on September 25, 2019, limited assessment of the kidneys at that time.  FINDINGS: Andrew [name] Kidney:  Renal measurements: 10.7 x 4.0 x 4.5 cm = volume: 102 mL. Small hypoechoic to anechoic focus in the upper pole of the Andrew [name] kidney shows some central increased echogenicity and perhaps multi septate changes.  3.9 x 3.3 x 2.7 cm anechoic cyst with adjacent smaller cystic area 1.2 x 1.3 x 1.3 cm cannot be classified as a simple cyst and does not show overt signs of flow. No hydronephrosis. Decreased corticomedullary differentiation.  Left Kidney:  Renal measurements: 10.6 x 5.7 x 4.8 cm = volume: 151 mL. Decreased corticomedullary differentiation without hydronephrosis. Small mildly complex areas with multi septate or spongiform appearance in the upper pole and interpolar portion largest measuring 1.6 x 1.4 x 1.5 cm with smaller areas, less than 10 also showing some cystic characteristics.  Bladder:  Appears normal for degree of bladder distention.  Other:  None.  IMPRESSION: 1. Decreased corticomedullary differentiation without hydronephrosis. Findings can be seen in the setting of medical renal disease. 2. Multiple cystic lesions in the bilateral kidneys without renal enlargement. Some of these areas display internal complexity/variable echogenicity. MRI may be helpful for further evaluation to exclude the presence of enhancing components/small cystic renal neoplasm.  These results will be called to the ordering clinician or representative by the Radiologist Assistant, and communication documented in the PACS or [employer].   Electronically Signed By: Zetta Bills M.D. On: 05/30/2020 10:55  No results found for this or any previous visit.  No results found for this or any previous visit.  No results found for this or any previous visit.   Assessment & Plan:    1. Nocturia -uroxatral 10mg  - Urinalysis, Routine  w reflex microscopic  2. Benign prostatic hyperplasia with urinary obstruction -continue uroxatral 10mg  qhs  3. Prostate cancer ([place]) -PSA today, will call with results. If the PSA remains high we will proceed with prostate biopsy  4. Erectile dysfunction -tadalafil 20mg  prn   No follow-ups on file.  Nicolette Bang, MD  [facility] [place]

## 2021-01-09 NOTE — Progress Notes (Signed)
Urological Symptom Review  Patient is experiencing the following symptoms: Hard to postpone urination Weak stream   Review of Systems  Gastrointestinal (upper)  : Negative for upper GI symptoms  Gastrointestinal (lower) : Negative for lower GI symptoms  Constitutional : Negative for symptoms  Skin: Negative for skin symptoms  Eyes: Negative for eye symptoms  Ear/Nose/Throat : Negative for Ear/Nose/Throat symptoms  Hematologic/Lymphatic: Negative for Hematologic/Lymphatic symptoms  Cardiovascular : Negative for cardiovascular symptoms  Respiratory : Negative for respiratory symptoms  Endocrine: Negative for endocrine symptoms  Musculoskeletal: Negative for musculoskeletal symptoms  Neurological: Negative for neurological symptoms  Psychologic: Negative for psychiatric symptoms

## 2021-01-09 NOTE — Patient Instructions (Signed)
Prostate Cancer  The prostate is a male gland that helps make semen. It is located below a man's bladder, in front of the rectum. Prostate cancer is when abnormal cells grow in this gland. What are the causes? The cause of this condition is not known. What increases the risk? You are more likely to develop this condition if:  You are 70 years of age or older.  You are African American.  You have a family history of prostate cancer.  You have a family history of breast cancer. What are the signs or symptoms? Symptoms of this condition include:  A need to pee often.  Peeing that is weak, or pee that stops and starts.  Trouble starting or stopping your pee.  Inability to pee.  Blood in your pee or semen.  Pain in the lower back, lower belly (abdomen), hips, or upper thighs.  Trouble getting an erection.  Trouble emptying all of your pee. How is this treated? Treatment for this condition depends on your age, your health, the kind of treatment you like, and how far the cancer has spread. Treatments include:  Being watched. This is called observation. You will be tested from time to time, but you will not get treated. Tests are to make sure that the cancer is not growing.  Surgery. This may be done to remove the prostate, to remove the testicles, or to freeze or kill cancer cells.  Radiation. This uses a strong beam to kill cancer cells.  Ultrasound energy. This uses strong sound waves to kill cancer cells.  Chemotherapy. This uses medicines that stop cancer cells from increasing. This kills cancer cells and healthy cells.  Targeted therapy. This kills cancer cells only. Healthy cells are not affected.  Hormone treatment. This stops the body from making hormones that help the cancer cells to grow. Follow these instructions at home:  Take over-the-counter and prescription medicines only as told by your doctor.  Eat a healthy diet.  Get plenty of sleep.  Ask your  doctor for help to find a support group for men with prostate cancer.  If you have to go to the hospital, let your cancer doctor (oncologist) know.  Treatment may affect your ability to have sex. Touch, hold, hug, and caress your partner to have intimate moments.  Keep all follow-up visits as told by your doctor. This is important. Contact a doctor if:  You have new or more trouble peeing.  You have new or more blood in your pee.  You have new or more pain in your hips, back, or chest. Get help right away if:  You have weakness in your legs.  You lose feeling in your legs.  You cannot control your pee or your poop (stool).  You have chills or a fever. Summary  The prostate is a male gland that helps make semen.  Prostate cancer is when abnormal cells grow in this gland.  Treatment includes doing surgery, using medicines, using very strong beams, or watching without treatment.  Ask your doctor for help to find a support group for men with prostate cancer.  Contact a doctor if you have problems peeing or have any new pain that you did not have before. This information is not intended to replace advice given to you by your health care provider. Make sure you discuss any questions you have with your health care provider. Document Revised: 10/13/2019 Document Reviewed: 10/13/2019 Elsevier Patient Education  2021 Elsevier Inc.  

## 2021-01-10 ENCOUNTER — Telehealth: Payer: Self-pay

## 2021-01-10 DIAGNOSIS — C61 Malignant neoplasm of prostate: Secondary | ICD-10-CM

## 2021-01-10 LAB — PSA: Prostate Specific Ag, Serum: 20.5 ng/mL — ABNORMAL HIGH (ref 0.0–4.0)

## 2021-01-10 NOTE — Telephone Encounter (Signed)
Patient returned call and made aware.

## 2021-01-10 NOTE — Telephone Encounter (Signed)
-----   Message from Cleon Gustin, MD sent at 01/10/2021 10:46 AM EST ----- PSA continues to increase. He needs a prostate MRI. I have placed the imaging order. ----- Message ----- From: Iris Pert, LPN Sent: 06/19/7578   8:59 AM EST To: Cleon Gustin, MD  Please review

## 2021-01-11 ENCOUNTER — Encounter: Payer: Self-pay | Admitting: Urology

## 2021-02-01 ENCOUNTER — Other Ambulatory Visit: Payer: Medicare HMO | Admitting: Urology

## 2021-02-03 ENCOUNTER — Other Ambulatory Visit: Payer: Medicare HMO | Admitting: Urology

## 2021-02-07 ENCOUNTER — Encounter: Payer: Medicare HMO | Admitting: Internal Medicine

## 2021-02-10 ENCOUNTER — Ambulatory Visit: Payer: Medicare HMO | Admitting: Urology

## 2021-02-17 ENCOUNTER — Other Ambulatory Visit: Payer: Self-pay

## 2021-02-17 ENCOUNTER — Ambulatory Visit
Admission: RE | Admit: 2021-02-17 | Discharge: 2021-02-17 | Disposition: A | Discharge status: Home / Self Care | Payer: Medicare HMO | Source: Ambulatory Visit | Attending: Urology | Admitting: Urology

## 2021-02-17 DIAGNOSIS — C61 Malignant neoplasm of prostate: Secondary | ICD-10-CM

## 2021-02-17 MED ORDER — GADOBENATE DIMEGLUMINE 529 MG/ML IV SOLN
19.0000 mL | Freq: Once | INTRAVENOUS | Status: AC | PRN
  Administered 2021-02-17: 19 mL via INTRAVENOUS

## 2021-02-27 ENCOUNTER — Other Ambulatory Visit (HOSPITAL_COMMUNITY)
Admission: RE | Admit: 2021-02-27 | Discharge: 2021-02-27 | Disposition: A | Discharge status: Home / Self Care | Payer: Medicare HMO | Source: Ambulatory Visit | Attending: Primary Care | Admitting: Primary Care

## 2021-02-27 DIAGNOSIS — Z20822 Contact with and (suspected) exposure to covid-19: Secondary | ICD-10-CM | POA: Diagnosis not present

## 2021-02-27 DIAGNOSIS — Z01812 Encounter for preprocedural laboratory examination: Secondary | ICD-10-CM | POA: Diagnosis not present

## 2021-02-28 LAB — SARS CORONAVIRUS 2 (TAT 6-24 HRS): SARS Coronavirus 2: NEGATIVE

## 2021-03-01 ENCOUNTER — Other Ambulatory Visit: Payer: Self-pay | Admitting: Internal Medicine

## 2021-03-01 DIAGNOSIS — J449 Chronic obstructive pulmonary disease, unspecified: Secondary | ICD-10-CM

## 2021-03-03 ENCOUNTER — Ambulatory Visit: Payer: Medicare HMO | Admitting: Primary Care

## 2021-03-03 ENCOUNTER — Ambulatory Visit (INDEPENDENT_AMBULATORY_CARE_PROVIDER_SITE_OTHER): Payer: Medicare HMO | Admitting: Internal Medicine

## 2021-03-03 ENCOUNTER — Other Ambulatory Visit: Payer: Self-pay

## 2021-03-03 ENCOUNTER — Encounter: Payer: Self-pay | Admitting: Primary Care

## 2021-03-03 ENCOUNTER — Ambulatory Visit: Payer: Medicare HMO | Admitting: Internal Medicine

## 2021-03-03 DIAGNOSIS — J3081 Allergic rhinitis due to animal (cat) (dog) hair and dander: Secondary | ICD-10-CM

## 2021-03-03 DIAGNOSIS — J449 Chronic obstructive pulmonary disease, unspecified: Secondary | ICD-10-CM | POA: Diagnosis not present

## 2021-03-03 LAB — PULMONARY FUNCTION TEST
DL/VA % pred: 82 %
DL/VA: 3.3 ml/min/mmHg/L
DLCO cor % pred: 75 %
DLCO cor: 21.2 ml/min/mmHg
DLCO unc % pred: 75 %
DLCO unc: 21.2 ml/min/mmHg
FEF 25-75 Post: 0.98 L/sec
FEF 25-75 Pre: 1.02 L/sec
FEF2575-%Change-Post: -3 %
FEF2575-%Pred-Post: 35 %
FEF2575-%Pred-Pre: 37 %
FEV1-%Change-Post: 0 %
FEV1-%Pred-Post: 62 %
FEV1-%Pred-Pre: 62 %
FEV1-Post: 1.99 L
FEV1-Pre: 2 L
FEV1FVC-%Change-Post: 0 %
FEV1FVC-%Pred-Pre: 76 %
FEV6-%Change-Post: -1 %
FEV6-%Pred-Post: 83 %
FEV6-%Pred-Pre: 85 %
FEV6-Post: 3.38 L
FEV6-Pre: 3.44 L
FEV6FVC-%Pred-Post: 104 %
FEV6FVC-%Pred-Pre: 104 %
FVC-%Change-Post: -1 %
FVC-%Pred-Post: 80 %
FVC-%Pred-Pre: 81 %
FVC-Post: 3.39 L
FVC-Pre: 3.44 L
Post FEV1/FVC ratio: 59 %
Post FEV6/FVC ratio: 100 %
Pre FEV1/FVC ratio: 58 %
Pre FEV6/FVC Ratio: 100 %
RV % pred: 178 %
RV: 4.63 L
TLC % pred: 110 %
TLC: 8.33 L

## 2021-03-03 MED ORDER — BREZTRI AEROSPHERE 160-9-4.8 MCG/ACT IN AERO: 2.0000 | INHALATION_SPRAY | Freq: Two times a day (BID) | RESPIRATORY_TRACT | 0 refills | Status: DC

## 2021-03-03 MED ORDER — GUAIFENESIN ER 600 MG PO TB12: 600.0000 mg | ORAL_TABLET | Freq: Two times a day (BID) | ORAL | 2 refills | Status: DC | PRN

## 2021-03-03 MED ORDER — FLUTICASONE PROPIONATE 50 MCG/ACT NA SUSP: 1.0000 | Freq: Every day | NASAL | 2 refills | Status: DC

## 2021-03-03 NOTE — Progress Notes (Signed)
@Patient  ID: Andrew [name], male    DOB: 1951/05/22, 70 y.o.   MRN: [id number]  Chief Complaint  Patient presents with  . Follow-up    Review PFT results    Referring provider: Horald Pollen, *  HPI: 70 year old male, former smoker quit in January 2022 (33-pack-year history).  Significant for COPD Gold 2, hypertension, chronic kidney disease stage IIIa, prostate cancer, acquired thrombofailure, hyperlipidemia, prediabetes, vitamin D deficiency.  Patient of Dr. Melvyn Novas, last seen in office on 12/01/2020.  03/03/2021-interim history Patient presents today 64-month follow-up with PFTs. He is doing well, no acute complaints. He has noticed his breathing has become some worse over the last year or so. He noticed significant improvement with Breztri sample and would like prescription sent to [facility]. He has some chest congestion with occasional mucus production but reports that it is not much.  He is still smoking but has cut back.   Pulmonary function testing 03/03/2021  FVC 3.39 (80%), FEV1 1.99 (62%), ratio 59, DLCOunc 75% p Breztri 6 hours prior  Allergies  Allergen Reactions  . Lisinopril Cough  . Lisinopril-Hydrochlorothiazide     dizziness    Immunization History  Administered Date(s) Administered  . Fluad Quad(high Dose 65+) 09/06/2020  . Influenza,inj,Quad PF,6+ Mos 01/24/2016, 09/02/2017  . PFIZER(Purple Top)SARS-COV-2 Vaccination 12/18/2019, 01/08/2020  . Pneumococcal Conjugate-13 09/02/2017  . Pneumococcal Polysaccharide-23 03/29/2020  . Tdap 03/29/2020    Past Medical History:  Diagnosis Date  . Asthma   . Carotid artery stenosis, asymptomatic, bilateral 12/06/2017   Consider recheck Dopplers in 6 months -> 05/2018 Mild to moderate bilateral carotid artery stenosis Carotid Dopplers done at [facility] cardiovascular 11/26/2017 show left internal carotid artery stenosis 50-69% with soft plaque.  Mild stenosis in the left common carotid artery <50%. Mild stenosis in the  [name] common carotid artery <50% with moderate diffuse soft plaque.  Mild stenosis in the [name] e  . COCAINE ABUSE 01/30/2008   Qualifier: Diagnosis of  By: Amil Amen MD, Benjamine Mola    . COPD (chronic obstructive pulmonary disease) ([place])   . Hypertension     Tobacco History: Social History   Tobacco Use  Smoking Status Former Smoker  . Packs/day: 0.75  . Years: 44.00  . Pack years: 33.00  . Types: Cigarettes  . Quit date: 11/14/2020  . Years since quitting: 0.2  Smokeless Tobacco Never Used   Counseling given: Not Answered   Outpatient Medications Prior to Visit  Medication Sig Dispense Refill  . albuterol (PROVENTIL HFA;VENTOLIN HFA) 108 (90 Base) MCG/ACT inhaler Inhale 2 puffs into the lungs every 4 (four) hours as needed for wheezing or shortness of breath (cough, shortness of breath or wheezing.). 1 Inhaler 11  . albuterol (PROVENTIL) (2.5 MG/3ML) 0.083% nebulizer solution Take 3 mLs (2.5 mg total) by nebulization every 6 (six) hours as needed for wheezing or shortness of breath. 150 mL 2  . alfuzosin (UROXATRAL) 10 MG 24 hr tablet Take 1 tablet (10 mg total) by mouth daily with breakfast. 90 tablet 3  . cetirizine (ZYRTEC) 10 MG tablet TAKE 1 TABLET (10 MG TOTAL) BY MOUTH DAILY. 90 tablet 4  . olmesartan (BENICAR) 40 MG tablet Take 1 tablet (40 mg total) by mouth daily. 90 tablet 3  . tadalafil (CIALIS) 20 MG tablet Take 1 tablet (20 mg total) by mouth daily as needed for erectile dysfunction. 10 tablet 5  . triamcinolone (KENALOG) 0.1 % APPLY 1 APPLICATION TOPICALLY 2 (TWO) TIMES DAILY. 80 g 0  . budesonide-formoterol (  SYMBICORT) 160-4.5 MCG/ACT inhaler INHALE 2 PUFFS FIRST THING IN THE MORNING AND THEN 2 PUFFS ABOUT 12 HOURS LATER AS DIRECTED 30.6 g 3  . predniSONE (DELTASONE) 20 MG tablet Take 20 mg by mouth daily with breakfast.    . sulfamethoxazole-trimethoprim (BACTRIM DS) 800-160 MG tablet Take 1 tablet by mouth every 12 (twelve) hours. 56 tablet 0  . diltiazem (DILT-XR)  240 MG 24 hr capsule Take 1 capsule (240 mg total) by mouth daily. 90 capsule 3  . Budeson-Glycopyrrol-Formoterol (BREZTRI AEROSPHERE) 160-9-4.8 MCG/ACT AERO Inhale 2 puffs into the lungs in the morning and at bedtime. (Patient not taking: Reported on 03/03/2021) 10.7 g 0  . Fluticasone-Salmeterol (ADVAIR) 250-50 MCG/DOSE AEPB Inhale 1 puff into the lungs 2 (two) times daily.  (Patient not taking: Reported on 03/03/2021)     No facility-administered medications prior to visit.   Review of Systems  Review of Systems  Constitutional: Negative.   HENT: Positive for congestion and sneezing.   Respiratory: Positive for cough. Negative for chest tightness, shortness of breath and wheezing.   Cardiovascular: Negative.    Physical Exam  BP 118/64 (BP Location: [name] Arm, Cuff Size: Normal)   Pulse 75   Temp 97.9 F (36.6 C) (Temporal)   Ht 6' 0.5" (1.842 m)   Wt 200 lb (90.7 kg)   SpO2 95% Comment: RA  BMI 26.75 kg/m  Physical Exam Constitutional:      Appearance: Normal appearance.  HENT:     Mouth/Throat:     Mouth: Mucous membranes are moist.     Pharynx: Oropharynx is clear.  Cardiovascular:     Rate and Rhythm: Normal rate and regular rhythm.  Pulmonary:     Effort: Pulmonary effort is normal.     Breath sounds: Normal breath sounds.  Musculoskeletal:        General: Normal range of motion.  Skin:    General: Skin is warm and dry.  Neurological:     General: No focal deficit present.     Mental Status: He is alert and oriented to person, place, and time. Mental status is at baseline.  Psychiatric:        Mood and Affect: Mood normal.        Behavior: Behavior normal.        Thought Content: Thought content normal.        Judgment: Judgment normal.      Lab Results:  CBC    Component Value Date/Time   WBC 6.3 07/08/2018 1101   RBC 4.90 07/08/2018 1101   HGB 14.2 07/08/2018 1101   HCT 41.8 07/08/2018 1101   PLT 268.0 07/08/2018 1101   MCV 85.3 07/08/2018 1101    MCV 84.4 06/29/2017 1002   MCH 28.4 07/21/2017 0123   MCHC 34.0 07/08/2018 1101   RDW 13.5 07/08/2018 1101   LYMPHSABS 1.6 07/08/2018 1101   MONOABS 0.5 07/08/2018 1101   EOSABS 0.3 07/08/2018 1101   BASOSABS 0.1 07/08/2018 1101    BMET    Component Value Date/Time   NA 140 10/02/2019 1115   K 4.4 10/02/2019 1115   CL 102 10/02/2019 1115   CO2 20 10/02/2019 1115   GLUCOSE 89 10/02/2019 1115   GLUCOSE 105 (H) 07/21/2017 0123   BUN 13 10/02/2019 1115   CREATININE 1.61 (H) 10/02/2019 1115   CREATININE 1.33 (H) 09/14/2016 1306   CALCIUM 9.8 10/02/2019 1115   GFRNONAA 43 (L) 10/02/2019 1115   GFRNONAA 56 (L) 09/14/2016 1306   GFRAA  50 (L) 10/02/2019 1115   GFRAA 64 09/14/2016 1306    BNP No results found for: BNP  ProBNP No results found for: PROBNP  Imaging: MR PROSTATE W WO CONTRAST  Result Date: 02/20/2021 CLINICAL DATA:  Prostate cancer, surveillance, PSA 20.5 EXAM: MR PROSTATE WITHOUT AND WITH CONTRAST TECHNIQUE: Multiplanar multisequence MRI images were obtained of the pelvis centered about the prostate. Pre and post contrast images were obtained. CONTRAST:  11mL MULTIHANCE GADOBENATE DIMEGLUMINE 529 MG/ML IV SOLN COMPARISON:  None. FINDINGS: Prostate: 2.4 x 1.1 x 1.9 cm geographic T2 lesion in the left posterolateral peripheral zone, apex to base (series 9/image 13). Precontrast T1 hyperintensity is present in this region (series 11/image 10), indicating some degree of hemorrhage/protein. However, superimposed early arterial enhancement is suspected, at least within the mid gland posteriorly (series 12/image 191). Associated foci of moderate restricted diffusion/low ADC (series 6-7/image 12). This appearance is considered suspicious for macroscopic prostate cancer (PI-RADS 5); correlate with the patient's known biopsy-proven tumor. This has been marked as ROI2 in [employer]. 1.6 x 0.9 x 0.8 cm smudgy T2 lesion in the left anterior apex of the central gland, equivocal.  Moderate restricted diffusion/low ADC (series 6-7/image 12). Possible early arterial enhancement (series 12/image 154), although equivocal. This could reflect a focus of central gland tumor (PI-RADS 4); correlate with the patient's known biopsy-proven tumor. This has been marked as ROI1 in [employer]. Volume: 5.0 x 3.6 x 4.0 cm (calculated volume 36.8 mL) Transcapsular spread:  Absent Seminal vesicle involvement: Absent Neurovascular bundle involvement: Absent Pelvic adenopathy: Absent Bone metastasis: Absent Other findings: Tiny fat containing [name] inguinal hernia. IMPRESSION: 2.4 cm geographic lesion in the left posterolateral peripheral zone, suspicious for macroscopic prostate cancer, although some of this appearance may reflect post biopsy hemorrhage/protein. PI-RADS 5. Correlate with the patient's known biopsy-proven tumor. Additional 1.6 cm lesion in the left anterior apex of the central gland, possibly reflecting central gland tumor, although equivocal. PI-RADS 4. No evidence of extracapsular extension, seminal vesicle invasion, lymphadenopathy, or metastatic disease. Calculated prostate volume 36.8 mL. Electronically Signed   By: Julian Hy M.D.   On: 02/20/2021 08:15     Assessment & Plan:   COPD GOLD 2 still smoking  - 03/03/2021 FEV1 1.99 (62%), ratio 59 p Breztri prior  - Stable; Breathing improved with triple therapy. No recent exacerbations; ED visit in January for AECOPD. Still smoking but has cut back. - Continue Breztri two puffs q 12 hours  - Use mucinex 600mg  twice daily for cough/loosen phlegm  - FU in 6 months with Dr. Melvyn Novas  Allergic rhinitis - Allergy profile 07/08/18>> Eos 300, IgE 212 - Use flonase nasal spray at bedtime for nasal congestion    Martyn Ehrich, NP 03/03/2021

## 2021-03-03 NOTE — Assessment & Plan Note (Addendum)
-   03/03/2021 FEV1 1.99 (62%), ratio 59 p Breztri prior  - Stable; Breathing improved with triple therapy. No recent exacerbations; ED visit in January 2022 for AECOPD. Still smoking but has cut back. - Continue Breztri two puffs q 12 hours  - Use mucinex 600mg  twice daily for cough/loosen phlegm  - FU in 6 months with Dr. Melvyn Novas

## 2021-03-03 NOTE — Progress Notes (Signed)
Full PFT performed today. °

## 2021-03-03 NOTE — Patient Instructions (Addendum)
Finish symbicort and than start Breztri two puffs morning and evening (rinse mouth after use) Take muciex 600mg  twice a day as needed for chest congestion Use flonase nasal spray at bedtime for nasal congestion  Stay active, consider pulmonary rehab in the future if needed   Follow-up: 6 months with Dr. Melvyn Novas or sooner if needed    COPD and Physical Activity Chronic obstructive pulmonary disease (COPD) is a long-term (chronic) condition that affects the lungs. COPD is a general term that can be used to describe many different lung problems that cause lung swelling (inflammation) and limit airflow, including chronic bronchitis and emphysema. The main symptom of COPD is shortness of breath, which makes it harder to do even simple tasks. This can also make it harder to exercise and be active. Talk with your health care provider about treatments to help you breathe better and actions you can take to prevent breathing problems during physical activity. What are the benefits of exercising with COPD? Exercising regularly is an important part of a healthy lifestyle. You can still exercise and do physical activities even though you have COPD. Exercise and physical activity improve your shortness of breath by increasing blood flow (circulation). This causes your heart to pump more oxygen through your body. Moderate exercise can improve your:  Oxygen use.  Energy level.  Shortness of breath.  Strength in your breathing muscles.  Heart health.  Sleep.  Self-esteem and feelings of self-worth.  Depression, stress, and anxiety levels. Exercise can benefit everyone with COPD. The severity of your disease may affect how hard you can exercise, especially at first, but everyone can benefit. Talk with your health care provider about how much exercise is safe for you, and which activities and exercises are safe for you.   What actions can I take to prevent breathing problems during physical  activity?  Sign up for a pulmonary rehabilitation program. This type of program may include: ? Education about lung diseases. ? Exercise classes that teach you how to exercise and be more active while improving your breathing. This usually involves:  Exercise using your lower extremities, such as a stationary bicycle.  About 30 minutes of exercise, 2 to 5 times per week, for 6 to 12 weeks  Strength training, such as push ups or leg lifts. ? Nutrition education. ? Group classes in which you can talk with others who also have COPD and learn ways to manage stress.  If you use an oxygen tank, you should use it while you exercise. Work with your health care provider to adjust your oxygen for your physical activity. Your resting flow rate is different from your flow rate during physical activity.  While you are exercising: ? Take slow breaths. ? Pace yourself and do not try to go too fast. ? Purse your lips while breathing out. Pursing your lips is similar to a kissing or whistling position. ? If doing exercise that uses a quick burst of effort, such as weight lifting:  Breathe in before starting the exercise.  Breathe out during the hardest part of the exercise (such as raising the weights). Where to find support You can find support for exercising with COPD from:  Your health care provider.  A pulmonary rehabilitation program.  Your local health department or community health programs.  Support groups, online or in-person. Your health care provider may be able to recommend support groups. Where to find more information You can find more information about exercising with COPD from:  American  Lung Association: ClassInsider.se.  COPD Foundation: https://www.rivera.net/. Contact a health care provider if:  Your symptoms get worse.  You have chest pain.  You have nausea.  You have a fever.  You have trouble talking or catching your breath.  You want to start a new exercise program or  a new activity. Summary  COPD is a general term that can be used to describe many different lung problems that cause lung swelling (inflammation) and limit airflow. This includes chronic bronchitis and emphysema.  Exercise and physical activity improve your shortness of breath by increasing blood flow (circulation). This causes your heart to provide more oxygen to your body.  Contact your health care provider before starting any exercise program or new activity. Ask your health care provider what exercises and activities are safe for you. This information is not intended to replace advice given to you by your health care provider. Make sure you discuss any questions you have with your health care provider. Document Revised: 02/18/2019 Document Reviewed: 11/21/2017 Elsevier Patient Education  2021 [employer].

## 2021-03-03 NOTE — Assessment & Plan Note (Signed)
-   Allergy profile 07/08/18>> Eos 300, IgE 212 - Use flonase nasal spray at bedtime for nasal congestion

## 2021-03-03 NOTE — Patient Instructions (Signed)
Full PFT performed today. °

## 2021-03-14 ENCOUNTER — Ambulatory Visit (AMBULATORY_SURGERY_CENTER): Payer: Medicare HMO | Admitting: *Deleted

## 2021-03-14 ENCOUNTER — Other Ambulatory Visit: Payer: Self-pay

## 2021-03-14 ENCOUNTER — Encounter: Payer: Medicare HMO | Admitting: Internal Medicine

## 2021-03-14 VITALS — Ht 72.5 in | Wt 200.0 lb

## 2021-03-14 DIAGNOSIS — Z1211 Encounter for screening for malignant neoplasm of colon: Secondary | ICD-10-CM

## 2021-03-14 MED ORDER — NA SULFATE-K SULFATE-MG SULF 17.5-3.13-1.6 GM/177ML PO SOLN: ORAL | 0 refills | Status: DC

## 2021-03-14 NOTE — Progress Notes (Signed)
Patient's pre-visit was done today over the phone with the patient due to COVID-19 pandemic. Name,DOB and address verified. Insurance verified. Patient denies any allergies to Eggs and Soy. Patient denies any problems with anesthesia/sedation. Patient denies taking diet pills or blood thinners. Packet of Prep instructions mailed to patient including a copy of a consent form-pt is aware. Patient understands to call us back with any questions or concerns. The patient is COVID-19 fully vaccinated, per patient. Patient is aware of our care-partner policy and Covid-19 safety protocol. EMMI education assigned to the patient for the procedure, sent to MyChart.  

## 2021-03-28 ENCOUNTER — Encounter: Payer: Self-pay | Admitting: Gastroenterology

## 2021-03-28 ENCOUNTER — Ambulatory Visit (AMBULATORY_SURGERY_CENTER): Payer: Medicare HMO | Admitting: Gastroenterology

## 2021-03-28 ENCOUNTER — Other Ambulatory Visit: Payer: Self-pay

## 2021-03-28 VITALS — BP 104/62 | HR 64 | Temp 97.8°F | Resp 14 | Ht 77.0 in | Wt 200.0 lb

## 2021-03-28 DIAGNOSIS — Z1211 Encounter for screening for malignant neoplasm of colon: Secondary | ICD-10-CM | POA: Diagnosis not present

## 2021-03-28 DIAGNOSIS — D123 Benign neoplasm of transverse colon: Secondary | ICD-10-CM | POA: Diagnosis not present

## 2021-03-28 HISTORY — PX: COLONOSCOPY WITH PROPOFOL: SHX5780

## 2021-03-28 MED ORDER — SODIUM CHLORIDE 0.9 % IV SOLN: 500.0000 mL | INTRAVENOUS | Status: DC

## 2021-03-28 NOTE — Op Note (Signed)
[facility] Patient Name: Andrew [name] Procedure Date: 03/28/2021 7:36 AM MRN: [id number] Endoscopist: Milus Banister , MD Age: 70 Referring MD:  Date of Birth: Jun 24, 1951 Gender: Male Account #: [id number] Procedure:                Colonoscopy Indications:              Screening for colorectal malignant neoplasm Medicines:                Monitored Anesthesia Care Procedure:                Pre-Anesthesia Assessment:                           - Prior to the procedure, a History and Physical                            was performed, and patient medications and                            allergies were reviewed. The patient's tolerance of                            previous anesthesia was also reviewed. The risks                            and benefits of the procedure and the sedation                            options and risks were discussed with the patient.                            All questions were answered, and informed consent                            was obtained. Prior Anticoagulants: The patient has                            taken no previous anticoagulant or antiplatelet                            agents. ASA Grade Assessment: II - A patient with                            mild systemic disease. After reviewing the risks                            and benefits, the patient was deemed in                            satisfactory condition to undergo the procedure.                           After obtaining informed consent, the colonoscope  was passed under direct vision. Throughout the                            procedure, the patient's blood pressure, pulse, and                            oxygen saturations were monitored continuously. The                            Olympus CF-HQ190L (Serial# [id number]) Colonoscope was                            introduced through the anus and advanced to the the                            cecum,  identified by appendiceal orifice and                            ileocecal valve. The colonoscopy was performed                            without difficulty. The patient tolerated the                            procedure well. The quality of the bowel                            preparation was good. The ileocecal valve,                            appendiceal orifice, and rectum were photographed. Scope In: 8:00:56 AM Scope Out: 8:17:31 AM Scope Withdrawal Time: 0 hours 11 minutes 41 seconds  Total Procedure Duration: 0 hours 16 minutes 35 seconds  Findings:                 A 3 mm polyp was found in the transverse colon. The                            polyp was sessile. The polyp was removed with a                            cold snare. Resection and retrieval were complete.                           A few small-mouthed diverticula were found in the                            entire colon.                           Soft, 1cm, pedunculated nodule (vs. polyp) that is                            intimately associated with small internal  hemorrhoids.                           The exam was otherwise without abnormality on                            direct and retroflexion views. Complications:            No immediate complications. Estimated blood loss:                            None. Estimated Blood Loss:     Estimated blood loss: none. Impression:               - One 3 mm polyp in the transverse colon, removed                            with a cold snare. Resected and retrieved.                           - Diverticulosis in the entire examined colon.                           - Soft, 1cm, pedunculated nodule (vs. polyp) that                            is intimately associated with small internal                            hemorrhoids.                           - The examination was otherwise normal on direct                            and retroflexion  views. Recommendation:           - Patient has a contact number available for                            emergencies. The signs and symptoms of potential                            delayed complications were discussed with the                            patient. Return to normal activities tomorrow.                            Written discharge instructions were provided to the                            patient.                           - Resume previous diet.                           -  Continue present medications.                           - Await pathology results.                           - Referral to [facility] to consider transanal                            resection of small pedunculated nodule. I suspect                            this is a skin tag or a small polyp. Milus Banister, MD 03/28/2021 8:21:47 AM This report has been signed electronically.

## 2021-03-28 NOTE — Progress Notes (Signed)
Called to room to assist during endoscopic procedure.  Patient ID and intended procedure confirmed with present staff. Received instructions for my participation in the procedure from the performing physician.  

## 2021-03-28 NOTE — Progress Notes (Signed)
Pt's states no medical or surgical changes since previsit or office visit. 

## 2021-03-28 NOTE — Progress Notes (Signed)
pt tolerated well. VSS. awake and to recovery. Report given to RN.  

## 2021-03-28 NOTE — Patient Instructions (Signed)
Read all of the handouts given to you by your recovery room nurse. The office will refer you to a surgeon regarding your nodule.  YOU HAD AN ENDOSCOPIC PROCEDURE TODAY AT [facility]:   Refer to the procedure report that was given to you for any specific questions about what was found during the examination.  If the procedure report does not answer your questions, please call your gastroenterologist to clarify.  If you requested that your care partner not be given the details of your procedure findings, then the procedure report has been included in a sealed envelope for you to review at your convenience later.  YOU SHOULD EXPECT: Some feelings of bloating in the abdomen. Passage of more gas than usual.  Walking can help get rid of the air that was put into your GI tract during the procedure and reduce the bloating. If you had a lower endoscopy (such as a colonoscopy or flexible sigmoidoscopy) you may notice spotting of blood in your stool or on the toilet paper. If you underwent a bowel prep for your procedure, you may not have a normal bowel movement for a few days.  Please Note:  You might notice some irritation and congestion in your nose or some drainage.  This is from the oxygen used during your procedure.  There is no need for concern and it should clear up in a day or so.  SYMPTOMS TO REPORT IMMEDIATELY:   Following lower endoscopy (colonoscopy or flexible sigmoidoscopy):  Excessive amounts of blood in the stool  Significant tenderness or worsening of abdominal pains  Swelling of the abdomen that is new, acute  Fever of 100F or higher   For urgent or emergent issues, a gastroenterologist can be reached at any hour by calling [phone]. Do not use MyChart messaging for urgent concerns.    DIET:  We do recommend a small meal at first, but then you may proceed to your regular diet.  Drink plenty of fluids but you should avoid alcoholic beverages for 24 hours.  Try  to eat a high fiber diet, and drink plenty of water.  ACTIVITY:  You should plan to take it easy for the rest of today and you should NOT DRIVE or use heavy machinery until tomorrow (because of the sedation medicines used during the test).    FOLLOW UP: Our staff will call the number listed on your records 48-72 hours following your procedure to check on you and address any questions or concerns that you may have regarding the information given to you following your procedure. If we do not reach you, we will leave a message.  We will attempt to reach you two times.  During this call, we will ask if you have developed any symptoms of COVID 19. If you develop any symptoms (ie: fever, flu-like symptoms, shortness of breath, cough etc.) before then, please call [phone].  If you test positive for Covid 19 in the 2 weeks post procedure, please call and report this information to [place].    If any biopsies were taken you will be contacted by phone or by letter within the next 1-3 weeks.  Please call us at [phone] if you have not heard about the biopsies in 3 weeks.    SIGNATURES/CONFIDENTIALITY: You and/or your care partner have signed paperwork which will be entered into your electronic medical record.  These signatures attest to the fact that that the information above on your After Visit Summary has been  reviewed and is understood.  Full responsibility of the confidentiality of this discharge information lies with you and/or your care-partner. 

## 2021-03-29 ENCOUNTER — Ambulatory Visit: Payer: Self-pay | Admitting: Emergency Medicine

## 2021-03-30 ENCOUNTER — Telehealth: Payer: Self-pay | Admitting: *Deleted

## 2021-03-30 NOTE — Telephone Encounter (Signed)
  Follow up Call-  Call back number 03/28/2021  Post procedure Call Back phone  # [phone]  Permission to leave phone message Yes  Some recent data might be hidden    [facility]

## 2021-03-30 NOTE — Telephone Encounter (Signed)
No answer for post procedure call back. Left message for patient to call back with questions or concerns.

## 2021-04-03 ENCOUNTER — Encounter: Payer: Self-pay | Admitting: Gastroenterology

## 2021-04-06 ENCOUNTER — Ambulatory Visit: Payer: Medicare HMO | Admitting: Emergency Medicine

## 2021-04-12 ENCOUNTER — Telehealth: Payer: Self-pay

## 2021-04-12 DIAGNOSIS — C61 Malignant neoplasm of prostate: Secondary | ICD-10-CM

## 2021-04-12 NOTE — Telephone Encounter (Signed)
Left message to return call to office.  Referral placed.

## 2021-04-12 NOTE — Telephone Encounter (Signed)
-----   Message from Cleon Gustin, MD sent at 04/11/2021 11:54 AM EDT ----- MRI showed 2 lesions that may represent prostate cancer. He needs to be referred to [facility] for an MRI fusion biopsy ----- Message ----- From: Iris Pert, LPN Sent: 5/84/8350   8:57 AM EDT To: Cleon Gustin, MD  Please review

## 2021-04-13 NOTE — Telephone Encounter (Signed)
Patient returned call to office. Reviewed results with patient and voiced understanding he will need Fusion Biopsy done at [facility].  Patient instructed to call back and let our office know if he has not heard from [facility] by end of next week to have scheduled.

## 2021-04-17 NOTE — Telephone Encounter (Signed)
Patient left a Voice Message:  Calling back about a biopsy.  Call back:  [phone]  Thanks, Helene Kelp

## 2021-04-17 NOTE — Telephone Encounter (Signed)
Spoke with patient. Patient stated he spoke with [facility] and he is getting set up for his fusion biopsy.

## 2021-04-20 ENCOUNTER — Ambulatory Visit (INDEPENDENT_AMBULATORY_CARE_PROVIDER_SITE_OTHER): Payer: Medicare HMO | Admitting: Emergency Medicine

## 2021-04-20 ENCOUNTER — Other Ambulatory Visit: Payer: Self-pay

## 2021-04-20 ENCOUNTER — Encounter: Payer: Self-pay | Admitting: Emergency Medicine

## 2021-04-20 VITALS — BP 138/78 | HR 78 | Temp 98.2°F | Ht 77.0 in | Wt 197.0 lb

## 2021-04-20 DIAGNOSIS — Z23 Encounter for immunization: Secondary | ICD-10-CM | POA: Diagnosis not present

## 2021-04-20 DIAGNOSIS — N138 Other obstructive and reflux uropathy: Secondary | ICD-10-CM

## 2021-04-20 DIAGNOSIS — I2510 Atherosclerotic heart disease of native coronary artery without angina pectoris: Secondary | ICD-10-CM | POA: Diagnosis not present

## 2021-04-20 DIAGNOSIS — N401 Enlarged prostate with lower urinary tract symptoms: Secondary | ICD-10-CM | POA: Diagnosis not present

## 2021-04-20 DIAGNOSIS — J449 Chronic obstructive pulmonary disease, unspecified: Secondary | ICD-10-CM | POA: Diagnosis not present

## 2021-04-20 DIAGNOSIS — I10 Essential (primary) hypertension: Secondary | ICD-10-CM | POA: Diagnosis not present

## 2021-04-20 DIAGNOSIS — I700 Atherosclerosis of aorta: Secondary | ICD-10-CM

## 2021-04-20 MED ORDER — ROSUVASTATIN CALCIUM 10 MG PO TABS
10.0000 mg | ORAL_TABLET | Freq: Every day | ORAL | 3 refills | Status: DC
Start: 2021-04-20 — End: 2021-06-28

## 2021-04-20 NOTE — Assessment & Plan Note (Signed)
Responding well to Symbicort.  Using albuterol infrequently. States he is smoking 2 to 3 cigarettes a day, which are less than before.

## 2021-04-20 NOTE — Assessment & Plan Note (Signed)
We will start rosuvastatin 10 mg daily.

## 2021-04-20 NOTE — Progress Notes (Signed)
Andrew [name] 70 y.o.   Chief Complaint  Patient presents with   Hypertension    6 month check. Pt recently had an colonoscopy. Refill of triamcinolone.    HISTORY OF PRESENT ILLNESS: This is a 70 y.o. male here for 3-month recheck of chronic medical problems. 1.  Hypertension on diltiazem 240 mg daily and olmesartan 40 mg daily. 2.  COPD and still smoking.  Uses Symbicort twice a day with good results.  Not having to use albuterol less frequently 3.  BPH with lower urinary tract symptoms.  Alfuzosin 10 mg daily working very well for him. Recent colonoscopy report reviewed with patient.  Shows diverticulosis and 1 polyp in the transverse colon. No other complaints or medical concerns today.  Hypertension Pertinent negatives include no chest pain, headaches, palpitations or shortness of breath.    Prior to Admission medications   Medication Sig Start Date End Date Taking? Authorizing Provider  albuterol (PROVENTIL HFA;VENTOLIN HFA) 108 (90 Base) MCG/ACT inhaler Inhale 2 puffs into the lungs every 4 (four) hours as needed for wheezing or shortness of breath (cough, shortness of breath or wheezing.). 06/29/17  Yes Shawnee Knapp, MD  albuterol (PROVENTIL) (2.5 MG/3ML) 0.083% nebulizer solution Take 3 mLs (2.5 mg total) by nebulization every 6 (six) hours as needed for wheezing or shortness of breath. 03/29/20  Yes Aminat Shelburne, Ines Bloomer, MD  budesonide-formoterol [facility]) 160-4.5 MCG/ACT inhaler Inhale 2 puffs into the lungs 2 (two) times daily.   Yes [provider]  cetirizine (ZYRTEC) 10 MG tablet TAKE 1 TABLET (10 MG TOTAL) BY MOUTH DAILY. 05/05/20  Yes Lyndell Allaire, Ines Bloomer, MD  olmesartan (BENICAR) 40 MG tablet Take 1 tablet (40 mg total) by mouth daily. 12/28/20  Yes Latron Ribas, Ines Bloomer, MD  tadalafil (CIALIS) 20 MG tablet Take 1 tablet (20 mg total) by mouth daily as needed for erectile dysfunction. 01/09/21  Yes McKenzie, Candee Furbish, MD  triamcinolone (KENALOG) 0.1 % APPLY  1 APPLICATION TOPICALLY 2 (TWO) TIMES DAILY. 12/24/20  Yes Jorita Bohanon, Ines Bloomer, MD  alfuzosin (UROXATRAL) 10 MG 24 hr tablet Take 1 tablet (10 mg total) by mouth daily with breakfast. 01/09/21   McKenzie, Candee Furbish, MD  Budeson-Glycopyrrol-Formoterol (BREZTRI AEROSPHERE) 160-9-4.8 MCG/ACT AERO Inhale 2 puffs into the lungs in the morning and at bedtime. Patient not taking: No sig reported 03/03/21   Martyn Ehrich, NP  diltiazem (DILT-XR) 240 MG 24 hr capsule Take 1 capsule (240 mg total) by mouth daily. 05/31/20 08/29/20  Horald Pollen, MD  guaiFENesin (MUCINEX) 600 MG 12 hr tablet Take 1 tablet (600 mg total) by mouth 2 (two) times daily as needed for cough or to loosen phlegm. Patient not taking: No sig reported 03/03/21   Martyn Ehrich, NP    Allergies  Allergen Reactions   Lisinopril Cough   Lisinopril-Hydrochlorothiazide     dizziness    Patient Active Problem List   Diagnosis Date Noted   Nocturia 07/27/2020   Acquired thrombophilia ([place]) 05/05/2020   Prostate cancer ([place]) 01/14/2018   Carotid artery stenosis, asymptomatic, bilateral 12/06/2017   Vitamin D deficiency 12/03/2017   Prediabetes 11/14/2017   Chronic kidney disease (CKD) stage G3a/A2, moderately decreased glomerular filtration rate (GFR) between 45-59 mL/min/1.73 square meter and albuminuria creatinine ratio between 30-299 mg/g ([place]) 11/13/2017   Cigarette smoker 08/20/2017   COPD GOLD 2 still smoking  07/31/2017   Coronary atherosclerosis of native coronary artery 07/31/2017   Benign prostatic hyperplasia with urinary obstruction 05/30/2016  Allergic rhinitis 06/01/2008   DENTAL CARIES 06/01/2008   GERD 03/04/2008   Hyperlipidemia LDL goal <70 01/30/2008   Essential hypertension 01/30/2008   PERIPHERAL VASCULAR DISEASE 01/30/2008    Past Medical History:  Diagnosis Date   Asthma    Carotid artery stenosis, asymptomatic, bilateral 12/06/2017   Consider recheck Dopplers in 6 months -> 05/2018  Mild to moderate bilateral carotid artery stenosis Carotid Dopplers done at [facility] 11/26/2017 show left internal carotid artery stenosis 50-69% with soft plaque.  Mild stenosis in the left common carotid artery <50%. Mild stenosis in the [name] common carotid artery <50% with moderate diffuse soft plaque.  Mild stenosis in the [name] e   COCAINE ABUSE 01/30/2008   Qualifier: Diagnosis of  By: Amil Amen MD, Benjamine Mola     COPD (chronic obstructive pulmonary disease) ([place])    Hypertension     Past Surgical History:  Procedure Laterality Date   NO PAST SURGERIES      Social History   Socioeconomic History   Marital status: Married    Spouse name: Not on file   Number of children: 1   Years of education: Not on file   Highest education level: Not on file  Occupational History   Not on file  Tobacco Use   Smoking status: Some Days    Packs/day: 0.25    Years: 44.00    Pack years: 11.00    Types: Cigarettes   Smokeless tobacco: Never  Vaping Use   Vaping Use: Never used  Substance and Sexual Activity   Alcohol use: Yes    Alcohol/week: 2.0 standard drinks    Types: 1 Cans of beer, 1 Shots of liquor per week    Comment: occasional   Drug use: No   Sexual activity: Not on file  Other Topics Concern   Not on file  Social History Narrative   Not on file   Social Determinants of Health   Financial Resource Strain: Not on file  Food Insecurity: Not on file  Transportation Needs: Not on file  Physical Activity: Not on file  Stress: Not on file  Social Connections: Not on file  Intimate Partner Violence: Not on file    Family History  Problem Relation Age of Onset   Colon cancer Neg Hx    Esophageal cancer Neg Hx    Rectal cancer Neg Hx    Stomach cancer Neg Hx    Colon polyps Neg Hx      Review of Systems  Constitutional: Negative.  Negative for chills and fever.  HENT: Negative.  Negative for congestion and sore throat.   Respiratory: Negative.   Negative for cough and shortness of breath.   Cardiovascular: Negative.  Negative for chest pain and palpitations.  Gastrointestinal: Negative.  Negative for abdominal pain, blood in stool, diarrhea, melena, nausea and vomiting.  Genitourinary: Negative.  Negative for dysuria and hematuria.  Skin: Negative.  Negative for rash.  Neurological: Negative.  Negative for dizziness and headaches.  All other systems reviewed and are negative.  Today's Vitals   04/20/21 1521  BP: 138/78  Pulse: 78  Temp: 98.2 F (36.8 C)  TempSrc: Oral  SpO2: 98%  Weight: 197 lb (89.4 kg)  Height: 6\' 5"  (1.956 m)   Body mass index is 23.36 kg/m.  Physical Exam Vitals reviewed.  Constitutional:      Appearance: Normal appearance.  HENT:     Head: Normocephalic.  Eyes:     Extraocular Movements: Extraocular movements intact.  Conjunctiva/sclera: Conjunctivae normal.     Pupils: Pupils are equal, round, and reactive to light.  Cardiovascular:     Rate and Rhythm: Normal rate and regular rhythm.     Pulses: Normal pulses.     Heart sounds: Normal heart sounds.  Pulmonary:     Effort: Pulmonary effort is normal.     Breath sounds: Normal breath sounds.  Musculoskeletal:     Cervical back: Normal range of motion and neck supple.     [name] lower leg: No edema.     Left lower leg: No edema.  Skin:    General: Skin is warm and dry.     Capillary Refill: Capillary refill takes less than 2 seconds.     Comments: Varicose veins to both lower extremities  Neurological:     General: No focal deficit present.     Mental Status: He is alert and oriented to person, place, and time.  Psychiatric:        Mood and Affect: Mood normal.        Behavior: Behavior normal.     ASSESSMENT & PLAN: A total of 30 minutes was spent with the patient and counseling/coordination of care regarding multiple chronic medical problems including atherosclerotic cardiovascular disease and cardiovascular risks associated  with this condition, review of all medications, COPD management and need to stop smoking, smoking cessation advice given, health maintenance items including review of recent colonoscopy which showed diverticulosis, education on nutrition, need to start statin therapy due to diffuse atherosclerotic vascular disease, review of most recent office visit notes, prognosis, documentation and need for follow-up.  Essential hypertension Well-controlled hypertension.  Continue diltiazem 240 mg daily and olmesartan 40 mg daily.  Coronary atherosclerosis of native coronary artery Needs to be started on statin therapy.  We will start rosuvastatin 10 mg daily.  Atherosclerosis of aorta (HCC) We will start rosuvastatin 10 mg daily.  COPD GOLD 2 still smoking  Responding well to Symbicort.  Using albuterol infrequently. States he is smoking 2 to 3 cigarettes a day, which are less than before.  Benign prostatic hyperplasia with urinary obstruction Lower urinary tract symptoms well controlled with alfuzosin 10 mg daily. Arno was seen today for hypertension.  Diagnoses and all orders for this visit:  Essential hypertension  COPD GOLD 2 still smoking   Benign prostatic hyperplasia with urinary obstruction  Atherosclerosis of native coronary artery of native heart without angina pectoris -     rosuvastatin (CRESTOR) 10 MG tablet; Take 1 tablet (10 mg total) by mouth daily.  Atherosclerosis of aorta (HCC) -     rosuvastatin (CRESTOR) 10 MG tablet; Take 1 tablet (10 mg total) by mouth daily.  Need for vaccination for Strep pneumoniae -     Pneumococcal conjugate vaccine 20-valent (Prevnar 20)  Patient Instructions  Preventive Care 45 Years and Older, Male Preventive care refers to lifestyle choices and visits with your health care provider that can promote health and wellness. This includes: A yearly physical exam. This is also called an annual wellness visit. Regular dental and eye  exams. Immunizations. Screening for certain conditions. Healthy lifestyle choices, such as: Eating a healthy diet. Getting regular exercise. Not using drugs or products that contain nicotine and tobacco. Limiting alcohol use. What can I expect for my preventive care visit? Physical exam Your health care provider will check your: Height and weight. These may be used to calculate your BMI (body mass index). BMI is a measurement that tells if you are at  a healthy weight. Heart rate and blood pressure. Body temperature. Skin for abnormal spots. Counseling Your health care provider may ask you questions about your: Past medical problems. Family's medical history. Alcohol, tobacco, and drug use. Emotional well-being. Home life and relationship well-being. Sexual activity. Diet, exercise, and sleep habits. History of falls. Memory and ability to understand (cognition). Work and work Statistician. Access to firearms. What immunizations do I need? Vaccines are usually given at various ages, according to a schedule. Your health care provider will recommend vaccines for you based on your age, medical history, and lifestyle or other factors, such as travel or where you work.   What tests do I need? Blood tests Lipid and cholesterol levels. These may be checked every 5 years, or more often depending on your overall health. Hepatitis C test. Hepatitis B test. Screening Lung cancer screening. You may have this screening every year starting at age 35 if you have a 30-pack-year history of smoking and currently smoke or have quit within the past 15 years. Colorectal cancer screening. All adults should have this screening starting at age 26 and continuing until age 36. Your health care provider may recommend screening at age 38 if you are at increased risk. You will have tests every 1-10 years, depending on your results and the type of screening test. Prostate cancer screening. Recommendations  will vary depending on your family history and other risks. Genital exam to check for testicular cancer or hernias. Diabetes screening. This is done by checking your blood sugar (glucose) after you have not eaten for a while (fasting). You may have this done every 1-3 years. Abdominal aortic aneurysm (AAA) screening. You may need this if you are a current or former smoker. STD (sexually transmitted disease) testing, if you are at risk. Follow these instructions at home: Eating and drinking Eat a diet that includes fresh fruits and vegetables, whole grains, lean protein, and low-fat dairy products. Limit your intake of foods with high amounts of sugar, saturated fats, and salt. Take vitamin and mineral supplements as recommended by your health care provider. Do not drink alcohol if your health care provider tells you not to drink. If you drink alcohol: Limit how much you have to 0-2 drinks a day. Be aware of how much alcohol is in your drink. In the [place] one drink equals one 12 oz bottle of beer (355 mL), one 5 oz glass of wine (148 mL), or one 1 oz glass of hard liquor (44 mL).   Lifestyle Take daily care of your teeth and gums. Brush your teeth every morning and night with fluoride toothpaste. Floss one time each day. Stay active. Exercise for at least 30 minutes 5 or more days each week. Do not use any products that contain nicotine or tobacco, such as cigarettes, e-cigarettes, and chewing tobacco. If you need help quitting, ask your health care provider. Do not use drugs. If you are sexually active, practice safe sex. Use a condom or other form of protection to prevent STIs (sexually transmitted infections). Talk with your health care provider about taking a low-dose aspirin or statin. Find healthy ways to cope with stress, such as: Meditation, yoga, or listening to music. Journaling. Talking to a trusted person. Spending time with friends and family. Safety Always wear your seat  belt while driving or riding in a vehicle. Do not drive: If you have been drinking alcohol. Do not ride with someone who has been drinking. When you are tired or distracted. While  texting. Wear a helmet and other protective equipment during sports activities. If you have firearms in your house, make sure you follow all gun safety procedures. What's next? Visit your health care provider once a year for an annual wellness visit. Ask your health care provider how often you should have your eyes and teeth checked. Stay up to date on all vaccines. This information is not intended to replace advice given to you by your health care provider. Make sure you discuss any questions you have with your health care provider. Document Revised: 07/28/2019 Document Reviewed: 10/23/2018 Elsevier Patient Education  2021 [place], [place] [facility]

## 2021-04-20 NOTE — Patient Instructions (Signed)
Preventive Care 70 Years and Older, Male Preventive care refers to lifestyle choices and visits with your health care provider that can promote health and wellness. This includes:  A yearly physical exam. This is also called an annual wellness visit.  Regular dental and eye exams.  Immunizations.  Screening for certain conditions.  Healthy lifestyle choices, such as: ? Eating a healthy diet. ? Getting regular exercise. ? Not using drugs or products that contain nicotine and tobacco. ? Limiting alcohol use. What can I expect for my preventive care visit? Physical exam Your health care provider will check your:  Height and weight. These may be used to calculate your BMI (body mass index). BMI is a measurement that tells if you are at a healthy weight.  Heart rate and blood pressure.  Body temperature.  Skin for abnormal spots. Counseling Your health care provider may ask you questions about your:  Past medical problems.  Family's medical history.  Alcohol, tobacco, and drug use.  Emotional well-being.  Home life and relationship well-being.  Sexual activity.  Diet, exercise, and sleep habits.  History of falls.  Memory and ability to understand (cognition).  Work and work environment.  Access to firearms. What immunizations do I need? Vaccines are usually given at various ages, according to a schedule. Your health care provider will recommend vaccines for you based on your age, medical history, and lifestyle or other factors, such as travel or where you work.   What tests do I need? Blood tests  Lipid and cholesterol levels. These may be checked every 5 years, or more often depending on your overall health.  Hepatitis C test.  Hepatitis B test. Screening  Lung cancer screening. You may have this screening every year starting at age 55 if you have a 30-pack-year history of smoking and currently smoke or have quit within the past 15 years.  Colorectal  cancer screening. ? All adults should have this screening starting at age 50 and continuing until age 75. ? Your health care provider may recommend screening at age 45 if you are at increased risk. ? You will have tests every 1-10 years, depending on your results and the type of screening test.  Prostate cancer screening. Recommendations will vary depending on your family history and other risks.  Genital exam to check for testicular cancer or hernias.  Diabetes screening. ? This is done by checking your blood sugar (glucose) after you have not eaten for a while (fasting). ? You may have this done every 1-3 years.  Abdominal aortic aneurysm (AAA) screening. You may need this if you are a current or former smoker.  STD (sexually transmitted disease) testing, if you are at risk. Follow these instructions at home: Eating and drinking  Eat a diet that includes fresh fruits and vegetables, whole grains, lean protein, and low-fat dairy products. Limit your intake of foods with high amounts of sugar, saturated fats, and salt.  Take vitamin and mineral supplements as recommended by your health care provider.  Do not drink alcohol if your health care provider tells you not to drink.  If you drink alcohol: ? Limit how much you have to 0-2 drinks a day. ? Be aware of how much alcohol is in your drink. In the [place] one drink equals one 12 oz bottle of beer (355 mL), one 5 oz glass of wine (148 mL), or one 1 oz glass of hard liquor (44 mL).   Lifestyle  Take daily care of your teeth   and gums. Brush your teeth every morning and night with fluoride toothpaste. Floss one time each day.  Stay active. Exercise for at least 30 minutes 5 or more days each week.  Do not use any products that contain nicotine or tobacco, such as cigarettes, e-cigarettes, and chewing tobacco. If you need help quitting, ask your health care provider.  Do not use drugs.  If you are sexually active, practice safe sex.  Use a condom or other form of protection to prevent STIs (sexually transmitted infections).  Talk with your health care provider about taking a low-dose aspirin or statin.  Find healthy ways to cope with stress, such as: ? Meditation, yoga, or listening to music. ? Journaling. ? Talking to a trusted person. ? Spending time with friends and family. Safety  Always wear your seat belt while driving or riding in a vehicle.  Do not drive: ? If you have been drinking alcohol. Do not ride with someone who has been drinking. ? When you are tired or distracted. ? While texting.  Wear a helmet and other protective equipment during sports activities.  If you have firearms in your house, make sure you follow all gun safety procedures. What's next?  Visit your health care provider once a year for an annual wellness visit.  Ask your health care provider how often you should have your eyes and teeth checked.  Stay up to date on all vaccines. This information is not intended to replace advice given to you by your health care provider. Make sure you discuss any questions you have with your health care provider. Document Revised: 07/28/2019 Document Reviewed: 10/23/2018 Elsevier Patient Education  2021 Elsevier Inc.  

## 2021-04-20 NOTE — Assessment & Plan Note (Signed)
Lower urinary tract symptoms well controlled with alfuzosin 10 mg daily.

## 2021-04-20 NOTE — Assessment & Plan Note (Signed)
Well-controlled hypertension.  Continue diltiazem 240 mg daily and olmesartan 40 mg daily.

## 2021-04-20 NOTE — Assessment & Plan Note (Signed)
Needs to be started on statin therapy.  We will start rosuvastatin 10 mg daily.

## 2021-04-21 DIAGNOSIS — C61 Malignant neoplasm of prostate: Secondary | ICD-10-CM | POA: Diagnosis not present

## 2021-05-03 ENCOUNTER — Telehealth: Payer: Self-pay

## 2021-05-03 NOTE — Telephone Encounter (Signed)
Patient notified of appt for this coming Friday.

## 2021-05-03 NOTE — Telephone Encounter (Signed)
Patient called wanted to know his result, from last week.

## 2021-05-04 ENCOUNTER — Encounter: Payer: Self-pay | Admitting: Urology

## 2021-05-05 ENCOUNTER — Other Ambulatory Visit: Payer: Self-pay

## 2021-05-05 ENCOUNTER — Ambulatory Visit (INDEPENDENT_AMBULATORY_CARE_PROVIDER_SITE_OTHER): Payer: Medicare HMO | Admitting: Urology

## 2021-05-05 ENCOUNTER — Encounter: Payer: Self-pay | Admitting: Urology

## 2021-05-05 VITALS — HR 67 | Ht 73.5 in | Wt 195.0 lb

## 2021-05-05 DIAGNOSIS — C61 Malignant neoplasm of prostate: Secondary | ICD-10-CM

## 2021-05-05 NOTE — Patient Instructions (Signed)
Prostate Cancer  The prostate is a male gland that helps make semen. It is located below a man's bladder, in front of the rectum. Prostate cancer is when abnormal cells grow inthis gland. What are the causes? The cause of this condition is not known. What increases the risk? You are more likely to develop this condition if: You are 70 years of age or older. You are African American. You have a family history of prostate cancer. You have a family history of breast cancer. What are the signs or symptoms? Symptoms of this condition include: A need to pee often. Peeing that is weak, or pee that stops and starts. Trouble starting or stopping your pee. Inability to pee. Blood in your pee or semen. Pain in the lower back, lower belly (abdomen), hips, or upper thighs. Trouble getting an erection. Trouble emptying all of your pee. How is this treated? Treatment for this condition depends on your age, your health, the kind of treatment you like, and how far the cancer has spread. Treatments include: Being watched. This is called observation. You will be tested from time to time, but you will not get treated. Tests are to make sure that the cancer is not growing. Surgery. This may be done to remove the prostate, to remove the testicles, or to freeze or kill cancer cells. Radiation. This uses a strong beam to kill cancer cells. Ultrasound energy. This uses strong sound waves to kill cancer cells. Chemotherapy. This uses medicines that stop cancer cells from increasing. This kills cancer cells and healthy cells. Targeted therapy. This kills cancer cells only. Healthy cells are not affected. Hormone treatment. This stops the body from making hormones that help the cancer cells to grow. Follow these instructions at home: Take over-the-counter and prescription medicines only as told by your doctor. Eat a healthy diet. Get plenty of sleep. Ask your doctor for help to find a support group for men  with prostate cancer. If you have to go to the hospital, let your cancer doctor (oncologist) know. Treatment may affect your ability to have sex. Touch, hold, hug, and caress your partner to have intimate moments. Keep all follow-up visits as told by your doctor. This is important. Contact a doctor if: You have new or more trouble peeing. You have new or more blood in your pee. You have new or more pain in your hips, back, or chest. Get help right away if: You have weakness in your legs. You lose feeling in your legs. You cannot control your pee or your poop (stool). You have chills or a fever. Summary The prostate is a male gland that helps make semen. Prostate cancer is when abnormal cells grow in this gland. Treatment includes doing surgery, using medicines, using very strong beams, or watching without treatment. Ask your doctor for help to find a support group for men with prostate cancer. Contact a doctor if you have problems peeing or have any new pain that you did not have before. This information is not intended to replace advice given to you by your health care provider. Make sure you discuss any questions you have with your healthcare provider. Document Revised: 12/01/2020 Document Reviewed: 10/13/2019 Elsevier Patient Education  2022 Elsevier Inc.  

## 2021-05-05 NOTE — Progress Notes (Signed)
05/05/2021 3:37 PM   Andrew [name] 12, 1952 [id number]  Referring provider: Horald Pollen, MD [place],  [place] [address]  Followup prostate biopsy   HPI: Andrew [name] is a 70yo here for followup after MRI fusion prostate biopsy. Biopsy revealed Gleason 3+4=7 in 4/4 cores from right lateral gland ROI,. PSA 20.5   PMH: Past Medical History:  Diagnosis Date   Asthma    Carotid artery stenosis, asymptomatic, bilateral 12/06/2017   Consider recheck Dopplers in 6 months -> 05/2018 Mild to moderate bilateral carotid artery stenosis Carotid Dopplers done at [facility] cardiovascular 11/26/2017 show left internal carotid artery stenosis 50-69% with soft plaque.  Mild stenosis in the left common carotid artery <50%. Mild stenosis in the right common carotid artery <50% with moderate diffuse soft plaque.  Mild stenosis in the right e   COCAINE ABUSE 01/30/2008   Qualifier: Diagnosis of  By: Amil Amen MD, Benjamine Mola     COPD (chronic obstructive pulmonary disease) ([place])    Hypertension     Surgical History: Past Surgical History:  Procedure Laterality Date   NO PAST SURGERIES      Home Medications:  Allergies as of 05/05/2021       Reactions   Lisinopril Cough   Lisinopril-hydrochlorothiazide    dizziness        Medication List        Accurate as of May 05, 2021  3:37 PM. If you have any questions, ask your nurse or doctor.          albuterol 108 (90 Base) MCG/ACT inhaler Commonly known as: VENTOLIN HFA Inhale 2 puffs into the lungs every 4 (four) hours as needed for wheezing or shortness of breath (cough, shortness of breath or wheezing.).   albuterol (2.5 MG/3ML) 0.083% nebulizer solution Commonly known as: PROVENTIL Take 3 mLs (2.5 mg total) by nebulization every 6 (six) hours as needed for wheezing or shortness of breath.   alfuzosin 10 MG 24 hr tablet Commonly known as: UROXATRAL Take 1 tablet (10 mg total) by mouth daily with breakfast.    Breztri Aerosphere 160-9-4.8 MCG/ACT Aero Generic drug: Budeson-Glycopyrrol-Formoterol Inhale 2 puffs into the lungs in the morning and at bedtime.   budesonide-formoterol 160-4.5 MCG/ACT inhaler Commonly known as: SYMBICORT Inhale 2 puffs into the lungs 2 (two) times daily.   cetirizine 10 MG tablet Commonly known as: ZYRTEC TAKE 1 TABLET (10 MG TOTAL) BY MOUTH DAILY.   diltiazem 240 MG 24 hr capsule Commonly known as: Dilt-XR Take 1 capsule (240 mg total) by mouth daily.   guaiFENesin 600 MG 12 hr tablet Commonly known as: Mucinex Take 1 tablet (600 mg total) by mouth 2 (two) times daily as needed for cough or to loosen phlegm.   olmesartan 40 MG tablet Commonly known as: BENICAR Take 1 tablet (40 mg total) by mouth daily.   rosuvastatin 10 MG tablet Commonly known as: Crestor Take 1 tablet (10 mg total) by mouth daily.   tadalafil 20 MG tablet Commonly known as: CIALIS Take 1 tablet (20 mg total) by mouth daily as needed for erectile dysfunction.   triamcinolone cream 0.1 % Commonly known as: KENALOG APPLY 1 APPLICATION TOPICALLY 2 (TWO) TIMES DAILY.        Allergies:  Allergies  Allergen Reactions   Lisinopril Cough   Lisinopril-Hydrochlorothiazide     dizziness    Family History: Family History  Problem Relation Age of Onset   Colon cancer Neg Hx    Esophageal cancer  Neg Hx    Rectal cancer Neg Hx    Stomach cancer Neg Hx    Colon polyps Neg Hx     Social History:  reports that he has been smoking cigarettes. He has a 11.00 pack-year smoking history. He has never used smokeless tobacco. He reports current alcohol use of about 2.0 standard drinks of alcohol per week. He reports that he does not use drugs.  ROS: All other review of systems were reviewed and are negative except what is noted above in HPI  Physical Exam: Pulse 67   Ht 6' 1.5" (1.867 m)   Wt 195 lb (88.5 kg)   BMI 25.38 kg/m   Constitutional:  Alert and oriented, No acute  distress. HEENT: [place] AT, moist mucus membranes.  Trachea midline, no masses. Cardiovascular: No clubbing, cyanosis, or edema. Respiratory: Normal respiratory effort, no increased work of breathing. GI: Abdomen is soft, nontender, nondistended, no abdominal masses GU: No CVA tenderness.  Lymph: No cervical or inguinal lymphadenopathy. Skin: No rashes, bruises or suspicious lesions. Neurologic: Grossly intact, no focal deficits, moving all 4 extremities. Psychiatric: Normal mood and affect.  Laboratory Data: Lab Results  Component Value Date   WBC 6.3 07/08/2018   HGB 14.2 07/08/2018   HCT 41.8 07/08/2018   MCV 85.3 07/08/2018   PLT 268.0 07/08/2018    Lab Results  Component Value Date   CREATININE 1.61 (H) 10/02/2019    Lab Results  Component Value Date   PSA 5.4 (H) 09/14/2016   PSA 5.58 (H) 01/24/2016    No results found for: TESTOSTERONE  Lab Results  Component Value Date   HGBA1C 5.6 10/02/2019    Urinalysis    Component Value Date/Time   APPEARANCEUR Clear 01/09/2021 1551   GLUCOSEU Negative 01/09/2021 1551   BILIRUBINUR Negative 01/09/2021 1551   KETONESUR negative 10/17/2017 1615   PROTEINUR Negative 01/09/2021 1551   UROBILINOGEN 0.2 10/17/2017 1615   NITRITE Negative 01/09/2021 1551   LEUKOCYTESUR Negative 01/09/2021 1551    Lab Results  Component Value Date   LABMICR Comment 01/09/2021   WBCUA None seen 07/27/2020   LABEPIT None seen 07/27/2020   BACTERIA None seen 07/27/2020    Pertinent Imaging:  No results found for this or any previous visit.  No results found for this or any previous visit.  No results found for this or any previous visit.  No results found for this or any previous visit.  Results for orders placed during the hospital encounter of 05/27/20  US RENAL  Narrative CLINICAL DATA:  CKD stage 3  EXAM: RENAL / URINARY TRACT ULTRASOUND COMPLETE  COMPARISON:  Chest CT acquired on September 25, 2019, limited assessment  of the kidneys at that time.  FINDINGS: Right Kidney:  Renal measurements: 10.7 x 4.0 x 4.5 cm = volume: 102 mL. Small hypoechoic to anechoic focus in the upper pole of the RIGHT kidney shows some central increased echogenicity and perhaps multi septate changes.  3.9 x 3.3 x 2.7 cm anechoic cyst with adjacent smaller cystic area 1.2 x 1.3 x 1.3 cm cannot be classified as a simple cyst and does not show overt signs of flow. No hydronephrosis. Decreased corticomedullary differentiation.  Left Kidney:  Renal measurements: 10.6 x 5.7 x 4.8 cm = volume: 151 mL. Decreased corticomedullary differentiation without hydronephrosis. Small mildly complex areas with multi septate or spongiform appearance in the upper pole and interpolar portion largest measuring 1.6 x 1.4 x 1.5 cm with smaller areas, less than 10  also showing some cystic characteristics.  Bladder:  Appears normal for degree of bladder distention.  Other:  None.  IMPRESSION: 1. Decreased corticomedullary differentiation without hydronephrosis. Findings can be seen in the setting of medical renal disease. 2. Multiple cystic lesions in the bilateral kidneys without renal enlargement. Some of these areas display internal complexity/variable echogenicity. MRI may be helpful for further evaluation to exclude the presence of enhancing components/small cystic renal neoplasm.  These results will be called to the ordering clinician or representative by the Radiologist Assistant, and communication documented in the PACS or [employer].   Electronically Signed By: Zetta Bills M.D. On: 05/30/2020 10:55  No results found for this or any previous visit.  No results found for this or any previous visit.  No results found for this or any previous visit.   Assessment & Plan:    1. Prostate cancer [facility]) I discussed the natural history of favorable intermediate risk prostate cancer with the patient and the various  treatment options including active surveillance, RALP, IMRT, brachytherapy, cryotherapy, HIFU and ADT. I will refer the patient to Dr. Tresa Moore for consideration of RALP and Dr. Tammi Klippel of radiation therapy.   No follow-ups on file.  Nicolette Bang, MD  [facility] [place]

## 2021-05-05 NOTE — Progress Notes (Signed)
rological Symptom Review  Patient is experiencing the following symptoms:  Biopsy Discussion   Review of Systems  Gastrointestinal (upper)  : Negative for upper GI symptoms  Gastrointestinal (lower) : Negative for lower GI symptoms  Constitutional : Negative for symptoms  Skin: Negative for skin symptoms  Eyes: Negative for eye symptoms  Ear/Nose/Throat : Negative for Ear/Nose/Throat symptoms  Hematologic/Lymphatic: Negative for Hematologic/Lymphatic symptoms  Cardiovascular : Negative for cardiovascular symptoms  Respiratory : Negative for respiratory symptoms  Endocrine: Negative for endocrine symptoms  Musculoskeletal: Negative for musculoskeletal symptoms  Neurological: Negative for neurological symptoms  Psychologic: Negative for psychiatric symptoms

## 2021-05-05 NOTE — Addendum Note (Signed)
Addended by: Pollyann Kennedy F on: 05/05/2021 03:45 PM   Modules accepted: Orders

## 2021-05-06 LAB — BASIC METABOLIC PANEL
BUN/Creatinine Ratio: 11 (ref 10–24)
BUN: 20 mg/dL (ref 8–27)
CO2: 24 mmol/L (ref 20–29)
Calcium: 9.5 mg/dL (ref 8.6–10.2)
Chloride: 107 mmol/L — ABNORMAL HIGH (ref 96–106)
Creatinine, Ser: 1.81 mg/dL — ABNORMAL HIGH (ref 0.76–1.27)
Glucose: 89 mg/dL (ref 65–99)
Potassium: 4.5 mmol/L (ref 3.5–5.2)
Sodium: 145 mmol/L — ABNORMAL HIGH (ref 134–144)
eGFR: 40 mL/min/{1.73_m2} — ABNORMAL LOW (ref >59)

## 2021-05-08 DIAGNOSIS — K620 Anal polyp: Secondary | ICD-10-CM | POA: Diagnosis not present

## 2021-05-11 ENCOUNTER — Other Ambulatory Visit: Payer: Self-pay | Admitting: Primary Care

## 2021-05-11 ENCOUNTER — Other Ambulatory Visit: Payer: Self-pay | Admitting: Urology

## 2021-05-24 ENCOUNTER — Encounter: Payer: Self-pay | Admitting: Emergency Medicine

## 2021-05-24 ENCOUNTER — Ambulatory Visit (INDEPENDENT_AMBULATORY_CARE_PROVIDER_SITE_OTHER): Payer: Medicare HMO | Admitting: Emergency Medicine

## 2021-05-24 ENCOUNTER — Other Ambulatory Visit: Payer: Self-pay

## 2021-05-24 VITALS — BP 126/60 | HR 74 | Temp 98.6°F | Ht 72.0 in | Wt 195.0 lb

## 2021-05-24 DIAGNOSIS — N1831 Chronic kidney disease, stage 3a: Secondary | ICD-10-CM | POA: Diagnosis not present

## 2021-05-24 DIAGNOSIS — Z01818 Encounter for other preprocedural examination: Secondary | ICD-10-CM

## 2021-05-24 DIAGNOSIS — R7303 Prediabetes: Secondary | ICD-10-CM | POA: Diagnosis not present

## 2021-05-24 DIAGNOSIS — J449 Chronic obstructive pulmonary disease, unspecified: Secondary | ICD-10-CM | POA: Diagnosis not present

## 2021-05-24 DIAGNOSIS — K620 Anal polyp: Secondary | ICD-10-CM

## 2021-05-24 DIAGNOSIS — I2510 Atherosclerotic heart disease of native coronary artery without angina pectoris: Secondary | ICD-10-CM | POA: Diagnosis not present

## 2021-05-24 DIAGNOSIS — R9431 Abnormal electrocardiogram [ECG] [EKG]: Secondary | ICD-10-CM | POA: Diagnosis not present

## 2021-05-24 DIAGNOSIS — I10 Essential (primary) hypertension: Secondary | ICD-10-CM

## 2021-05-24 DIAGNOSIS — I700 Atherosclerosis of aorta: Secondary | ICD-10-CM

## 2021-05-24 DIAGNOSIS — Z8546 Personal history of malignant neoplasm of prostate: Secondary | ICD-10-CM

## 2021-05-24 NOTE — Progress Notes (Signed)
Subjective:    Andrew [name] is a 70 y.o. male who presents to the office today for a preoperative consultation at the request of surgeon Andrew [name] surgery who plans on performing transanal excision of anal canal on to be determined date.  This consultation is requested for the specific conditions prompting preoperative evaluation (i.e. because of potential affect on operative risk): Hypertension, COPD, dyslipidemia, atherosclerosis, coronary artery disease. Planned anesthesia: general. The patient has the following known anesthesia issues:  None . Patients bleeding risk: no recent abnormal bleeding. Patient does not have objections to receiving blood products if needed.  The following portions of the patient's history were reviewed and updated as appropriate: allergies, current medications, past family history, past medical history, past social history, past surgical history, and problem list.  Review of Systems Pertinent items noted in HPI and remainder of comprehensive ROS otherwise negative.  Review of systems positive for dyspnea on exertion.   Objective:    BP 126/60   Pulse 74   Temp 98.6 F (37 C) (Oral)   Ht 6' (1.829 m)   Wt 195 lb (88.5 kg)   SpO2 96%   BMI 26.45 kg/m   General Appearance:    Alert, cooperative, no distress, appears stated age  Head:    Normocephalic, without obvious abnormality, atraumatic  Eyes:    PERRL, conjunctiva/corneas clear, EOM's intact     Ears:    Normal TM's and external ear canals, both ears  Nose:   Nares normal, septum midline, mucosa normal, no drainage    or sinus tenderness  Throat:   Lips, mucosa, and tongue normal; teeth and gums normal  Neck:   Supple, symmetrical, trachea midline, no adenopathy;       thyroid:  No enlargement/tenderness/nodules; no carotid   bruit or JVD  Back:     Symmetric, no curvature, ROM normal, no CVA tenderness  Lungs:     Clear to auscultation bilaterally, respirations unlabored  Chest wall:     No tenderness or deformity  Heart:    Regular rate and rhythm, S1 and S2 normal, no murmur, rub   or gallop  Abdomen:     Soft, non-tender, bowel sounds active all four quadrants,    no masses, no organomegaly        Extremities:   Extremities normal, atraumatic, no cyanosis or edema  Pulses:   2+ and symmetric all extremities  Skin:   Skin color, texture, turgor normal, no rashes or lesions  Lymph nodes:   Cervical, supraclavicular, and axillary nodes normal  Neurologic:   CNII-XII intact. Normal strength, sensation and reflexes      throughout    Predictors of intubation difficulty:  Morbid obesity? no  Anatomically abnormal facies? no  Prominent incisors? no  Receding mandible? no  Short, thick neck? no  Neck range of motion: normal   Cardiographics ECG:  Normal sinus rhythm with first-degree AV block and ventricular rate of 94/min.  T wave inversion in 1, aVL, V5 and V6.  LVH by voltage criteria.  Abnormal EKG.   Lab Review     Assessment:      70 y.o. male with planned surgery as above.   Known risk factors for perioperative complications: Coronary disease Chronic pulmonary disease Renal dysfunction   Difficulty with intubation is not anticipated.  Cardiac Risk Estimation: High.  Patient will need further cardiology evaluation for final clearance. Andrew [name] was seen today for pre-op exam.  Diagnoses and all orders for this visit:  Pre-operative clearance -     EKG 12-Lead -     CBC with Differential/Platelet -     Comprehensive metabolic panel -     Hemoglobin A1c -     Ambulatory referral to Cardiology  Preoperative examination -     CBC with Differential/Platelet -     Comprehensive metabolic panel -     Hemoglobin A1c  Abnormal EKG -     Ambulatory referral to Cardiology  Essential hypertension -     Ambulatory referral to Cardiology  COPD GOLD 2 still smoking   Atherosclerosis of aorta ([place]) -     Ambulatory referral to  Cardiology  Atherosclerosis of native coronary artery of native heart without angina pectoris -     Ambulatory referral to Cardiology  Chronic kidney disease (CKD) stage G3a/A2, moderately decreased glomerular filtration rate (GFR) between 45-59 mL/min/1.73 square meter and albuminuria creatinine ratio between 30-299 mg/g (HCC)  Prediabetes  History of prostate cancer  Anal polyp     Plan:    1. Preoperative workup as follows ECG, hemoglobin, hematocrit, electrolytes, creatinine. 2. Change in medication regimen before surgery: none, continue medication regimen including morning of surgery, with sip of water. 3. Prophylaxis for cardiac events with perioperative beta-blockers: As per cardiology. 4. Invasive hemodynamic monitoring perioperatively: at the discretion of anesthesiologist. 5. Deep vein thrombosis prophylaxis postoperatively:regimen to be chosen by surgical team. 6. Surveillance for postoperative MI with ECG immediately postoperatively and on postoperative days 1 and 2 AND troponin levels 24 hours postoperatively and on day 4 or hospital discharge (whichever comes first): at the discretion of anesthesiologist. 7. Other measures:  As per cardiology

## 2021-05-24 NOTE — Progress Notes (Signed)
GU Location of Tumor / Histology: Prostate  If Prostate Cancer, Gleason Score is (3 + 4), PSA (20.5), and Prostate volume (36.22ml)  [name] presented  months ago with signs/symptoms of:   Biopsies revealed:  02/17/2021  EXAM: MR PROSTATE WITHOUT AND WITH CONTRAST   TECHNIQUE: Multiplanar multisequence MRI images were obtained of the pelvis centered about the prostate. Pre and post contrast images were obtained.   CONTRAST:  19mL MULTIHANCE GADOBENATE DIMEGLUMINE 529 MG/ML IV SOLN   COMPARISON:  None.   FINDINGS: Prostate: 2.4 x 1.1 x 1.9 cm geographic T2 lesion in the left posterolateral peripheral zone, apex to base (series 9/image 13). Precontrast T1 hyperintensity is present in this region (series 11/image 10), indicating some degree of hemorrhage/protein. However, superimposed early arterial enhancement is suspected, at least within the mid gland posteriorly (series 12/image 191). Associated foci of moderate restricted diffusion/low ADC (series 6-7/image 12). This appearance is considered suspicious for macroscopic prostate cancer (PI-RADS 5); correlate with the patient's known biopsy-proven tumor. This has been marked as ROI2 in [employer].   1.6 x 0.9 x 0.8 cm smudgy T2 lesion in the left anterior apex of the central gland, equivocal. Moderate restricted diffusion/low ADC (series 6-7/image 12). Possible early arterial enhancement (series 12/image 154), although equivocal. This could reflect a focus of central gland tumor (PI-RADS 4); correlate with the patient's known biopsy-proven tumor. This has been marked as ROI1 in [employer].   Volume: 5.0 x 3.6 x 4.0 cm (calculated volume 36.8 mL)   Transcapsular spread:  Absent   Seminal vesicle involvement: Absent   Neurovascular bundle involvement: Absent   Pelvic adenopathy: Absent   Bone metastasis: Absent   Other findings: Tiny fat containing right inguinal hernia.   IMPRESSION: 2.4 cm  geographic lesion in the left posterolateral peripheral zone, suspicious for macroscopic prostate cancer, although some of this appearance may reflect post biopsy hemorrhage/protein. PI-RADS 5. Correlate with the patient's known biopsy-proven tumor.   Additional 1.6 cm lesion in the left anterior apex of the central gland, possibly reflecting central gland tumor, although equivocal. PI-RADS 4.   No evidence of extracapsular extension, seminal vesicle invasion, lymphadenopathy, or metastatic disease.   Calculated prostate volume 36.8 mL. Past/Anticipated interventions by urology, if any:   Past/Anticipated interventions by medical oncology, if any:   Weight changes, if any: None  IPSS Score: 13 SHIM Score:15  Bowel/Bladder complaints, if any: no   Nausea/Vomiting, if any: no  Pain issues, if any:  no  SAFETY ISSUES: Prior radiation? no Pacemaker/ICD? no Possible current pregnancy? no Is the patient on methotrexate? no  Current Complaints / other details: Patient denies any dysuria. Reports some hematuria. Reports some urgency. Denies having to push or strain to start stream. Nocturia x2. Vitals:   05/30/21 0817  BP: (!) 146/62  Pulse: 72  Resp: 20  Temp: 97.8 F (36.6 C)  SpO2: 98%  Weight: 90.1 kg  Height: 6\' 1"  (1.854 m)

## 2021-05-24 NOTE — Patient Instructions (Signed)
Need to follow-up with cardiology for further evaluation and preop clearance. Health Maintenance After Age 70 After age 51, you are at a higher risk for certain long-term diseases and infections as well as injuries from falls. Falls are a major cause of broken bones and head injuries in people who are older than age 32. Getting regular preventive care can help to keep you healthy and well. Preventive care includes getting regular testing and making lifestyle changes as recommended by your health care provider. Talk with your health care provider about: Which screenings and tests you should have. A screening is a test that checks for a disease when you have no symptoms. A diet and exercise plan that is right for you. What should I know about screenings and tests to prevent falls? Screening and testing are the best ways to find a health problem early. Early diagnosis and treatment give you the best chance of managing medical conditions that are common after age 10. Certain conditions and lifestyle choices may make you more likely to have a fall. Your health care provider may recommend: Regular vision checks. Poor vision and conditions such as cataracts can make you more likely to have a fall. If you wear glasses, make sure to get your prescription updated if your vision changes. Medicine review. Work with your health care provider to regularly review all of the medicines you are taking, including over-the-counter medicines. Ask your health care provider about any side effects that may make you more likely to have a fall. Tell your health care provider if any medicines that you take make you feel dizzy or sleepy. Osteoporosis screening. Osteoporosis is a condition that causes the bones to get weaker. This can make the bones weak and cause them to break more easily. Blood pressure screening. Blood pressure changes and medicines to control blood pressure can make you feel dizzy. Strength and balance checks.  Your health care provider may recommend certain tests to check your strength and balance while standing, walking, or changing positions. Foot health exam. Foot pain and numbness, as well as not wearing proper footwear, can make you more likely to have a fall. Depression screening. You may be more likely to have a fall if you have a fear of falling, feel emotionally low, or feel unable to do activities that you used to do. Alcohol use screening. Using too much alcohol can affect your balance and may make you more likely to have a fall. What actions can I take to lower my risk of falls? General instructions Talk with your health care provider about your risks for falling. Tell your health care provider if: You fall. Be sure to tell your health care provider about all falls, even ones that seem minor. You feel dizzy, sleepy, or off-balance. Take over-the-counter and prescription medicines only as told by your health care provider. These include any supplements. Eat a healthy diet and maintain a healthy weight. A healthy diet includes low-fat dairy products, low-fat (lean) meats, and fiber from whole grains, beans, and lots of fruits and vegetables. Home safety Remove any tripping hazards, such as rugs, cords, and clutter. Install safety equipment such as grab bars in bathrooms and safety rails on stairs. Keep rooms and walkways well-lit. Activity  Follow a regular exercise program to stay fit. This will help you maintain your balance. Ask your health care provider what types of exercise are appropriate for you. If you need a cane or walker, use it as recommended by your health care  provider. Wear supportive shoes that have nonskid soles.  Lifestyle Do not drink alcohol if your health care provider tells you not to drink. If you drink alcohol, limit how much you have: 0-1 drink a day for women. 0-2 drinks a day for men. Be aware of how much alcohol is in your drink. In the [place] one drink  equals one typical bottle of beer (12 oz), one-half glass of wine (5 oz), or one shot of hard liquor (1 oz). Do not use any products that contain nicotine or tobacco, such as cigarettes and e-cigarettes. If you need help quitting, ask your health care provider. Summary Having a healthy lifestyle and getting preventive care can help to protect your health and wellness after age 71. Screening and testing are the best way to find a health problem early and help you avoid having a fall. Early diagnosis and treatment give you the best chance for managing medical conditions that are more common for people who are older than age 77. Falls are a major cause of broken bones and head injuries in people who are older than age 18. Take precautions to prevent a fall at home. Work with your health care provider to learn what changes you can make to improve your health and wellness and to prevent falls. This information is not intended to replace advice given to you by your health care provider. Make sure you discuss any questions you have with your healthcare provider. Document Revised: 10/14/2020 Document Reviewed: 10/14/2020 Elsevier Patient Education  2022 [employer].

## 2021-05-25 ENCOUNTER — Ambulatory Visit: Payer: Medicare HMO | Admitting: Emergency Medicine

## 2021-05-25 ENCOUNTER — Ambulatory Visit (HOSPITAL_COMMUNITY)
Admission: RE | Admit: 2021-05-25 | Discharge: 2021-05-25 | Disposition: A | Discharge status: Home / Self Care | Payer: Medicare HMO | Source: Ambulatory Visit | Attending: Urology | Admitting: Urology

## 2021-05-25 ENCOUNTER — Encounter (HOSPITAL_COMMUNITY)
Admission: RE | Admit: 2021-05-25 | Discharge: 2021-05-25 | Disposition: A | Discharge status: Home / Self Care | Payer: Medicare HMO | Source: Ambulatory Visit | Attending: Urology | Admitting: Urology

## 2021-05-25 DIAGNOSIS — M25512 Pain in left shoulder: Secondary | ICD-10-CM | POA: Diagnosis not present

## 2021-05-25 DIAGNOSIS — C61 Malignant neoplasm of prostate: Secondary | ICD-10-CM

## 2021-05-25 DIAGNOSIS — N281 Cyst of kidney, acquired: Secondary | ICD-10-CM | POA: Diagnosis not present

## 2021-05-25 DIAGNOSIS — M25511 Pain in right shoulder: Secondary | ICD-10-CM | POA: Diagnosis not present

## 2021-05-25 DIAGNOSIS — M5136 Other intervertebral disc degeneration, lumbar region: Secondary | ICD-10-CM | POA: Diagnosis not present

## 2021-05-25 DIAGNOSIS — M19041 Primary osteoarthritis, right hand: Secondary | ICD-10-CM | POA: Diagnosis not present

## 2021-05-25 DIAGNOSIS — K4090 Unilateral inguinal hernia, without obstruction or gangrene, not specified as recurrent: Secondary | ICD-10-CM | POA: Diagnosis not present

## 2021-05-25 MED ORDER — SODIUM CHLORIDE (PF) 0.9 % IJ SOLN
INTRAMUSCULAR | Status: AC
  Filled 2021-05-25: qty 50

## 2021-05-25 MED ORDER — TECHNETIUM TC 99M MEDRONATE IV KIT
18.7000 | PACK | Freq: Once | INTRAVENOUS | Status: AC
  Administered 2021-05-25: 18.7 via INTRAVENOUS

## 2021-05-25 MED ORDER — IOHEXOL 350 MG/ML SOLN
100.0000 mL | Freq: Once | INTRAVENOUS | Status: AC | PRN
  Administered 2021-05-25: 75 mL via INTRAVENOUS

## 2021-05-29 NOTE — Progress Notes (Signed)
Radiation Oncology         [phone] ________________________________  Initial Outpatient Consultation  Name: Andrew [name] MRN: [id number]  Date: 05/30/2021  DOB: 05-Apr-1951  SW:FUXNATFT, Ines Bloomer, MD  McKenzie, Candee Furbish, MD   REFERRING PHYSICIAN: Cleon Gustin, MD  DIAGNOSIS: 70 y.o. gentleman with Stage T1c adenocarcinoma of the prostate with Gleason score of 3+4, and PSA of 20.5.    ICD-10-CM   1. Prostate cancer [facility])  C61       HISTORY OF PRESENT ILLNESS: Andrew [name] is a 70 y.o. male with a diagnosis of prostate cancer. He was initially diagnosed with low volume Gleason 3+3 prostate cancer on 12/14/16 by Dr. Alyson Ingles, PSA at that time was 6.4. Surveillance biopsies in 06/2017 and 05/2019 again showed low volume Gleason 3+3 disease in 1/12 biopsies.  His PSA continued to increase and reached 20.5 in 12/2020. He underwent a surveillance prostate MRI on 02/17/21 showing a 2.4 cm PI-RADS 5 lesion in the left posterolateral peripheral zone as well as a 1.6 cm PI-RADS 4 lesion in the the left anterior apex of the central gland with no evidence of extracapsular extension, seminal vesicle invasion, lymphadenopathy, or metastatic disease. The prostate volume was estimated at 36.8 mL.  The patient proceeded to MRI fusion biopsies of the ROI lesions on 04/21/21.  The prostate volume measured 40.7 cc by ultrasound.  Out of 8 core biopsies, all 4 from ROI lesion #1 were positive.  The maximum Gleason score was 3+4, and this was seen in all 4 positive cores.  He underwent staging CT and bone scan on 05/25/21. CT A/P showed no acute findings; no mass or adenopathy to suggest metastatic disease. Bone scan showed mild uptake in the humeral heads bilaterally is likely degenerative with no other significant abnormalities. No evidence of bony metastatic disease.  The patient reviewed the biopsy results with his urologist and he has kindly been referred today for discussion of potential  radiation treatment options. He is also scheduled to meet with Dr. Tresa Moore on 06/26/21 to discuss RALP.   PREVIOUS RADIATION THERAPY: No  PAST MEDICAL HISTORY:  Past Medical History:  Diagnosis Date   Asthma    Carotid artery stenosis, asymptomatic, bilateral 12/06/2017   Consider recheck Dopplers in 6 months -> 05/2018 Mild to moderate bilateral carotid artery stenosis Carotid Dopplers done at [facility] cardiovascular 11/26/2017 show left internal carotid artery stenosis 50-69% with soft plaque.  Mild stenosis in the left common carotid artery <50%. Mild stenosis in the right common carotid artery <50% with moderate diffuse soft plaque.  Mild stenosis in the right e   COCAINE ABUSE 01/30/2008   Qualifier: Diagnosis of  By: Amil Amen MD, Benjamine Mola     COPD (chronic obstructive pulmonary disease) ([place])    Hypertension       PAST SURGICAL HISTORY: Past Surgical History:  Procedure Laterality Date   NO PAST SURGERIES      FAMILY HISTORY:  Family History  Problem Relation Age of Onset   Colon cancer Neg Hx    Esophageal cancer Neg Hx    Rectal cancer Neg Hx    Stomach cancer Neg Hx    Colon polyps Neg Hx     SOCIAL HISTORY:  Social History   Socioeconomic History   Marital status: Married    Spouse name: Not on file   Number of children: 1   Years of education: Not on file   Highest education level: Not on file  Occupational History  Not on file  Tobacco Use   Smoking status: Some Days    Packs/day: 0.25    Years: 44.00    Pack years: 11.00    Types: Cigarettes   Smokeless tobacco: Never  Vaping Use   Vaping Use: Never used  Substance and Sexual Activity   Alcohol use: Yes    Alcohol/week: 2.0 standard drinks    Types: 1 Cans of beer, 1 Shots of liquor per week    Comment: occasional   Drug use: No   Sexual activity: Not on file  Other Topics Concern   Not on file  Social History Narrative   Not on file   Social Determinants of Health   Financial Resource  Strain: Not on file  Food Insecurity: Not on file  Transportation Needs: Not on file  Physical Activity: Not on file  Stress: Not on file  Social Connections: Not on file  Intimate Partner Violence: Not on file    ALLERGIES: Lisinopril and Lisinopril-hydrochlorothiazide  MEDICATIONS:  Current Outpatient Medications  Medication Sig Dispense Refill   albuterol (PROVENTIL HFA;VENTOLIN HFA) 108 (90 Base) MCG/ACT inhaler Inhale 2 puffs into the lungs every 4 (four) hours as needed for wheezing or shortness of breath (cough, shortness of breath or wheezing.). 1 Inhaler 11   albuterol (PROVENTIL) (2.5 MG/3ML) 0.083% nebulizer solution Take 3 mLs (2.5 mg total) by nebulization every 6 (six) hours as needed for wheezing or shortness of breath. 150 mL 2   alfuzosin (UROXATRAL) 10 MG 24 hr tablet Take 1 tablet (10 mg total) by mouth daily with breakfast. 90 tablet 3   Budeson-Glycopyrrol-Formoterol (BREZTRI AEROSPHERE) 160-9-4.8 MCG/ACT AERO Inhale 2 puffs into the lungs in the morning and at bedtime. (Patient not taking: No sig reported) 10.7 g 0   budesonide-formoterol (SYMBICORT) 160-4.5 MCG/ACT inhaler Inhale 2 puffs into the lungs 2 (two) times daily.     cetirizine (ZYRTEC) 10 MG tablet TAKE 1 TABLET (10 MG TOTAL) BY MOUTH DAILY. 90 tablet 4   diltiazem (DILT-XR) 240 MG 24 hr capsule Take 1 capsule (240 mg total) by mouth daily. 90 capsule 3   fluticasone (FLONASE) 50 MCG/ACT nasal spray Place 1 spray into both nostrils daily. 32 g 3   guaiFENesin (MUCINEX) 600 MG 12 hr tablet Take 1 tablet (600 mg total) by mouth 2 (two) times daily as needed for cough or to loosen phlegm. (Patient not taking: No sig reported) 60 tablet 2   olmesartan (BENICAR) 40 MG tablet Take 1 tablet (40 mg total) by mouth daily. 90 tablet 3   rosuvastatin (CRESTOR) 10 MG tablet Take 1 tablet (10 mg total) by mouth daily. 90 tablet 3   tadalafil (CIALIS) 20 MG tablet Take 1 tablet (20 mg total) by mouth daily as needed for  erectile dysfunction. 10 tablet 5   triamcinolone (KENALOG) 0.1 % APPLY 1 APPLICATION TOPICALLY 2 (TWO) TIMES DAILY. 80 g 0   No current facility-administered medications for this encounter.    REVIEW OF SYSTEMS:  On review of systems, the patient reports that he is doing well overall. He denies any chest pain, shortness of breath, cough, fevers, chills, night sweats, unintended weight changes. He denies any bowel disturbances, and denies abdominal pain, nausea or vomiting. He denies any new musculoskeletal or joint aches or pains. His IPSS was 13, indicating moderate urinary symptoms with frequency, urgency, and nocturia x2. His SHIM was 15, indicating he has moderate erectile dysfunction. A complete review of systems is obtained and is otherwise  negative.    PHYSICAL EXAM:  Wt Readings from Last 3 Encounters:  05/24/21 195 lb (88.5 kg)  05/05/21 195 lb (88.5 kg)  04/20/21 197 lb (89.4 kg)   Temp Readings from Last 3 Encounters:  05/24/21 98.6 F (37 C) (Oral)  04/20/21 98.2 F (36.8 C) (Oral)  03/28/21 97.8 F (36.6 C) (Temporal)   BP Readings from Last 3 Encounters:  05/24/21 126/60  04/20/21 138/78  03/28/21 104/62   Pulse Readings from Last 3 Encounters:  05/24/21 74  05/05/21 67  04/20/21 78    /10  In general this is a well appearing African-American male in no acute distress. He's alert and oriented x4 and appropriate throughout the examination. Cardiopulmonary assessment is negative for acute distress, and he exhibits normal effort.     KPS = 100  100 - Normal; no complaints; no evidence of disease. 90   - Able to carry on normal activity; minor signs or symptoms of disease. 80   - Normal activity with effort; some signs or symptoms of disease. 80   - Cares for self; unable to carry on normal activity or to do active work. 60   - Requires occasional assistance, but is able to care for most of his personal needs. 50   - Requires considerable assistance and  frequent medical care. 11   - Disabled; requires special care and assistance. 54   - Severely disabled; hospital admission is indicated although death not imminent. 28   - Very sick; hospital admission necessary; active supportive treatment necessary. 10   - Moribund; fatal processes progressing rapidly. 0     - Dead  Karnofsky DA, Abelmann [place], Craver LS and Burchenal [facility] [phone]) The use of the nitrogen mustards in the palliative treatment of carcinoma: with particular reference to bronchogenic carcinoma Cancer 1 [id number]  LABORATORY DATA:  Lab Results  Component Value Date   WBC 6.3 07/08/2018   HGB 14.2 07/08/2018   HCT 41.8 07/08/2018   MCV 85.3 07/08/2018   PLT 268.0 07/08/2018   Lab Results  Component Value Date   NA 145 (H) 05/05/2021   K 4.5 05/05/2021   CL 107 (H) 05/05/2021   CO2 24 05/05/2021   Lab Results  Component Value Date   ALT 10 10/02/2019   AST 19 10/02/2019   ALKPHOS 163 (H) 10/02/2019   BILITOT 0.5 10/02/2019     RADIOGRAPHY: CT Abdomen Pelvis W Wo Contrast  Result Date: 05/27/2021 CLINICAL DATA:  Prostate cancer, new diagnosis.  Staging. EXAM: CT ABDOMEN AND PELVIS WITHOUT AND WITH CONTRAST TECHNIQUE: Multidetector CT imaging of the abdomen and pelvis was performed following the standard protocol before and following the bolus administration of intravenous contrast. CONTRAST:  89mL OMNIPAQUE IOHEXOL 350 MG/ML SOLN COMPARISON:  None FINDINGS: Lower chest: No acute abnormality. Hepatobiliary: 7 mm low-density structure in segment 4 is too small to characterize but is stable from chest CT dated 09/25/2019 and likely represents a benign cyst, image 27/6. No suspicious enhancing liver lesions identified. Gallbladder normal. No bile duct dilatation. Pancreas: Unremarkable. No pancreatic ductal dilatation or surrounding inflammatory changes. Spleen: Normal in size without focal abnormality. Adrenals/Urinary Tract: Normal adrenal glands. Bilateral renal vascular  calcifications identified. No kidney stones or hydronephrosis. Kidney cysts are noted bilaterally. The largest right kidney cyst arises from the inferior pole measuring 3 cm, image 55/6. The largest left kidney cyst arises off the upper pole measuring 2.9 cm, image 35/6. Urinary bladder is unremarkable. Stomach/Bowel: Stomach appears normal. The  appendix is visualized and is within normal limits. No bowel wall thickening, inflammation, or distension. Vascular/Lymphatic: Aortic atherosclerosis. No aneurysm. No abdominopelvic adenopathy. Reproductive: Unremarkable by CT. Other: Fat containing left inguinal hernia. No free fluid or fluid collections identified. Musculoskeletal: Schmorl's node deformity is identified along the inferior endplate of L2. Degenerative disc disease noted at L3-4. No aggressive lytic or sclerotic bone lesions identified. IMPRESSION: 1. No acute findings within the abdomen or pelvis. No mass or adenopathy identified to suggest metastatic disease. 2. Bilateral kidney cysts. 3. Fat containing left inguinal hernia. 4. Aortic atherosclerosis. Aortic Atherosclerosis (ICD10-I70.0). Electronically Signed   By: Kerby Moors M.D.   On: 05/27/2021 09:11   NM Bone Scan Whole Body  Result Date: 05/28/2021 CLINICAL DATA:  Prostate cancer staging. Intermittent bilateral shoulder pain for several years. EXAM: NUCLEAR MEDICINE WHOLE BODY BONE SCAN TECHNIQUE: Whole body anterior and posterior images were obtained approximately 3 hours after intravenous injection of radiopharmaceutical. RADIOPHARMACEUTICALS:  18.7 mCi Technetium-26m MDP IV COMPARISON:  None. FINDINGS: Mild uptake is seen in the humeral heads bilaterally and symmetrically. Degenerative changes in the right foot and right hand. No other abnormal bony uptake. Soft tissue uptake is normal. IMPRESSION: 1. Mild uptake in the humeral heads bilaterally is likely degenerative, especially given the symmetry and history. Recommend dedicated images  of the shoulders for better evaluation. 2. No other significant abnormalities identified. No evidence of bony metastatic disease. Electronically Signed   By: Dorise Bullion III M.D   On: 05/28/2021 17:07      IMPRESSION/PLAN: 1. 70 y.o. gentleman with Stage T1c adenocarcinoma of the prostate with Gleason Score of 3+4, and PSA of 20.5. We discussed the patient's workup and outlined the nature of prostate cancer in this setting. The patient's T stage, Gleason's score, and PSA put him into the intermediate-high risk group.  We discussed that while his pathology indicated a favorable intermediate risk adenocarcinoma of the prostate, technically, his PSA would indicate that he is just barely in the high risk category with a PSA greater than 20.  However, he appears to have a small volume of disease and accordingly, he is eligible for a variety of potential treatment options including brachytherapy, 5.5 weeks of external radiation, or prostatectomy. We discussed the available radiation techniques, and focused on the details and logistics of delivery. We discussed and outlined the risks, benefits, short and long-term effects associated with radiotherapy and compared and contrasted these with prostatectomy. We discussed the role of SpaceOAR gel in reducing the rectal toxicity associated with radiotherapy. He appears to have a good understanding of his disease and our treatment recommendations which are of curative intent.  He was encouraged to ask questions that were answered to his stated satisfaction.  At the conclusion of our conversation, the patient is leaning towards moving forward with 5.5 weeks EBRT. However, he would like to keep his scheduled consult with Dr. Tresa Moore on 06/26/21 to discuss his surgical options before making a final decision.  We will share our discussion with Dr. Alyson Ingles and Dr. Tresa Moore.  He has our contact information and will let us know if he ultimately elects to proceed with radiotherapy  and we will proceed with treatment planning accordingly at that time.  We enjoyed meeting with him and look forward to participating in his care.   We personally spent 75 minutes in this encounter including chart review, reviewing radiological studies, meeting face-to-face with the patient, entering orders and completing documentation.   Nicholos Johns, PA-C  Tyler Pita, MD  [facility]  Radiation Oncology Direct Dial: [phone]  Fax: [phone] [email or web address]  Skype  LinkedIn   This document serves as a record of services personally performed by Tyler Pita, MD and Freeman Caldron, PA-C. It was created on their behalf by Wilburn Mylar, a trained medical scribe. The creation of this record is based on the scribe's personal observations and the provider's statements to them. This document has been checked and approved by the attending provider.

## 2021-05-30 ENCOUNTER — Encounter: Payer: Self-pay | Admitting: Radiation Oncology

## 2021-05-30 ENCOUNTER — Ambulatory Visit
Admission: RE | Admit: 2021-05-30 | Discharge: 2021-05-30 | Disposition: A | Discharge status: Home / Self Care | Payer: Medicare HMO | Source: Ambulatory Visit | Attending: Radiation Oncology | Admitting: Radiation Oncology

## 2021-05-30 ENCOUNTER — Other Ambulatory Visit: Payer: Self-pay

## 2021-05-30 VITALS — BP 146/62 | HR 72 | Temp 97.8°F | Resp 20 | Ht 73.0 in | Wt 198.6 lb

## 2021-05-30 DIAGNOSIS — I10 Essential (primary) hypertension: Secondary | ICD-10-CM | POA: Diagnosis not present

## 2021-05-30 DIAGNOSIS — C61 Malignant neoplasm of prostate: Secondary | ICD-10-CM

## 2021-05-30 DIAGNOSIS — N281 Cyst of kidney, acquired: Secondary | ICD-10-CM | POA: Diagnosis not present

## 2021-05-30 DIAGNOSIS — M5136 Other intervertebral disc degeneration, lumbar region: Secondary | ICD-10-CM | POA: Insufficient documentation

## 2021-05-30 DIAGNOSIS — F17210 Nicotine dependence, cigarettes, uncomplicated: Secondary | ICD-10-CM | POA: Insufficient documentation

## 2021-05-30 DIAGNOSIS — K4090 Unilateral inguinal hernia, without obstruction or gangrene, not specified as recurrent: Secondary | ICD-10-CM | POA: Insufficient documentation

## 2021-05-30 DIAGNOSIS — I6523 Occlusion and stenosis of bilateral carotid arteries: Secondary | ICD-10-CM | POA: Insufficient documentation

## 2021-05-30 DIAGNOSIS — J45909 Unspecified asthma, uncomplicated: Secondary | ICD-10-CM | POA: Insufficient documentation

## 2021-05-30 DIAGNOSIS — Z7951 Long term (current) use of inhaled steroids: Secondary | ICD-10-CM | POA: Diagnosis not present

## 2021-05-30 DIAGNOSIS — J449 Chronic obstructive pulmonary disease, unspecified: Secondary | ICD-10-CM | POA: Insufficient documentation

## 2021-05-30 DIAGNOSIS — I700 Atherosclerosis of aorta: Secondary | ICD-10-CM | POA: Diagnosis not present

## 2021-05-30 DIAGNOSIS — Z79899 Other long term (current) drug therapy: Secondary | ICD-10-CM | POA: Diagnosis not present

## 2021-05-31 ENCOUNTER — Other Ambulatory Visit: Payer: Self-pay | Admitting: Emergency Medicine

## 2021-06-09 ENCOUNTER — Encounter: Payer: Self-pay | Admitting: Urology

## 2021-06-09 ENCOUNTER — Other Ambulatory Visit: Payer: Self-pay

## 2021-06-09 ENCOUNTER — Telehealth: Payer: Self-pay

## 2021-06-09 ENCOUNTER — Ambulatory Visit (INDEPENDENT_AMBULATORY_CARE_PROVIDER_SITE_OTHER): Payer: Medicare HMO | Admitting: Urology

## 2021-06-09 VITALS — BP 96/57 | HR 72

## 2021-06-09 DIAGNOSIS — C61 Malignant neoplasm of prostate: Secondary | ICD-10-CM | POA: Diagnosis not present

## 2021-06-09 LAB — MICROSCOPIC EXAMINATION
Bacteria, UA: NONE SEEN
Epithelial Cells (non renal): NONE SEEN /hpf (ref 0–10)
RBC, Urine: NONE SEEN /hpf (ref 0–2)
Renal Epithel, UA: NONE SEEN /hpf
WBC, UA: NONE SEEN /hpf (ref 0–5)

## 2021-06-09 LAB — URINALYSIS, ROUTINE W REFLEX MICROSCOPIC
Bilirubin, UA: NEGATIVE
Glucose, UA: NEGATIVE
Leukocytes,UA: NEGATIVE
Nitrite, UA: NEGATIVE
RBC, UA: NEGATIVE
Specific Gravity, UA: >1.03 — ABNORMAL HIGH (ref 1.005–1.030)
Urobilinogen, Ur: 0.2 mg/dL (ref 0.2–1.0)
pH, UA: 5.5 (ref 5.0–7.5)

## 2021-06-09 NOTE — Patient Instructions (Signed)
Prostate Cancer  The prostate is a male gland that helps make semen. It is located below a man's bladder, in front of the rectum. Prostate cancer is when abnormal cells grow inthis gland. What are the causes? The cause of this condition is not known. What increases the risk? You are more likely to develop this condition if: You are 70 years of age or older. You are African American. You have a family history of prostate cancer. You have a family history of breast cancer. What are the signs or symptoms? Symptoms of this condition include: A need to pee often. Peeing that is weak, or pee that stops and starts. Trouble starting or stopping your pee. Inability to pee. Blood in your pee or semen. Pain in the lower back, lower belly (abdomen), hips, or upper thighs. Trouble getting an erection. Trouble emptying all of your pee. How is this treated? Treatment for this condition depends on your age, your health, the kind of treatment you like, and how far the cancer has spread. Treatments include: Being watched. This is called observation. You will be tested from time to time, but you will not get treated. Tests are to make sure that the cancer is not growing. Surgery. This may be done to remove the prostate, to remove the testicles, or to freeze or kill cancer cells. Radiation. This uses a strong beam to kill cancer cells. Ultrasound energy. This uses strong sound waves to kill cancer cells. Chemotherapy. This uses medicines that stop cancer cells from increasing. This kills cancer cells and healthy cells. Targeted therapy. This kills cancer cells only. Healthy cells are not affected. Hormone treatment. This stops the body from making hormones that help the cancer cells to grow. Follow these instructions at home: Take over-the-counter and prescription medicines only as told by your doctor. Eat a healthy diet. Get plenty of sleep. Ask your doctor for help to find a support group for men  with prostate cancer. If you have to go to the hospital, let your cancer doctor (oncologist) know. Treatment may affect your ability to have sex. Touch, hold, hug, and caress your partner to have intimate moments. Keep all follow-up visits as told by your doctor. This is important. Contact a doctor if: You have new or more trouble peeing. You have new or more blood in your pee. You have new or more pain in your hips, back, or chest. Get help right away if: You have weakness in your legs. You lose feeling in your legs. You cannot control your pee or your poop (stool). You have chills or a fever. Summary The prostate is a male gland that helps make semen. Prostate cancer is when abnormal cells grow in this gland. Treatment includes doing surgery, using medicines, using very strong beams, or watching without treatment. Ask your doctor for help to find a support group for men with prostate cancer. Contact a doctor if you have problems peeing or have any new pain that you did not have before. This information is not intended to replace advice given to you by your health care provider. Make sure you discuss any questions you have with your healthcare provider. Document Revised: 12/01/2020 Document Reviewed: 10/13/2019 Elsevier Patient Education  2022 Elsevier Inc.  

## 2021-06-09 NOTE — Progress Notes (Signed)
Urological Symptom Review  Patient is experiencing the following symptoms: Get up at night to urinate Stream starts and stops Blood in urine   Review of Systems  Gastrointestinal (upper)  : Negative for upper GI symptoms  Gastrointestinal (lower) : Negative for lower GI symptoms  Constitutional : Negative for symptoms  Skin: Negative for skin symptoms  Eyes: Negative for eye symptoms  Ear/Nose/Throat : Negative for Ear/Nose/Throat symptoms  Hematologic/Lymphatic: Negative for Hematologic/Lymphatic symptoms  Cardiovascular : Negative for cardiovascular symptoms  Respiratory : Shortness of breath  Endocrine: Negative for endocrine symptoms  Musculoskeletal: Back pain  Neurological: Negative for neurological symptoms  Psychologic: Negative for psychiatric symptoms

## 2021-06-09 NOTE — Telephone Encounter (Signed)
Patient wanting to know if he will need the upcoming appts (8-19 labs and 8-26 -McKenzie) since he was seen today, 06-09-2021?  Please advise.  Thanks, Helene Kelp

## 2021-06-09 NOTE — Progress Notes (Signed)
06/09/2021 12:25 PM   [name] [birth year] [id number]  Referring provider: Horald Pollen, MD [place],   [id number]  Followup prostate cancer   HPI: Andrew [name] is a 70yo here for followup for prostate cancer. He met with Dr. Tammi Klippel and likely will proceed with IMRT. He has a colon polyp he is having removed.  He has not met with Dr. Tresa Moore yet. No other complaints today   PMH: Past Medical History:  Diagnosis Date   Asthma    Carotid artery stenosis, asymptomatic, bilateral 12/06/2017   Consider recheck Dopplers in 6 months -> 05/2018 Mild to moderate bilateral carotid artery stenosis Carotid Dopplers done at [facility] cardiovascular 11/26/2017 show left internal carotid artery stenosis 50-69% with soft plaque.  Mild stenosis in the left common carotid artery <50%. Mild stenosis in the right common carotid artery <50% with moderate diffuse soft plaque.  Mild stenosis in the right e   COCAINE ABUSE 01/30/2008   Qualifier: Diagnosis of  By: Amil Amen MD, Benjamine Mola     COPD (chronic obstructive pulmonary disease) ([place])    Hypertension     Surgical History: Past Surgical History:  Procedure Laterality Date   NO PAST SURGERIES      Home Medications:  Allergies as of 06/09/2021       Reactions   Lisinopril Cough   Lisinopril-hydrochlorothiazide    dizziness        Medication List        Accurate as of June 09, 2021 12:25 PM. If you have any questions, ask your nurse or doctor.          albuterol 108 (90 Base) MCG/ACT inhaler Commonly known as: VENTOLIN HFA Inhale 2 puffs into the lungs every 4 (four) hours as needed for wheezing or shortness of breath (cough, shortness of breath or wheezing.).   albuterol (2.5 MG/3ML) 0.083% nebulizer solution Commonly known as: PROVENTIL Take 3 mLs (2.5 mg total) by nebulization every 6 (six) hours as needed for wheezing or shortness of breath.   alfuzosin 10 MG 24 hr tablet Commonly known as:  UROXATRAL Take 1 tablet (10 mg total) by mouth daily with breakfast.   Breztri Aerosphere 160-9-4.8 MCG/ACT Aero Generic drug: Budeson-Glycopyrrol-Formoterol Inhale 2 puffs into the lungs in the morning and at bedtime.   budesonide-formoterol 160-4.5 MCG/ACT inhaler Commonly known as: SYMBICORT Inhale 2 puffs into the lungs 2 (two) times daily.   cetirizine 10 MG tablet Commonly known as: ZYRTEC TAKE 1 TABLET (10 MG TOTAL) BY MOUTH DAILY.   diltiazem 240 MG 24 hr capsule Commonly known as: DILACOR XR TAKE 1 CAPSULE EVERY DAY   fluticasone 50 MCG/ACT nasal spray Commonly known as: FLONASE Place 1 spray into both nostrils daily.   guaiFENesin 600 MG 12 hr tablet Commonly known as: Mucinex Take 1 tablet (600 mg total) by mouth 2 (two) times daily as needed for cough or to loosen phlegm.   olmesartan 40 MG tablet Commonly known as: BENICAR Take 1 tablet (40 mg total) by mouth daily.   rosuvastatin 10 MG tablet Commonly known as: Crestor Take 1 tablet (10 mg total) by mouth daily.   tadalafil 20 MG tablet Commonly known as: CIALIS Take 1 tablet (20 mg total) by mouth daily as needed for erectile dysfunction.   triamcinolone cream 0.1 % Commonly known as: KENALOG APPLY 1 APPLICATION TOPICALLY 2 (TWO) TIMES DAILY.        Allergies:  Allergies  Allergen Reactions   Lisinopril Cough  Lisinopril-Hydrochlorothiazide     dizziness    Family History: Family History  Problem Relation Age of Onset   Colon cancer Neg Hx    Esophageal cancer Neg Hx    Rectal cancer Neg Hx    Stomach cancer Neg Hx    Colon polyps Neg Hx     Social History:  reports that he has been smoking cigarettes. He has a 11.00 pack-year smoking history. He has never used smokeless tobacco. He reports current alcohol use of about 2.0 standard drinks of alcohol per week. He reports that he does not use drugs.  ROS: All other review of systems were reviewed and are negative except what is noted  above in HPI  Physical Exam: BP (!) 96/57   Pulse 72   Constitutional:  Alert and oriented, No acute distress. HEENT: [place] AT, moist mucus membranes.  Trachea midline, no masses. Cardiovascular: No clubbing, cyanosis, or edema. Respiratory: Normal respiratory effort, no increased work of breathing. GI: Abdomen is soft, nontender, nondistended, no abdominal masses GU: No CVA tenderness.  Lymph: No cervical or inguinal lymphadenopathy. Skin: No rashes, bruises or suspicious lesions. Neurologic: Grossly intact, no focal deficits, moving all 4 extremities. Psychiatric: Normal mood and affect.  Laboratory Data: Lab Results  Component Value Date   WBC 6.3 07/08/2018   HGB 14.2 07/08/2018   HCT 41.8 07/08/2018   MCV 85.3 07/08/2018   PLT 268.0 07/08/2018    Lab Results  Component Value Date   CREATININE 1.81 (H) 05/05/2021    Lab Results  Component Value Date   PSA 5.4 (H) 09/14/2016   PSA 5.58 (H) 01/24/2016    No results found for: TESTOSTERONE  Lab Results  Component Value Date   HGBA1C 5.6 10/02/2019    Urinalysis    Component Value Date/Time   APPEARANCEUR Clear 01/09/2021 1551   GLUCOSEU Negative 01/09/2021 1551   BILIRUBINUR Negative 01/09/2021 1551   KETONESUR negative 10/17/2017 1615   PROTEINUR Negative 01/09/2021 1551   UROBILINOGEN 0.2 10/17/2017 1615   NITRITE Negative 01/09/2021 1551   LEUKOCYTESUR Negative 01/09/2021 1551    Lab Results  Component Value Date   LABMICR Comment 01/09/2021   WBCUA None seen 07/27/2020   LABEPIT None seen 07/27/2020   BACTERIA None seen 07/27/2020    Pertinent Imaging:  No results found for this or any previous visit.  No results found for this or any previous visit.  No results found for this or any previous visit.  No results found for this or any previous visit.  Results for orders placed during the hospital encounter of 05/27/20  US RENAL  Narrative CLINICAL DATA:  CKD stage 3  EXAM: RENAL /  URINARY TRACT ULTRASOUND COMPLETE  COMPARISON:  Chest CT acquired on September 25, 2019, limited assessment of the kidneys at that time.  FINDINGS: Right Kidney:  Renal measurements: 10.7 x 4.0 x 4.5 cm = volume: 102 mL. Small hypoechoic to anechoic focus in the upper pole of the RIGHT kidney shows some central increased echogenicity and perhaps multi septate changes.  3.9 x 3.3 x 2.7 cm anechoic cyst with adjacent smaller cystic area 1.2 x 1.3 x 1.3 cm cannot be classified as a simple cyst and does not show overt signs of flow. No hydronephrosis. Decreased corticomedullary differentiation.  Left Kidney:  Renal measurements: 10.6 x 5.7 x 4.8 cm = volume: 151 mL. Decreased corticomedullary differentiation without hydronephrosis. Small mildly complex areas with multi septate or spongiform appearance in the upper pole and  interpolar portion largest measuring 1.6 x 1.4 x 1.5 cm with smaller areas, less than 10 also showing some cystic characteristics.  Bladder:  Appears normal for degree of bladder distention.  Other:  None.  IMPRESSION: 1. Decreased corticomedullary differentiation without hydronephrosis. Findings can be seen in the setting of medical renal disease. 2. Multiple cystic lesions in the bilateral kidneys without renal enlargement. Some of these areas display internal complexity/variable echogenicity. MRI may be helpful for further evaluation to exclude the presence of enhancing components/small cystic renal neoplasm.  These results will be called to the ordering clinician or representative by the Radiologist Assistant, and communication documented in the PACS or [employer].   Electronically Signed By: Zetta Bills M.D. On: 05/30/2020 10:55  No results found for this or any previous visit.  No results found for this or any previous visit.  No results found for this or any previous visit.   Assessment & Plan:    1. Prostate cancer [facility]) I  discussed the natural history of intermediate risk prostate cancer with the patient and the various treatment options including active surveillance, RALP, IMRT, brachytherapy, cryotherapy, HIFU and ADT. After discussing the options the patient elects for IMRT. He will call after he has his colonoscopy and polyp removal - Urinalysis, Routine w reflex microscopic   No follow-ups on file.  Nicolette Bang, MD  [facility] [place]

## 2021-06-10 LAB — PSA: Prostate Specific Ag, Serum: 20.4 ng/mL — ABNORMAL HIGH (ref 0.0–4.0)

## 2021-06-11 DIAGNOSIS — Z20822 Contact with and (suspected) exposure to covid-19: Secondary | ICD-10-CM | POA: Diagnosis not present

## 2021-06-12 NOTE — Telephone Encounter (Signed)
Mychart message sent.

## 2021-06-14 ENCOUNTER — Other Ambulatory Visit: Payer: Self-pay | Admitting: Emergency Medicine

## 2021-06-14 DIAGNOSIS — J301 Allergic rhinitis due to pollen: Secondary | ICD-10-CM

## 2021-06-14 NOTE — Progress Notes (Signed)
Results sent via my chart 

## 2021-06-23 ENCOUNTER — Ambulatory Visit (INDEPENDENT_AMBULATORY_CARE_PROVIDER_SITE_OTHER): Payer: Medicare HMO | Admitting: Cardiology

## 2021-06-23 ENCOUNTER — Encounter: Payer: Self-pay | Admitting: Cardiology

## 2021-06-23 ENCOUNTER — Other Ambulatory Visit: Payer: Self-pay

## 2021-06-23 VITALS — BP 124/48 | HR 68 | Ht 73.0 in | Wt 197.0 lb

## 2021-06-23 DIAGNOSIS — I2510 Atherosclerotic heart disease of native coronary artery without angina pectoris: Secondary | ICD-10-CM | POA: Diagnosis not present

## 2021-06-23 DIAGNOSIS — E785 Hyperlipidemia, unspecified: Secondary | ICD-10-CM

## 2021-06-23 DIAGNOSIS — R7303 Prediabetes: Secondary | ICD-10-CM | POA: Diagnosis not present

## 2021-06-23 DIAGNOSIS — I779 Disorder of arteries and arterioles, unspecified: Secondary | ICD-10-CM

## 2021-06-23 DIAGNOSIS — Z01810 Encounter for preprocedural cardiovascular examination: Secondary | ICD-10-CM | POA: Insufficient documentation

## 2021-06-23 DIAGNOSIS — I6523 Occlusion and stenosis of bilateral carotid arteries: Secondary | ICD-10-CM | POA: Diagnosis not present

## 2021-06-23 NOTE — Progress Notes (Signed)
Cardiology Consultation:    Date:  06/23/2021   ID:  Andrew [name], DOB 05-31-1951, MRN [id number]  PCP:  Andrew Pollen, MD  Cardiologist:  Andrew Campus, MD   Referring MD: Andrew [name], *   Chief Complaint  Patient presents with   Clearance TBD    Polyp Removal from hemorrhoid. Dr. Owens Loffler     History of Present Illness:    Andrew [name] is a 70 y.o. male who is being seen today for the evaluation of cardiovascular preop evaluation at the request of Andrew [name], *.  He does have multiple risk factors for coronary artery disease.  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  He still continues to smoke, he is hypertensive, he does have dyslipidemia.  He was given Crestor however he did not take that medication because he said it make him feel dizzy.  He was sent to [place] because look like he required some surgery to remove polyps from his hemorrhoids.  He also being diagnosed with prostate cancer he will require to radiation for it.  He denies have any chest pain tightness squeezing pressure burning chest but does have some significant shortness of breath with exertion.  He said when he walk upstairs especially carrying something heavy he will get short of breath has sometimes have to stop while walking.  His wife who is with him in the room said that she can see it quite easily and is getting slightly worse.  He denies have any typical tightness squeezing pressure burning chest.  There is no TIA CVA-like symptoms.  There is no palpitations. He still continues to smoke few cigarettes a day Does not have family history of premature coronary artery disease  Past Medical History:  Diagnosis Date   Asthma    Carotid artery stenosis, asymptomatic, bilateral 12/06/2017   Consider recheck Dopplers in 6 months -> 05/2018 Mild to moderate bilateral carotid artery stenosis Carotid  Dopplers done at [facility] cardiovascular 11/26/2017 show left internal carotid artery stenosis 50-69% with soft plaque.  Mild stenosis in the left common carotid artery <50%. Mild stenosis in the [name] common carotid artery <50% with moderate diffuse soft plaque.  Mild stenosis in the [name] e   COCAINE ABUSE 01/30/2008   Qualifier: Diagnosis of  By: Amil Amen MD, Benjamine Mola     COPD (chronic obstructive pulmonary disease) ([place])    Hypertension     Past Surgical History:  Procedure Laterality Date   NO PAST SURGERIES      Current Medications: Current Meds  Medication Sig   albuterol (PROVENTIL HFA;VENTOLIN HFA) 108 (90 Base) MCG/ACT inhaler Inhale 2 puffs into the lungs every 4 (four) hours as needed for wheezing or shortness of breath (cough, shortness of breath or wheezing.).   albuterol (PROVENTIL) (2.5 MG/3ML) 0.083% nebulizer solution Take 3 mLs (2.5 mg total) by nebulization every 6 (six) hours as needed for wheezing or shortness of breath.   alfuzosin (UROXATRAL) 10 MG 24 hr tablet Take 1 tablet (10 mg total) by mouth daily with breakfast.   Budeson-Glycopyrrol-Formoterol (BREZTRI AEROSPHERE) 160-9-4.8 MCG/ACT AERO Inhale 2 puffs into the lungs in the morning and at bedtime.   cetirizine (ZYRTEC) 10 MG tablet TAKE 1 TABLET EVERY DAY (Patient taking differently: Take 10 mg by mouth daily. TAKE 1 TABLET EVERY DAY)   diltiazem (DILACOR XR) 240 MG 24 hr capsule TAKE 1 CAPSULE EVERY DAY (  Patient taking differently: Take 240 mg by mouth daily.)   fluticasone (FLONASE) 50 MCG/ACT nasal spray Place 1 spray into both nostrils daily.   guaiFENesin (MUCINEX) 600 MG 12 hr tablet Take 1 tablet (600 mg total) by mouth 2 (two) times daily as needed for cough or to loosen phlegm.   olmesartan (BENICAR) 40 MG tablet Take 1 tablet (40 mg total) by mouth daily.   rosuvastatin (CRESTOR) 10 MG tablet Take 1 tablet (10 mg total) by mouth daily.   tadalafil (CIALIS) 20 MG tablet Take 1 tablet (20 mg total) by  mouth daily as needed for erectile dysfunction.   triamcinolone (KENALOG) 0.1 % APPLY 1 APPLICATION TOPICALLY 2 (TWO) TIMES DAILY.     Allergies:   Lisinopril and Lisinopril-hydrochlorothiazide   Social History   Socioeconomic History   Marital status: Married    Spouse name: Not on file   Number of children: 1   Years of education: Not on file   Highest education level: Not on file  Occupational History   Not on file  Tobacco Use   Smoking status: Some Days    Packs/day: 0.25    Years: 44.00    Pack years: 11.00    Types: Cigarettes   Smokeless tobacco: Never  Vaping Use   Vaping Use: Never used  Substance and Sexual Activity   Alcohol use: Yes    Alcohol/week: 2.0 standard drinks    Types: 1 Cans of beer, 1 Shots of liquor per week    Comment: occasional   Drug use: No   Sexual activity: Not on file  Other Topics Concern   Not on file  Social History Narrative   Not on file   Social Determinants of Health   Financial Resource Strain: Not on file  Food Insecurity: Not on file  Transportation Needs: Not on file  Physical Activity: Not on file  Stress: Not on file  Social Connections: Not on file     Family History: The patient's family history is negative for Colon cancer, Esophageal cancer, Rectal cancer, Stomach cancer, and Colon polyps. ROS:   Please see the history of present illness.    All 14 point review of systems negative except as described per history of present illness.  EKGs/Labs/Other Studies Reviewed:    The following studies were reviewed today:   EKG:  EKG is  ordered today.  The ekg ordered today demonstrates normal sinus rhythm, left ventricular hypertrophy, nonspecific ST segment changes  Recent Labs: 05/05/2021: BUN 20; Creatinine, Ser 1.81; Potassium 4.5; Sodium 145  Recent Lipid Panel    Component Value Date/Time   CHOL 263 (H) 10/02/2019 1115   TRIG 127 10/02/2019 1115   HDL 60 10/02/2019 1115   CHOLHDL 4.4 10/02/2019 1115    CHOLHDL 4.3 09/14/2016 1306   VLDL 15 09/14/2016 1306   LDLCALC 180 (H) 10/02/2019 1115    Physical Exam:    VS:  BP (!) 124/48 (BP Location: [name] Arm, Patient Position: Sitting)   Pulse 68   Ht '6\' 1"'$  (1.854 m)   Wt 197 lb (89.4 kg)   SpO2 97%   BMI 25.99 kg/m     Wt Readings from Last 3 Encounters:  06/23/21 197 lb (89.4 kg)  05/30/21 198 lb 9.6 oz (90.1 kg)  05/24/21 195 lb (88.5 kg)     GEN:  Well nourished, well developed in no acute distress HEENT: Normal NECK: No JVD; No carotid bruits LYMPHATICS: No lymphadenopathy CARDIAC: RRR, no murmurs, no  rubs, no gallops RESPIRATORY:  Clear to auscultation without rales, wheezing or rhonchi  ABDOMEN: Soft, non-tender, non-distended MUSCULOSKELETAL:  No edema; No deformity  SKIN: Warm and dry NEUROLOGIC:  Alert and oriented x 3 PSYCHIATRIC:  Normal affect   ASSESSMENT:    1. Hyperlipidemia LDL goal <70   2. Preop cardiovascular exam   3. Carotid artery disease, unspecified laterality, unspecified type ([place])   4. Atherosclerosis of native coronary artery of native heart without angina pectoris   5. Carotid artery stenosis, asymptomatic, bilateral   6. Prediabetes    PLAN:    In order of problems listed above:  Cardiovascular preop evaluation for this gentleman with multiple risk factors for coronary artery disease.  Overall I think the prudent approach will be to evaluate him for presence of coronary artery disease.  Therefore, I think he need to have a stress test.  We will schedule him to have it.  Also echocardiogram need to be done to assess left ventricular ejection fraction I do this because of dyspnea on exertion which is probably at least partially related to COPD but I will make sure there is no cardiac component of it.  He is taking with a baby aspirin and I will continue. Dyslipidemia we will check his direct LDL today and decide about treatment he is was given Crestor however have no tolerance to this medication  therefore would use different medication probably Lipitor. Carotic arterial disease: He will be scheduled to have carotic ultrasound. Prediabetes obviously concerning.  Exercise on the regular basis was good diet is noted for now.   Medication Adjustments/Labs and Tests Ordered: Current medicines are reviewed at length with the patient today.  Concerns regarding medicines are outlined above.  Orders Placed This Encounter  Procedures   Direct LDL   MYOCARDIAL PERFUSION IMAGING   ECHOCARDIOGRAM COMPLETE   VAS US CAROTID   No orders of the defined types were placed in this encounter.   Signed, Park Liter, MD, [facility]. 06/23/2021 4:40 PM    [facility]

## 2021-06-23 NOTE — Patient Instructions (Signed)
Medication Instructions:  Your physician recommends that you continue on your current medications as directed. Please refer to the Current Medication list given to you today.  *If you need a refill on your cardiac medications before your next appointment, please call your pharmacy*   Lab Work: Your physician recommends that you return for lab work in:  TODAY: Direct lipids If you have labs (blood work) drawn today and your tests are completely normal, you will receive your results only by: [place] (if you have [place]) OR A paper copy in the mail If you have any lab test that is abnormal or we need to change your treatment, we will call you to review the results.   Testing/Procedures: Your physician has requested that you have an echocardiogram. Echocardiography is a painless test that uses sound waves to create images of your heart. It provides your doctor with information about the size and shape of your heart and how well your heart's chambers and valves are working. This procedure takes approximately one hour. There are no restrictions for this procedure.  Your physician has requested that you have a carotid duplex. This test is an ultrasound of the carotid arteries in your neck. It looks at blood flow through these arteries that supply the brain with blood. Allow one hour for this exam. There are no restrictions or special instructions.  Your physician has requested that you have a lexiscan myoview. For further information please visit [email or web address]. Please follow instruction sheet, as given.   The test will take approximately 3 to 4 hours to complete; you may bring reading material.  If someone comes with you to your appointment, they will need to remain in the main lobby due to limited space in the testing area. **If you are pregnant or breastfeeding, please notify the nuclear lab prior to your appointment**  How to prepare for your Myocardial Perfusion Test: Do not eat  or drink 3 hours prior to your test, except you may have water. Do not consume products containing caffeine (regular or decaffeinated) 12 hours prior to your test. (ex: coffee, chocolate, sodas, tea). Do bring a list of your current medications with you.  If not listed below, you may take your medications as normal. Do wear comfortable clothes (no dresses or overalls) and walking shoes, tennis shoes preferred (No heels or open toe shoes are allowed). Do NOT wear cologne, perfume, aftershave, or lotions (deodorant is allowed). If these instructions are not followed, your test will have to be rescheduled.    Follow-Up: At [facility], you and your health needs are our priority.  As part of our continuing mission to provide you with exceptional heart care, we have created designated Provider Care Teams.  These Care Teams include your primary Cardiologist (physician) and Advanced Practice Providers (APPs -  Physician Assistants and Nurse Practitioners) who all work together to provide you with the care you need, when you need it.  We recommend signing up for the patient portal called "MyChart".  Sign up information is provided on this After Visit Summary.  MyChart is used to connect with patients for Virtual Visits (Telemedicine).  Patients are able to view lab/test results, encounter notes, upcoming appointments, etc.  Non-urgent messages can be sent to your provider as well.   To learn more about what you can do with MyChart, go to [email or web address].    Your next appointment:   3 month(s)  The format for your next appointment:   In Person  Provider:  Jenne Campus, MD   Other Instructions Echocardiogram An echocardiogram is a test that uses sound waves (ultrasound) to produce images of the heart. Images from an echocardiogram can provide important information about: Heart size and shape. The size and thickness and movement of your heart's walls. Heart muscle function and  strength. Heart valve function or if you have stenosis. Stenosis is when the heart valves are too narrow. If blood is flowing backward through the heart valves (regurgitation). A tumor or infectious growth around the heart valves. Areas of heart muscle that are not working well because of poor blood flow or injury from a heart attack. Aneurysm detection. An aneurysm is a weak or damaged part of an artery wall. The wall bulges out from the normal force of blood pumping through the body. Tell a health care provider about: Any allergies you have. All medicines you are taking, including vitamins, herbs, eye drops, creams, and over-the-counter medicines. Any blood disorders you have. Any surgeries you have had. Any medical conditions you have. Whether you are pregnant or may be pregnant. What are the risks? Generally, this is a safe test. However, problems may occur, including an allergic reaction to dye (contrast) that may be used during the test. What happens before the test? No specific preparation is needed. You may eat and drink normally. What happens during the test?  You will take off your clothes from the waist up and put on a hospital gown. Electrodes or electrocardiogram (ECG)patches may be placed on your chest. The electrodes or patches are then connected to a device that monitors your heart rate and rhythm. You will lie down on a table for an ultrasound exam. A gel will be applied to your chest to help sound waves pass through your skin. A handheld device, called a transducer, will be pressed against your chest and moved over your heart. The transducer produces sound waves that travel to your heart and bounce back (or "echo" back) to the transducer. These sound waves will be captured in real-time and changed into images of your heart that can be viewed on a video monitor. The images will be recorded on a computer and reviewed by your health care provider. You may be asked to change  positions or hold your breath for a short time. This makes it easier to get different views or better views of your heart. In some cases, you may receive contrast through an IV in one of your veins. This can improve the quality of the pictures from your heart. The procedure may vary among health care providers and hospitals. What can I expect after the test? You may return to your normal, everyday life, including diet, activities, andmedicines, unless your health care provider tells you not to do that. Follow these instructions at home: It is up to you to get the results of your test. Ask your health care provider, or the department that is doing the test, when your results will be ready. Keep all follow-up visits. This is important. Summary An echocardiogram is a test that uses sound waves (ultrasound) to produce images of the heart. Images from an echocardiogram can provide important information about the size and shape of your heart, heart muscle function, heart valve function, and other possible heart problems. You do not need to do anything to prepare before this test. You may eat and drink normally. After the echocardiogram is completed, you may return to your normal, everyday life, unless your health care provider tells you not  to do that. This information is not intended to replace advice given to you by your health care provider. Make sure you discuss any questions you have with your healthcare provider. Document Revised: 06/21/2020 Document Reviewed: 06/21/2020 Elsevier Patient Education  2022 [employer].

## 2021-06-23 NOTE — Progress Notes (Signed)
dires

## 2021-06-24 LAB — LDL CHOLESTEROL, DIRECT: LDL Direct: 146 mg/dL — ABNORMAL HIGH (ref 0–99)

## 2021-06-26 DIAGNOSIS — K4020 Bilateral inguinal hernia, without obstruction or gangrene, not specified as recurrent: Secondary | ICD-10-CM | POA: Diagnosis not present

## 2021-06-26 DIAGNOSIS — R351 Nocturia: Secondary | ICD-10-CM | POA: Diagnosis not present

## 2021-06-26 DIAGNOSIS — N281 Cyst of kidney, acquired: Secondary | ICD-10-CM | POA: Diagnosis not present

## 2021-06-26 DIAGNOSIS — C61 Malignant neoplasm of prostate: Secondary | ICD-10-CM | POA: Diagnosis not present

## 2021-06-28 ENCOUNTER — Other Ambulatory Visit: Payer: Medicare HMO

## 2021-06-28 ENCOUNTER — Telehealth: Payer: Self-pay

## 2021-06-28 MED ORDER — LISINOPRIL 20 MG PO TABS: 20.0000 mg | ORAL_TABLET | Freq: Every day | ORAL | 3 refills | Status: DC

## 2021-06-28 MED ORDER — ATORVASTATIN CALCIUM 20 MG PO TABS: 20.0000 mg | ORAL_TABLET | Freq: Every day | ORAL | 3 refills | Status: DC

## 2021-06-28 NOTE — Telephone Encounter (Signed)
-----   Message from Park Liter, MD sent at 06/28/2021  3:50 PM EDT ----- Cholesterol very elevated.  I would suggest to start Lipitor 20 mg daily, fasting lipid profile, AST ALT need to be checked 6 weeks later

## 2021-06-28 NOTE — Telephone Encounter (Signed)
Spoke with patient regarding results and recommendation.  Patient verbalizes understanding and is agreeable to plan of care. Advised patient to call back with any issues or concerns.  

## 2021-06-30 ENCOUNTER — Telehealth (HOSPITAL_COMMUNITY): Payer: Self-pay | Admitting: *Deleted

## 2021-06-30 ENCOUNTER — Other Ambulatory Visit: Payer: Medicare HMO

## 2021-06-30 NOTE — Telephone Encounter (Signed)
Patient given detailed instructions per Myocardial Perfusion Study Information Sheet for the test on 07/07/21 at 7:30. Patient notified to arrive 15 minutes early and that it is imperative to arrive on time for appointment to keep from having the test rescheduled.  If you need to cancel or reschedule your appointment, please call the office within 24 hours of your appointment. . Patient verbalized understanding.Andrew [name]

## 2021-07-04 ENCOUNTER — Telehealth (HOSPITAL_COMMUNITY): Payer: Self-pay | Admitting: Radiology

## 2021-07-04 NOTE — Telephone Encounter (Signed)
Patient given detailed instructions per Myocardial Perfusion Study Information Sheet for the test on 07/13/2021 at 10:45. Patient notified to arrive 15 minutes early and that it is imperative to arrive on time for appointment to keep from having the test rescheduled.  If you need to cancel or reschedule your appointment, please call the office within 24 hours of your appointment. . Patient verbalized understanding.EHK

## 2021-07-05 ENCOUNTER — Ambulatory Visit: Payer: Medicare HMO | Admitting: Urology

## 2021-07-07 ENCOUNTER — Encounter (HOSPITAL_COMMUNITY): Payer: Medicare HMO

## 2021-07-07 ENCOUNTER — Ambulatory Visit: Payer: Medicare HMO | Admitting: Urology

## 2021-07-11 ENCOUNTER — Telehealth (HOSPITAL_COMMUNITY): Payer: Self-pay

## 2021-07-11 DIAGNOSIS — H524 Presbyopia: Secondary | ICD-10-CM | POA: Diagnosis not present

## 2021-07-11 DIAGNOSIS — H52223 Regular astigmatism, bilateral: Secondary | ICD-10-CM | POA: Diagnosis not present

## 2021-07-11 NOTE — Telephone Encounter (Signed)
Detailed instructions left on the patient's answering machine. Asked to call back with any questions. S.Dardan Shelton EMTP 

## 2021-07-13 ENCOUNTER — Other Ambulatory Visit: Payer: Self-pay

## 2021-07-13 ENCOUNTER — Ambulatory Visit (HOSPITAL_COMMUNITY): Payer: Medicare HMO | Attending: Cardiology

## 2021-07-13 ENCOUNTER — Ambulatory Visit (HOSPITAL_BASED_OUTPATIENT_CLINIC_OR_DEPARTMENT_OTHER): Payer: Medicare HMO

## 2021-07-13 DIAGNOSIS — Z01810 Encounter for preprocedural cardiovascular examination: Secondary | ICD-10-CM | POA: Diagnosis not present

## 2021-07-13 LAB — MYOCARDIAL PERFUSION IMAGING
LV dias vol: 135 mL (ref 62–150)
LV sys vol: 51 mL
Nuc Stress EF: 62 %
Peak HR: 70 {beats}/min
Rest HR: 52 {beats}/min
Rest Nuclear Isotope Dose: 10.7 mCi
SDS: 1
SRS: 0
SSS: 1
ST Depression (mm): 0 mm
Stress Nuclear Isotope Dose: 32.3 mCi
TID: 0.99

## 2021-07-13 LAB — ECHOCARDIOGRAM COMPLETE
Area-P 1/2: 2.87 cm2
Height: 73 in
P 1/2 time: 482 msec
S' Lateral: 3.2 cm
Weight: 3152 oz

## 2021-07-13 MED ORDER — TECHNETIUM TC 99M TETROFOSMIN IV KIT
10.7000 | PACK | Freq: Once | INTRAVENOUS | Status: AC | PRN
  Administered 2021-07-13: 10.7 via INTRAVENOUS
  Filled 2021-07-13: qty 11

## 2021-07-13 MED ORDER — TECHNETIUM TC 99M TETROFOSMIN IV KIT
32.3000 | PACK | Freq: Once | INTRAVENOUS | Status: AC | PRN
  Administered 2021-07-13: 32.3 via INTRAVENOUS
  Filled 2021-07-13: qty 33

## 2021-07-13 MED ORDER — REGADENOSON 0.4 MG/5ML IV SOLN
0.4000 mg | Freq: Once | INTRAVENOUS | Status: AC
  Administered 2021-07-13: 0.4 mg via INTRAVENOUS

## 2021-07-19 ENCOUNTER — Telehealth: Payer: Self-pay

## 2021-07-19 ENCOUNTER — Other Ambulatory Visit: Payer: Self-pay

## 2021-07-19 ENCOUNTER — Ambulatory Visit (HOSPITAL_BASED_OUTPATIENT_CLINIC_OR_DEPARTMENT_OTHER)
Admission: RE | Admit: 2021-07-19 | Discharge: 2021-07-19 | Disposition: A | Discharge status: Home / Self Care | Payer: Medicare HMO | Source: Ambulatory Visit | Attending: Cardiology | Admitting: Cardiology

## 2021-07-19 DIAGNOSIS — I779 Disorder of arteries and arterioles, unspecified: Secondary | ICD-10-CM | POA: Insufficient documentation

## 2021-07-19 DIAGNOSIS — I6523 Occlusion and stenosis of bilateral carotid arteries: Secondary | ICD-10-CM

## 2021-07-19 NOTE — Telephone Encounter (Signed)
-----   Message from Park Liter, MD sent at 07/19/2021 12:34 PM EDT ----- Carotic ultrasound showed up to 60 to 70% in the left internal carotic artery.  Medical therapy

## 2021-07-19 NOTE — Telephone Encounter (Signed)
Spoke with patient regarding results and recommendation.  Patient verbalizes understanding and is agreeable to plan of care. Advised patient to call back with any issues or concerns.  

## 2021-07-20 ENCOUNTER — Telehealth: Payer: Self-pay

## 2021-07-20 NOTE — Telephone Encounter (Signed)
Spoke with patient regarding results and recommendation.  Patient verbalizes understanding and is agreeable to plan of care. Advised patient to call back with any issues or concerns.  

## 2021-07-21 ENCOUNTER — Telehealth: Payer: Self-pay

## 2021-07-21 NOTE — Telephone Encounter (Signed)
Please Advise  Patient called and states that he needs a referral to get a polyp removed. Pt had a colonoscopy and polyps were found, one was removed. Pt was advised to get clearance from a cardiologist to insure pt could accept the anesthesia Pt states he was cleared by cardio to have procedure done, but they did not refer pt to where to have procedure completed.

## 2021-07-22 NOTE — Telephone Encounter (Signed)
Referral to what service? Thanks

## 2021-07-24 ENCOUNTER — Ambulatory Visit: Payer: Medicare HMO | Admitting: Urology

## 2021-07-24 DIAGNOSIS — C61 Malignant neoplasm of prostate: Secondary | ICD-10-CM

## 2021-07-25 ENCOUNTER — Telehealth: Payer: Self-pay | Admitting: Gastroenterology

## 2021-07-25 NOTE — Telephone Encounter (Signed)
We do not have any heart test results in this office.  He had cardiologist office visit on 06/23/2021 with Park Liter, MD Cardiology.  I am confused with this message.

## 2021-07-25 NOTE — Telephone Encounter (Signed)
Left message on machine to call back  The pt should be able call [facility] and make the appt. The referral was placed in May.

## 2021-07-25 NOTE — Telephone Encounter (Signed)
The pt returned call and will reach out to [facility] for appt date and time.  The pt has been advised of the information and verbalized understanding.

## 2021-07-25 NOTE — Telephone Encounter (Signed)
Inbound call from patient. Had procedure done in May, 2022. States he was to be referral to a surgeon to remove polyp on hemorrhoid but he had first had to see cardio dr to make sure everything is fine. Patient now states he is cleared and PCP Dr. Mitchel Honour would like to send his records. He needs to know the surgeon, location, and also what to send. Best contact number [phone]

## 2021-07-25 NOTE — Telephone Encounter (Signed)
Please advise as pt has stated he needs all his heart test results from 07/13/2021 and 07/19/2021 faxed over to [facility].  The telephone number for [place] is [phone].

## 2021-07-26 NOTE — Telephone Encounter (Signed)
It has to be the cardiologist office faxing all of these documents.

## 2021-07-28 NOTE — Telephone Encounter (Signed)
Spoke with pt and was able to inform him of Dr. Barry Brunner instruction.

## 2021-08-01 ENCOUNTER — Telehealth: Payer: Self-pay | Admitting: Cardiology

## 2021-08-01 NOTE — Telephone Encounter (Signed)
Follow up:     Patient is calling, because he has been waiting since Dr. Agustin Cree has cleared him 07/02/21. Please faxed patient results to [facility]. [phone] fax number

## 2021-08-01 NOTE — Telephone Encounter (Signed)
Last office note and all recent test have been sent via Epic to [facility].

## 2021-08-07 NOTE — Telephone Encounter (Signed)
Dr. Wendy Poet office note has been sent to Dr. Dema Severin at [facility] office via [place].

## 2021-08-07 NOTE — Telephone Encounter (Signed)
Patient calling back. He states the fax has not bee received by [facility] and requests it be sent again.

## 2021-08-08 ENCOUNTER — Telehealth: Payer: Self-pay

## 2021-08-08 NOTE — Telephone Encounter (Signed)
    Patient Name: Andrew [name]  DOB: 01/20/1951 MRN: [id number]  Primary Cardiologist: Dr. Agustin Cree, MD   Chart reviewed as part of pre-operative protocol coverage. Given past medical history and time since last visit, based on ACC/AHA guidelines, Andrew [name] would be at acceptable risk for the planned procedure without further cardiovascular testing. See prior plan below.   The patient was recently seen 06/2021 for pre-operative evaluation with Dr. Agustin Cree. Due to risk factors and his symptoms, he underwent an echocardiogram which showed preserved LVEF. A nuclear stress test was also performed and showed no signs of ischemia.   I will route this recommendation to the requesting party via Epic fax function and remove from pre-op pool.  Please call with questions.  Kathyrn Drown, NP 08/08/2021, 3:04 PM

## 2021-08-08 NOTE — Telephone Encounter (Signed)
[facility] with [facility] is following up. She states their office never received our clearance recommendation. She would like to know if it can be resubmitted to fax #, [phone]. Please assist.

## 2021-08-08 NOTE — Telephone Encounter (Signed)
   [facility] Pre-operative Risk Assessment    Patient Name: Andrew [name]  DOB: Oct 27, 1951 MRN: [id number]  HEARTCARE STAFF:  - IMPORTANT!!!!!! Under Visit Info/Reason for Call, type in Other and utilize the format Clearance MM/DD/YY or Clearance TBD. Do not use dashes or single digits. - Please review there is not already an duplicate clearance open for this procedure. - If request is for dental extraction, please clarify the # of teeth to be extracted. - If the patient is currently at the dentist's office, call Pre-Op Callback Staff (MA/nurse) to input urgent request.  - If the patient is not currently in the dentist office, please route to the Pre-Op pool.  Request for surgical clearance:  What type of surgery is being performed? Transanal excision of anal polyp  When is this surgery scheduled? As soon as he is cleared  What type of clearance is required (medical clearance vs. Pharmacy clearance to hold med vs. Both)? medical  Are there any medications that need to be held prior to surgery and how long? None  Practice name and name of physician performing surgery? [facility], Dr. Theadore Nan  What is the office phone number? [phone]   7.   What is the office fax number? [phone] attn. Kelly  8.   Anesthesia type (None, local, MAC, general) ? general   Truddie Hidden 08/08/2021, 2:38 PM  _________________________________________________________________   (provider comments below)

## 2021-08-08 NOTE — Telephone Encounter (Signed)
Spoke with Claiborne Billings and advised that we had not received an clearance form. Kelly faxed it to the [place] office and it has been completed and sent to perop as urgent.

## 2021-08-08 NOTE — Telephone Encounter (Signed)
Dr. Wendy Poet notes and tests that have been ordered have been resubmitted via fax [phone].

## 2021-08-16 ENCOUNTER — Ambulatory Visit: Payer: Medicare HMO | Admitting: Urology

## 2021-08-18 ENCOUNTER — Ambulatory Visit: Payer: Medicare HMO | Admitting: Urology

## 2021-08-23 ENCOUNTER — Ambulatory Visit (INDEPENDENT_AMBULATORY_CARE_PROVIDER_SITE_OTHER): Payer: Medicare HMO | Admitting: Internal Medicine

## 2021-08-23 ENCOUNTER — Other Ambulatory Visit: Payer: Self-pay

## 2021-08-23 ENCOUNTER — Encounter: Payer: Self-pay | Admitting: Internal Medicine

## 2021-08-23 VITALS — BP 172/76 | HR 62 | Temp 98.2°F | Ht 73.0 in | Wt 202.0 lb

## 2021-08-23 DIAGNOSIS — E559 Vitamin D deficiency, unspecified: Secondary | ICD-10-CM | POA: Diagnosis not present

## 2021-08-23 DIAGNOSIS — J449 Chronic obstructive pulmonary disease, unspecified: Secondary | ICD-10-CM

## 2021-08-23 DIAGNOSIS — F17210 Nicotine dependence, cigarettes, uncomplicated: Secondary | ICD-10-CM | POA: Diagnosis not present

## 2021-08-23 DIAGNOSIS — M5412 Radiculopathy, cervical region: Secondary | ICD-10-CM | POA: Diagnosis not present

## 2021-08-23 DIAGNOSIS — Z23 Encounter for immunization: Secondary | ICD-10-CM

## 2021-08-23 MED ORDER — TRAMADOL HCL 50 MG PO TABS: 50.0000 mg | ORAL_TABLET | Freq: Four times a day (QID) | ORAL | 0 refills | Status: DC | PRN

## 2021-08-23 MED ORDER — PREDNISONE 10 MG PO TABS: ORAL_TABLET | ORAL | 0 refills | Status: DC

## 2021-08-23 MED ORDER — TIZANIDINE HCL 2 MG PO TABS: 2.0000 mg | ORAL_TABLET | Freq: Four times a day (QID) | ORAL | 1 refills | Status: DC | PRN

## 2021-08-23 NOTE — Progress Notes (Signed)
Patient ID: Andrew [name], male   DOB: [birth year], 70 y.o.   MRN: [id number]       Chief Complaint: follow up left neck pain, copd, vit d def, smoker       HPI:  Andrew [name] is a 70 y.o. male here with c/o 1-2 wks onset mod to severe pain to the left post lat neck burning, constant, with radiation to the left shoulder but not more distal, and no worsening LUE pain, numbness or weakness.  Worse to turn the head to the left, nothing else really makes better or worse.  Pt denies chest pain, increased sob or doe, wheezing, orthopnea, PND, increased LE swelling, palpitations, dizziness or syncope.   Pt denies polydipsia, polyuria, or new focal neuro s/s.   Pt denies fever, wt loss, night sweats, loss of appetite, or other constitutional symptoms  Still smoking not ready to quit.  Due for flu shot       Wt Readings from Last 3 Encounters:  08/23/21 202 lb (91.6 kg)  07/13/21 197 lb (89.4 kg)  06/23/21 197 lb (89.4 kg)   BP Readings from Last 3 Encounters:  08/23/21 (!) 172/76  06/23/21 (!) 124/48  06/09/21 (!) 96/57         Past Medical History:  Diagnosis Date   Asthma    Carotid artery stenosis, asymptomatic, bilateral 12/06/2017   Consider recheck Dopplers in 6 months -> 05/2018 Mild to moderate bilateral carotid artery stenosis Carotid Dopplers done at [facility] cardiovascular 11/26/2017 show left internal carotid artery stenosis 50-69% with soft plaque.  Mild stenosis in the left common carotid artery <50%. Mild stenosis in the [name] common carotid artery <50% with moderate diffuse soft plaque.  Mild stenosis in the [name] e   COCAINE ABUSE 01/30/2008   Qualifier: Diagnosis of  By: Amil Amen MD, Benjamine Mola     COPD (chronic obstructive pulmonary disease) ([place])    Hypertension    Past Surgical History:  Procedure Laterality Date   NO PAST SURGERIES      reports that he has been smoking cigarettes. He has a 11.00 pack-year smoking history. He has never used smokeless tobacco. He reports  current alcohol use of about 2.0 standard drinks per week. He reports that he does not use drugs. family history is not on file. Allergies  Allergen Reactions   Lisinopril Cough   Lisinopril-Hydrochlorothiazide     dizziness   Current Outpatient Medications on File Prior to Visit  Medication Sig Dispense Refill   albuterol (PROVENTIL HFA;VENTOLIN HFA) 108 (90 Base) MCG/ACT inhaler Inhale 2 puffs into the lungs every 4 (four) hours as needed for wheezing or shortness of breath (cough, shortness of breath or wheezing.). 1 Inhaler 11   albuterol (PROVENTIL) (2.5 MG/3ML) 0.083% nebulizer solution Take 3 mLs (2.5 mg total) by nebulization every 6 (six) hours as needed for wheezing or shortness of breath. 150 mL 2   alfuzosin (UROXATRAL) 10 MG 24 hr tablet Take 1 tablet (10 mg total) by mouth daily with breakfast. 90 tablet 3   Budeson-Glycopyrrol-Formoterol (BREZTRI AEROSPHERE) 160-9-4.8 MCG/ACT AERO Inhale 2 puffs into the lungs in the morning and at bedtime. 10.7 g 0   cetirizine (ZYRTEC) 10 MG tablet TAKE 1 TABLET EVERY DAY (Patient taking differently: Take 10 mg by mouth daily. TAKE 1 TABLET EVERY DAY) 90 tablet 4   diltiazem (DILACOR XR) 240 MG 24 hr capsule TAKE 1 CAPSULE EVERY DAY (Patient taking differently: Take 240 mg by mouth daily.) 90 capsule 3  fluticasone (FLONASE) 50 MCG/ACT nasal spray Place 1 spray into both nostrils daily. 32 g 3   guaiFENesin (MUCINEX) 600 MG 12 hr tablet Take 1 tablet (600 mg total) by mouth 2 (two) times daily as needed for cough or to loosen phlegm. 60 tablet 2   olmesartan (BENICAR) 40 MG tablet Take 1 tablet (40 mg total) by mouth daily. 90 tablet 3   tadalafil (CIALIS) 20 MG tablet Take 1 tablet (20 mg total) by mouth daily as needed for erectile dysfunction. 10 tablet 5   triamcinolone (KENALOG) 0.1 % APPLY 1 APPLICATION TOPICALLY 2 (TWO) TIMES DAILY. 80 g 0   atorvastatin (LIPITOR) 20 MG tablet Take 1 tablet (20 mg total) by mouth daily. (Patient not  taking: Reported on 08/23/2021) 90 tablet 3   No current facility-administered medications on file prior to visit.        ROS:  All others reviewed and negative.  Objective        PE:  BP (!) 172/76 (BP Location: [name] Arm, Patient Position: Sitting, Cuff Size: Large)   Pulse 62   Temp 98.2 F (36.8 C) (Oral)   Ht 6\' 1"  (1.854 m)   Wt 202 lb (91.6 kg)   SpO2 96%   BMI 26.65 kg/m                 Constitutional: Pt appears in NAD               HENT: Head: NCAT.                [name] Ear: External ear normal.                 Left Ear: External ear normal.                Eyes: . Pupils are equal, round, and reactive to light. Conjunctivae and EOM are normal               Nose: without d/c or deformity               Neck: Neck supple. Gross normal ROM, nontender post neck               Cardiovascular: Normal rate and regular rhythm.                 Pulmonary/Chest: Effort normal and breath sounds without rales or wheezing.                Abd:  Soft, NT, ND, + BS, no organomegaly               Neurological: Pt is alert. At baseline orientation, motor intact 5/5. LUe with sensation intact               Skin: Skin is warm. No rashes, no other new lesions, LE edema - none               Psychiatric: Pt behavior is normal without agitation   Micro: none  Cardiac tracings I have personally interpreted today:  none  Pertinent Radiological findings (summarize): none   Lab Results  Component Value Date   WBC 6.3 07/08/2018   HGB 14.2 07/08/2018   HCT 41.8 07/08/2018   PLT 268.0 07/08/2018   GLUCOSE 89 05/05/2021   CHOL 263 (H) 10/02/2019   TRIG 127 10/02/2019   HDL 60 10/02/2019   LDLDIRECT 146 (H) 06/23/2021   LDLCALC 180 (H) 10/02/2019   ALT  10 10/02/2019   AST 19 10/02/2019   NA 145 (H) 05/05/2021   K 4.5 05/05/2021   CL 107 (H) 05/05/2021   CREATININE 1.81 (H) 05/05/2021   BUN 20 05/05/2021   CO2 24 05/05/2021   PSA 5.4 (H) 09/14/2016   HGBA1C 5.6 10/02/2019    Assessment/Plan:  Andrew [name] is a 70 y.o. Black or African American [2] male with  has a past medical history of Asthma, Carotid artery stenosis, asymptomatic, bilateral (12/06/2017), COCAINE ABUSE (01/30/2008), COPD (chronic obstructive pulmonary disease) ([place]), and Hypertension.  Vitamin D deficiency Last vitamin D Lab Results  Component Value Date   VD25OH 70.1 09/16/2018   Stable, cont oral replacement   Cigarette smoker Counseled to quit, pt not ready  COPD GOLD 2 still smoking  Symptomatically stable,  to f/u any worsening symptoms or concerns  Cervical radiculitis Hx and exam c/w neuritic pain most likely, for trial tramadol prn, tizanidine 2 mg prn, and predpac asd, and consider imaging if persistent/worsens ,  to f/u any worsening symptoms or concerns  Followup: Return if symptoms worsen or fail to improve.  Cathlean Cower, MD 08/26/2021 3:05 PM [facility]

## 2021-08-23 NOTE — Patient Instructions (Signed)
You had the flu shot today  Please take all new medication as prescribed - the pain medication as needed, muscle relaxer as needed, and prednisone (anti-inflammatory) for probable Left Cervical Radiculitis  Please continue all other medications as before, and refills have been done if requested.  Please have the pharmacy call with any other refills you may need.  Please keep your appointments with your specialists as you may have planned

## 2021-08-25 ENCOUNTER — Ambulatory Visit: Payer: Medicare HMO | Admitting: Internal Medicine

## 2021-08-26 ENCOUNTER — Encounter: Payer: Self-pay | Admitting: Internal Medicine

## 2021-08-26 NOTE — Assessment & Plan Note (Signed)
Symptomatically stable,  to f/u any worsening symptoms or concerns

## 2021-08-26 NOTE — Assessment & Plan Note (Signed)
Counseled to quit, pt not ready 

## 2021-08-26 NOTE — Assessment & Plan Note (Signed)
Hx and exam c/w neuritic pain most likely, for trial tramadol prn, tizanidine 2 mg prn, and predpac asd, and consider imaging if persistent/worsens ,  to f/u any worsening symptoms or concerns

## 2021-08-26 NOTE — Assessment & Plan Note (Signed)
Last vitamin D Lab Results  Component Value Date   VD25OH 70.1 09/16/2018   Stable, cont oral replacement

## 2021-09-01 ENCOUNTER — Ambulatory Visit: Payer: Medicare HMO | Admitting: Internal Medicine

## 2021-09-06 ENCOUNTER — Telehealth: Payer: Self-pay

## 2021-09-06 NOTE — Telephone Encounter (Signed)
Received message from [place] at Dr. Alfredo Bach office to coordinate surgery with Dr. Alyson Ingles for Seed placement with space OAR. Message sent to MD.

## 2021-09-08 ENCOUNTER — Encounter: Payer: Self-pay | Admitting: Internal Medicine

## 2021-09-08 ENCOUNTER — Ambulatory Visit: Payer: Medicare HMO | Admitting: Internal Medicine

## 2021-09-08 ENCOUNTER — Other Ambulatory Visit: Payer: Self-pay

## 2021-09-08 DIAGNOSIS — F17210 Nicotine dependence, cigarettes, uncomplicated: Secondary | ICD-10-CM

## 2021-09-08 DIAGNOSIS — J449 Chronic obstructive pulmonary disease, unspecified: Secondary | ICD-10-CM | POA: Diagnosis not present

## 2021-09-08 MED ORDER — FLUTICASONE-SALMETEROL 250-50 MCG/ACT IN AEPB: 1.0000 | INHALATION_SPRAY | Freq: Two times a day (BID) | RESPIRATORY_TRACT | Status: DC

## 2021-09-08 NOTE — Assessment & Plan Note (Addendum)
Active smoker 07/19/17 CT Mild centrilobular emphysematous changes, upper lobe predominant. - Spirometry 09/20/2017  FEV1 1.45 (42%)  Ratio 50 with typical curvature > dulera 200 (humana) 2bid   -  PFT's  12/09/2017  FEV1 2.38 (72 % ) ratio 60  p 1 % improvement from saba p symbicort 160 prior to study with DLCO  62/66 % corrects to 71  % for alv volume   - 12/09/2017   continue dulera 200 or symb 160 depending on insurance  06/16/2019  After extensive coaching inhaler device,  effectiveness =   90%  - 12/01/2020  After extensive coaching inhaler device,  effectiveness =    75% (short ti)   - PFT's   03/03/21  FEV1 2.0 (62 % ) ratio 0.58  p 0 % improvement from saba p breztri prior to study with DLCO  21.20 (75%) corrects to 3.30 (81%)  for alv volume and FV curve classic mild concavity     Group D in terms of symptom/risk and laba/lama/ICS  therefore appropriate rx at this point >>>  breztri but can't afford it so continue advair and prn saba

## 2021-09-08 NOTE — Progress Notes (Signed)
Subjective:     Patient ID: Andrew [name], male   DOB: September 03, 1951,    MRN: [id number]     Brief patient profile:  7  yobm active smoker referred to pulmonary clinic 09/20/2017 by Dr   Ranee Gosselin for copd eval with GOLD II criteria documented 12/09/17    History of Present Illness  09/20/2017 1st [facility] office visit/ Andrew [name]   Chief Complaint  Patient presents with   Pulmonary Consult    Referred by [name] for eval of COPD. Pt states his breathing is currently at baseline. He uses an albuterol inhaler 2-3 x per wk and has atrovent and albuterol nebs that he rarely uses.    exac 07/21/17 did not req admit with rec stating on Breo 200 denies taking too expensive so started symb ? Strength p er rx but costs 300 per month  Baseline doe now on rx = best days Not limited by breathing from desired activities lots of fast pace walking at [employer]/ some hills ok  rec Plan A = Automatic = symbiocort 160=dulera 200 Take 2 puffs first thing in am and then another 2 puffs about 12 hours later.  Work on inhaler technique:  relax and gently blow all the way out then take a nice smooth deep breath back in, triggering the inhaler at same time you start breathing in.  Hold for up to 5 seconds if you can. Blow out thru nose. Rinse and gargle with water when done Plan B = Backup Only use your albuterol as a rescue medication Plan C = Crisis - only use your albuterol nebulizer if you first try Plan B   12/01/2020  f/u ov/Merci Walthers re: GOLD II  Chief Complaint  Patient presents with   Follow-up    Productive cough at night with clear phlegm   Dyspnea: MMRC2 = can't walk a nl pace on a flat grade s sob but does fine slow and flat  Cough: min nocturnal/ mucoid  Sleeping: flat bed with pillows = baseline  SABA use: 2 x daily  02: none  Rec Plan A = Automatic = symbiocort 160  Take 2 puffs first thing in am and then another 2 puffs about 12 hours later.  Work on inhaler  technique:e Prednisone take one half daily until gone  Plan B = Backup Only use your albuterol as a rescue medication)  Plan C = Crisis - only use your albuterol nebulizer if you first try Plan B and it fails to help > ok to use the nebulizer up to every 4 hours but if start needing it regularly call for immediate appointment  very strongly recommend you get the  pfizer booster vaccine Please schedule a follow up visit in 3 months but call sooner if needed with pfts and your drug formulary     09/08/2021  f/u ov/Andrew [name] re: copd GOLD 2 / still moking some   maint on symbicort 2bid insurance would not cover breztri   Chief Complaint  Patient presents with   Follow-up    Breathing is overall doing well and no new co's. He has been out of [place] for a few months. He uses his albuterol inhaler about 3 x per wk.     Dyspnea:  MMRC2 = can't walk a nl pace on a flat grade s sob but does fine slow and flat eg walking large stores / slt better on breztri vs advair but ca't afford the former and advair is free  Cough: none x in ams > clear only  Sleeping: flat bed 3 pillows s resp cc  SABA use: rarely  02: none  Covid status:   vax Please schedule a follow up visit in 3 months but call sooner if needed    No obvious day to day or daytime variability or assoc excess/ purulent sputum or mucus plugs or hemoptysis or cp or chest tightness, subjective wheeze or overt sinus or hb symptoms.   Sleeping as above  without nocturnal  or early am exacerbation  of respiratory  c/o's or need for noct saba. Also denies any obvious fluctuation of symptoms with weather or environmental changes or other aggravating or alleviating factors except as outlined above   No unusual exposure hx or h/o childhood pna/ asthma or knowledge of premature birth.  Current Allergies, Complete Past Medical History, Past Surgical History, Family History, and Social History were reviewed in [employer]  record.  ROS  The following are not active complaints unless bolded Hoarseness, sore throat, dysphagia, dental problems, itching, sneezing,  nasal congestion or discharge of excess mucus or purulent secretions, ear ache,   fever, chills, sweats, unintended wt loss or wt gain, classically pleuritic or exertional cp,  orthopnea pnd or arm/hand swelling  or leg swelling, presyncope, palpitations, abdominal pain, anorexia, nausea, vomiting, diarrhea  or change in bowel habits or change in bladder habits, change in stools or change in urine, dysuria, hematuria,  rash, arthralgias, visual complaints, headache, numbness, weakness or ataxia or problems with walking or coordination,  change in mood or  memory.        Current Meds  Medication Sig   albuterol (PROVENTIL HFA;VENTOLIN HFA) 108 (90 Base) MCG/ACT inhaler Inhale 2 puffs into the lungs every 4 (four) hours as needed for wheezing or shortness of breath (cough, shortness of breath or wheezing.).   albuterol (PROVENTIL) (2.5 MG/3ML) 0.083% nebulizer solution Take 3 mLs (2.5 mg total) by nebulization every 6 (six) hours as needed for wheezing or shortness of breath.   alfuzosin (UROXATRAL) 10 MG 24 hr tablet Take 1 tablet (10 mg total) by mouth daily with breakfast.   cetirizine (ZYRTEC) 10 MG tablet TAKE 1 TABLET EVERY DAY (Patient taking differently: Take 10 mg by mouth daily. TAKE 1 TABLET EVERY DAY)   diltiazem (DILACOR XR) 240 MG 24 hr capsule TAKE 1 CAPSULE EVERY DAY (Patient taking differently: Take 240 mg by mouth daily.)   fluticasone (FLONASE) 50 MCG/ACT nasal spray Place 1 spray into both nostrils daily.   guaiFENesin (MUCINEX) 600 MG 12 hr tablet Take 1 tablet (600 mg total) by mouth 2 (two) times daily as needed for cough or to loosen phlegm.   olmesartan (BENICAR) 40 MG tablet Take 1 tablet (40 mg total) by mouth daily.   tadalafil (CIALIS) 20 MG tablet Take 1 tablet (20 mg total) by mouth daily as needed for erectile dysfunction.    tiZANidine (ZANAFLEX) 2 MG tablet Take 1 tablet (2 mg total) by mouth every 6 (six) hours as needed for muscle spasms.   traMADol (ULTRAM) 50 MG tablet Take 1 tablet (50 mg total) by mouth every 6 (six) hours as needed.   triamcinolone (KENALOG) 0.1 % APPLY 1 APPLICATION TOPICALLY 2 (TWO) TIMES DAILY.               Objective:   Physical Exam   09/08/2021     198  12/01/2020       201 09/06/2020  200  06/16/2019        194  07/08/2018       204  12/09/2017       207   09/20/17 202 lb (91.6 kg)  09/19/17 202 lb (91.6 kg)  09/03/17 201 lb 3.2 oz (91.3 kg)      Vital signs reviewed  09/08/2021  - Note at rest 02 sats  98% on RA   General appearance:    amb pleasant bm nad    HEENT: Upper plate/ lower partial   HEENT : pt wearing mask not removed for exam due to covid - 19 concerns.    NECK :  without JVD/Nodes/TM/ nl carotid upstrokes bilaterally   LUNGS: no acc muscle use,  Mild barrel  contour chest wall with bilateral  Distant bs s audible wheeze and  without cough on insp or exp maneuvers  and mild  Hyperresonant  to  percussion bilaterally     CV:  RRR  no s3 or murmur or increase in P2, and no edema   ABD:  soft and nontender with pos end  insp Hoover's  in the supine position. No bruits or organomegaly appreciated, bowel sounds nl  MS:   Nl gait/  ext warm without deformities, calf tenderness, cyanosis or clubbing No obvious joint restrictions   SKIN: warm and dry without lesions    NEURO:  alert, approp, nl sensorium with  no motor or cerebellar deficits apparent.           Assessment:

## 2021-09-08 NOTE — Patient Instructions (Signed)
No change in recommendations  Please schedule a follow up visit in 12 months but call sooner if needed  

## 2021-09-08 NOTE — Assessment & Plan Note (Signed)
Counseled re importance of smoking cessation but did not meet time criteria for separate billing    Low-dose CT lung cancer screening is recommended for patients who are 54-70 years of age with a 20+ pack-year history of smoking, and who are currently smoking or quit <=15 years ago. No coughing up blood  No unintentional weight loss of > 15 pounds in the last 6 months  >>> continue annual screening          Each maintenance medication was reviewed in detail including emphasizing most importantly the difference between maintenance and prns and under what circumstances the prns are to be triggered using an action plan format where appropriate.  Total time for H and P, chart review, counseling, reviewing dpi/hfa device(s) and generating customized AVS unique to this office visit / same day charting =25 min

## 2021-09-22 ENCOUNTER — Other Ambulatory Visit: Payer: Self-pay

## 2021-09-22 ENCOUNTER — Ambulatory Visit: Payer: Medicare HMO | Admitting: Urology

## 2021-09-25 NOTE — Telephone Encounter (Signed)
Surgery scheduled 

## 2021-10-13 ENCOUNTER — Ambulatory Visit: Payer: Medicare HMO | Admitting: Cardiology

## 2021-10-16 ENCOUNTER — Other Ambulatory Visit: Payer: Self-pay | Admitting: Internal Medicine

## 2021-10-16 NOTE — Telephone Encounter (Signed)
To pcp please 

## 2021-10-23 ENCOUNTER — Ambulatory Visit (INDEPENDENT_AMBULATORY_CARE_PROVIDER_SITE_OTHER): Payer: Medicare HMO | Admitting: Emergency Medicine

## 2021-10-23 ENCOUNTER — Other Ambulatory Visit: Payer: Self-pay

## 2021-10-23 ENCOUNTER — Encounter: Payer: Self-pay | Admitting: Emergency Medicine

## 2021-10-23 VITALS — BP 130/70 | HR 66 | Temp 98.7°F | Ht 73.0 in | Wt 205.0 lb

## 2021-10-23 DIAGNOSIS — C61 Malignant neoplasm of prostate: Secondary | ICD-10-CM

## 2021-10-23 DIAGNOSIS — R7303 Prediabetes: Secondary | ICD-10-CM

## 2021-10-23 DIAGNOSIS — N1831 Chronic kidney disease, stage 3a: Secondary | ICD-10-CM | POA: Diagnosis not present

## 2021-10-23 DIAGNOSIS — I10 Essential (primary) hypertension: Secondary | ICD-10-CM | POA: Diagnosis not present

## 2021-10-23 DIAGNOSIS — I700 Atherosclerosis of aorta: Secondary | ICD-10-CM | POA: Diagnosis not present

## 2021-10-23 DIAGNOSIS — F17210 Nicotine dependence, cigarettes, uncomplicated: Secondary | ICD-10-CM

## 2021-10-23 DIAGNOSIS — J449 Chronic obstructive pulmonary disease, unspecified: Secondary | ICD-10-CM

## 2021-10-23 MED ORDER — ROSUVASTATIN CALCIUM 10 MG PO TABS: 10.0000 mg | ORAL_TABLET | Freq: Every day | ORAL | 3 refills | Status: DC

## 2021-10-23 NOTE — Assessment & Plan Note (Signed)
Cardiovascular and cancer risks associated with smoking discussed. °Smoking cessation advice given. °

## 2021-10-23 NOTE — Assessment & Plan Note (Signed)
Smoking a lot less.  Symbicort helping a great deal.  Stable and well-controlled.

## 2021-10-23 NOTE — Assessment & Plan Note (Signed)
Well-controlled hypertension with normal blood pressure readings at home.  Continue olmesartan 40 mg and diltiazem 240 mg daily.

## 2021-10-23 NOTE — Assessment & Plan Note (Signed)
Stable.  Advised to stay well-hydrated and avoid NSAIDs. 

## 2021-10-23 NOTE — Progress Notes (Signed)
Andrew [name] 70 y.o.   Chief Complaint  Patient presents with   Hypertension    HISTORY OF PRESENT ILLNESS: This is a 70 y.o. male here for follow-up of hypertension. Normal blood pressure readings at home.  Slightly elevated in the office. Recently diagnosed with prostate cancer and anal polyp. Scheduled for polyp surgery on 10/30/2021 and radiation treatment for prostate 10 days later. COPD on Symbicort.  Smoking a lot less. No other complaints or medical concerns today. BP Readings from Last 3 Encounters:  09/08/21 128/66  08/23/21 (!) 172/76  06/23/21 (!) 124/48     Hypertension Pertinent negatives include no chest pain, headaches, palpitations or shortness of breath.    Prior to Admission medications   Medication Sig Start Date End Date Taking? Authorizing Provider  albuterol (PROVENTIL HFA;VENTOLIN HFA) 108 (90 Base) MCG/ACT inhaler Inhale 2 puffs into the lungs every 4 (four) hours as needed for wheezing or shortness of breath (cough, shortness of breath or wheezing.). 06/29/17  Yes Shawnee Knapp, MD  albuterol (PROVENTIL) (2.5 MG/3ML) 0.083% nebulizer solution Take 3 mLs (2.5 mg total) by nebulization every 6 (six) hours as needed for wheezing or shortness of breath. 03/29/20  Yes Clarabelle Oscarson, Ines Bloomer, MD  alfuzosin (UROXATRAL) 10 MG 24 hr tablet Take 1 tablet (10 mg total) by mouth daily with breakfast. 01/09/21  Yes McKenzie, Candee Furbish, MD  cetirizine (ZYRTEC) 10 MG tablet TAKE 1 TABLET EVERY DAY Patient taking differently: Take 10 mg by mouth daily. TAKE 1 TABLET EVERY DAY 06/14/21  Yes Adja Ruff, Ines Bloomer, MD  diltiazem (DILACOR XR) 240 MG 24 hr capsule TAKE 1 CAPSULE EVERY DAY Patient taking differently: Take 240 mg by mouth daily. 05/31/21  Yes Tyra Gural, Ines Bloomer, MD  fluticasone [facility]) 50 MCG/ACT nasal spray Place 1 spray into both nostrils daily. 05/11/21  Yes Tanda Rockers, MD  fluticasone-salmeterol (ADVAIR) 250-50 MCG/ACT AEPB Inhale 1 puff into the lungs  every 12 (twelve) hours. 09/08/21  Yes Tanda Rockers, MD  guaiFENesin (MUCINEX) 600 MG 12 hr tablet Take 1 tablet (600 mg total) by mouth 2 (two) times daily as needed for cough or to loosen phlegm. 03/03/21  Yes Martyn Ehrich, NP  olmesartan (BENICAR) 40 MG tablet Take 1 tablet (40 mg total) by mouth daily. 12/28/20  Yes Yariana Hoaglund, Ines Bloomer, MD  tadalafil (CIALIS) 20 MG tablet Take 1 tablet (20 mg total) by mouth daily as needed for erectile dysfunction. 01/09/21  Yes McKenzie, Candee Furbish, MD  tiZANidine (ZANAFLEX) 2 MG tablet Take 1 tablet (2 mg total) by mouth every 6 (six) hours as needed for muscle spasms. 08/23/21  Yes Biagio Borg, MD  traMADol (ULTRAM) 50 MG tablet TAKE 1 TABLET BY MOUTH EVERY 6 HOURS AS NEEDED 10/17/21  Yes Jennessy Sandridge, Ines Bloomer, MD  triamcinolone (KENALOG) 0.1 % APPLY 1 APPLICATION TOPICALLY 2 (TWO) TIMES DAILY. 12/24/20  Yes Horald Pollen, MD    Allergies  Allergen Reactions   Lisinopril Cough   Lisinopril-Hydrochlorothiazide     dizziness    Patient Active Problem List   Diagnosis Date Noted   Cervical radiculitis 08/23/2021   Preop cardiovascular exam 06/23/2021   Atherosclerosis of aorta ([place]) 04/20/2021   Acquired thrombophilia ([place]) 05/05/2020   Prostate cancer ([place]) 01/14/2018   Carotid artery stenosis, asymptomatic, bilateral 12/06/2017   Vitamin D deficiency 12/03/2017   Prediabetes 11/14/2017   Chronic kidney disease (CKD) stage G3a/A2, moderately decreased glomerular filtration rate (GFR) between 45-59 mL/min/1.73 square meter and  albuminuria creatinine ratio between 30-299 mg/g (HCC) 11/13/2017   Cigarette smoker 08/20/2017   COPD GOLD 2 still smoking  07/31/2017   Coronary atherosclerosis of native coronary artery 07/31/2017   Benign prostatic hyperplasia with urinary obstruction 05/30/2016   Allergic rhinitis 06/01/2008   DENTAL CARIES 06/01/2008   GERD 03/04/2008   Hyperlipidemia LDL goal <70 01/30/2008   Essential hypertension  01/30/2008   PERIPHERAL VASCULAR DISEASE 01/30/2008   COCAINE ABUSE 01/30/2008    Past Medical History:  Diagnosis Date   Asthma    Carotid artery stenosis, asymptomatic, bilateral 12/06/2017   Consider recheck Dopplers in 6 months -> 05/2018 Mild to moderate bilateral carotid artery stenosis Carotid Dopplers done at [facility] cardiovascular 11/26/2017 show left internal carotid artery stenosis 50-69% with soft plaque.  Mild stenosis in the left common carotid artery <50%. Mild stenosis in the [name] common carotid artery <50% with moderate diffuse soft plaque.  Mild stenosis in the [name] e   COCAINE ABUSE 01/30/2008   Qualifier: Diagnosis of  By: Amil Amen MD, Benjamine Mola     COPD (chronic obstructive pulmonary disease) ([place])    Hypertension     Past Surgical History:  Procedure Laterality Date   NO PAST SURGERIES      Social History   Socioeconomic History   Marital status: Married    Spouse name: Not on file   Number of children: 1   Years of education: Not on file   Highest education level: Not on file  Occupational History   Not on file  Tobacco Use   Smoking status: Some Days    Packs/day: 0.25    Years: 44.00    Pack years: 11.00    Types: Cigarettes   Smokeless tobacco: Never  Vaping Use   Vaping Use: Never used  Substance and Sexual Activity   Alcohol use: Yes    Alcohol/week: 2.0 standard drinks    Types: 1 Cans of beer, 1 Shots of liquor per week    Comment: occasional   Drug use: No   Sexual activity: Not on file  Other Topics Concern   Not on file  Social History Narrative   Not on file   Social Determinants of Health   Financial Resource Strain: Not on file  Food Insecurity: Not on file  Transportation Needs: Not on file  Physical Activity: Not on file  Stress: Not on file  Social Connections: Not on file  Intimate Partner Violence: Not on file    Family History  Problem Relation Age of Onset   Colon cancer Neg Hx    Esophageal cancer Neg Hx     Rectal cancer Neg Hx    Stomach cancer Neg Hx    Colon polyps Neg Hx      Review of Systems  Constitutional: Negative.  Negative for chills and fever.  HENT: Negative.  Negative for congestion and sore throat.   Eyes: Negative.   Respiratory: Negative.  Negative for cough and shortness of breath.   Cardiovascular: Negative.  Negative for chest pain and palpitations.  Gastrointestinal: Negative.  Negative for abdominal pain, diarrhea, nausea and vomiting.  Genitourinary: Negative.  Negative for dysuria and hematuria.  Skin: Negative.  Negative for rash.  Neurological: Negative.  Negative for dizziness and headaches.  All other systems reviewed and are negative.  Today's Vitals   10/23/21 1548 10/23/21 1602  BP: (!) 156/80 130/70  Pulse: 66   Temp: 98.7 F (37.1 C)   TempSrc: Oral   SpO2: 95%  Weight: 205 lb (93 kg)   Height: 6\' 1"  (1.854 m)    Body mass index is 27.05 kg/m.  Physical Exam Vitals reviewed.  Constitutional:      Appearance: Normal appearance.  HENT:     Head: Normocephalic.  Eyes:     Extraocular Movements: Extraocular movements intact.     Conjunctiva/sclera: Conjunctivae normal.     Pupils: Pupils are equal, round, and reactive to light.  Cardiovascular:     Rate and Rhythm: Normal rate and regular rhythm.     Pulses: Normal pulses.     Heart sounds: Normal heart sounds.  Pulmonary:     Effort: Pulmonary effort is normal.     Breath sounds: Normal breath sounds.  Musculoskeletal:        General: Normal range of motion.     Cervical back: No tenderness.     [name] lower leg: No edema.     Left lower leg: No edema.  Lymphadenopathy:     Cervical: No cervical adenopathy.  Skin:    Capillary Refill: Capillary refill takes less than 2 seconds.  Neurological:     General: No focal deficit present.     Mental Status: He is alert and oriented to person, place, and time.  Psychiatric:        Mood and Affect: Mood normal.        Behavior: Behavior  normal.     ASSESSMENT & PLAN: Problem List Items Addressed This Visit       Cardiovascular and Mediastinum   Essential hypertension - Primary    Well-controlled hypertension with normal blood pressure readings at home.  Continue olmesartan 40 mg and diltiazem 240 mg daily.      Relevant Medications   rosuvastatin (CRESTOR) 10 MG tablet   Atherosclerosis of aorta (HCC)    Diet and nutrition discussed.  The 10-year ASCVD risk score (Arnett DK, et al., 2019) is: 32%   Values used to calculate the score:     Age: 33 years     Sex: Male     Is Non-Hispanic African American: Yes     Diabetic: No     Tobacco smoker: Yes     Systolic Blood Pressure: 810 mmHg     Is BP treated: Yes     HDL Cholesterol: 60 mg/dL     Total Cholesterol: 263 mg/dL We will start rosuvastatin 10 mg daily.      Relevant Medications   rosuvastatin (CRESTOR) 10 MG tablet     Respiratory   COPD GOLD 2 still smoking     Smoking a lot less.  Symbicort helping a great deal.  Stable and well-controlled.        Genitourinary   Chronic kidney disease (CKD) stage G3a/A2, moderately decreased glomerular filtration rate (GFR) between 45-59 mL/min/1.73 square meter and albuminuria creatinine ratio between 30-299 mg/g (HCC) (Chronic)    Stable.  Advised to stay well-hydrated and avoid NSAIDs.      Prostate cancer ([place])    Scheduled for radiation treatment later this month.        Other   Cigarette smoker    Cardiovascular and cancer risks associated with smoking discussed. Smoking cessation advice given.      Prediabetes   Patient Instructions  Hypertension, Adult High blood pressure (hypertension) is when the force of blood pumping through the arteries is too strong. The arteries are the blood vessels that carry blood from the heart throughout the body. Hypertension forces the heart  to work harder to pump blood and may cause arteries to become narrow or stiff. Untreated or uncontrolled hypertension  can cause a heart attack, heart failure, a stroke, kidney disease, and other problems. A blood pressure reading consists of a higher number over a lower number. Ideally, your blood pressure should be below 120/80. The first ("top") number is called the systolic pressure. It is a measure of the pressure in your arteries as your heart beats. The second ("bottom") number is called the diastolic pressure. It is a measure of the pressure in your arteries as the heart relaxes. What are the causes? The exact cause of this condition is not known. There are some conditions that result in or are related to high blood pressure. What increases the risk? Some risk factors for high blood pressure are under your control. The following factors may make you more likely to develop this condition: Smoking. Having type 2 diabetes mellitus, high cholesterol, or both. Not getting enough exercise or physical activity. Being overweight. Having too much fat, sugar, calories, or salt (sodium) in your diet. Drinking too much alcohol. Some risk factors for high blood pressure may be difficult or impossible to change. Some of these factors include: Having chronic kidney disease. Having a family history of high blood pressure. Age. Risk increases with age. Race. You may be at higher risk if you are African American. Gender. Men are at higher risk than women before age 52. After age 23, women are at higher risk than men. Having obstructive sleep apnea. Stress. What are the signs or symptoms? High blood pressure may not cause symptoms. Very high blood pressure (hypertensive crisis) may cause: Headache. Anxiety. Shortness of breath. Nosebleed. Nausea and vomiting. Vision changes. Severe chest pain. Seizures. How is this diagnosed? This condition is diagnosed by measuring your blood pressure while you are seated, with your arm resting on a flat surface, your legs uncrossed, and your feet flat on the floor. The cuff of  the blood pressure monitor will be placed directly against the skin of your upper arm at the level of your heart. It should be measured at least twice using the same arm. Certain conditions can cause a difference in blood pressure between your [name] and left arms. Certain factors can cause blood pressure readings to be lower or higher than normal for a short period of time: When your blood pressure is higher when you are in a health care provider's office than when you are at home, this is called white coat hypertension. Most people with this condition do not need medicines. When your blood pressure is higher at home than when you are in a health care provider's office, this is called masked hypertension. Most people with this condition may need medicines to control blood pressure. If you have a high blood pressure reading during one visit or you have normal blood pressure with other risk factors, you may be asked to: Return on a different day to have your blood pressure checked again. Monitor your blood pressure at home for 1 week or longer. If you are diagnosed with hypertension, you may have other blood or imaging tests to help your health care provider understand your overall risk for other conditions. How is this treated? This condition is treated by making healthy lifestyle changes, such as eating healthy foods, exercising more, and reducing your alcohol intake. Your health care provider may prescribe medicine if lifestyle changes are not enough to get your blood pressure under control, and if:  Your systolic blood pressure is above 130. Your diastolic blood pressure is above 80. Your personal target blood pressure may vary depending on your medical conditions, your age, and other factors. Follow these instructions at home: Eating and drinking  Eat a diet that is high in fiber and potassium, and low in sodium, added sugar, and fat. An example eating plan is called the DASH (Dietary Approaches to  Stop Hypertension) diet. To eat this way: Eat plenty of fresh fruits and vegetables. Try to fill one half of your plate at each meal with fruits and vegetables. Eat whole grains, such as whole-wheat pasta, brown rice, or whole-grain bread. Fill about one fourth of your plate with whole grains. Eat or drink low-fat dairy products, such as skim milk or low-fat yogurt. Avoid fatty cuts of meat, processed or cured meats, and poultry with skin. Fill about one fourth of your plate with lean proteins, such as fish, chicken without skin, beans, eggs, or tofu. Avoid pre-made and processed foods. These tend to be higher in sodium, added sugar, and fat. Reduce your daily sodium intake. Most people with hypertension should eat less than 1,500 mg of sodium a day. Do not drink alcohol if: Your health care provider tells you not to drink. You are pregnant, may be pregnant, or are planning to become pregnant. If you drink alcohol: Limit how much you use to: 0-1 drink a day for women. 0-2 drinks a day for men. Be aware of how much alcohol is in your drink. In the [place] one drink equals one 12 oz bottle of beer (355 mL), one 5 oz glass of wine (148 mL), or one 1 oz glass of hard liquor (44 mL). Lifestyle  Work with your health care provider to maintain a healthy body weight or to lose weight. Ask what an ideal weight is for you. Get at least 30 minutes of exercise most days of the week. Activities may include walking, swimming, or biking. Include exercise to strengthen your muscles (resistance exercise), such as Pilates or lifting weights, as part of your weekly exercise routine. Try to do these types of exercises for 30 minutes at least 3 days a week. Do not use any products that contain nicotine or tobacco, such as cigarettes, e-cigarettes, and chewing tobacco. If you need help quitting, ask your health care provider. Monitor your blood pressure at home as told by your health care provider. Keep all follow-up  visits as told by your health care provider. This is important. Medicines Take over-the-counter and prescription medicines only as told by your health care provider. Follow directions carefully. Blood pressure medicines must be taken as prescribed. Do not skip doses of blood pressure medicine. Doing this puts you at risk for problems and can make the medicine less effective. Ask your health care provider about side effects or reactions to medicines that you should watch for. Contact a health care provider if you: Think you are having a reaction to a medicine you are taking. Have headaches that keep coming back (recurring). Feel dizzy. Have swelling in your ankles. Have trouble with your vision. Get help [name] away if you: Develop a severe headache or confusion. Have unusual weakness or numbness. Feel faint. Have severe pain in your chest or abdomen. Vomit repeatedly. Have trouble breathing. Summary Hypertension is when the force of blood pumping through your arteries is too strong. If this condition is not controlled, it may put you at risk for serious complications. Your personal target blood pressure may  vary depending on your medical conditions, your age, and other factors. For most people, a normal blood pressure is less than 120/80. Hypertension is treated with lifestyle changes, medicines, or a combination of both. Lifestyle changes include losing weight, eating a healthy, low-sodium diet, exercising more, and limiting alcohol. This information is not intended to replace advice given to you by your health care provider. Make sure you discuss any questions you have with your health care provider. Document Revised: 07/09/2018 Document Reviewed: 07/09/2018 Elsevier Patient Education  2022 [place], MD [facility]

## 2021-10-23 NOTE — Assessment & Plan Note (Signed)
Scheduled for radiation treatment later this month.

## 2021-10-23 NOTE — Assessment & Plan Note (Signed)
Diet and nutrition discussed.  The 10-year ASCVD risk score (Arnett DK, et al., 2019) is: 32%   Values used to calculate the score:     Age: 70 years     Sex: Male     Is Non-Hispanic African American: Yes     Diabetic: No     Tobacco smoker: Yes     Systolic Blood Pressure: 890 mmHg     Is BP treated: Yes     HDL Cholesterol: 60 mg/dL     Total Cholesterol: 263 mg/dL We will start rosuvastatin 10 mg daily.

## 2021-10-23 NOTE — Patient Instructions (Signed)

## 2021-10-26 ENCOUNTER — Encounter (HOSPITAL_BASED_OUTPATIENT_CLINIC_OR_DEPARTMENT_OTHER): Payer: Self-pay | Admitting: Surgery

## 2021-10-27 ENCOUNTER — Other Ambulatory Visit: Payer: Self-pay

## 2021-10-27 ENCOUNTER — Telehealth: Payer: Self-pay | Admitting: Internal Medicine

## 2021-10-27 ENCOUNTER — Encounter (HOSPITAL_BASED_OUTPATIENT_CLINIC_OR_DEPARTMENT_OTHER): Payer: Self-pay | Admitting: Surgery

## 2021-10-27 ENCOUNTER — Encounter (HOSPITAL_COMMUNITY): Payer: Self-pay | Admitting: Anesthesiology

## 2021-10-27 MED ORDER — PREDNISONE 10 MG PO TABS: ORAL_TABLET | ORAL | 0 refills | Status: AC

## 2021-10-27 NOTE — Telephone Encounter (Signed)
I called and spoke with and he voices understanding. Nothing further needed. He is aware if he is not feeling better to call and cancel his procedure. He did not have any other questions.

## 2021-10-27 NOTE — Telephone Encounter (Signed)
Try Prednisone 10 mg take  4 each am x 2 days,   2 each am x 2 days,  1 each am x 2 days and stop   If not better will need to cancel procedure

## 2021-10-27 NOTE — Telephone Encounter (Signed)
I called the patient back and he reports that he is having more shortness of breath with exertion with the weather change of it getting colder that he is having use his Symbicort in the morning and at night time as well. He reports he has had to use his back up inhaler at least 3 times a day in the last couple of weeks. He is due to have a procedure on 10/30/21 and wants to know if he needs to wait to have the procedure done until another day. Please advise.

## 2021-10-27 NOTE — Progress Notes (Signed)
Spoke w/ via phone for pre-op interview--- pt Lab needs dos---- [employer] results------ current ekg in epic / chart COVID test ---- no test needed Arrive at ------- 0830 on 10-30-2021 NPO after MN NO Solid Food.  Clear liquids from MN until--- 0730 Med rec completed Medications to take morning of surgery ----- zyrtec, crestor, diltiazem, uroxatral, prednisone, flonase spray, symbicort inhaler Diabetic medication ----- n/a Patient instructed no nail polish to be worn day of surgery Patient instructed to bring photo id and insurance card day of surgery Patient aware to have Driver (ride ) / caregiver  for 24 hours after surgery --wife, gwen Patient Special Instructions ----- pt to bring rescue inhaler dos  Pre-Op special Istructions ----- pt cleared for surgery by pcp, Dr Leane Platt, note in epic 10-23-2021;   also, pt has telephone cardiac clearance by Kathyrn Drown NP 08-08-2021 in epic/ chart  Patient verbalized understanding of instructions that were given at this phone interview. Patient denies chest pain, fever, cough at this phone interview.    Anesthesia Review: HTN; bilateral carotid stenosis , asymptomatic;  COPD GOLD II; pre-diabetes;  CKD 3  Pt denies cardiac symptoms, no peripheral swelling, but is having increased DOE from his base line in past couple of days (refer to my note today @1028 .  Pt did call Dr Melvyn Novas office and he started pt on prednisone today told pt if not feel better cancel his procedure. Reviewed chart w/ anesthesia, Dr Therisa Doyne MDA, stated have pt take his prednisone morning of surgery he will be accessed dos. Pt told his decision to come in dos to be assessed by anesthesia or reschedule surgery. Pt decided to proceed and be assessed dos.  Advised pt if starts having difficulty breathing / can't get his breath to do to call Dr Melvyn Novas office or go to ED.  PCP: Dr Mitchel Honour ([place] 10-23-2021 epic) Cardiologist : Dr Alfonso Patten. Agustin Cree ([place] 06-23-2021  epic) Chest x-ray : 11-19-2020;  CT 10-14-2020 epic EKG : 06-23-2021 epic Echo : 07-13-2021 epic Stress test: nuclear 07-13-2021 epic Cardiac Cath :  no Activity level:  see above Sleep Study/ CPAP : No  Blood Thinner/ Instructions Maryjane Hurter Dose: no ASA / Instructions/ Last Dose :  ASA 81mg :  pt stated last taken last week, because he ran out.

## 2021-10-27 NOTE — Progress Notes (Signed)
Called pt today to do pre-op interview for surgery scheduled for Monday 10-30-2021 by Dr Dema Severin. Pt stated he had a question before getting started. Pt stated he started having issues with his breathing couple days ago.  Pt stated he has been using his symbicort twice daily as prescribed , used his rescue inhaler a couple of times, but has not used his nebulizer.  Pt stated he did see his pcp 10-23-2021 note in epic, he had no issues then.  Pt stated is ok when he sits down but when he gets up and moving he is "having problems breathing". Pt wanted to know if this be problem with having surgery, I told him possibly yes since he has COPD GOLD II.  Advised pt to call his pulmonologist, Dr Melvyn Novas, office and asked to speak with triage nurse , let them know your symptoms and that you are scheduled for elective surgery on Monday with general anesthesia.  Pt verbalized understanding and he will call back later , let me know his decision.

## 2021-10-28 ENCOUNTER — Ambulatory Visit: Payer: Self-pay | Admitting: Surgery

## 2021-10-28 NOTE — H&P (Signed)
CC: referred for anal canal polypoid lesion  HPI: Mr. Gosch is a very pleasant 70yoM with hx of HTN, HLD, COPD (not requiring home O2) who is referred to [place] for evaluation after his first colonoscopy.  He underwent a screening colonoscopy at Dr. Ardis Hughs 03/28/21 that demonstrated a small polyp removed from the colon that returned as a tubular adenoma as well as another polyp in the anal canal that set on top of a internal hemorrhoid.  This was left in situ given that it set on a blood vessel.  He was subsequently referred to see [place].  He denies any complaints today.  He denies any history of anorectal pain, bleeding, tissue prolapse, or any prior anorectal procedures.  He is currently undergoing workup for possible prostate cancer with [facility].  He states he has been offered 2 separate treatments including surgery or radiation.  He is meeting with each respective specialist to discuss which option he would go with.  PMH: HTN, HLD, COPD  PSH: He denies any prior anorectal procedures/surgeries or abdominal surgeries  FHx: Denies FHx of colorectal, breast, endometrial, ovarian or cervical cancer  Social: Reports he smokes 2-3 cigarettes every other day. Rare social EtOH use.  He is currently working at the [employer] but is retired.  He previously worked as a Administrator.    ROS: A comprehensive 10 system review of systems was completed with the patient and pertinent findings as noted above.  The patient is a 70 year old male.   Past Surgical History Flint Melter, CMA; 05/08/2021 1:40 PM) Colon Polyp Removal - Colonoscopy    Diagnostic Studies History (Raven Sneed, CMA; 05/08/2021 1:40 PM) Colonoscopy   within last year  Allergies (Raven Sneed, CMA; 05/08/2021 1:40 PM) No Known Drug Allergies   [05/08/2021]: Allergies Reconciled    Social History (Raven Sneed, CMA; 05/08/2021 1:40 PM) Alcohol use   Occasional alcohol use. Caffeine use   Carbonated beverages, Coffee,  Tea. Illicit drug use   Uses socially only. Tobacco use   Current some day smoker.  Family History Flint Melter, CMA; 05/08/2021 1:40 PM) Colon Polyps   Brother, Mother, Sister. Hypertension   Brother, Mother. Ischemic Bowel Disease   Mother.  Other Problems (Raven Sneed, CMA; 05/08/2021 1:40 PM) Chronic Obstructive Lung Disease   Enlarged Prostate   Gastroesophageal Reflux Disease   High blood pressure   Prostate Cancer      Review of Systems Harrell Gave M. Hance Caspers MD; 05/08/2021 1:59 PM) General Not Present- Appetite Loss, Chills, Fatigue, Fever, Night Sweats, Weight Gain and Weight Loss. Skin Not Present- Change in Wart/Mole, Dryness, Hives, Jaundice, New Lesions, Non-Healing Wounds, Rash and Ulcer. HEENT Present- Seasonal Allergies. Not Present- Earache, Hearing Loss, Hoarseness, Nose Bleed, Oral Ulcers, Ringing in the Ears, Sinus Pain, Sore Throat, Visual Disturbances, Wears glasses/contact lenses and Yellow Eyes. Respiratory Present- Wheezing. Not Present- Bloody sputum, Chronic Cough, Difficulty Breathing and Snoring. Breast Not Present- Breast Mass, Breast Pain, Nipple Discharge and Skin Changes. Cardiovascular Present- Leg Cramps. Not Present- Chest Pain, Difficulty Breathing Lying Down, Palpitations, Rapid Heart Rate, Shortness of Breath and Swelling of Extremities. Gastrointestinal Not Present- Abdominal Pain, Bloating, Bloody Stool, Change in Bowel Habits, Chronic diarrhea, Constipation, Difficulty Swallowing, Excessive gas, Gets full quickly at meals, Hemorrhoids, Indigestion, Nausea, Rectal Pain and Vomiting. Male Genitourinary Present- Blood in Urine, Frequency and Nocturia. Not Present- Change in Urinary Stream, Impotence, Painful Urination, Urgency and Urine Leakage. Psychiatric Not Present- Anxiety and Depression. Endocrine Not Present- Appetite Changes  and Cold Intolerance. Hematology Not Present- Blood Clots and Blood Thinners.  Vitals (Raven Sneed CMA; 05/08/2021 1:40  PM) 05/08/2021 1:40 PM Weight: 198.5 lb   Height: 73 in  Body Surface Area: 2.14 m   Body Mass Index: 26.19 kg/m   Temp.: 98.2 F    Pulse: 86 (Regular)    P.OX: 96% (Room air)       Physical Exam Harrell Gave M. Shalon Salado MD; 05/08/2021 2:04 PM) The physical exam findings are as follows: Note:  Constitutional: No acute distress; conversant; no deformities Eyes: Moist conjunctiva; no lid lag; anicteric sclerae; pupils equal round and reactive to light Neck: Trachea midline; no palpable thyromegaly Lungs: Normal respiratory effort; no tactile fremitus CV: Regular rate and rhythm; no palpable thrill; no pitting edema GI: Abdomen soft, nontender, nondistended; no palpable hepatosplenomegaly Anorectal: Normal perianal skin. No palpable masses on DRE, normal tone and squeeze Anoscopy: Circumferential anoscopy somewhat limited and extension due to toleration but there is a suggestion of a soft rubbery-appearing I'll avoid nodule on one of his internal hemorrhoids.  No fissures.  No granulation tissue.  No ulceration. MSK: Normal gait; no clubbing/cyanosis Psychiatric: Appropriate affect; alert and oriented 3 Lymphatic: No palpable cervical or axillary lymphadenopathy **A chaperone, Altamese Cabal, was present for this encounter    Assessment & Plan Harrell Gave M. Marinda Tyer MD; 05/08/2021 2:04 PM) ANAL POLYP (K62.0) Story: Mr. Cretella is a very pleasant 70yoM with hx HTN, HLD, COPD, now newly diagnosed prostate ca here for evaluation of polypoid lesion seen on screening colonoscopy overlying an internal hemorrhoid (on retroflexion) Impression: -The anatomy and physiology of the anal canal was discussed with the patient. The pathophysiology of anal canal polyps was discussed with associated pictures and illustrations. -Would plan trans-anal excision of anal canal polypoid lesion; anorectal exam under anesthesia to further characterize and/or treat this lesion. -The planned procedure, material risks  (including, but not limited to, pain, bleeding, infection, scarring, need for blood transfusion, damage to anal sphincter, incontinence of gas and/or stool, need for additional procedures, recurrence, pneumonia, heart attack, stroke, death) benefits and alternatives to surgery were discussed at length. The patient's questions were answered to his satisfaction, he voiced understanding and elected to proceed with surgery. Additionally, we discussed typical postoperative expectations and the recovery process.  This patient encounter took 35 minutes today to perform the following: take history, perform exam, review outside records, interpret imaging, counsel the patient on their diagnosis and document encounter, findings & plan in the EHR Current Plans ANOSCOPY, DIAGNOSTIC [phone]) (Anoscopy: Circumferential anoscopy somewhat limited and extension due to toleration but there is a suggestion of a soft rubbery-appearing I'll avoid nodule on one of his internal hemorrhoids.  No fissures.  No granulation tissue.  No ulcera) Signed by Ileana Roup, MD (05/08/2021 2:08 PM)

## 2021-10-30 ENCOUNTER — Ambulatory Visit (HOSPITAL_BASED_OUTPATIENT_CLINIC_OR_DEPARTMENT_OTHER): Payer: Medicare HMO | Admitting: Anesthesiology

## 2021-10-30 ENCOUNTER — Ambulatory Visit (HOSPITAL_BASED_OUTPATIENT_CLINIC_OR_DEPARTMENT_OTHER)
Admission: RE | Admit: 2021-10-30 | Discharge: 2021-10-30 | Disposition: A | Discharge status: Home / Self Care | Payer: Medicare HMO | Attending: Surgery | Admitting: Surgery

## 2021-10-30 ENCOUNTER — Encounter (HOSPITAL_BASED_OUTPATIENT_CLINIC_OR_DEPARTMENT_OTHER): Payer: Self-pay | Admitting: Surgery

## 2021-10-30 ENCOUNTER — Encounter (HOSPITAL_BASED_OUTPATIENT_CLINIC_OR_DEPARTMENT_OTHER)
Admission: RE | Disposition: A | Discharge status: Home / Self Care | Payer: Self-pay | Source: Home / Self Care | Attending: Surgery

## 2021-10-30 ENCOUNTER — Telehealth: Payer: Self-pay | Admitting: *Deleted

## 2021-10-30 DIAGNOSIS — K648 Other hemorrhoids: Secondary | ICD-10-CM | POA: Insufficient documentation

## 2021-10-30 DIAGNOSIS — K219 Gastro-esophageal reflux disease without esophagitis: Secondary | ICD-10-CM | POA: Diagnosis not present

## 2021-10-30 DIAGNOSIS — J449 Chronic obstructive pulmonary disease, unspecified: Secondary | ICD-10-CM | POA: Diagnosis not present

## 2021-10-30 DIAGNOSIS — G709 Myoneural disorder, unspecified: Secondary | ICD-10-CM | POA: Insufficient documentation

## 2021-10-30 DIAGNOSIS — I10 Essential (primary) hypertension: Secondary | ICD-10-CM | POA: Diagnosis not present

## 2021-10-30 DIAGNOSIS — I739 Peripheral vascular disease, unspecified: Secondary | ICD-10-CM | POA: Diagnosis not present

## 2021-10-30 DIAGNOSIS — E785 Hyperlipidemia, unspecified: Secondary | ICD-10-CM | POA: Insufficient documentation

## 2021-10-30 DIAGNOSIS — K620 Anal polyp: Secondary | ICD-10-CM | POA: Insufficient documentation

## 2021-10-30 DIAGNOSIS — Z8546 Personal history of malignant neoplasm of prostate: Secondary | ICD-10-CM | POA: Insufficient documentation

## 2021-10-30 DIAGNOSIS — I2510 Atherosclerotic heart disease of native coronary artery without angina pectoris: Secondary | ICD-10-CM | POA: Diagnosis not present

## 2021-10-30 DIAGNOSIS — I700 Atherosclerosis of aorta: Secondary | ICD-10-CM | POA: Diagnosis not present

## 2021-10-30 DIAGNOSIS — K644 Residual hemorrhoidal skin tags: Secondary | ICD-10-CM | POA: Diagnosis not present

## 2021-10-30 DIAGNOSIS — F17210 Nicotine dependence, cigarettes, uncomplicated: Secondary | ICD-10-CM | POA: Insufficient documentation

## 2021-10-30 HISTORY — DX: Benign prostatic hyperplasia with lower urinary tract symptoms: N40.1

## 2021-10-30 HISTORY — DX: Allergic rhinitis, unspecified: J30.9

## 2021-10-30 HISTORY — DX: Chronic kidney disease, stage 3 unspecified: N18.30

## 2021-10-30 HISTORY — PX: TRANSANAL EXCISION OF RECTAL MASS: SHX6134

## 2021-10-30 HISTORY — DX: Chronic obstructive pulmonary disease, unspecified: J44.9

## 2021-10-30 HISTORY — DX: Prediabetes: R73.03

## 2021-10-30 HISTORY — DX: Disease of anus and rectum, unspecified: K62.9

## 2021-10-30 HISTORY — DX: Atherosclerosis of aorta: I70.0

## 2021-10-30 HISTORY — DX: Gastro-esophageal reflux disease without esophagitis: K21.9

## 2021-10-30 HISTORY — DX: Male erectile dysfunction, unspecified: N52.9

## 2021-10-30 HISTORY — DX: Other forms of dyspnea: R06.09

## 2021-10-30 HISTORY — DX: Peripheral vascular disease, unspecified: I73.9

## 2021-10-30 HISTORY — DX: Cyst of kidney, acquired: N28.1

## 2021-10-30 HISTORY — DX: Presence of dental prosthetic device (complete) (partial): Z97.2

## 2021-10-30 HISTORY — DX: Personal history of adenomatous and serrated colon polyps: Z86.0101

## 2021-10-30 HISTORY — DX: Presence of spectacles and contact lenses: Z97.3

## 2021-10-30 HISTORY — DX: Personal history of colonic polyps: Z86.010

## 2021-10-30 LAB — POCT I-STAT, CHEM 8
BUN: 22 mg/dL (ref 8–23)
Calcium, Ion: 1.21 mmol/L (ref 1.15–1.40)
Chloride: 105 mmol/L (ref 98–111)
Creatinine, Ser: 1.5 mg/dL — ABNORMAL HIGH (ref 0.61–1.24)
Glucose, Bld: 92 mg/dL (ref 70–99)
HCT: 41 % (ref 39.0–52.0)
Hemoglobin: 13.9 g/dL (ref 13.0–17.0)
Potassium: 3.6 mmol/L (ref 3.5–5.1)
Sodium: 142 mmol/L (ref 135–145)
TCO2: 27 mmol/L (ref 22–32)

## 2021-10-30 SURGERY — EXCISION, MASS, RECTUM, ANAL APPROACH
Anesthesia: General

## 2021-10-30 MED ORDER — ACETAMINOPHEN 500 MG PO TABS
ORAL_TABLET | ORAL | Status: AC
  Filled 2021-10-30: qty 2

## 2021-10-30 MED ORDER — PROPOFOL 10 MG/ML IV BOLUS
INTRAVENOUS | Status: DC | PRN
  Administered 2021-10-30: 150 mg via INTRAVENOUS

## 2021-10-30 MED ORDER — LIDOCAINE 2% (20 MG/ML) 5 ML SYRINGE
INTRAMUSCULAR | Status: DC | PRN
  Administered 2021-10-30: 90 mg via INTRAVENOUS

## 2021-10-30 MED ORDER — ONDANSETRON HCL 4 MG/2ML IJ SOLN
INTRAMUSCULAR | Status: DC | PRN
  Administered 2021-10-30: 4 mg via INTRAVENOUS

## 2021-10-30 MED ORDER — GLYCOPYRROLATE PF 0.2 MG/ML IJ SOSY
PREFILLED_SYRINGE | INTRAMUSCULAR | Status: AC
  Filled 2021-10-30: qty 1

## 2021-10-30 MED ORDER — EPHEDRINE SULFATE-NACL 50-0.9 MG/10ML-% IV SOSY
PREFILLED_SYRINGE | INTRAVENOUS | Status: DC | PRN
  Administered 2021-10-30 (×2): 10 mg via INTRAVENOUS

## 2021-10-30 MED ORDER — DEXAMETHASONE SODIUM PHOSPHATE 10 MG/ML IJ SOLN
INTRAMUSCULAR | Status: DC | PRN
  Administered 2021-10-30: 10 mg via INTRAVENOUS

## 2021-10-30 MED ORDER — ACETAMINOPHEN 500 MG PO TABS
1000.0000 mg | ORAL_TABLET | ORAL | Status: AC
  Administered 2021-10-30: 09:00:00 1000 mg via ORAL

## 2021-10-30 MED ORDER — DIBUCAINE (PERIANAL) 1 % EX OINT
TOPICAL_OINTMENT | CUTANEOUS | Status: DC | PRN
  Administered 2021-10-30: 1 via RECTAL

## 2021-10-30 MED ORDER — FENTANYL CITRATE (PF) 100 MCG/2ML IJ SOLN
INTRAMUSCULAR | Status: AC
  Filled 2021-10-30: qty 2

## 2021-10-30 MED ORDER — FENTANYL CITRATE (PF) 100 MCG/2ML IJ SOLN
INTRAMUSCULAR | Status: DC | PRN
  Administered 2021-10-30: 100 ug via INTRAVENOUS

## 2021-10-30 MED ORDER — FENTANYL CITRATE (PF) 100 MCG/2ML IJ SOLN: 25.0000 ug | INTRAMUSCULAR | Status: DC | PRN

## 2021-10-30 MED ORDER — ONDANSETRON HCL 4 MG/2ML IJ SOLN: 4.0000 mg | Freq: Once | INTRAMUSCULAR | Status: DC | PRN

## 2021-10-30 MED ORDER — SUGAMMADEX SODIUM 200 MG/2ML IV SOLN
INTRAVENOUS | Status: DC | PRN
  Administered 2021-10-30: 200 mg via INTRAVENOUS

## 2021-10-30 MED ORDER — PHENYLEPHRINE 40 MCG/ML (10ML) SYRINGE FOR IV PUSH (FOR BLOOD PRESSURE SUPPORT)
PREFILLED_SYRINGE | INTRAVENOUS | Status: DC | PRN
  Administered 2021-10-30: 80 ug via INTRAVENOUS
  Administered 2021-10-30: 40 ug via INTRAVENOUS
  Administered 2021-10-30: 80 ug via INTRAVENOUS
  Administered 2021-10-30: 40 ug via INTRAVENOUS

## 2021-10-30 MED ORDER — ROCURONIUM BROMIDE 10 MG/ML (PF) SYRINGE
PREFILLED_SYRINGE | INTRAVENOUS | Status: DC | PRN
  Administered 2021-10-30: 40 mg via INTRAVENOUS

## 2021-10-30 MED ORDER — BUPIVACAINE-EPINEPHRINE 0.25% -1:200000 IJ SOLN
INTRAMUSCULAR | Status: DC | PRN
  Administered 2021-10-30: 12 mL

## 2021-10-30 MED ORDER — FLEET ENEMA 7-19 GM/118ML RE ENEM: 1.0000 | ENEMA | Freq: Once | RECTAL | Status: DC

## 2021-10-30 MED ORDER — EPHEDRINE 5 MG/ML INJ
INTRAVENOUS | Status: AC
  Filled 2021-10-30: qty 5

## 2021-10-30 MED ORDER — BUPIVACAINE LIPOSOME 1.3 % IJ SUSP: 20.0000 mL | Freq: Once | INTRAMUSCULAR | Status: DC

## 2021-10-30 MED ORDER — BUPIVACAINE LIPOSOME 1.3 % IJ SUSP
INTRAMUSCULAR | Status: DC | PRN
  Administered 2021-10-30: 8 mL

## 2021-10-30 MED ORDER — GLYCOPYRROLATE PF 0.2 MG/ML IJ SOSY
PREFILLED_SYRINGE | INTRAMUSCULAR | Status: DC | PRN
  Administered 2021-10-30: .2 mg via INTRAVENOUS

## 2021-10-30 MED ORDER — SODIUM CHLORIDE 0.9 % IV SOLN: INTRAVENOUS | Status: DC

## 2021-10-30 MED ORDER — DEXAMETHASONE SODIUM PHOSPHATE 10 MG/ML IJ SOLN
INTRAMUSCULAR | Status: AC
  Filled 2021-10-30: qty 1

## 2021-10-30 MED ORDER — PHENYLEPHRINE 40 MCG/ML (10ML) SYRINGE FOR IV PUSH (FOR BLOOD PRESSURE SUPPORT)
PREFILLED_SYRINGE | INTRAVENOUS | Status: AC
  Filled 2021-10-30: qty 10

## 2021-10-30 SURGICAL SUPPLY — 65 items
APL SKNCLS STERI-STRIP NONHPOA (GAUZE/BANDAGES/DRESSINGS) ×1
BENZOIN TINCTURE PRP APPL 2/3 (GAUZE/BANDAGES/DRESSINGS) ×4 IMPLANT
BLADE EXTENDED COATED 6.5IN (ELECTRODE) IMPLANT
BLADE SURG 10 STRL SS (BLADE) ×2 IMPLANT
BLADE SURG 15 STRL LF DISP TIS (BLADE) ×1 IMPLANT
BLADE SURG 15 STRL SS (BLADE) ×3
COVER BACK TABLE 60X90IN (DRAPES) ×3 IMPLANT
COVER MAYO STAND STRL (DRAPES) ×3 IMPLANT
DECANTER SPIKE VIAL GLASS SM (MISCELLANEOUS) ×1 IMPLANT
DRAPE LAPAROTOMY 100X72 PEDS (DRAPES) ×3 IMPLANT
DRAPE UTILITY XL STRL (DRAPES) ×3 IMPLANT
DRSG PAD ABDOMINAL 8X10 ST (GAUZE/BANDAGES/DRESSINGS) ×3 IMPLANT
GAUZE 4X4 16PLY ~~LOC~~+RFID DBL (SPONGE) ×3 IMPLANT
GAUZE SPONGE 4X4 12PLY STRL (GAUZE/BANDAGES/DRESSINGS) ×3 IMPLANT
GAUZE SPONGE 4X4 12PLY STRL LF (GAUZE/BANDAGES/DRESSINGS) ×2 IMPLANT
GLOVE SRG 8 PF TXTR STRL LF DI (GLOVE) ×1 IMPLANT
GLOVE SURG ENC MOIS LTX SZ7.5 (GLOVE) ×3 IMPLANT
GLOVE SURG UNDER POLY LF SZ8 (GLOVE) ×3
GOWN STRL REUS W/TWL LRG LVL3 (GOWN DISPOSABLE) ×3 IMPLANT
HYDROGEN PEROXIDE 16OZ (MISCELLANEOUS) IMPLANT
IV CATH 14GX2 1/4 (CATHETERS) IMPLANT
IV CATH 18G SAFETY (IV SOLUTION) IMPLANT
KIT SIGMOIDOSCOPE (SET/KITS/TRAYS/PACK) IMPLANT
KIT TURNOVER CYSTO (KITS) ×3 IMPLANT
LOOP VESSEL MAXI BLUE (MISCELLANEOUS) IMPLANT
NDL SAFETY ECLIPSE 18X1.5 (NEEDLE) IMPLANT
NEEDLE HYPO 18GX1.5 SHARP (NEEDLE)
NEEDLE HYPO 22GX1.5 SAFETY (NEEDLE) ×3 IMPLANT
NS IRRIG 500ML POUR BTL (IV SOLUTION) ×3 IMPLANT
PACK BASIN DAY SURGERY FS (CUSTOM PROCEDURE TRAY) ×3 IMPLANT
PANTS MESH DISP LRG (UNDERPADS AND DIAPERS) ×1 IMPLANT
PANTS MESH DISPOSABLE L (UNDERPADS AND DIAPERS) ×2
PENCIL SMOKE EVACUATOR (MISCELLANEOUS) ×3 IMPLANT
RETRACTOR RING URO 16.6X16.6 (MISCELLANEOUS) ×1 IMPLANT
RETRACTOR STAY HOOK 5MM (MISCELLANEOUS) ×1 IMPLANT
SPONGE HEMORRHOID 8X3CM (HEMOSTASIS) IMPLANT
SPONGE SURGIFOAM ABS GEL 12-7 (HEMOSTASIS) IMPLANT
SUCTION FRAZIER HANDLE 10FR (MISCELLANEOUS) ×3
SUCTION TUBE FRAZIER 10FR DISP (MISCELLANEOUS) ×1 IMPLANT
SUT CHROMIC 2 0 SH (SUTURE) IMPLANT
SUT CHROMIC 3 0 SH 27 (SUTURE) IMPLANT
SUT MNCRL AB 4-0 PS2 18 (SUTURE) IMPLANT
SUT SILK 0 PSL NDL (SUTURE) IMPLANT
SUT SILK 0 TIES 10X30 (SUTURE) IMPLANT
SUT SILK 2 0 (SUTURE)
SUT SILK 2-0 18XBRD TIE 12 (SUTURE) IMPLANT
SUT VIC AB 2-0 SH 27 (SUTURE)
SUT VIC AB 2-0 SH 27XBRD (SUTURE) IMPLANT
SUT VIC AB 3-0 SH 18 (SUTURE) IMPLANT
SUT VIC AB 3-0 SH 27 (SUTURE)
SUT VIC AB 3-0 SH 27X BRD (SUTURE) IMPLANT
SUT VIC AB 3-0 SH 27XBRD (SUTURE) IMPLANT
SUT VIC AB 4-0 P-3 18XBRD (SUTURE) IMPLANT
SUT VIC AB 4-0 P3 18 (SUTURE)
SUT VICRYL 0 UR6 27IN ABS (SUTURE) IMPLANT
SUT VICRYL 2 0 18  UND BR (SUTURE)
SUT VICRYL 2 0 18 UND BR (SUTURE) IMPLANT
SYR 20ML LL LF (SYRINGE) IMPLANT
SYR BULB IRRIG 60ML STRL (SYRINGE) ×3 IMPLANT
SYR CONTROL 10ML LL (SYRINGE) ×3 IMPLANT
TOWEL OR 17X26 10 PK STRL BLUE (TOWEL DISPOSABLE) ×3 IMPLANT
TRAY DSU PREP LF (CUSTOM PROCEDURE TRAY) ×3 IMPLANT
TUBE CONNECTING 12'X1/4 (SUCTIONS) ×1
TUBE CONNECTING 12X1/4 (SUCTIONS) ×2 IMPLANT
YANKAUER SUCT BULB TIP NO VENT (SUCTIONS) IMPLANT

## 2021-10-30 NOTE — Anesthesia Postprocedure Evaluation (Signed)
Anesthesia Post Note  Patient: Andrew [name]  Procedure(s) Performed: TRANSANAL EXCISION OF ANAL CANAL POLYPOID LESION     Patient location during evaluation: PACU Anesthesia Type: General Level of consciousness: awake and alert Pain management: pain level controlled Vital Signs Assessment: post-procedure vital signs reviewed and stable Respiratory status: spontaneous breathing, nonlabored ventilation and respiratory function stable Cardiovascular status: blood pressure returned to baseline and stable Postop Assessment: no apparent nausea or vomiting Anesthetic complications: no   No notable events documented.  Last Vitals:  Vitals:   10/30/21 1115 10/30/21 1150  BP: (!) 152/75 (!) 146/62  Pulse: 63 63  Resp: 10 16  Temp:  (!) 36.3 C  SpO2: 94% 97%    Last Pain:  Vitals:   10/30/21 1150  TempSrc:   PainSc: 0-No pain                 Catalina Gravel

## 2021-10-30 NOTE — Transfer of Care (Signed)
Immediate Anesthesia Transfer of Care Note  Patient: Andrew [name]  Procedure(s) Performed: TRANSANAL EXCISION OF ANAL CANAL POLYPOID LESION  Patient Location: PACU  Anesthesia Type:General  Level of Consciousness: awake, alert , oriented and patient cooperative  Airway & Oxygen Therapy: Patient Spontanous Breathing and Patient connected to face mask oxygen  Post-op Assessment: Report given to RN, Post -op Vital signs reviewed and stable and Patient moving all extremities  Post vital signs: Reviewed and stable  Last Vitals:  Vitals Value Taken Time  BP 149/68 10/30/21 1040  Temp    Pulse 75 10/30/21 1043  Resp 18 10/30/21 1043  SpO2 98 % 10/30/21 1043  Vitals shown include unvalidated device data.  Last Pain:  Vitals:   10/30/21 0916  TempSrc: Oral  PainSc: 0-No pain      Patients Stated Pain Goal: 7 (42/68/34 1962)  Complications: No notable events documented.

## 2021-10-30 NOTE — H&P (Signed)
CC: here for surgery  HPI: Andrew [name] is a very pleasant 70yoM with hx of HTN, HLD, COPD (not requiring home O2) who is referred to [place] for evaluation after his first colonoscopy.  He underwent a screening colonoscopy at Dr. Ardis Hughs 03/28/21 that demonstrated a small polyp removed from the colon that returned as a tubular adenoma as well as another polyp in the anal canal that set on top of a internal hemorrhoid.  This was left in situ given that it set on a blood vessel.  He was subsequently referred to see [place].  He denies any complaints today.  He denies any history of anorectal pain, bleeding, tissue prolapse, or any prior anorectal procedures.  He is currently undergoing workup for possible prostate cancer with [facility].  He states he has been offered 2 separate treatments including surgery or radiation.  He is meeting with each respective specialist to discuss which option he would go with.   He denies any changes in his health or health hx since we met. States he is ready for surgery.   PMH: HTN, HLD, COPD   PSH: He denies any prior anorectal procedures/surgeries or abdominal surgeries   FHx: Denies FHx of colorectal, breast, endometrial, ovarian or cervical cancer   Social: Reports he smokes 2-3 cigarettes every other day. Rare social EtOH use.  He is currently working at the [employer] but is retired.  He previously worked as a Administrator.     ROS: A comprehensive 10 system review of systems was completed with the patient and pertinent findings as noted above.  Past Medical History:  Diagnosis Date   Allergic rhinitis    Anal lesion    polypoid   Atherosclerosis of aorta (HCC)    Carotid artery stenosis, asymptomatic, bilateral 11/2017   last carotid ultrasound in epic 07-19-2021  right ECA >50% and left ICA 40-59% stenosis   CKD (chronic kidney disease), stage III ([place])    followed by pcp;  previously followed by nephrologist--- dr Ambrose Pancoast  (per pt lov  appprox 2020)   DOE (dyspnea on exertion)    10-27-2021  pt stated has normal doe due to COPD, but past couple days it has even more so, Dr Melvyn Novas started pt on prednisone today   ED (erectile dysfunction)    GERD (gastroesophageal reflux disease)    History of adenomatous polyp of colon    Hyperplasia of prostate with lower urinary tract symptoms (LUTS)    Hypertension    followed by pcp;   nuclear stress test 07-13-2021 low risk no ischemia w/ nuclear ef 62%;  echo 07-13-2021 ef 55-60%, aortic valve w/ mild AR with mild to moderate sclerosis, RVSP 36.105mHg   [place]    Pre-diabetes    Prostate cancer ([facility] 2018   urologist-- dr mAlyson Ingles  first dx 02/ 2018 Gleason 3+3, PSA 6.4 active survillance;  MRI fusion bx 04-21-2021, Gleason 3+4, PSA 20.5   Renal cyst, acquired    bilateral   Stage 2 moderate COPD by GOLD classification ([facility]    pulmonologist--- dr wMelvyn Novas  emphysema still smoking ;  pt does not have oxygen (10-27-2021  pt stated trying to quit last cigarette in 7 days, had cut done to 3-4 cig per week)   Wears dentures    full upper and partial lower   Wears glasses     Past Surgical History:  Procedure Laterality Date   COLONOSCOPY WITH PROPOFOL  03/28/2021   by  dr Ardis Hughs   NO PAST SURGERIES      Family History  Problem Relation Age of Onset   Colon cancer Neg Hx    Esophageal cancer Neg Hx    Rectal cancer Neg Hx    Stomach cancer Neg Hx    Colon polyps Neg Hx     Social:  reports that he has been smoking cigarettes. He has never used smokeless tobacco. He reports that he does not currently use alcohol. He reports that he does not currently use drugs after having used the following drugs: Marijuana and Cocaine.  Allergies:  Allergies  Allergen Reactions   Lisinopril Cough   Lisinopril-Hydrochlorothiazide     dizziness    Medications: I have reviewed the patient's current medications.  No results found for this or any previous visit  (from the past 48 hour(s)).  No results found.  ROS - all of the below systems have been reviewed with the patient and positives are indicated with bold text General: chills, fever or night sweats Eyes: blurry vision or double vision ENT: epistaxis or sore throat Allergy/Immunology: itchy/watery eyes or nasal congestion Hematologic/Lymphatic: bleeding problems, blood clots or swollen lymph nodes Endocrine: temperature intolerance or unexpected weight changes Breast: new or changing breast lumps or nipple discharge Resp: cough, shortness of breath, or wheezing CV: chest pain or dyspnea on exertion GI: as per HPI GU: dysuria, trouble voiding, or hematuria MSK: joint pain or joint stiffness Neuro: TIA or stroke symptoms Derm: pruritus and skin lesion changes Psych: anxiety and depression  PE Height _0  (1.854 m), weight 93 kg. Constitutional: NAD; conversant; Eyes: Moist conjunctiva; no lid lag; anicteric Lungs: Normal respiratory effort CV: RRR; no palpable thrills; no pitting edema MSK: Normal range of motion of extremities Psychiatric: Appropriate affect; alert and oriented x3  No results found for this or any previous visit (from the past 48 hour(s)).  No results found.   A/P: Andrew [name] is an 70 y.o. male with hx HTN, HLD, COPD, now newly diagnosed prostate ca here for evaluation of polypoid lesion seen on screening colonoscopy overlying an internal hemorrhoid (on retroflexion)  -The anatomy and physiology of the anal canal was discussed with the patient. The pathophysiology of anal canal polyps was discussed with associated pictures and illustrations. -Would plan trans-anal excision of anal canal polypoid lesion; anorectal exam under anesthesia to further characterize and/or treat this lesion. -The planned procedure, material risks (including, but not limited to, pain, bleeding, infection, scarring, need for blood transfusion, damage to anal sphincter, incontinence  of gas and/or stool, need for additional procedures, recurrence, pneumonia, heart attack, stroke, death) benefits and alternatives to surgery were discussed at length. The patient's questions were answered to his satisfaction, he voiced understanding and elected to proceed with surgery. Additionally, we discussed typical postoperative expectations and the recovery process.   Nadeen Landau, MD [facility], [facility]

## 2021-10-30 NOTE — Telephone Encounter (Signed)
CALLED PATIENT TO GIVE INFO., LVM FOR A RETURN CALL 

## 2021-10-30 NOTE — Discharge Instructions (Addendum)
ANORECTAL SURGERY: POST OP INSTRUCTIONS  DIET: Follow a light bland diet the first 24 hours after arrival home, such as soup, liquids, crackers, etc.  Be sure to include lots of fluids daily.  Avoid fast food or heavy meals as your are more likely to get nauseated.  Eat a low fat diet the next few days after surgery.   Some bleeding with bowel movements is expected for the first couple of days but this should stop in between bowel movements  Take your usually prescribed home medications unless otherwise directed.  PAIN CONTROL: It is helpful to take an over-the-counter pain medication regularly for the first few days/weeks.  Choose from the following that works best for you: Ibuprofen (Advil, etc) Three 200mg  tabs every 6 hours as needed. Acetaminophen (Tylenol, etc) 500-650mg  every 6 hours as needed NOTE: You may take both of these medications together - most patients find it most helpful when alternating between the two (i.e. Ibuprofen at 6am, tylenol at 9am, ibuprofen at 12pm ..Marland Kitchen) A  prescription for pain medication may have been prescribed for you at discharge.  Take your pain medication as prescribed.  If you are having problems/concerns with the prescription medicine, please call us for further advice.  Avoid getting constipated.  Between the surgery and the pain medications, it is common to experience some constipation.  Increasing fluid intake (64oz of water per day) and taking a fiber supplement (such as Metamucil, Citrucel, FiberCon) 1-2 times a day regularly will usually help prevent this problem from occurring.  Take Miralax (over the counter) 1-2x/day while taking a narcotic pain medication. If no bowel movement after 48hours, you may additionally take a laxative like a bottle of Milk of Magnesia which can be purchased over the counter. Avoid enemas if possible as these are often painful.   Watch out for diarrhea.  If you have many loose bowel movements, simplify your diet to bland  foods.  Stop any stool softeners and decrease your fiber supplement. If this worsens or does not improve, please call us.  Wash / shower every day.  If you were discharged with a dressing, you may remove this the day after your surgery. You may shower normally, getting soap/water on your wound, particularly after bowel movements.  Soaking in a warm bath filled a couple inches ("Sitz bath") is a great way to clean the area after a bowel movement and many patients find it is a way to soothe the area.  ACTIVITIES as tolerated:   You may resume regular (light) daily activities beginning the next day--such as daily self-care, walking, climbing stairs--gradually increasing activities as tolerated.  If you can walk 30 minutes without difficulty, it is safe to try more intense activity such as jogging, treadmill, bicycling, low-impact aerobics, etc. Refrain from any heavy lifting or straining for the first 2 weeks after your procedure, particularly if your surgery was for hemorrhoids. Avoid activities that make your pain worse You may drive when you are no longer taking prescription pain medication, you can comfortably wear a seatbelt, and you can safely maneuver your car and apply brakes.  FOLLOW UP in our office Please call [facility] at [phone] to set up an appointment to see your surgeon in the office for a follow-up appointment approximately 2 weeks after your surgery. Make sure that you call for this appointment the day you arrive home to insure a convenient appointment time.  9. If you have disability or family leave forms that need to be completed,  you may have them completed by your primary care physician's office; for return to work instructions, please ask our office staff and they will be happy to assist you in obtaining this documentation   When to call us [phone]: Poor pain control Reactions / problems with new medications (rash/itching, etc)  Fever over 101.5 F (38.5 C) Inability  to urinate Nausea/vomiting Worsening swelling or bruising Continued bleeding from incision. Increased pain, redness, or drainage from the incision  The clinic staff is available to answer your questions during regular business hours (8:30am-5pm).  Please dont hesitate to call and ask to speak to one of our nurses for clinical concerns.   A surgeon from [facility] is always on call at the hospitals   If you have a medical emergency, go to the nearest emergency room or call 911.   [facility] [address] [place], [place],   [address] MAIN: [phone] FAX: [phone] [email or web address]  Information for Discharge Teaching: EXPAREL (bupivacaine liposome injectable suspension)   Your surgeon or anesthesiologist gave you EXPAREL(bupivacaine) to help control your pain after surgery.  EXPAREL is a local anesthetic that provides pain relief by numbing the tissue around the surgical site. EXPAREL is designed to release pain medication over time and can control pain for up to 72 hours. Depending on how you respond to EXPAREL, you may require less pain medication during your recovery.  Possible side effects: Temporary loss of sensation or ability to move in the area where bupivacaine was injected. Nausea, vomiting, constipation Rarely, numbness and tingling in your mouth or lips, lightheadedness, or anxiety may occur. Call your doctor right away if you think you may be experiencing any of these sensations, or if you have other questions regarding possible side effects.  Follow all other discharge instructions given to you by your surgeon or nurse. Eat a healthy diet and drink plenty of water or other fluids.  If you return to the hospital for any reason within 96 hours following the administration of EXPAREL, it is important for health care providers to know that you have received this anesthetic. A teal  colored band has been placed on your arm with the date, time and amount of EXPAREL you have received in order to alert and inform your health care providers. Please leave this armband in place for the full 96 hours following administration, and then you may remove the band.  Post Anesthesia Home Care Instructions  Activity: Get plenty of rest for the remainder of the day. A responsible individual must stay with you for 24 hours following the procedure.  For the next 24 hours, DO NOT: -Drive a car -Paediatric nurse -Drink alcoholic beverages -Take any medication unless instructed by your physician -Make any legal decisions or sign important papers.  Meals: Start with liquid foods such as gelatin or soup. Progress to regular foods as tolerated. Avoid greasy, spicy, heavy foods. If nausea and/or vomiting occur, drink only clear liquids until the nausea and/or vomiting subsides. Call your physician if vomiting continues.  Special Instructions/Symptoms: Your throat may feel dry or sore from the anesthesia or the breathing tube placed in your throat during surgery. If this causes discomfort, gargle with warm salt water. The discomfort should disappear within 24 hours.   No acetaminophen/Tylenol until after 3:15 pm today if needed.

## 2021-10-30 NOTE — Op Note (Signed)
10/30/2021  10:33 AM  PATIENT:  Andrew [name]  70 y.o. male  Patient Care Team: Horald Pollen, MD as PCP - General (Internal Medicine) McKenzie, Candee Furbish, MD as Consulting Physician (Urology) Nigel Mormon, MD as Consulting Physician (Cardiology) Tanda Rockers, MD as Consulting Physician (Pulmonary Disease) Ileana Roup, MD as Consulting Physician (General Surgery)  PRE-OPERATIVE DIAGNOSIS:  Anal canal polypoid lesion  POST-OPERATIVE DIAGNOSIS:  Same  PROCEDURE:   Transanal excision of anal canal polypoid lesion - [name] posterior, under anoscopic guidance Excision of [name] lateral internal hemorrhoid Anorectal exam under anesthesia  SURGEON:  Surgeon(s): Ileana Roup, MD  ASSISTANT: OR staff   ANESTHESIA:   local and general  SPECIMEN:  [name] posterior anal canal polypoid lesion  DISPOSITION OF SPECIMEN:  PATHOLOGY  COUNTS:  Sponge, needle, and instrument counts were reported correct x2 at conclusion.  EBL: 5 mL  Drains: None  PLAN OF CARE: Discharge to home after PACU  PATIENT DISPOSITION:  PACU - hemodynamically stable.  OR FINDINGS: [name] posterior anal canal polypoid lesion.  This is somewhat on the pedicle.  Under endoscopic guidance, this was fully excised.  An additional  DESCRIPTION: The patient was identified in the preoperative holding area and taken to the OR. SCDs were applied. He then underwent general endotracheal anesthesia without difficulty. The patient was then rolled onto the OR table in the prone jackknife position. Pressure points were then evaluated and padded. Benzoin was applied to the buttocks and they were gently taped apart.  He was then prepped and draped in usual sterile fashion.  A surgical timeout was performed indicating the correct patient, procedure, and positioning.  A perianal block was then created using a dilute mixture of 0.25% Marcaine with epinephrine and Exparel.  After ascertaining an  appropriate level of anesthesia had been achieved, a well lubricated digital rectal exam was performed. This demonstrated the polypoid type lesion to be in the [name] posterior anal canal and soft/mobile.  A Hill-Ferguson anoscope was into the anal canal and circumferential inspection demonstrated this lesion to be on somewhat of a pedicle.  This is in the [name] posterior position.  There is also prolapsing internal hemorrhoidal tissue within the [name] lateral aspect of the anal canal.    Attention was directed at the polypoid lesion first.  With the anoscope in place, we are able to visualize this and grasped it with a DeBakey forcep.  Margins of excision were marked circumferentially such that there is grossly normal appearing tissue.  This was then excised sharply initially, teased away from the sphincter muscle, and then transected using electrocautery.  Hemostasis was achieved with electrocautery.  The specimen was passed off.  The defect was then closed with a running 3-0 Vicryl suture.  This was inspected and hemostatic  Attention was directed at the [name] lateral hemorrhoidal tissue which does appear to be freely prolapsing.  This was grasped and elevated the DeBakey forcep.  This was initially incised sharply and the hemorrhoidal tissue was dissected free from the sphincter muscle.  This was then ligated using electrocautery.  This was passed off the specimen but included with the initial.  Hemostasis was achieved with electrocautery.  The defect was then closed using a running 3-0 Vicryl suture.  The anal canal was irrigated.  Hemostasis verified.  Additional local anesthetic was infiltrated.  A dressing consisting of 4 x 4's, ABD, mesh underwear was placed.  The buttocks are untaped.  He was rolled back onto a  stretcher, awakened from  anesthesia, extubated, and transported recovery in satisfactory condition.  DISPOSITION: PACU in satisfactory condition.

## 2021-10-30 NOTE — Anesthesia Procedure Notes (Signed)
Procedure Name: Intubation Date/Time: 10/30/2021 9:47 AM Performed by: Myna Bright, CRNA Pre-anesthesia Checklist: Patient identified, Emergency Drugs available, Suction available and Patient being monitored Patient Re-evaluated:Patient Re-evaluated prior to induction Oxygen Delivery Method: Circle system utilized Preoxygenation: Pre-oxygenation with 100% oxygen Induction Type: IV induction Ventilation: Mask ventilation without difficulty Laryngoscope Size: Mac and 4 Grade View: Grade I Tube type: Oral Tube size: 7.5 mm Number of attempts: 1 Airway Equipment and Method: Stylet Placement Confirmation: ETT inserted through vocal cords under direct vision, positive ETCO2 and breath sounds checked- equal and bilateral Secured at: 22 cm Tube secured with: Tape Dental Injury: Teeth and Oropharynx as per pre-operative assessment

## 2021-10-30 NOTE — Anesthesia Preprocedure Evaluation (Addendum)
Anesthesia Evaluation  Patient identified by MRN, date of birth, ID band Patient awake    Reviewed: Allergy & Precautions, NPO status , Patient's Chart, lab work & pertinent test results  Airway Mallampati: II  TM Distance: >3 FB Neck ROM: Full    Dental  (+) Dental Advisory Given, Upper Dentures, Partial Lower   Pulmonary asthma , COPD,  COPD inhaler, Current Smoker and Patient abstained from smoking.,    Pulmonary exam normal breath sounds clear to auscultation       Cardiovascular hypertension, Pt. on medications + CAD, + Peripheral Vascular Disease and + DOE  Normal cardiovascular exam+ Valvular Problems/Murmurs AI  Rhythm:Regular Rate:Normal     Neuro/Psych  Neuromuscular disease negative psych ROS   GI/Hepatic Neg liver ROS, GERD  ,ANAL CANAL POLYP   Endo/Other  negative endocrine ROS  Renal/GU Renal InsufficiencyRenal disease   Prostate cancer     Musculoskeletal negative musculoskeletal ROS (+)   Abdominal   Peds  Hematology negative hematology ROS (+)   Anesthesia Other Findings Day of surgery medications reviewed with the patient.  Reproductive/Obstetrics                            Anesthesia Physical Anesthesia Plan  ASA: 3  Anesthesia Plan: General   Post-op Pain Management: Tylenol PO (pre-op)   Induction: Intravenous  PONV Risk Score and Plan: 2 and Dexamethasone and Ondansetron  Airway Management Planned: Oral ETT  Additional Equipment:   Intra-op Plan:   Post-operative Plan: Extubation in OR  Informed Consent: I have reviewed the patients History and Physical, chart, labs and discussed the procedure including the risks, benefits and alternatives for the proposed anesthesia with the patient or authorized representative who has indicated his/her understanding and acceptance.     Dental advisory given  Plan Discussed with: CRNA  Anesthesia Plan Comments:         Anesthesia Quick Evaluation

## 2021-10-31 ENCOUNTER — Encounter (HOSPITAL_BASED_OUTPATIENT_CLINIC_OR_DEPARTMENT_OTHER): Payer: Self-pay | Admitting: Surgery

## 2021-10-31 LAB — SURGICAL PATHOLOGY

## 2021-11-07 ENCOUNTER — Telehealth: Payer: Self-pay

## 2021-11-07 ENCOUNTER — Telehealth: Payer: Self-pay | Admitting: *Deleted

## 2021-11-07 NOTE — Telephone Encounter (Signed)
Received call from [facility], Dr. Tammi Klippel office, that patient wishes to have Fiducial markers with space OAR and not brachytherapy.  Called patient and left message to return call to office to confirm.

## 2021-11-07 NOTE — Telephone Encounter (Signed)
Confirmed with patient that he does wish to cancel Brachytherapy.  Cancelled with centralized surgery scheduling.  Patient would like to proceed with fiducial markers with Space OAR. Message sent to MD for new posting sheet.

## 2021-11-07 NOTE — Telephone Encounter (Signed)
Called patient to ask questions, spoke with patient 

## 2021-11-08 ENCOUNTER — Telehealth: Payer: Self-pay | Admitting: *Deleted

## 2021-11-08 NOTE — Telephone Encounter (Signed)
CALLED PATIENT TO INFORM THAT I WOULD CALL HIM AS SOON AS THEY HAVE HIS FID. MARKERS AND SPACE OAR ARRANGED AND I WILL ALSO ARRANGE A SIM APPT. FOR HIM, LVM FOR A RETURN CALL

## 2021-11-09 ENCOUNTER — Encounter (HOSPITAL_COMMUNITY): Payer: Medicare HMO

## 2021-11-09 ENCOUNTER — Other Ambulatory Visit: Payer: Self-pay | Admitting: Urology

## 2021-11-09 ENCOUNTER — Ambulatory Visit: Payer: Medicare HMO

## 2021-11-09 ENCOUNTER — Ambulatory Visit: Payer: Self-pay | Admitting: Urology

## 2021-11-09 ENCOUNTER — Telehealth: Payer: Medicare HMO | Admitting: Nurse Practitioner

## 2021-11-09 DIAGNOSIS — Z20822 Contact with and (suspected) exposure to covid-19: Secondary | ICD-10-CM | POA: Diagnosis not present

## 2021-11-09 DIAGNOSIS — C61 Malignant neoplasm of prostate: Secondary | ICD-10-CM

## 2021-11-09 DIAGNOSIS — U071 COVID-19: Secondary | ICD-10-CM | POA: Diagnosis not present

## 2021-11-09 MED ORDER — MOLNUPIRAVIR EUA 200MG CAPSULE: 4.0000 | ORAL_CAPSULE | Freq: Two times a day (BID) | ORAL | 0 refills | Status: AC

## 2021-11-09 NOTE — Progress Notes (Signed)
Virtual Visit Consent   Andrew [name], you are scheduled for a virtual visit with a [place] provider today.     Just as with appointments in the office, your consent must be obtained to participate.  Your consent will be active for this visit and any virtual visit you may have with one of our providers in the next 365 days.     If you have a MyChart account, a copy of this consent can be sent to you electronically.  All virtual visits are billed to your insurance company just like a traditional visit in the office.    As this is a virtual visit, video technology does not allow for your provider to perform a traditional examination.  This may limit your provider's ability to fully assess your condition.  If your provider identifies any concerns that need to be evaluated in person or the need to arrange testing (such as labs, EKG, etc.), we will make arrangements to do so.     Although advances in technology are sophisticated, we cannot ensure that it will always work on either your end or our end.  If the connection with a video visit is poor, the visit may have to be switched to a telephone visit.  With either a video or telephone visit, we are not always able to ensure that we have a secure connection.     I need to obtain your verbal consent now.   Are you willing to proceed with your visit today?    [name] has provided verbal consent on 11/09/2021 for a virtual visit (video or telephone).   Andrew Pounds, NP   Date: 11/09/2021 5:20 PM   Virtual Visit via Video Note   I, Andrew [name], connected with  Andrew [name]  ([id number], [birth year]) on 11/09/21 at  5:45 PM EST by a video-enabled telemedicine application and verified that I am speaking with the correct person using two identifiers.  Location: Patient: Virtual Visit Location Patient: Home Provider: Virtual Visit Location Provider: Home Office   I discussed the limitations of evaluation and management by  telemedicine and the availability of in person appointments. The patient expressed understanding and agreed to proceed.    History of Present Illness: Andrew [name] is a 70 y.o. who identifies as a male who was assigned male at birth, and is being seen today for COVID POSITIVE Test through [facility] resulted today.   HPI:  Tested positive for COVID today. Currently symptoms: rhinorrhea, productive cough. Symptoms onset 3 days ago. Denies N/V, diarrhea, fever, or headache.     Problems:  Patient Active Problem List   Diagnosis Date Noted   Cervical radiculitis 08/23/2021   Preop cardiovascular exam 06/23/2021   Atherosclerosis of aorta ([place]) 04/20/2021   Acquired thrombophilia ([place]) 05/05/2020   Prostate cancer ([place]) 01/14/2018   Carotid artery stenosis, asymptomatic, bilateral 12/06/2017   Vitamin D deficiency 12/03/2017   Prediabetes 11/14/2017   Chronic kidney disease (CKD) stage G3a/A2, moderately decreased glomerular filtration rate (GFR) between 45-59 mL/min/1.73 square meter and albuminuria creatinine ratio between 30-299 mg/g ([place]) 11/13/2017   Cigarette smoker 08/20/2017   COPD GOLD 2 still smoking  07/31/2017   Coronary atherosclerosis of native coronary artery 07/31/2017   Benign prostatic hyperplasia with urinary obstruction 05/30/2016   Allergic rhinitis 06/01/2008   DENTAL CARIES 06/01/2008   GERD 03/04/2008   Hyperlipidemia LDL goal <70 01/30/2008   Essential hypertension 01/30/2008   PERIPHERAL VASCULAR DISEASE 01/30/2008  COCAINE ABUSE 01/30/2008    Allergies:  Allergies  Allergen Reactions   Lisinopril Cough   Lisinopril-Hydrochlorothiazide     dizziness   Medications:  Current Outpatient Medications:    molnupiravir EUA (LAGEVRIO) 200 mg CAPS capsule, Take 4 capsules (800 mg total) by mouth 2 (two) times daily for 5 days., Disp: 40 capsule, Rfl: 0   albuterol (PROVENTIL HFA;VENTOLIN HFA) 108 (90 Base) MCG/ACT inhaler, Inhale 2 puffs into the lungs every 4  (four) hours as needed for wheezing or shortness of breath (cough, shortness of breath or wheezing.)., Disp: 1 Inhaler, Rfl: 11   albuterol (PROVENTIL) (2.5 MG/3ML) 0.083% nebulizer solution, Take 3 mLs (2.5 mg total) by nebulization every 6 (six) hours as needed for wheezing or shortness of breath., Disp: 150 mL, Rfl: 2   alfuzosin (UROXATRAL) 10 MG 24 hr tablet, Take 1 tablet (10 mg total) by mouth daily with breakfast., Disp: 90 tablet, Rfl: 3   aspirin EC 81 MG tablet, Take 81 mg by mouth daily. Swallow whole., Disp: , Rfl:    budesonide-formoterol (SYMBICORT) 160-4.5 MCG/ACT inhaler, Inhale 2 puffs into the lungs 2 (two) times daily., Disp: , Rfl:    cetirizine (ZYRTEC) 10 MG tablet, TAKE 1 TABLET EVERY DAY (Patient taking differently: Take 10 mg by mouth daily. TAKE 1 TABLET EVERY DAY), Disp: 90 tablet, Rfl: 4   diltiazem (DILACOR XR) 240 MG 24 hr capsule, TAKE 1 CAPSULE EVERY DAY (Patient taking differently: Take 240 mg by mouth daily.), Disp: 90 capsule, Rfl: 3   fluticasone (FLONASE) 50 MCG/ACT nasal spray, Place 1 spray into both nostrils daily., Disp: 32 g, Rfl: 3   guaiFENesin (MUCINEX) 600 MG 12 hr tablet, Take 1 tablet (600 mg total) by mouth 2 (two) times daily as needed for cough or to loosen phlegm., Disp: 60 tablet, Rfl: 2   olmesartan (BENICAR) 40 MG tablet, Take 1 tablet (40 mg total) by mouth daily. (Patient taking differently: Take 40 mg by mouth daily.), Disp: 90 tablet, Rfl: 3   rosuvastatin (CRESTOR) 10 MG tablet, Take 1 tablet (10 mg total) by mouth daily., Disp: 90 tablet, Rfl: 3   tadalafil (CIALIS) 20 MG tablet, Take 1 tablet (20 mg total) by mouth daily as needed for erectile dysfunction., Disp: 10 tablet, Rfl: 5   tiZANidine (ZANAFLEX) 2 MG tablet, Take 1 tablet (2 mg total) by mouth every 6 (six) hours as needed for muscle spasms., Disp: 30 tablet, Rfl: 1   traMADol (ULTRAM) 50 MG tablet, TAKE 1 TABLET BY MOUTH EVERY 6 HOURS AS NEEDED, Disp: 30 tablet, Rfl: 0    triamcinolone (KENALOG) 0.1 %, APPLY 1 APPLICATION TOPICALLY 2 (TWO) TIMES DAILY. (Patient taking differently: Apply 1 application topically 2 (two) times daily as needed.), Disp: 80 g, Rfl: 0  Observations/Objective: Patient is well-developed, well-nourished in no acute distress.  Resting comfortably at home.  Head is normocephalic, atraumatic.  No labored breathing.  Speech is clear and coherent with logical content.  Patient is alert and oriented at baseline.    Assessment and Plan: 1. COVID-19 - molnupiravir EUA (LAGEVRIO) 200 mg CAPS capsule; Take 4 capsules (800 mg total) by mouth 2 (two) times daily for 5 days.  Dispense: 40 capsule; Refill: 0   Follow Up Instructions: I discussed the assessment and treatment plan with the patient. The patient was provided an opportunity to ask questions and all were answered. The patient agreed with the plan and demonstrated an understanding of the instructions.  A copy of instructions  were sent to the patient via MyChart unless otherwise noted below.     The patient was advised to call back or seek an in-person evaluation if the symptoms worsen or if the condition fails to improve as anticipated.  Time:  I spent 12 minutes with the patient via telehealth technology discussing the above problems/concerns.    Andrew Pounds, NP

## 2021-11-09 NOTE — Patient Instructions (Signed)
Andrew [name], thank you for joining Gildardo Pounds, NP for today's virtual visit.  While this provider is not your primary care provider (PCP), if your PCP is located in our provider database this encounter information will be shared with them immediately following your visit.  Consent: (Patient) Andrew [name] provided verbal consent for this virtual visit at the beginning of the encounter.  Current Medications:  Current Outpatient Medications:    molnupiravir EUA (LAGEVRIO) 200 mg CAPS capsule, Take 4 capsules (800 mg total) by mouth 2 (two) times daily for 5 days., Disp: 40 capsule, Rfl: 0   albuterol (PROVENTIL HFA;VENTOLIN HFA) 108 (90 Base) MCG/ACT inhaler, Inhale 2 puffs into the lungs every 4 (four) hours as needed for wheezing or shortness of breath (cough, shortness of breath or wheezing.)., Disp: 1 Inhaler, Rfl: 11   albuterol (PROVENTIL) (2.5 MG/3ML) 0.083% nebulizer solution, Take 3 mLs (2.5 mg total) by nebulization every 6 (six) hours as needed for wheezing or shortness of breath., Disp: 150 mL, Rfl: 2   alfuzosin (UROXATRAL) 10 MG 24 hr tablet, Take 1 tablet (10 mg total) by mouth daily with breakfast., Disp: 90 tablet, Rfl: 3   aspirin EC 81 MG tablet, Take 81 mg by mouth daily. Swallow whole., Disp: , Rfl:    budesonide-formoterol (SYMBICORT) 160-4.5 MCG/ACT inhaler, Inhale 2 puffs into the lungs 2 (two) times daily., Disp: , Rfl:    cetirizine (ZYRTEC) 10 MG tablet, TAKE 1 TABLET EVERY DAY (Patient taking differently: Take 10 mg by mouth daily. TAKE 1 TABLET EVERY DAY), Disp: 90 tablet, Rfl: 4   diltiazem (DILACOR XR) 240 MG 24 hr capsule, TAKE 1 CAPSULE EVERY DAY (Patient taking differently: Take 240 mg by mouth daily.), Disp: 90 capsule, Rfl: 3   fluticasone (FLONASE) 50 MCG/ACT nasal spray, Place 1 spray into both nostrils daily., Disp: 32 g, Rfl: 3   guaiFENesin (MUCINEX) 600 MG 12 hr tablet, Take 1 tablet (600 mg total) by mouth 2 (two) times daily as needed for  cough or to loosen phlegm., Disp: 60 tablet, Rfl: 2   olmesartan (BENICAR) 40 MG tablet, Take 1 tablet (40 mg total) by mouth daily. (Patient taking differently: Take 40 mg by mouth daily.), Disp: 90 tablet, Rfl: 3   rosuvastatin (CRESTOR) 10 MG tablet, Take 1 tablet (10 mg total) by mouth daily., Disp: 90 tablet, Rfl: 3   tadalafil (CIALIS) 20 MG tablet, Take 1 tablet (20 mg total) by mouth daily as needed for erectile dysfunction., Disp: 10 tablet, Rfl: 5   tiZANidine (ZANAFLEX) 2 MG tablet, Take 1 tablet (2 mg total) by mouth every 6 (six) hours as needed for muscle spasms., Disp: 30 tablet, Rfl: 1   traMADol (ULTRAM) 50 MG tablet, TAKE 1 TABLET BY MOUTH EVERY 6 HOURS AS NEEDED, Disp: 30 tablet, Rfl: 0   triamcinolone (KENALOG) 0.1 %, APPLY 1 APPLICATION TOPICALLY 2 (TWO) TIMES DAILY. (Patient taking differently: Apply 1 application topically 2 (two) times daily as needed.), Disp: 80 g, Rfl: 0   Medications ordered in this encounter:  Meds ordered this encounter  Medications   molnupiravir EUA (LAGEVRIO) 200 mg CAPS capsule    Sig: Take 4 capsules (800 mg total) by mouth 2 (two) times daily for 5 days.    Dispense:  40 capsule    Refill:  0    Order Specific Question:   Supervising Provider    Answer:   Sabra Heck, BRIAN [[id number]]     *If you need refills on  other medications prior to your next appointment, please contact your pharmacy*  Follow-Up: Call back or seek an in-person evaluation if the symptoms worsen or if the condition fails to improve as anticipated.  Other Instructions Please keep well-hydrated and get plenty of rest. Start a saline nasal rinse to flush out your nasal passages. You can use plain Mucinex to help thin congestion. If you have a humidifier, running in the bedroom at night. I want you to start OTC vitamin D3 1000 units daily, vitamin C 1000 mg daily, and a zinc supplement. Please take prescribed medications as directed.   You have been enrolled in a MyChart  symptom monitoring program. Please answer these questions daily so we can keep track of how you are doing.   If PCR test positive, please see quarantine instructions below. I also want you to message me via MyChart, using the separate message I have sent you. This way you can communicate directly with me.    You were to quarantine for 5 days from onset of your symptoms.  After day 5, if you have had no fever and you are feeling better, you can end quarantine but need to mask for an additional 5 days. After day 5 if you have a fever or are having significant symptoms, please quarantine for full 10 days.   If you note any worsening of symptoms, any significant shortness of breath or any chest pain, please seek ER evaluation ASAP.  Please do not delay care!    If you have been instructed to have an in-person evaluation today at a local Urgent Care facility, please use the link below. It will take you to a list of all of our available [facility], including address, phone number and hours of operation. Please do not delay care.  [facility]  If you or a family member do not have a primary care provider, use the link below to schedule a visit and establish care. When you choose a [facility] primary care physician or advanced practice provider, you gain a long-term partner in health. Find a Primary Care Provider  Learn more about [facility]'s in-office and virtual care options: [facility]

## 2021-11-09 NOTE — Progress Notes (Signed)
Surgical Physician Order Form [facility] [place]  * Scheduling expectation : Next Available  *Length of Case: 30 minutes  *MD Preforming Case: Nicolette Bang, MD  *Assistant Needed: no  *Facility Preference: [facility]  *Clearance needed: no  *Anticoagulation Instructions: Hold all anticoagulants  *Aspirin Instructions: Ok to continue Aspirin  -Admit type: OUTpatient  -Anesthesia: General  -Use Standing Orders:  na  *Diagnosis: Prostate Cancer  *Procedure:  FIDUCIAL MARKERS WITH SPACEOAR      Additional orders: N/A  -Equipment:  BK [place] -VTE Prophylaxis Standing Order SCDs       Other:   -Standing Lab Orders Per Anesthesia    Lab other: None  -Standing Test orders EKG/Chest x-ray per Anesthesia       Test other:   - Medications:  Ancef 2gm IV  -Other orders:  Fleets enema AM  *Post-op visit Date/Instructions:  1-2 week follow up

## 2021-11-10 ENCOUNTER — Telehealth: Payer: Self-pay | Admitting: Urology

## 2021-11-10 NOTE — Telephone Encounter (Signed)
Left pt. A message to return my call to discuss surgery date. Also sent a mychart message. Surgery scheduled for 11/30/21

## 2021-11-14 NOTE — Progress Notes (Signed)
[facility] Posting Form   Surgery Date/Time: Date: 11/30/2021  Surgeon: Dr. Nicolette Bang, MD  Surgery Location: Day Surgery  Inpt ( No  )   Outpt (Yes)   Obs ( No  )   Diagnosis: C61 Prostate Cancer  -CPT: 73736, [phone]  Surgery: Fiducial Markers with SpaceOAR  Stop Anticoagulations: Yes, okay to continue ASA  Cardiac/Medical/Pulmonary Clearance needed: no  *Orders entered into EPIC  Date: 11/14/21   *Case booked in [place]  Date: 11/10/2021  *Notified pt of Surgery: Date: 11/10/2021  *Placed into Prior Authorization Work Fabio Bering Date: 11/14/21   Assistant/laser/rep:No

## 2021-11-16 ENCOUNTER — Telehealth: Payer: Self-pay | Admitting: Emergency Medicine

## 2021-11-16 ENCOUNTER — Telehealth: Payer: Self-pay

## 2021-11-16 NOTE — Telephone Encounter (Signed)
Patient calling in  Patient says he recently started taking new rx in December   Says today he experienced severe cramping in his arm, hands, & legs.. wants to know if this is a possible side effect of this med & also wants to know what he can do to alleviate the cramping pain  Please call [phone]

## 2021-11-16 NOTE — Telephone Encounter (Signed)
Patient called to confirm surgery for January 19th. Patient would like to proceed January 19th for surgery.

## 2021-11-21 NOTE — Telephone Encounter (Signed)
Yes, he can take rosuvastatin every other day instead.

## 2021-11-22 ENCOUNTER — Telehealth: Payer: Self-pay | Admitting: Internal Medicine

## 2021-11-22 NOTE — Telephone Encounter (Signed)
Called and spoke with pt letting him know that we had not received any forms from [facility] yet for his Symbicort. I did provide pt with the correct fax number that he was going to give to Kyle to make sure they sent it to the right place. Nothing further needed.

## 2021-11-22 NOTE — Telephone Encounter (Signed)
Sent pt mychart message about doctor's recommendations.

## 2021-11-23 NOTE — Patient Instructions (Signed)
Your procedure is scheduled on: 11/30/2021  Report to [place] [facility]tr[facility] at   8:30  AM.  Call this number if you have problems the morning of surgery: [phone]   Remember:   Do not Eat or Drink after midnight         No Smoking the morning of surgery  :  Take these medicines the morning of surgery with A SIP OF WATER: Diltiazem, Zyrtec, and omeprazole   Do not wear jewelry, make-up or nail polish.  Do not wear lotions, powders, or perfumes. You may wear deodorant.  Do not shave 48 hours prior to surgery. Men may shave face and neck.  Do not bring valuables to the hospital.  Contacts, dentures or bridgework may not be worn into surgery.  Leave suitcase in the car. After surgery it may be brought to your room.  For patients admitted to the hospital, checkout time is 11:00 AM the day of discharge.   Patients discharged the day of surgery will not be allowed to drive home.    Special Instructions: Shower using CHG night before surgery and shower the day of surgery use CHG.  Use special wash - you have one bottle of CHG for all showers.  You should use approximately 1/2 of the bottle for each shower. How to Use Chlorhexidine for Bathing Chlorhexidine gluconate (CHG) is a germ-killing (antiseptic) solution that is used to clean the skin. It can get rid of the bacteria that normally live on the skin and can keep them away for about 24 hours. To clean your skin with CHG, you may be given: A CHG solution to use in the shower or as part of a sponge bath. A prepackaged cloth that contains CHG. Cleaning your skin with CHG may help lower the risk for infection: While you are staying in the intensive care unit of the hospital. If you have a vascular access, such as a central line, to provide short-term or long-term access to your veins. If you have a catheter to drain urine from your bladder. If you are on a ventilator. A ventilator is a machine that helps you breathe by moving air in and  out of your lungs. After surgery. What are the risks? Risks of using CHG include: A skin reaction. Hearing loss, if CHG gets in your ears and you have a perforated eardrum. Eye injury, if CHG gets in your eyes and is not rinsed out. The CHG product catching fire. Make sure that you avoid smoking and flames after applying CHG to your skin. Do not use CHG: If you have a chlorhexidine allergy or have previously reacted to chlorhexidine. On babies younger than 9 months of age. How to use CHG solution Use CHG only as told by your health care provider, and follow the instructions on the label. Use the full amount of CHG as directed. Usually, this is one bottle. During a shower Follow these steps when using CHG solution during a shower (unless your health care provider gives you different instructions): Start the shower. Use your normal soap and shampoo to wash your face and hair. Turn off the shower or move out of the shower stream. Pour the CHG onto a clean washcloth. Do not use any type of brush or rough-edged sponge. Starting at your neck, lather your body down to your toes. Make sure you follow these instructions: If you will be having surgery, pay special attention to the part of your body where you will be having  surgery. Scrub this area for at least 1 minute. Do not use CHG on your head or face. If the solution gets into your ears or eyes, rinse them well with water. Avoid your genital area. Avoid any areas of skin that have broken skin, cuts, or scrapes. Scrub your back and under your arms. Make sure to wash skin folds. Let the lather sit on your skin for 1-2 minutes or as long as told by your health care provider. Thoroughly rinse your entire body in the shower. Make sure that all body creases and crevices are rinsed well. Dry off with a clean towel. Do not put any substances on your body afterward--such as powder, lotion, or perfume--unless you are told to do so by your health care  provider. Only use lotions that are recommended by the manufacturer. Put on clean clothes or pajamas. If it is the night before your surgery, sleep in clean sheets.  During a sponge bath Follow these steps when using CHG solution during a sponge bath (unless your health care provider gives you different instructions): Use your normal soap and shampoo to wash your face and hair. Pour the CHG onto a clean washcloth. Starting at your neck, lather your body down to your toes. Make sure you follow these instructions: If you will be having surgery, pay special attention to the part of your body where you will be having surgery. Scrub this area for at least 1 minute. Do not use CHG on your head or face. If the solution gets into your ears or eyes, rinse them well with water. Avoid your genital area. Avoid any areas of skin that have broken skin, cuts, or scrapes. Scrub your back and under your arms. Make sure to wash skin folds. Let the lather sit on your skin for 1-2 minutes or as long as told by your health care provider. Using a different clean, wet washcloth, thoroughly rinse your entire body. Make sure that all body creases and crevices are rinsed well. Dry off with a clean towel. Do not put any substances on your body afterward--such as powder, lotion, or perfume--unless you are told to do so by your health care provider. Only use lotions that are recommended by the manufacturer. Put on clean clothes or pajamas. If it is the night before your surgery, sleep in clean sheets. How to use CHG prepackaged cloths Only use CHG cloths as told by your health care provider, and follow the instructions on the label. Use the CHG cloth on clean, dry skin. Do not use the CHG cloth on your head or face unless your health care provider tells you to. When washing with the CHG cloth: Avoid your genital area. Avoid any areas of skin that have broken skin, cuts, or scrapes. Before surgery Follow these steps  when using a CHG cloth to clean before surgery (unless your health care provider gives you different instructions): Using the CHG cloth, vigorously scrub the part of your body where you will be having surgery. Scrub using a back-and-forth motion for 3 minutes. The area on your body should be completely wet with CHG when you are done scrubbing. Do not rinse. Discard the cloth and let the area air-dry. Do not put any substances on the area afterward, such as powder, lotion, or perfume. Put on clean clothes or pajamas. If it is the night before your surgery, sleep in clean sheets.  For general bathing Follow these steps when using CHG cloths for general bathing (unless your health  care provider gives you different instructions). Use a separate CHG cloth for each area of your body. Make sure you wash between any folds of skin and between your fingers and toes. Wash your body in the following order, switching to a new cloth after each step: The front of your neck, shoulders, and chest. Both of your arms, under your arms, and your hands. Your stomach and groin area, avoiding the genitals. Your right leg and foot. Your left leg and foot. The back of your neck, your back, and your buttocks. Do not rinse. Discard the cloth and let the area air-dry. Do not put any substances on your body afterward--such as powder, lotion, or perfume--unless you are told to do so by your health care provider. Only use lotions that are recommended by the manufacturer. Put on clean clothes or pajamas. Contact a health care provider if: Your skin gets irritated after scrubbing. You have questions about using your solution or cloth. You swallow any chlorhexidine. Call your local poison control center (1-(647)739-2163 in the [place] Get help right away if: Your eyes itch badly, or they become very red or swollen. Your skin itches badly and is red or swollen. Your hearing changes. You have trouble seeing. You have swelling or  tingling in your mouth or throat. You have trouble breathing. These symptoms may represent a serious problem that is an emergency. Do not wait to see if the symptoms will go away. Get medical help right away. Call your local emergency services (911 in the [place] Do not drive yourself to the hospital. Summary Chlorhexidine gluconate (CHG) is a germ-killing (antiseptic) solution that is used to clean the skin. Cleaning your skin with CHG may help to lower your risk for infection. You may be given CHG to use for bathing. It may be in a bottle or in a prepackaged cloth to use on your skin. Carefully follow your health care provider's instructions and the instructions on the product label. Do not use CHG if you have a chlorhexidine allergy. Contact your health care provider if your skin gets irritated after scrubbing. This information is not intended to replace advice given to you by your health care provider. Make sure you discuss any questions you have with your health care provider. Document Revised: 01/09/2021 Document Reviewed: 01/09/2021 Elsevier Patient Education  2022 [employer]. Transrectal Ultrasound-Guided Prostate Gold Seed Placement, Care After The following information offers guidance on how to care for yourself after your procedure. Your health care provider may also give you more specific instructions. If you have problems or questions, contact your health care provider. What can I expect after the procedure? After the procedure, it is common to have: Light bleeding from the rectum. Bruising or tenderness in the area behind the scrotum (perineum), if the needle was put into your prostate through this area. Small amounts of blood in your urine. This should only last for a few days. Light brown or red semen. This may last for a couple of weeks. Follow these instructions at home: Medicines  Take over-the-counter and prescription medicines only as told by your health care  provider. If you were prescribed an antibiotic, take it as told by your health care provider. Do not stop taking the antibiotic early, even if you start to feel better. Ask your health care provider if the medicine prescribed to you requires you to avoid driving or using machinery. Eating and drinking Follow instructions from your health care provider about eating or drinking restrictions. Drink enough  fluid to keep your urine pale yellow. Managing pain and swelling If directed, put ice on the affected area. To do this: Put ice in a plastic bag. Place a towel between your skin and the bag. Leave the ice on for 20 minutes, 2-3 times a day. Be sure to remove the ice if your skin turns bright red. If you cannot feel pain, heat, or cold, you have a greater risk of damage to the area. Try not to sit directly on the area behind the scrotum. A soft cushion can help with discomfort. Activity If you were given a sedative during the procedure, it can affect you for several hours. Do not drive or operate machinery until your health care provider says that it is safe. Return to your normal activities as told by your health care provider. Ask your health care provider what activities are safe for you. Follow instructions from your health care provider about when it is safe for you to engage in sexual activity. General instructions Plan to have a responsible adult care for you for the time you are told after you leave the hospital or clinic. Do not take baths, swim, or use a hot tub until your health care provider approves. Ask your health care provider if you may take showers. You may only be allowed to take sponge baths. Keep all follow-up visits. Contact a health care provider if: You have a fever or chills. You have more blood in your urine. You have blood in your urine for more than 2-3 days after the procedure. You have trouble passing urine or having a bowel movement. You have pain or burning when  urinating. You have nausea or you vomit. Get help right away if: You have severe pain that does not get better with medicine. Your urine is bright red. You cannot urinate. You have rectal bleeding that gets worse. You have shortness of breath. Summary After the procedure, you may have blood in your urine and light bleeding from the rectum. Return to your normal activities as told by your health care provider. Ask your health care provider what activities are safe for you. Take over-the-counter and prescription medicines only as told by your health care provider. Contact your health care provider right away if your urine is bright red or you cannot pass urine. This information is not intended to replace advice given to you by your health care provider. Make sure you discuss any questions you have with your health care provider. Document Revised: 01/25/2021 Document Reviewed: 01/25/2021 Elsevier Patient Education  [place] Anesthesia, Adult, Care After This sheet gives you information about how to care for yourself after your procedure. Your health care provider may also give you more specific instructions. If you have problems or questions, contact your health care provider. What can I expect after the procedure? After the procedure, the following side effects are common: Pain or discomfort at the IV site. Nausea. Vomiting. Sore throat. Trouble concentrating. Feeling cold or chills. Feeling weak or tired. Sleepiness and fatigue. Soreness and body aches. These side effects can affect parts of the body that were not involved in surgery. Follow these instructions at home: For the time period you were told by your health care provider:  Rest. Do not participate in activities where you could fall or become injured. Do not drive or use machinery. Do not drink alcohol. Do not take sleeping pills or medicines that cause drowsiness. Do not make important decisions or  sign legal  documents. Do not take care of children on your own. Eating and drinking Follow any instructions from your health care provider about eating or drinking restrictions. When you feel hungry, start by eating small amounts of foods that are soft and easy to digest (bland), such as toast. Gradually return to your regular diet. Drink enough fluid to keep your urine pale yellow. If you vomit, rehydrate by drinking water, juice, or clear broth. General instructions If you have sleep apnea, surgery and certain medicines can increase your risk for breathing problems. Follow instructions from your health care provider about wearing your sleep device: Anytime you are sleeping, including during daytime naps. While taking prescription pain medicines, sleeping medicines, or medicines that make you drowsy. Have a responsible adult stay with you for the time you are told. It is important to have someone help care for you until you are awake and alert. Return to your normal activities as told by your health care provider. Ask your health care provider what activities are safe for you. Take over-the-counter and prescription medicines only as told by your health care provider. If you smoke, do not smoke without supervision. Keep all follow-up visits as told by your health care provider. This is important. Contact a health care provider if: You have nausea or vomiting that does not get better with medicine. You cannot eat or drink without vomiting. You have pain that does not get better with medicine. You are unable to pass urine. You develop a skin rash. You have a fever. You have redness around your IV site that gets worse. Get help right away if: You have difficulty breathing. You have chest pain. You have blood in your urine or stool, or you vomit blood. Summary After the procedure, it is common to have a sore throat or nausea. It is also common to feel tired. Have a responsible adult stay  with you for the time you are told. It is important to have someone help care for you until you are awake and alert. When you feel hungry, start by eating small amounts of foods that are soft and easy to digest (bland), such as toast. Gradually return to your regular diet. Drink enough fluid to keep your urine pale yellow. Return to your normal activities as told by your health care provider. Ask your health care provider what activities are safe for you. This information is not intended to replace advice given to you by your health care provider. Make sure you discuss any questions you have with your health care provider. Document Revised: 07/14/2020 Document Reviewed: 02/11/2020 Elsevier Patient Education  2022 [employer].

## 2021-11-28 ENCOUNTER — Encounter (HOSPITAL_COMMUNITY): Payer: Self-pay

## 2021-11-28 ENCOUNTER — Encounter (HOSPITAL_COMMUNITY)
Admission: RE | Admit: 2021-11-28 | Discharge: 2021-11-28 | Disposition: A | Discharge status: Home / Self Care | Payer: Medicare HMO | Source: Ambulatory Visit | Attending: Urology | Admitting: Urology

## 2021-11-28 ENCOUNTER — Other Ambulatory Visit: Payer: Self-pay

## 2021-11-28 ENCOUNTER — Encounter: Payer: Self-pay | Admitting: Urology

## 2021-11-28 VITALS — BP 129/64 | Temp 97.3°F | Ht 73.0 in | Wt 200.0 lb

## 2021-11-28 DIAGNOSIS — Z01812 Encounter for preprocedural laboratory examination: Secondary | ICD-10-CM | POA: Diagnosis not present

## 2021-11-28 DIAGNOSIS — N1831 Chronic kidney disease, stage 3a: Secondary | ICD-10-CM | POA: Diagnosis not present

## 2021-11-28 LAB — BASIC METABOLIC PANEL
Anion gap: 10 (ref 5–15)
BUN: 20 mg/dL (ref 8–23)
CO2: 24 mmol/L (ref 22–32)
Calcium: 9.4 mg/dL (ref 8.9–10.3)
Chloride: 107 mmol/L (ref 98–111)
Creatinine, Ser: 1.77 mg/dL — ABNORMAL HIGH (ref 0.61–1.24)
GFR, Estimated: 41 mL/min — ABNORMAL LOW (ref >60)
Glucose, Bld: 89 mg/dL (ref 70–99)
Potassium: 4.6 mmol/L (ref 3.5–5.1)
Sodium: 141 mmol/L (ref 135–145)

## 2021-11-30 ENCOUNTER — Ambulatory Visit (HOSPITAL_COMMUNITY): Payer: Medicare HMO | Admitting: Anesthesiology

## 2021-11-30 ENCOUNTER — Ambulatory Visit (HOSPITAL_COMMUNITY): Payer: Medicare HMO

## 2021-11-30 ENCOUNTER — Encounter (HOSPITAL_COMMUNITY)
Admission: RE | Disposition: A | Discharge status: Home / Self Care | Payer: Self-pay | Source: Home / Self Care | Attending: Urology

## 2021-11-30 ENCOUNTER — Other Ambulatory Visit: Payer: Self-pay

## 2021-11-30 ENCOUNTER — Ambulatory Visit (HOSPITAL_COMMUNITY)
Admission: RE | Admit: 2021-11-30 | Discharge: 2021-11-30 | Disposition: A | Discharge status: Home / Self Care | Payer: Medicare HMO | Attending: Urology | Admitting: Urology

## 2021-11-30 ENCOUNTER — Encounter (HOSPITAL_COMMUNITY): Payer: Self-pay | Admitting: Urology

## 2021-11-30 DIAGNOSIS — N400 Enlarged prostate without lower urinary tract symptoms: Secondary | ICD-10-CM | POA: Insufficient documentation

## 2021-11-30 DIAGNOSIS — I351 Nonrheumatic aortic (valve) insufficiency: Secondary | ICD-10-CM | POA: Diagnosis not present

## 2021-11-30 DIAGNOSIS — I739 Peripheral vascular disease, unspecified: Secondary | ICD-10-CM | POA: Insufficient documentation

## 2021-11-30 DIAGNOSIS — I129 Hypertensive chronic kidney disease with stage 1 through stage 4 chronic kidney disease, or unspecified chronic kidney disease: Secondary | ICD-10-CM | POA: Insufficient documentation

## 2021-11-30 DIAGNOSIS — N183 Chronic kidney disease, stage 3 unspecified: Secondary | ICD-10-CM | POA: Diagnosis not present

## 2021-11-30 DIAGNOSIS — Z86010 Personal history of colonic polyps: Secondary | ICD-10-CM | POA: Diagnosis not present

## 2021-11-30 DIAGNOSIS — F17210 Nicotine dependence, cigarettes, uncomplicated: Secondary | ICD-10-CM | POA: Insufficient documentation

## 2021-11-30 DIAGNOSIS — C61 Malignant neoplasm of prostate: Secondary | ICD-10-CM | POA: Insufficient documentation

## 2021-11-30 DIAGNOSIS — K219 Gastro-esophageal reflux disease without esophagitis: Secondary | ICD-10-CM | POA: Insufficient documentation

## 2021-11-30 DIAGNOSIS — J439 Emphysema, unspecified: Secondary | ICD-10-CM | POA: Insufficient documentation

## 2021-11-30 HISTORY — PX: GOLD SEED IMPLANT: SHX6343

## 2021-11-30 HISTORY — PX: SPACE OAR INSTILLATION: SHX6769

## 2021-11-30 SURGERY — INSERTION, RADIATION SOURCE, PROSTATE
Anesthesia: General

## 2021-11-30 SURGERY — INSERTION, GOLD SEEDS
Anesthesia: General | Site: Prostate

## 2021-11-30 MED ORDER — LACTATED RINGERS IV SOLN: INTRAVENOUS | Status: DC

## 2021-11-30 MED ORDER — ONDANSETRON HCL 4 MG/2ML IJ SOLN: 4.0000 mg | Freq: Once | INTRAMUSCULAR | Status: DC | PRN

## 2021-11-30 MED ORDER — FENTANYL CITRATE (PF) 100 MCG/2ML IJ SOLN
INTRAMUSCULAR | Status: AC
  Filled 2021-11-30: qty 2

## 2021-11-30 MED ORDER — FLEET ENEMA 7-19 GM/118ML RE ENEM
1.0000 | ENEMA | RECTAL | Status: DC
  Filled 2021-11-30: qty 1

## 2021-11-30 MED ORDER — 0.9 % SODIUM CHLORIDE (POUR BTL) OPTIME
TOPICAL | Status: DC | PRN
  Administered 2021-11-30: 1000 mL

## 2021-11-30 MED ORDER — DEXAMETHASONE SODIUM PHOSPHATE 4 MG/ML IJ SOLN
INTRAMUSCULAR | Status: DC | PRN
  Administered 2021-11-30: 8 mg via INTRAVENOUS

## 2021-11-30 MED ORDER — CHLORHEXIDINE GLUCONATE 0.12 % MT SOLN: 15.0000 mL | Freq: Once | OROMUCOSAL | Status: DC

## 2021-11-30 MED ORDER — PROPOFOL 10 MG/ML IV BOLUS
INTRAVENOUS | Status: DC | PRN
  Administered 2021-11-30: 100 mg via INTRAVENOUS
  Administered 2021-11-30: 200 mg via INTRAVENOUS

## 2021-11-30 MED ORDER — EPHEDRINE SULFATE (PRESSORS) 50 MG/ML IJ SOLN
INTRAMUSCULAR | Status: DC | PRN
  Administered 2021-11-30: 15 mg via INTRAVENOUS
  Administered 2021-11-30: 10 mg via INTRAVENOUS

## 2021-11-30 MED ORDER — CEFAZOLIN SODIUM-DEXTROSE 2-4 GM/100ML-% IV SOLN
2.0000 g | INTRAVENOUS | Status: AC
  Administered 2021-11-30: 2 g via INTRAVENOUS
  Filled 2021-11-30: qty 100

## 2021-11-30 MED ORDER — LIDOCAINE HCL (CARDIAC) PF 100 MG/5ML IV SOSY
PREFILLED_SYRINGE | INTRAVENOUS | Status: DC | PRN
Start: 2021-11-30 — End: 2021-11-30
  Administered 2021-11-30: 80 mg via INTRATRACHEAL

## 2021-11-30 MED ORDER — ONDANSETRON HCL 4 MG/2ML IJ SOLN
INTRAMUSCULAR | Status: DC | PRN
Start: 2021-11-30 — End: 2021-11-30
  Administered 2021-11-30: 4 mg via INTRAVENOUS

## 2021-11-30 MED ORDER — TRAMADOL HCL 50 MG PO TABS: 50.0000 mg | ORAL_TABLET | Freq: Four times a day (QID) | ORAL | 0 refills | Status: DC | PRN

## 2021-11-30 MED ORDER — FENTANYL CITRATE (PF) 100 MCG/2ML IJ SOLN
INTRAMUSCULAR | Status: DC | PRN
Start: 2021-11-30 — End: 2021-11-30
  Administered 2021-11-30 (×2): 50 ug via INTRAVENOUS

## 2021-11-30 MED ORDER — FENTANYL CITRATE PF 50 MCG/ML IJ SOSY: 25.0000 ug | PREFILLED_SYRINGE | INTRAMUSCULAR | Status: DC | PRN

## 2021-11-30 MED ORDER — PHENYLEPHRINE HCL (PRESSORS) 10 MG/ML IV SOLN
INTRAVENOUS | Status: DC | PRN
  Administered 2021-11-30: 80 ug via INTRAVENOUS

## 2021-11-30 MED ORDER — PROPOFOL 10 MG/ML IV BOLUS
INTRAVENOUS | Status: AC
  Filled 2021-11-30: qty 20

## 2021-11-30 MED ORDER — ORAL CARE MOUTH RINSE: 15.0000 mL | Freq: Once | OROMUCOSAL | Status: DC

## 2021-11-30 SURGICAL SUPPLY — 24 items
COVER BACK TABLE 60X90IN (DRAPES) ×2 IMPLANT
DRAPE LEGGINS SURG 28X43 STRL (DRAPES) ×2 IMPLANT
DRAPE SURG 17X23 STRL (DRAPES) ×2 IMPLANT
DRSG TEGADERM 8X12 (GAUZE/BANDAGES/DRESSINGS) ×2 IMPLANT
GAUZE SPONGE 4X4 12PLY STRL (GAUZE/BANDAGES/DRESSINGS) ×2 IMPLANT
GLOVE SRG 8 PF TXTR STRL LF DI (GLOVE) ×1 IMPLANT
GLOVE SURG POLYISO LF SZ8 (GLOVE) ×2 IMPLANT
GLOVE SURG UNDER POLY LF SZ7 (GLOVE) ×2 IMPLANT
GLOVE SURG UNDER POLY LF SZ7.5 (GLOVE) ×2 IMPLANT
GLOVE SURG UNDER POLY LF SZ8 (GLOVE) ×2
GOWN STRL REUS W/TWL LRG LVL3 (GOWN DISPOSABLE) ×2 IMPLANT
GOWN STRL REUS W/TWL XL LVL3 (GOWN DISPOSABLE) ×2 IMPLANT
IMPL SPACEOAR SYSTEM 10ML (Spacer) ×1 IMPLANT
IMPLANT SPACEOAR SYSTEM 10ML (Spacer) ×2 IMPLANT
KIT TURNOVER CYSTO (KITS) ×2 IMPLANT
MARKER GOLD PRELOAD 1.2X3 (Urological Implant) ×1 IMPLANT
NS IRRIG 1000ML POUR BTL (IV SOLUTION) ×2 IMPLANT
PAD ARMBOARD 7.5X6 YLW CONV (MISCELLANEOUS) ×4 IMPLANT
SEED GOLD PRELOAD 1.2X3 (Urological Implant) ×6 IMPLANT
SURGILUBE 2OZ TUBE FLIPTOP (MISCELLANEOUS) ×2 IMPLANT
SYR CONTROL 10ML LL (SYRINGE) ×2 IMPLANT
TOWEL NATURAL 4PK STERILE (DISPOSABLE) ×2 IMPLANT
UNDERPAD 30X36 HEAVY ABSORB (UNDERPADS AND DIAPERS) ×2 IMPLANT
WATER STERILE IRR 500ML POUR (IV SOLUTION) ×2 IMPLANT

## 2021-11-30 NOTE — Transfer of Care (Signed)
Immediate Anesthesia Transfer of Care Note  Patient: Andrew [name]  Procedure(s) Performed: GOLD SEED IMPLANT (Prostate) SPACE OAR INSTILLATION (Prostate)  Patient Location: PACU  Anesthesia Type:General  Level of Consciousness: sedated  Airway & Oxygen Therapy: Patient Spontanous Breathing  Post-op Assessment: Report given to RN and Post -op Vital signs reviewed and stable  Post vital signs: Reviewed and stable  Last Vitals:  Vitals Value Taken Time  BP    Temp    Pulse    Resp    SpO2      Last Pain: There were no vitals filed for this visit.       Complications: No notable events documented.

## 2021-11-30 NOTE — Op Note (Signed)
PRE-OPERATIVE DIAGNOSIS:  Adenocarcinoma of the prostate  POST-OPERATIVE DIAGNOSIS:  Same  PROCEDURE: 1. Prostate Ultrasound 2. Placement of fiducial marks 3. Placement of SpaceOAR  SURGEON:  Surgeon(s): Nicolette Bang, MD  ANESTHESIA:  General  EBL:  Minimal  DRAINS: none  FINDINGS: 49.9cc prostate on [place]. No hypoechoic lesions visualized in the prostate  INDICATION: Andrew [name] is a 71 year old with a history of T1c prostate cancer who is scheduled to undergo IMRT. He wishes to have fiducial markers and SpaceOAR placed prior to IMRT to decrease rectal toxicity.  Description of procedure: After informed consent the patient was brought to the major OR, placed on the table and administered general anesthesia. He was then moved to the modified lithotomy position with his perineum perpendicular to the floor. His perineum and genitalia were then sterilely prepped. An official timeout was then performed. The transrectal ultrasound probe was placed in the rectum and affixed to the stand. He was then sterilely draped.  A transrectal ultrasound of the prostate was performed.  Lidocaine  was not  instilled using ultrasound guidance into the junction of each seminal vesicle of the prostate.  3 Gold markers were placed into the prostate using the standard template and ultrasound guidance.  Accurate placement of the markers was confirmed.  We then proceeded to mix the SpaceOAR using the kit supplied from the manufacturer. Once this was complete we placed a sinal needle into the perirectal fat between the rectum and the prostate. Once this was accomplished we injected 2cc of normal saline to hydrodissect the plain. We then instilled the the SpaceOAR through the spinal needle and noted good distribution in the perirectal fat.   The patient was awakened and taken to recovery room in stable and satisfactory condition. He tolerated procedure well and there were no intraoperative  complications.  CONDITION: Stable, extubated, transferred to PACU  PLAN: The patient is to be discharged home and he will start IMRT in the next 2-3 weeks

## 2021-11-30 NOTE — H&P (Signed)
Urology Admission H&P  Chief Complaint: prostate cancer  History of Present Illness: Andrew [name] is a 71yo here for fiducial markers with [employer] for prostate cancer. No significant LUTS. No other complaints today  Past Medical History:  Diagnosis Date   Allergic rhinitis    Anal lesion    polypoid   Atherosclerosis of aorta (HCC)    Carotid artery stenosis, asymptomatic, bilateral 11/2017   last carotid ultrasound in epic 07-19-2021  right ECA >50% and left ICA 40-59% stenosis   CKD (chronic kidney disease), stage III ([place])    followed by pcp;  previously followed by nephrologist--- dr Ambrose Pancoast  (per pt lov appprox 2020)   DOE (dyspnea on exertion)    10-27-2021  pt stated has normal doe due to COPD, but past couple days it has even more so, Dr Melvyn Novas started pt on prednisone today   ED (erectile dysfunction)    GERD (gastroesophageal reflux disease)    History of adenomatous polyp of colon    Hyperplasia of prostate with lower urinary tract symptoms (LUTS)    Hypertension    followed by pcp;   nuclear stress test 07-13-2021 low risk no ischemia w/ nuclear ef 62%;  echo 07-13-2021 ef 55-60%, aortic valve w/ mild AR with mild to moderate sclerosis, RVSP 36.21mmHg   Peripheral vascular disease ([place])    Pre-diabetes    Prostate cancer [facility]) 2018   urologist-- dr Alyson Ingles;  first dx 02/ 2018 Gleason 3+3, PSA 6.4 active survillance;  MRI fusion bx 04-21-2021, Gleason 3+4, PSA 20.5   Renal cyst, acquired    bilateral   Stage 2 moderate COPD by GOLD classification [facility])    pulmonologist--- dr Melvyn Novas;  emphysema still smoking ;  pt does not have oxygen (10-27-2021  pt stated trying to quit last cigarette in 7 days, had cut done to 3-4 cig per week)   Wears dentures    full upper and partial lower   Wears glasses    Past Surgical History:  Procedure Laterality Date   COLONOSCOPY WITH PROPOFOL  03/28/2021   by dr Ardis Hughs   NO PAST SURGERIES     TRANSANAL EXCISION OF RECTAL MASS N/A  10/30/2021   Procedure: TRANSANAL EXCISION OF ANAL CANAL POLYPOID LESION;  Surgeon: Ileana Roup, MD;  Location: [facility];  Service: General;  Laterality: N/A;    Home Medications:  Current Facility-Administered Medications  Medication Dose Route Frequency Provider Last Rate Last Admin   ceFAZolin (ANCEF) IVPB 2g/100 mL premix  2 g Intravenous 30 min Pre-Op Deborra Phegley, Candee Furbish, MD       chlorhexidine (PERIDEX) 0.12 % solution 15 mL  15 mL Mouth/Throat Once Pugh, Ellyn Hack, MD       Or   MEDLINE mouth rinse  15 mL Mouth Rinse Once Pugh, Ellyn Hack, MD       lactated ringers infusion   Intravenous Continuous Pugh, Ellyn Hack, MD       sodium phosphate (FLEET) 7-19 GM/118ML enema 1 enema  1 enema Rectal 120 min pre-op Vincen Bejar, Candee Furbish, MD       Allergies:  Allergies  Allergen Reactions   Lisinopril Cough   Lisinopril-Hydrochlorothiazide     dizziness    Family History  Problem Relation Age of Onset   Colon cancer Neg Hx    Esophageal cancer Neg Hx    Rectal cancer Neg Hx    Stomach cancer Neg Hx    Colon polyps Neg Hx  Social History:  reports that he has been smoking cigarettes. He has never used smokeless tobacco. He reports that he does not currently use alcohol. He reports that he does not currently use drugs after having used the following drugs: Marijuana and Cocaine.  Review of Systems  All other systems reviewed and are negative.  Physical Exam:  Vital signs in last 24 hours: Temp:  [97.8 F (36.6 C)] 97.8 F (36.6 C) (01/19 0936) Pulse Rate:  [63] 63 (01/19 0936) Resp:  [14] 14 (01/19 0936) BP: (155)/(70) 155/70 (01/19 0936) SpO2:  [100 %] 100 % (01/19 0936) Physical Exam Vitals reviewed.  Constitutional:      Appearance: Normal appearance.  HENT:     Head: Normocephalic and atraumatic.     Mouth/Throat:     Mouth: Mucous membranes are dry.  Eyes:     Extraocular Movements: Extraocular movements intact.     Pupils: Pupils are  equal, round, and reactive to light.  Cardiovascular:     Rate and Rhythm: Normal rate and regular rhythm.  Pulmonary:     Effort: Pulmonary effort is normal. No respiratory distress.  Abdominal:     General: Abdomen is flat. There is no distension.  Musculoskeletal:        General: No swelling. Normal range of motion.     Cervical back: Neck supple.  Skin:    General: Skin is warm and dry.  Neurological:     General: No focal deficit present.     Mental Status: He is alert and oriented to person, place, and time.  Psychiatric:        Mood and Affect: Mood normal.        Behavior: Behavior normal.        Thought Content: Thought content normal.        Judgment: Judgment normal.    Laboratory Data:  No results found for this or any previous visit (from the past 24 hour(s)). No results found for this or any previous visit (from the past 240 hour(s)). Creatinine: Recent Labs    11/28/21 1200  CREATININE 1.77*   Baseline Creatinine: 1.77  Impression/Assessment:  70yo with prostate cancer  Plan:  I discussed the natural history of intermediate risk prostate cancer with the patient and the various treatment options including active surveillance, RALP, IMRT, brachytherapy, cryotherapy, HIFU and ADT. After discussing the options the patient elects for fiducial markers with [employer]. Risks/benefits/alternatives discussed.   Nicolette Bang 11/30/2021, 10:26 AM

## 2021-11-30 NOTE — Anesthesia Procedure Notes (Signed)
Procedure Name: LMA Insertion Date/Time: 11/30/2021 10:48 AM Performed by: Minerva Ends, CRNA Pre-anesthesia Checklist: Patient identified, Emergency Drugs available, Suction available and Patient being monitored Patient Re-evaluated:Patient Re-evaluated prior to induction Oxygen Delivery Method: Circle system utilized Preoxygenation: Pre-oxygenation with 100% oxygen Induction Type: IV induction LMA: LMA inserted LMA Size: 4.0 Tube type: Oral Number of attempts: 2 Placement Confirmation: positive ETCO2 and breath sounds checked- equal and bilateral Tube secured with: Tape Dental Injury: Teeth and Oropharynx as per pre-operative assessment

## 2021-11-30 NOTE — Anesthesia Preprocedure Evaluation (Signed)
Anesthesia Evaluation  Patient identified by MRN, date of birth, ID band Patient awake    Reviewed: Allergy & Precautions, NPO status , Patient's Chart, lab work & pertinent test results  Airway Mallampati: II  TM Distance: >3 FB Neck ROM: Full    Dental  (+) Dental Advisory Given, Upper Dentures, Partial Lower   Pulmonary asthma , COPD,  COPD inhaler, Current Smoker and Patient abstained from smoking.,    Pulmonary exam normal breath sounds clear to auscultation       Cardiovascular hypertension, Pt. on medications + CAD, + Peripheral Vascular Disease and + DOE  Normal cardiovascular exam+ Valvular Problems/Murmurs AI  Rhythm:Regular Rate:Normal  Left ventricular ejection fraction, by estimation, is 55 to 60%. The left ventricle has normal function. The left ventricle has no regional wall motion abnormalities. Left ventricular diastolic parameters are indeterminate. 1. Right ventricular systolic function is normal. The right ventricular size is mildly enlarged. There is mildly elevated pulmonary artery systolic pressure. 2. The mitral valve is normal in structure. Trivial mitral valve regurgitation. No evidence of mitral stenosis. 3. The aortic valve has an indeterminant number of cusps. There is severe calcifcation of the aortic valve. There is severe thickening of the aortic valve. Aortic valve regurgitation is mild. Mild to moderate aortic valve sclerosis/calcification is present, without any evidence of aortic stenosis. Aortic regurgitation PHT measures 482 msec. 4. Aortic dilatation noted. There is borderline dilatation of the aortic root, measuring 38 mm. 5. The inferior vena cava is normal in size with greater than 50% respiratory variability, suggesting right atrial pressure of 3 mmHg.  Stress 9/22 no ischemia   Neuro/Psych  Neuromuscular disease (cervical radiculopathy) negative psych ROS   GI/Hepatic Neg liver  ROS, GERD  ,ANAL CANAL POLYP   Endo/Other  negative endocrine ROS  Renal/GU Renal Insufficiency and CRFRenal disease   Prostate cancer     Musculoskeletal negative musculoskeletal ROS (+)   Abdominal   Peds  Hematology negative hematology ROS (+)   Anesthesia Other Findings Day of surgery medications reviewed with the patient.  Reproductive/Obstetrics                             Anesthesia Physical  Anesthesia Plan  ASA: 3  Anesthesia Plan: General   Post-op Pain Management:    Induction: Intravenous  PONV Risk Score and Plan: 1  Airway Management Planned: LMA  Additional Equipment:   Intra-op Plan:   Post-operative Plan:   Informed Consent: I have reviewed the patients History and Physical, chart, labs and discussed the procedure including the risks, benefits and alternatives for the proposed anesthesia with the patient or authorized representative who has indicated his/her understanding and acceptance.     Dental advisory given  Plan Discussed with: CRNA  Anesthesia Plan Comments:         Anesthesia Quick Evaluation

## 2021-11-30 NOTE — Anesthesia Postprocedure Evaluation (Signed)
Anesthesia Post Note  Patient: Andrew [name]  Procedure(s) Performed: GOLD SEED IMPLANT (Prostate) SPACE OAR INSTILLATION (Prostate)  Patient location during evaluation: PACU Anesthesia Type: General Level of consciousness: awake and alert Pain management: pain level controlled Vital Signs Assessment: post-procedure vital signs reviewed and stable Respiratory status: spontaneous breathing, nonlabored ventilation, respiratory function stable and patient connected to nasal cannula oxygen Cardiovascular status: blood pressure returned to baseline and stable Postop Assessment: no apparent nausea or vomiting Anesthetic complications: no   No notable events documented.   Last Vitals:  Vitals:   11/30/21 1130 11/30/21 1145  BP: (!) 157/59 (!) 145/83  Pulse: 61 63  Resp: 10 16  Temp:    SpO2: 98% 96%    Last Pain:  Vitals:   11/30/21 1145  PainSc: 0-No pain                 Trixie Rude

## 2021-12-01 ENCOUNTER — Encounter (HOSPITAL_COMMUNITY): Payer: Self-pay | Admitting: Urology

## 2021-12-04 ENCOUNTER — Ambulatory Visit: Payer: Medicare HMO | Admitting: Urology

## 2021-12-04 ENCOUNTER — Ambulatory Visit: Payer: Medicare HMO | Admitting: Radiation Oncology

## 2021-12-04 ENCOUNTER — Telehealth: Payer: Self-pay | Admitting: *Deleted

## 2021-12-04 NOTE — Telephone Encounter (Signed)
CALLED PATIENT TO REMIND OF SIM APPT. FOR 12-05-22- ARRIVAL TIME- 8:45 AM @ [facility], INFORMED PATIENT TO COME WITH A FULL BLADDER AND AN EMPTY BOWEL, SPOKE WITH PATIENT AND HE VERIFIED UNDERSTANDING THE APPT. AND THE INSTRUCTIONS FOR THE APPT.

## 2021-12-05 ENCOUNTER — Other Ambulatory Visit: Payer: Self-pay

## 2021-12-05 ENCOUNTER — Ambulatory Visit
Admission: RE | Admit: 2021-12-05 | Discharge: 2021-12-05 | Disposition: A | Discharge status: Home / Self Care | Payer: Medicare HMO | Source: Ambulatory Visit | Attending: Radiation Oncology | Admitting: Radiation Oncology

## 2021-12-05 DIAGNOSIS — Z510 Encounter for antineoplastic radiation therapy: Secondary | ICD-10-CM | POA: Diagnosis not present

## 2021-12-05 DIAGNOSIS — C61 Malignant neoplasm of prostate: Secondary | ICD-10-CM | POA: Diagnosis not present

## 2021-12-05 NOTE — Progress Notes (Signed)
°  Radiation Oncology         [phone] ________________________________  Name: Andrew [name] MRN: [id number]  Date: 12/05/2021  DOB: [birth year]  SIMULATION AND TREATMENT PLANNING NOTE    ICD-10-CM   1. Prostate cancer [name]  C61       DIAGNOSIS:  71 y.o. gentleman with Stage T1c adenocarcinoma of the prostate with Gleason score of 3+4, and PSA of 20.5.  NARRATIVE:  The patient was brought to the [place].  Identity was confirmed.  All relevant records and images related to the planned course of therapy were reviewed.  The patient freely provided informed written consent to proceed with treatment after reviewing the details related to the planned course of therapy. The consent form was witnessed and verified by the simulation staff.  Then, the patient was set-up in a stable reproducible supine position for radiation therapy.  A vacuum lock pillow device was custom fabricated to position his legs in a reproducible immobilized position.  Then, I performed a urethrogram under sterile conditions to identify the prostatic apex.  CT images were obtained.  Surface markings were placed.  The CT images were loaded into the planning software.  Then the prostate target and avoidance structures including the rectum, bladder, bowel and hips were contoured.  Treatment planning then occurred.  The radiation prescription was entered and confirmed.  A total of one complex treatment devices was fabricated. I have requested : Intensity Modulated Radiotherapy (IMRT) is medically necessary for this case for the following reason:  Rectal sparing.Marland Kitchen  PLAN:  The patient will receive 70 Gy in 28 fractions.  ________________________________  Sheral Apley Tammi Klippel, M.D.

## 2021-12-11 ENCOUNTER — Other Ambulatory Visit: Payer: Self-pay

## 2021-12-11 DIAGNOSIS — Z510 Encounter for antineoplastic radiation therapy: Secondary | ICD-10-CM | POA: Diagnosis not present

## 2021-12-11 DIAGNOSIS — C61 Malignant neoplasm of prostate: Secondary | ICD-10-CM | POA: Diagnosis not present

## 2021-12-11 DIAGNOSIS — I10 Essential (primary) hypertension: Secondary | ICD-10-CM

## 2021-12-11 MED ORDER — OLMESARTAN MEDOXOMIL 40 MG PO TABS: 40.0000 mg | ORAL_TABLET | Freq: Every day | ORAL | 3 refills | Status: DC

## 2021-12-11 NOTE — Progress Notes (Signed)
Received a fax refill from [facility]. Refilled medication.

## 2021-12-13 ENCOUNTER — Ambulatory Visit (INDEPENDENT_AMBULATORY_CARE_PROVIDER_SITE_OTHER): Payer: Medicare HMO | Admitting: Urology

## 2021-12-13 ENCOUNTER — Other Ambulatory Visit: Payer: Self-pay

## 2021-12-13 ENCOUNTER — Ambulatory Visit: Payer: Medicare HMO | Admitting: Cardiology

## 2021-12-13 VITALS — BP 174/69 | HR 67

## 2021-12-13 DIAGNOSIS — R351 Nocturia: Secondary | ICD-10-CM

## 2021-12-13 DIAGNOSIS — C61 Malignant neoplasm of prostate: Secondary | ICD-10-CM

## 2021-12-13 LAB — URINALYSIS, ROUTINE W REFLEX MICROSCOPIC
Bilirubin, UA: NEGATIVE
Glucose, UA: NEGATIVE
Ketones, UA: NEGATIVE
Leukocytes,UA: NEGATIVE
Nitrite, UA: NEGATIVE
Protein,UA: NEGATIVE
Specific Gravity, UA: 1.015 (ref 1.005–1.030)
Urobilinogen, Ur: 0.2 mg/dL (ref 0.2–1.0)
pH, UA: 5 (ref 5.0–7.5)

## 2021-12-13 LAB — MICROSCOPIC EXAMINATION
Bacteria, UA: NONE SEEN
Epithelial Cells (non renal): NONE SEEN /hpf (ref 0–10)
RBC, Urine: NONE SEEN /hpf (ref 0–2)
Renal Epithel, UA: NONE SEEN /hpf
WBC, UA: NONE SEEN /hpf (ref 0–5)

## 2021-12-13 NOTE — Progress Notes (Signed)
Urological Symptom Review  Patient is experiencing the following symptoms: Hard to postpone urination   Review of Systems  Gastrointestinal (upper)  : Negative for upper GI symptoms  Gastrointestinal (lower) : Negative for lower GI symptoms  Constitutional : Negative for symptoms  Skin: Negative for skin symptoms  Eyes: Negative for eye symptoms  Ear/Nose/Throat : Negative for Ear/Nose/Throat symptoms  Hematologic/Lymphatic: Negative for Hematologic/Lymphatic symptoms  Cardiovascular : Negative for cardiovascular symptoms  Respiratory : Negative for respiratory symptoms  Endocrine: Negative for endocrine symptoms  Musculoskeletal: Negative for musculoskeletal symptoms  Neurological: Negative for neurological symptoms  Psychologic: Negative for psychiatric symptoms

## 2021-12-13 NOTE — Patient Instructions (Signed)
Prostate Cancer The prostate is a small gland that produces fluid that makes up semen (seminal fluid). It is located below the bladder in men, in front of the rectum. Prostate cancer is the abnormal growth of cells in the prostate gland. What are the causes? The exact cause of this condition is not known. What increases the risk? You are more likely to develop this condition if: You are 71 years of age or older. You have a family history of prostate cancer. You have a family history of breast and ovarian cancer. You have genes that are passed from parent to child (inherited), such as BRCA1 and BRCA2. You have Lynch syndrome. African American men and men of African descent are diagnosed with prostate cancer at higher rates than other men. The reasons for this are not well understood and are likely due to a combination of genetic and environmental factors. What are the signs or symptoms? Symptoms of this condition include: Problems with urination. This may include: A weak or interrupted flow of urine. Trouble starting or stopping urination. Trouble emptying the bladder all the way. The need to urinate more often, especially at night. Blood in urine or semen. Persistent pain or discomfort in the lower back, lower abdomen, or hips. Trouble getting an erection. Weakness or numbness in the legs or feet. How is this diagnosed? This condition can be diagnosed with: A digital rectal exam. For this exam, a health care provider inserts a gloved finger into the rectum to feel the prostate gland. A blood test called a prostate-specific antigen (PSA) test. A procedure in which a sample of tissue is taken from the prostate and checked under a microscope (prostate biopsy). An imaging test called transrectal ultrasonography. Once the condition is diagnosed, tests will be done to determine how far the cancer has spread. This is called staging the cancer. Staging may involve imaging tests, such as a bone  scan, CT scan, PET scan, or MRI. Stages of prostate cancer The stages of prostate cancer are as follows: Stage 1 (I). At this stage, the cancer is found in the prostate only. The cancer is not visible on imaging tests, and it is usually found by accident, such as during prostate surgery. Stage 2 (II). At this stage, the cancer is more advanced than it is in stage 1, but the cancer has not spread outside the prostate. Stage 3 (III). At this stage, the cancer has spread beyond the outer layer of the prostate to nearby tissues. The cancer may be found in the seminal vesicles, which are near the bladder and the prostate. Stage 4 (IV). At this stage, the cancer has spread to other parts of the body, such as the lymph nodes, bones, bladder, rectum, liver, or lungs. Prostate cancer grading Prostate cancer is also graded according to how the cancer cells look under a microscope. This is called the Gleason score and the total score can range from 6-10, indicating how likely it is that the cancer will spread (metastasize) to other parts of the body. The higher the score, the greater the likelihood that the cancer will spread. Gleason 6 or lower: This indicates that the cancer cells look similar to normal prostate cells (well differentiated). Gleason 7: This indicates that the cancer cells look somewhat similar to normal prostate cells (moderately differentiated). Gleason 8, 9, or 10: This indicates that the cancer cells look very different than normal prostate cells (poorly differentiated). How is this treated? Treatment for this condition depends on several factors,  including the stage of the cancer, your age, personal preferences, and your overall health. Talk with your health care provider about treatment options that are recommended for you. Common treatments include: Observation for early stage prostate cancer (active surveillance). This involves having exams, blood tests, and in some cases, more biopsies.  For some men, this is the only treatment needed. Surgery. Types of surgeries include: Open surgery (radical prostatectomy). In this surgery, a larger incision is made to remove the prostate. A laparoscopic radical prostatectomy. This is a surgery to remove the prostate and lymph nodes through several small incisions. It is often referred to as a minimally invasive surgery. A robotic radical prostatectomy. This is laparoscopic surgery to remove the prostate and lymph nodes with the help of robotic arms that are controlled by the surgeon. Cryoablation. This is surgery to freeze and destroy cancer cells. Radiation treatment. Types of radiation treatment include: External beam radiation. This type aims beams of radiation from outside the body at the prostate to destroy cancerous cells. Brachytherapy. This type uses radioactive needles, seeds, wires, or tubes that are implanted into the prostate gland. Like external beam radiation, brachytherapy destroys cancerous cells. An advantage is that this type of radiation limits the damage to surrounding tissue and has fewer side effects. Chemotherapy. This treatment kills cancer cells or stops them from multiplying. It kills both cancer cells and normal cells. Targeted therapy. This treatment uses medicines to kill cancer cells without damaging normal cells. Hormone treatment. This treatment involves taking medicines that act on testosterone, one of the male hormones, by: Stopping your body from producing testosterone. Blocking testosterone from reaching cancer cells. Follow these instructions at home: Lifestyle Do not use any products that contain nicotine or tobacco. These products include cigarettes, chewing tobacco, and vaping devices, such as e-cigarettes. If you need help quitting, ask your health care provider. Eat a healthy diet. To do this: Eat foods that are high in fiber. These include beans, whole grains, and fresh fruits and vegetables. Limit  foods that are high in fat and sugar. These include fried or sweet foods. Treatment for prostate cancer may affect sexual function. If you have a partner, continue to have intimate moments. This may include touching, holding, hugging, and caressing your partner. Get plenty of sleep. Consider joining a support group for men who have prostate cancer. Meeting with a support group may help you learn to manage the stress of having cancer. General instructions Take over-the-counter and prescription medicines only as told by your health care provider. If you have to go to the hospital, notify your cancer specialist (oncologist). Keep all follow-up visits. This is important. Where to find more information American Cancer Society: www.cancer.[place] [facility] Clinical Oncology: www.cancer.net [employer]: www.cancer.gov Contact a health care provider if: You have new or increasing trouble urinating. You have new or increasing blood in your urine. You have new or increasing pain in your hips, back, or chest. Get help right away if: You have weakness or numbness in your legs. You cannot control urination or your bowel movements (incontinence). You have chills or a fever. Summary The prostate is a small gland that is involved in the production of semen. It is located below a man's bladder, in front of the rectum. Prostate cancer is the abnormal growth of cells in the prostate gland. Treatment for this condition depends on the stage of the cancer, your age, personal preferences, and your overall health. Talk with your health care provider about treatment  options that are recommended for you. Consider joining a support group for men who have prostate cancer. Meeting with a support group may help you learn to manage the stress of having cancer. This information is not intended to replace advice given to you by your health care provider. Make sure you discuss any questions you have with  your health care provider. Document Revised: 01/25/2021 Document Reviewed: 01/25/2021 Elsevier Patient Education  [employer].

## 2021-12-13 NOTE — Progress Notes (Signed)
12/13/2021 3:41 PM   Andrew [name] February 02, 1951 [id number]  Referring provider: Horald Pollen, MD [place],  [place] [address]  Followup prostate cancer  HPI: [name] is a 71yo here for followup for prostate cancer. He underwent fiducial markers and SpaceOAR 2 weeks ago. He denies any worsening LUTS. He is currently on uroxatral 10mg  daily. No perineal pain. He is scheduled to start radition this week.    PMH: Past Medical History:  Diagnosis Date   Allergic rhinitis    Anal lesion    polypoid   Atherosclerosis of aorta (HCC)    Carotid artery stenosis, asymptomatic, bilateral 11/2017   last carotid ultrasound in epic 07-19-2021  [name] ECA >50% and left ICA 40-59% stenosis   CKD (chronic kidney disease), stage III ([place])    followed by pcp;  previously followed by nephrologist--- dr Ambrose Pancoast  (per pt lov appprox 2020)   DOE (dyspnea on exertion)    10-27-2021  pt stated has normal doe due to COPD, but past couple days it has even more so, Dr Melvyn Novas started pt on prednisone today   ED (erectile dysfunction)    GERD (gastroesophageal reflux disease)    History of adenomatous polyp of colon    Hyperplasia of prostate with lower urinary tract symptoms (LUTS)    Hypertension    followed by pcp;   nuclear stress test 07-13-2021 low risk no ischemia w/ nuclear ef 62%;  echo 07-13-2021 ef 55-60%, aortic valve w/ mild AR with mild to moderate sclerosis, RVSP 36.24mmHg   Peripheral vascular disease ([place])    Pre-diabetes    Prostate cancer [facility]) 2018   urologist-- dr Alyson Ingles;  first dx 02/ 2018 Gleason 3+3, PSA 6.4 active survillance;  MRI fusion bx 04-21-2021, Gleason 3+4, PSA 20.5   Renal cyst, acquired    bilateral   Stage 2 moderate COPD by GOLD classification [facility])    pulmonologist--- dr Melvyn Novas;  emphysema still smoking ;  pt does not have oxygen (10-27-2021  pt stated trying to quit last cigarette in 7 days, had cut done to 3-4 cig per week)   Wears dentures     full upper and partial lower   Wears glasses     Surgical History: Past Surgical History:  Procedure Laterality Date   COLONOSCOPY WITH PROPOFOL  03/28/2021   by dr Ardis Hughs   GOLD SEED IMPLANT N/A 11/30/2021   Procedure: GOLD SEED IMPLANT;  Surgeon: Cleon Gustin, MD;  Location: [facility];  Service: Urology;  Laterality: N/A;   NO PAST SURGERIES     SPACE OAR INSTILLATION N/A 11/30/2021   Procedure: SPACE OAR INSTILLATION;  Surgeon: Cleon Gustin, MD;  Location: [facility];  Service: Urology;  Laterality: N/A;   TRANSANAL EXCISION OF RECTAL MASS N/A 10/30/2021   Procedure: TRANSANAL EXCISION OF ANAL CANAL POLYPOID LESION;  Surgeon: Ileana Roup, MD;  Location: [place];  Service: General;  Laterality: N/A;    Home Medications:  Allergies as of 12/13/2021       Reactions   Lisinopril Cough   Lisinopril-hydrochlorothiazide    dizziness        Medication List        Accurate as of December 13, 2021  3:41 PM. If you have any questions, ask your nurse or doctor.          acetaminophen 500 MG tablet Commonly known as: TYLENOL Take 1,000 mg by mouth every 6 (six) hours as needed for  moderate pain.   albuterol 108 (90 Base) MCG/ACT inhaler Commonly known as: VENTOLIN HFA Inhale 2 puffs into the lungs every 4 (four) hours as needed for wheezing or shortness of breath (cough, shortness of breath or wheezing.).   alfuzosin 10 MG 24 hr tablet Commonly known as: UROXATRAL Take 1 tablet (10 mg total) by mouth daily with breakfast.   budesonide-formoterol 160-4.5 MCG/ACT inhaler Commonly known as: SYMBICORT Inhale 2 puffs into the lungs 2 (two) times daily.   cetirizine 10 MG tablet Commonly known as: ZYRTEC TAKE 1 TABLET EVERY DAY   CORICIDIN HBP PO Take 1 tablet by mouth daily as needed (allergies).   diltiazem 240 MG 24 hr capsule Commonly known as: DILACOR XR TAKE 1 CAPSULE EVERY DAY   fluticasone 50 MCG/ACT nasal spray Commonly  known as: FLONASE Place 1 spray into both nostrils daily. What changed:  how much to take when to take this reasons to take this   Mucinex Maximum Strength 1200 MG Tb12 Generic drug: Guaifenesin Take 1,200 mg by mouth 2 (two) times daily as needed (congestion).   olmesartan 40 MG tablet Commonly known as: BENICAR Take 1 tablet (40 mg total) by mouth daily.   omeprazole 20 MG tablet Commonly known as: PRILOSEC OTC Take 20 mg by mouth daily as needed (acid reflux).   rosuvastatin 10 MG tablet Commonly known as: Crestor Take 1 tablet (10 mg total) by mouth daily.   tadalafil 20 MG tablet Commonly known as: CIALIS Take 1 tablet (20 mg total) by mouth daily as needed for erectile dysfunction.   traMADol 50 MG tablet Commonly known as: ULTRAM Take 1 tablet (50 mg total) by mouth every 6 (six) hours as needed.   triamcinolone cream 0.1 % Commonly known as: KENALOG APPLY 1 APPLICATION TOPICALLY 2 (TWO) TIMES DAILY. What changed:  when to take this reasons to take this        Allergies:  Allergies  Allergen Reactions   Lisinopril Cough   Lisinopril-Hydrochlorothiazide     dizziness    Family History: Family History  Problem Relation Age of Onset   Colon cancer Neg Hx    Esophageal cancer Neg Hx    Rectal cancer Neg Hx    Stomach cancer Neg Hx    Colon polyps Neg Hx     Social History:  reports that he has been smoking cigarettes. He has never used smokeless tobacco. He reports that he does not currently use alcohol. He reports that he does not currently use drugs after having used the following drugs: Marijuana and Cocaine.  ROS: All other review of systems were reviewed and are negative except what is noted above in HPI  Physical Exam: BP (!) 174/69    Pulse 67   Constitutional:  Alert and oriented, No acute distress. HEENT: [place] AT, moist mucus membranes.  Trachea midline, no masses. Cardiovascular: No clubbing, cyanosis, or edema. Respiratory: Normal  respiratory effort, no increased work of breathing. GI: Abdomen is soft, nontender, nondistended, no abdominal masses GU: No CVA tenderness.  Lymph: No cervical or inguinal lymphadenopathy. Skin: No rashes, bruises or suspicious lesions. Neurologic: Grossly intact, no focal deficits, moving all 4 extremities. Psychiatric: Normal mood and affect.  Laboratory Data: Lab Results  Component Value Date   WBC 6.3 07/08/2018   HGB 13.9 10/30/2021   HCT 41.0 10/30/2021   MCV 85.3 07/08/2018   PLT 268.0 07/08/2018    Lab Results  Component Value Date   CREATININE 1.77 (H) 11/28/2021  Lab Results  Component Value Date   PSA 5.4 (H) 09/14/2016   PSA 5.58 (H) 01/24/2016    No results found for: TESTOSTERONE  Lab Results  Component Value Date   HGBA1C 5.6 10/02/2019    Urinalysis    Component Value Date/Time   APPEARANCEUR Clear 06/09/2021 1209   GLUCOSEU Negative 06/09/2021 1209   BILIRUBINUR Negative 06/09/2021 1209   KETONESUR negative 10/17/2017 1615   PROTEINUR Trace (A) 06/09/2021 1209   UROBILINOGEN 0.2 10/17/2017 1615   NITRITE Negative 06/09/2021 1209   LEUKOCYTESUR Negative 06/09/2021 1209    Lab Results  Component Value Date   LABMICR See below: 06/09/2021   WBCUA None seen 06/09/2021   LABEPIT None seen 06/09/2021   MUCUS Present 06/09/2021   BACTERIA None seen 06/09/2021    Pertinent Imaging:  No results found for this or any previous visit.  No results found for this or any previous visit.  No results found for this or any previous visit.  No results found for this or any previous visit.  Results for orders placed during the hospital encounter of 05/27/20  US RENAL  Narrative CLINICAL DATA:  CKD stage 3  EXAM: RENAL / URINARY TRACT ULTRASOUND COMPLETE  COMPARISON:  Chest CT acquired on September 25, 2019, limited assessment of the kidneys at that time.  FINDINGS: [name] Kidney:  Renal measurements: 10.7 x 4.0 x 4.5 cm = volume: 102  mL. Small hypoechoic to anechoic focus in the upper pole of the [name] kidney shows some central increased echogenicity and perhaps multi septate changes.  3.9 x 3.3 x 2.7 cm anechoic cyst with adjacent smaller cystic area 1.2 x 1.3 x 1.3 cm cannot be classified as a simple cyst and does not show overt signs of flow. No hydronephrosis. Decreased corticomedullary differentiation.  Left Kidney:  Renal measurements: 10.6 x 5.7 x 4.8 cm = volume: 151 mL. Decreased corticomedullary differentiation without hydronephrosis. Small mildly complex areas with multi septate or spongiform appearance in the upper pole and interpolar portion largest measuring 1.6 x 1.4 x 1.5 cm with smaller areas, less than 10 also showing some cystic characteristics.  Bladder:  Appears normal for degree of bladder distention.  Other:  None.  IMPRESSION: 1. Decreased corticomedullary differentiation without hydronephrosis. Findings can be seen in the setting of medical renal disease. 2. Multiple cystic lesions in the bilateral kidneys without renal enlargement. Some of these areas display internal complexity/variable echogenicity. MRI may be helpful for further evaluation to exclude the presence of enhancing components/small cystic renal neoplasm.  These results will be called to the ordering clinician or representative by the Radiologist Assistant, and communication documented in the PACS or [employer].   Electronically Signed By: Zetta Bills M.D. On: 05/30/2020 10:55  No results found for this or any previous visit.  No results found for this or any previous visit.  No results found for this or any previous visit.   Assessment & Plan:    1. Prostate cancer ([place]) -RTC 5 months with PSA - Urinalysis, Routine w reflex microscopic  2. Nocturia -Patient to call if his LUTS worsening during radiation therapy.    No follow-ups on file.  Nicolette Bang, MD  [facility]  [place]

## 2021-12-14 ENCOUNTER — Ambulatory Visit
Admission: RE | Admit: 2021-12-14 | Discharge: 2021-12-14 | Disposition: A | Discharge status: Home / Self Care | Payer: Medicare HMO | Source: Ambulatory Visit | Attending: Radiation Oncology | Admitting: Radiation Oncology

## 2021-12-14 DIAGNOSIS — C61 Malignant neoplasm of prostate: Secondary | ICD-10-CM | POA: Insufficient documentation

## 2021-12-14 DIAGNOSIS — Z510 Encounter for antineoplastic radiation therapy: Secondary | ICD-10-CM | POA: Insufficient documentation

## 2021-12-15 ENCOUNTER — Other Ambulatory Visit: Payer: Self-pay

## 2021-12-15 ENCOUNTER — Ambulatory Visit
Admission: RE | Admit: 2021-12-15 | Discharge: 2021-12-15 | Disposition: A | Discharge status: Home / Self Care | Payer: Medicare HMO | Source: Ambulatory Visit | Attending: Radiation Oncology | Admitting: Radiation Oncology

## 2021-12-15 ENCOUNTER — Ambulatory Visit: Payer: Medicare HMO | Admitting: Urology

## 2021-12-15 DIAGNOSIS — C61 Malignant neoplasm of prostate: Secondary | ICD-10-CM | POA: Diagnosis not present

## 2021-12-15 DIAGNOSIS — Z510 Encounter for antineoplastic radiation therapy: Secondary | ICD-10-CM | POA: Diagnosis not present

## 2021-12-15 NOTE — Progress Notes (Signed)
Pt here for patient teaching.    Pt given Radiation and You booklet and skin care instructions.    Reviewed areas of pertinence such as diarrhea, fatigue, hair loss, nausea and vomiting, sexual and fertility changes, skin changes, and urinary and bladder changes .   Pt able to give teach back of to pat skin, use unscented/gentle soap, use baby wipes, have Imodium on hand, and drink plenty of water,avoid applying anything to skin within 4 hours of treatment.   Pt verbalizes understanding of information given and will contact nursing with any questions or concerns.

## 2021-12-18 ENCOUNTER — Ambulatory Visit
Admission: RE | Admit: 2021-12-18 | Discharge: 2021-12-18 | Disposition: A | Discharge status: Home / Self Care | Payer: Medicare HMO | Source: Ambulatory Visit | Attending: Radiation Oncology | Admitting: Radiation Oncology

## 2021-12-18 ENCOUNTER — Other Ambulatory Visit: Payer: Self-pay

## 2021-12-18 DIAGNOSIS — Z510 Encounter for antineoplastic radiation therapy: Secondary | ICD-10-CM | POA: Diagnosis not present

## 2021-12-18 DIAGNOSIS — C61 Malignant neoplasm of prostate: Secondary | ICD-10-CM | POA: Diagnosis not present

## 2021-12-19 ENCOUNTER — Ambulatory Visit
Admission: RE | Admit: 2021-12-19 | Discharge: 2021-12-19 | Disposition: A | Discharge status: Home / Self Care | Payer: Medicare HMO | Source: Ambulatory Visit | Attending: Radiation Oncology | Admitting: Radiation Oncology

## 2021-12-19 DIAGNOSIS — Z510 Encounter for antineoplastic radiation therapy: Secondary | ICD-10-CM | POA: Diagnosis not present

## 2021-12-19 DIAGNOSIS — C61 Malignant neoplasm of prostate: Secondary | ICD-10-CM | POA: Diagnosis not present

## 2021-12-20 ENCOUNTER — Other Ambulatory Visit: Payer: Self-pay

## 2021-12-20 ENCOUNTER — Ambulatory Visit
Admission: RE | Admit: 2021-12-20 | Discharge: 2021-12-20 | Disposition: A | Discharge status: Home / Self Care | Payer: Medicare HMO | Source: Ambulatory Visit | Attending: Radiation Oncology | Admitting: Radiation Oncology

## 2021-12-20 ENCOUNTER — Telehealth: Payer: Self-pay

## 2021-12-20 DIAGNOSIS — C61 Malignant neoplasm of prostate: Secondary | ICD-10-CM | POA: Diagnosis not present

## 2021-12-20 DIAGNOSIS — Z510 Encounter for antineoplastic radiation therapy: Secondary | ICD-10-CM | POA: Diagnosis not present

## 2021-12-20 NOTE — Telephone Encounter (Signed)
Patient called to inform MD since starting radiation treatment he has urinary frequency. Message sent to MD

## 2021-12-21 ENCOUNTER — Other Ambulatory Visit: Payer: Self-pay

## 2021-12-21 ENCOUNTER — Ambulatory Visit
Admission: RE | Admit: 2021-12-21 | Discharge: 2021-12-21 | Disposition: A | Discharge status: Home / Self Care | Payer: Medicare HMO | Source: Ambulatory Visit | Attending: Radiation Oncology | Admitting: Radiation Oncology

## 2021-12-21 ENCOUNTER — Encounter: Payer: Self-pay | Admitting: Urology

## 2021-12-21 DIAGNOSIS — C61 Malignant neoplasm of prostate: Secondary | ICD-10-CM | POA: Diagnosis not present

## 2021-12-21 DIAGNOSIS — Z510 Encounter for antineoplastic radiation therapy: Secondary | ICD-10-CM | POA: Diagnosis not present

## 2021-12-21 MED ORDER — ALFUZOSIN HCL ER 10 MG PO TB24: 10.0000 mg | ORAL_TABLET | Freq: Two times a day (BID) | ORAL | 3 refills | Status: DC

## 2021-12-21 NOTE — Telephone Encounter (Signed)
My chart message sent of medication change

## 2021-12-22 ENCOUNTER — Other Ambulatory Visit: Payer: Self-pay

## 2021-12-22 ENCOUNTER — Ambulatory Visit
Admission: RE | Admit: 2021-12-22 | Discharge: 2021-12-22 | Disposition: A | Discharge status: Home / Self Care | Payer: Medicare HMO | Source: Ambulatory Visit | Attending: Radiation Oncology | Admitting: Radiation Oncology

## 2021-12-22 DIAGNOSIS — Z510 Encounter for antineoplastic radiation therapy: Secondary | ICD-10-CM | POA: Diagnosis not present

## 2021-12-22 DIAGNOSIS — C61 Malignant neoplasm of prostate: Secondary | ICD-10-CM | POA: Diagnosis not present

## 2021-12-25 ENCOUNTER — Other Ambulatory Visit: Payer: Self-pay

## 2021-12-25 ENCOUNTER — Ambulatory Visit
Admission: RE | Admit: 2021-12-25 | Discharge: 2021-12-25 | Disposition: A | Discharge status: Home / Self Care | Payer: Medicare HMO | Source: Ambulatory Visit | Attending: Radiation Oncology | Admitting: Radiation Oncology

## 2021-12-25 DIAGNOSIS — R361 Hematospermia: Secondary | ICD-10-CM | POA: Diagnosis not present

## 2021-12-25 DIAGNOSIS — N1831 Chronic kidney disease, stage 3a: Secondary | ICD-10-CM | POA: Diagnosis not present

## 2021-12-25 DIAGNOSIS — R7303 Prediabetes: Secondary | ICD-10-CM | POA: Diagnosis not present

## 2021-12-25 DIAGNOSIS — Z720 Tobacco use: Secondary | ICD-10-CM | POA: Diagnosis not present

## 2021-12-25 DIAGNOSIS — C61 Malignant neoplasm of prostate: Secondary | ICD-10-CM | POA: Diagnosis not present

## 2021-12-25 DIAGNOSIS — J449 Chronic obstructive pulmonary disease, unspecified: Secondary | ICD-10-CM | POA: Diagnosis not present

## 2021-12-25 DIAGNOSIS — E785 Hyperlipidemia, unspecified: Secondary | ICD-10-CM | POA: Diagnosis not present

## 2021-12-25 DIAGNOSIS — I129 Hypertensive chronic kidney disease with stage 1 through stage 4 chronic kidney disease, or unspecified chronic kidney disease: Secondary | ICD-10-CM | POA: Diagnosis not present

## 2021-12-25 DIAGNOSIS — Z510 Encounter for antineoplastic radiation therapy: Secondary | ICD-10-CM | POA: Diagnosis not present

## 2021-12-26 ENCOUNTER — Other Ambulatory Visit: Payer: Self-pay

## 2021-12-26 ENCOUNTER — Ambulatory Visit
Admission: RE | Admit: 2021-12-26 | Discharge: 2021-12-26 | Disposition: A | Discharge status: Home / Self Care | Payer: Medicare HMO | Source: Ambulatory Visit | Attending: Radiation Oncology | Admitting: Radiation Oncology

## 2021-12-26 DIAGNOSIS — Z510 Encounter for antineoplastic radiation therapy: Secondary | ICD-10-CM | POA: Diagnosis not present

## 2021-12-26 DIAGNOSIS — C61 Malignant neoplasm of prostate: Secondary | ICD-10-CM | POA: Diagnosis not present

## 2021-12-27 ENCOUNTER — Telehealth: Payer: Self-pay

## 2021-12-27 ENCOUNTER — Ambulatory Visit
Admission: RE | Admit: 2021-12-27 | Discharge: 2021-12-27 | Disposition: A | Discharge status: Home / Self Care | Payer: Medicare HMO | Source: Ambulatory Visit | Attending: Radiation Oncology | Admitting: Radiation Oncology

## 2021-12-27 ENCOUNTER — Other Ambulatory Visit: Payer: Self-pay

## 2021-12-27 DIAGNOSIS — Z510 Encounter for antineoplastic radiation therapy: Secondary | ICD-10-CM | POA: Diagnosis not present

## 2021-12-27 DIAGNOSIS — C61 Malignant neoplasm of prostate: Secondary | ICD-10-CM | POA: Diagnosis not present

## 2021-12-27 MED ORDER — ALFUZOSIN HCL ER 10 MG PO TB24: 10.0000 mg | ORAL_TABLET | Freq: Two times a day (BID) | ORAL | 3 refills | Status: DC

## 2021-12-27 NOTE — Telephone Encounter (Signed)
Pt called to have rx sent to [facility] in [place], instead of mail order. Rx has been sent to correct pharmacy

## 2021-12-28 ENCOUNTER — Ambulatory Visit
Admission: RE | Admit: 2021-12-28 | Discharge: 2021-12-28 | Disposition: A | Discharge status: Home / Self Care | Payer: Medicare HMO | Source: Ambulatory Visit | Attending: Radiation Oncology | Admitting: Radiation Oncology

## 2021-12-28 ENCOUNTER — Other Ambulatory Visit: Payer: Self-pay

## 2021-12-28 DIAGNOSIS — Z510 Encounter for antineoplastic radiation therapy: Secondary | ICD-10-CM | POA: Diagnosis not present

## 2021-12-28 DIAGNOSIS — C61 Malignant neoplasm of prostate: Secondary | ICD-10-CM | POA: Diagnosis not present

## 2021-12-29 ENCOUNTER — Ambulatory Visit
Admission: RE | Admit: 2021-12-29 | Discharge: 2021-12-29 | Disposition: A | Discharge status: Home / Self Care | Payer: Medicare HMO | Source: Ambulatory Visit | Attending: Radiation Oncology | Admitting: Radiation Oncology

## 2021-12-29 DIAGNOSIS — Z510 Encounter for antineoplastic radiation therapy: Secondary | ICD-10-CM | POA: Diagnosis not present

## 2021-12-29 DIAGNOSIS — C61 Malignant neoplasm of prostate: Secondary | ICD-10-CM | POA: Diagnosis not present

## 2022-01-01 ENCOUNTER — Other Ambulatory Visit: Payer: Self-pay

## 2022-01-01 ENCOUNTER — Ambulatory Visit
Admission: RE | Admit: 2022-01-01 | Discharge: 2022-01-01 | Disposition: A | Discharge status: Home / Self Care | Payer: Medicare HMO | Source: Ambulatory Visit | Attending: Radiation Oncology | Admitting: Radiation Oncology

## 2022-01-01 DIAGNOSIS — C61 Malignant neoplasm of prostate: Secondary | ICD-10-CM | POA: Diagnosis not present

## 2022-01-01 DIAGNOSIS — Z510 Encounter for antineoplastic radiation therapy: Secondary | ICD-10-CM | POA: Diagnosis not present

## 2022-01-02 ENCOUNTER — Other Ambulatory Visit: Payer: Self-pay

## 2022-01-02 ENCOUNTER — Ambulatory Visit
Admission: RE | Admit: 2022-01-02 | Discharge: 2022-01-02 | Disposition: A | Discharge status: Home / Self Care | Payer: Medicare HMO | Source: Ambulatory Visit | Attending: Radiation Oncology | Admitting: Radiation Oncology

## 2022-01-02 DIAGNOSIS — C61 Malignant neoplasm of prostate: Secondary | ICD-10-CM | POA: Diagnosis not present

## 2022-01-02 DIAGNOSIS — Z510 Encounter for antineoplastic radiation therapy: Secondary | ICD-10-CM | POA: Diagnosis not present

## 2022-01-03 ENCOUNTER — Ambulatory Visit
Admission: RE | Admit: 2022-01-03 | Discharge: 2022-01-03 | Disposition: A | Discharge status: Home / Self Care | Payer: Medicare HMO | Source: Ambulatory Visit | Attending: Radiation Oncology | Admitting: Radiation Oncology

## 2022-01-03 ENCOUNTER — Other Ambulatory Visit: Payer: Self-pay

## 2022-01-03 DIAGNOSIS — Z510 Encounter for antineoplastic radiation therapy: Secondary | ICD-10-CM | POA: Diagnosis not present

## 2022-01-03 DIAGNOSIS — C61 Malignant neoplasm of prostate: Secondary | ICD-10-CM | POA: Diagnosis not present

## 2022-01-04 ENCOUNTER — Ambulatory Visit
Admission: RE | Admit: 2022-01-04 | Discharge: 2022-01-04 | Disposition: A | Discharge status: Home / Self Care | Payer: Medicare HMO | Source: Ambulatory Visit | Attending: Radiation Oncology | Admitting: Radiation Oncology

## 2022-01-04 DIAGNOSIS — Z510 Encounter for antineoplastic radiation therapy: Secondary | ICD-10-CM | POA: Diagnosis not present

## 2022-01-04 DIAGNOSIS — C61 Malignant neoplasm of prostate: Secondary | ICD-10-CM | POA: Diagnosis not present

## 2022-01-05 ENCOUNTER — Other Ambulatory Visit: Payer: Self-pay

## 2022-01-05 ENCOUNTER — Ambulatory Visit
Admission: RE | Admit: 2022-01-05 | Discharge: 2022-01-05 | Disposition: A | Discharge status: Home / Self Care | Payer: Medicare HMO | Source: Ambulatory Visit | Attending: Radiation Oncology | Admitting: Radiation Oncology

## 2022-01-05 DIAGNOSIS — C61 Malignant neoplasm of prostate: Secondary | ICD-10-CM | POA: Diagnosis not present

## 2022-01-05 DIAGNOSIS — Z510 Encounter for antineoplastic radiation therapy: Secondary | ICD-10-CM | POA: Diagnosis not present

## 2022-01-08 ENCOUNTER — Other Ambulatory Visit: Payer: Self-pay

## 2022-01-08 ENCOUNTER — Ambulatory Visit
Admission: RE | Admit: 2022-01-08 | Discharge: 2022-01-08 | Disposition: A | Discharge status: Home / Self Care | Payer: Medicare HMO | Source: Ambulatory Visit | Attending: Radiation Oncology | Admitting: Radiation Oncology

## 2022-01-08 DIAGNOSIS — C61 Malignant neoplasm of prostate: Secondary | ICD-10-CM | POA: Diagnosis not present

## 2022-01-08 DIAGNOSIS — Z510 Encounter for antineoplastic radiation therapy: Secondary | ICD-10-CM | POA: Diagnosis not present

## 2022-01-09 ENCOUNTER — Ambulatory Visit
Admission: RE | Admit: 2022-01-09 | Discharge: 2022-01-09 | Disposition: A | Discharge status: Home / Self Care | Payer: Medicare HMO | Source: Ambulatory Visit | Attending: Radiation Oncology | Admitting: Radiation Oncology

## 2022-01-09 DIAGNOSIS — C61 Malignant neoplasm of prostate: Secondary | ICD-10-CM | POA: Diagnosis not present

## 2022-01-09 DIAGNOSIS — Z510 Encounter for antineoplastic radiation therapy: Secondary | ICD-10-CM | POA: Diagnosis not present

## 2022-01-10 ENCOUNTER — Ambulatory Visit
Admission: RE | Admit: 2022-01-10 | Discharge: 2022-01-10 | Disposition: A | Discharge status: Home / Self Care | Payer: Medicare HMO | Source: Ambulatory Visit | Attending: Radiation Oncology | Admitting: Radiation Oncology

## 2022-01-10 ENCOUNTER — Other Ambulatory Visit: Payer: Self-pay

## 2022-01-10 DIAGNOSIS — C61 Malignant neoplasm of prostate: Secondary | ICD-10-CM | POA: Insufficient documentation

## 2022-01-10 DIAGNOSIS — Z510 Encounter for antineoplastic radiation therapy: Secondary | ICD-10-CM | POA: Diagnosis not present

## 2022-01-11 ENCOUNTER — Ambulatory Visit
Admission: RE | Admit: 2022-01-11 | Discharge: 2022-01-11 | Disposition: A | Discharge status: Home / Self Care | Payer: Medicare HMO | Source: Ambulatory Visit | Attending: Radiation Oncology | Admitting: Radiation Oncology

## 2022-01-11 DIAGNOSIS — Z510 Encounter for antineoplastic radiation therapy: Secondary | ICD-10-CM | POA: Diagnosis not present

## 2022-01-11 DIAGNOSIS — C61 Malignant neoplasm of prostate: Secondary | ICD-10-CM | POA: Diagnosis not present

## 2022-01-12 ENCOUNTER — Ambulatory Visit: Payer: Medicare HMO

## 2022-01-12 ENCOUNTER — Other Ambulatory Visit: Payer: Self-pay | Admitting: Urology

## 2022-01-12 ENCOUNTER — Telehealth: Payer: Self-pay | Admitting: Radiation Oncology

## 2022-01-12 MED ORDER — SOLIFENACIN SUCCINATE 5 MG PO TABS: 5.0000 mg | ORAL_TABLET | Freq: Every day | ORAL | 2 refills | Status: DC

## 2022-01-12 NOTE — Telephone Encounter (Signed)
Patient called to cancel treatment scheduled 3/3, stated he is not feeling well.  ?

## 2022-01-15 ENCOUNTER — Ambulatory Visit
Admission: RE | Admit: 2022-01-15 | Discharge: 2022-01-15 | Disposition: A | Discharge status: Home / Self Care | Payer: Medicare HMO | Source: Ambulatory Visit | Attending: Radiation Oncology | Admitting: Radiation Oncology

## 2022-01-15 ENCOUNTER — Other Ambulatory Visit: Payer: Self-pay

## 2022-01-15 DIAGNOSIS — Z510 Encounter for antineoplastic radiation therapy: Secondary | ICD-10-CM | POA: Diagnosis not present

## 2022-01-15 DIAGNOSIS — C61 Malignant neoplasm of prostate: Secondary | ICD-10-CM | POA: Diagnosis not present

## 2022-01-16 ENCOUNTER — Ambulatory Visit
Admission: RE | Admit: 2022-01-16 | Discharge: 2022-01-16 | Disposition: A | Discharge status: Home / Self Care | Payer: Medicare HMO | Source: Ambulatory Visit | Attending: Radiation Oncology | Admitting: Radiation Oncology

## 2022-01-16 DIAGNOSIS — Z510 Encounter for antineoplastic radiation therapy: Secondary | ICD-10-CM | POA: Diagnosis not present

## 2022-01-16 DIAGNOSIS — C61 Malignant neoplasm of prostate: Secondary | ICD-10-CM | POA: Diagnosis not present

## 2022-01-17 ENCOUNTER — Other Ambulatory Visit: Payer: Self-pay

## 2022-01-17 ENCOUNTER — Ambulatory Visit
Admission: RE | Admit: 2022-01-17 | Discharge: 2022-01-17 | Disposition: A | Discharge status: Home / Self Care | Payer: Medicare HMO | Source: Ambulatory Visit | Attending: Radiation Oncology | Admitting: Radiation Oncology

## 2022-01-17 DIAGNOSIS — Z510 Encounter for antineoplastic radiation therapy: Secondary | ICD-10-CM | POA: Diagnosis not present

## 2022-01-17 DIAGNOSIS — C61 Malignant neoplasm of prostate: Secondary | ICD-10-CM | POA: Diagnosis not present

## 2022-01-18 ENCOUNTER — Ambulatory Visit
Admission: RE | Admit: 2022-01-18 | Discharge: 2022-01-18 | Disposition: A | Discharge status: Home / Self Care | Payer: Medicare HMO | Source: Ambulatory Visit | Attending: Radiation Oncology | Admitting: Radiation Oncology

## 2022-01-18 DIAGNOSIS — C61 Malignant neoplasm of prostate: Secondary | ICD-10-CM | POA: Diagnosis not present

## 2022-01-18 DIAGNOSIS — Z510 Encounter for antineoplastic radiation therapy: Secondary | ICD-10-CM | POA: Diagnosis not present

## 2022-01-19 ENCOUNTER — Encounter: Payer: Self-pay | Admitting: Cardiology

## 2022-01-19 ENCOUNTER — Ambulatory Visit
Admission: RE | Admit: 2022-01-19 | Discharge: 2022-01-19 | Disposition: A | Discharge status: Home / Self Care | Payer: Medicare HMO | Source: Ambulatory Visit | Attending: Radiation Oncology | Admitting: Radiation Oncology

## 2022-01-19 ENCOUNTER — Other Ambulatory Visit: Payer: Self-pay

## 2022-01-19 ENCOUNTER — Ambulatory Visit: Payer: Medicare HMO | Admitting: Cardiology

## 2022-01-19 VITALS — BP 138/60 | HR 65 | Ht 73.0 in | Wt 200.0 lb

## 2022-01-19 DIAGNOSIS — Q230 Congenital stenosis of aortic valve: Secondary | ICD-10-CM | POA: Insufficient documentation

## 2022-01-19 DIAGNOSIS — E785 Hyperlipidemia, unspecified: Secondary | ICD-10-CM

## 2022-01-19 DIAGNOSIS — F17210 Nicotine dependence, cigarettes, uncomplicated: Secondary | ICD-10-CM

## 2022-01-19 DIAGNOSIS — N1831 Chronic kidney disease, stage 3a: Secondary | ICD-10-CM

## 2022-01-19 DIAGNOSIS — I6523 Occlusion and stenosis of bilateral carotid arteries: Secondary | ICD-10-CM

## 2022-01-19 DIAGNOSIS — Z510 Encounter for antineoplastic radiation therapy: Secondary | ICD-10-CM | POA: Diagnosis not present

## 2022-01-19 DIAGNOSIS — C61 Malignant neoplasm of prostate: Secondary | ICD-10-CM | POA: Diagnosis not present

## 2022-01-19 DIAGNOSIS — R7303 Prediabetes: Secondary | ICD-10-CM

## 2022-01-19 MED ORDER — ASPIRIN EC 81 MG PO TBEC: 81.0000 mg | DELAYED_RELEASE_TABLET | Freq: Every day | ORAL | 3 refills | Status: DC

## 2022-01-19 NOTE — Patient Instructions (Addendum)
Medication Instructions:  ?Your physician has recommended you make the following change in your medication:  ? ?START: Aspirin 81 mg daily ? ?*If you need a refill on your cardiac medications before your next appointment, please call your pharmacy* ? ? ?Lab Work: ?Your physician recommends that you return for lab work in:  ? ?Labs today: Direct LDL ? ?If you have labs (blood work) drawn today and your tests are completely normal, you will receive your results only by: ?MyChart Message (if you have MyChart) OR ?A paper copy in the mail ?If you have any lab test that is abnormal or we need to change your treatment, we will call you to review the results. ? ? ?Testing/Procedures: ?None ? ? ?Follow-Up: ?At [facility], you and your health needs are our priority.  As part of our continuing mission to provide you with exceptional heart care, we have created designated Provider Care Teams.  These Care Teams include your primary Cardiologist (physician) and Advanced Practice Providers (APPs -  Physician Assistants and Nurse Practitioners) who all work together to provide you with the care you need, when you need it. ? ?We recommend signing up for the patient portal called "MyChart".  Sign up information is provided on this After Visit Summary.  MyChart is used to connect with patients for Virtual Visits (Telemedicine).  Patients are able to view lab/test results, encounter notes, upcoming appointments, etc.  Non-urgent messages can be sent to your provider as well.   ?To learn more about what you can do with MyChart, go to [email or web address].   ? ?Your next appointment:   ?6 month(s) ? ?The format for your next appointment:   ?In Person ? ?Provider:   ?Jenne Campus, MD  ? ? ?Other Instructions ?None ? ?

## 2022-01-19 NOTE — Progress Notes (Signed)
Cardiology Office Note:    Date:  01/19/2022   ID:  Andrew [name], DOB 1951/01/30, MRN [id number]  PCP:  Andrew Pollen, MD  Cardiologist:  Andrew Campus, MD    Referring MD: Andrew [name], *   Chief Complaint  Patient presents with   Follow-up  I am doing fine  History of Present Illness:   Carotic arterial stenosis.  He does have Andrew [name] is a 71 y.o. male with past medical history significant for 60 to 70% stenosis in the left internal carotic artery based on cardiac ultrasound from September of last year, multiple risk factors for coronary artery disease which include dyslipidemia as well as smoking.  He does have history of prostate cancer which is aggressively treated with radiation therapy [name] now.  His Gleason scale is 3+3. Comes today to my office for follow-up.  Overall he seems to be doing well.  Complain of having some complication of radiation therapy including urinary urgency as well as some burning while urinating.  Denies have any cardiac complaints.  There is no chest pain tightness squeezing pressure burning chest.  Past Medical History:  Diagnosis Date   Allergic rhinitis    Anal lesion    polypoid   Atherosclerosis of aorta (HCC)    Carotid artery stenosis, asymptomatic, bilateral 11/2017   last carotid ultrasound in epic 07-19-2021  [name] ECA >50% and left ICA 40-59% stenosis   CKD (chronic kidney disease), stage III ([place])    followed by pcp;  previously followed by nephrologist--- Andrew [name]  (per pt lov appprox 2020)   DOE (dyspnea on exertion)    10-27-2021  pt stated has normal doe due to COPD, but past couple days it has even more so, Andrew [name] started pt on prednisone today   ED (erectile dysfunction)    GERD (gastroesophageal reflux disease)    History of adenomatous polyp of colon    Hyperplasia of prostate with lower urinary tract symptoms (LUTS)    Hypertension    followed by pcp;   nuclear stress test 07-13-2021 low  risk no ischemia w/ nuclear ef 62%;  echo 07-13-2021 ef 55-60%, aortic valve w/ mild AR with mild to moderate sclerosis, RVSP 36.35mHg   Peripheral vascular disease ([place]    Pre-diabetes    Prostate cancer ([facility] 2018   urologist-- Andrew [name]  first dx 02/ 2018 Gleason 3+3, PSA 6.4 active survillance;  MRI fusion bx 04-21-2021, Gleason 3+4, PSA 20.5   Renal cyst, acquired    bilateral   Stage 2 moderate COPD by GOLD classification ([facility]    Andrew [name]  emphysema still smoking ;  pt does not have oxygen (10-27-2021  pt stated trying to quit last cigarette in 7 days, had cut done to 3-4 cig per week)   Wears dentures    full upper and partial lower   Wears glasses     Past Surgical History:  Procedure Laterality Date   COLONOSCOPY WITH PROPOFOL  03/28/2021   by Andrew Andrew [name]  GOLD SEED IMPLANT N/A 11/30/2021   Procedure: GOLD SEED IMPLANT;  Surgeon: Andrew Gustin MD;  Location: [facility];  Service: Urology;  Laterality: N/A;   NO PAST SURGERIES     SPACE OAR INSTILLATION N/A 11/30/2021   Procedure: SPACE OAR INSTILLATION;  Surgeon: Andrew Gustin MD;  Location: [facility];  Service: Urology;  Laterality: N/A;   TRANSANAL EXCISION OF RECTAL MASS N/A 10/30/2021   Procedure: TRANSANAL EXCISION OF  ANAL CANAL POLYPOID LESION;  Surgeon: Andrew Roup, MD;  Location: [facility];  Service: General;  Laterality: N/A;    Current Medications: Current Meds  Medication Sig   acetaminophen (TYLENOL) 500 MG tablet Take 1,000 mg by mouth every 6 (six) hours as needed for moderate pain.   albuterol (PROVENTIL HFA;VENTOLIN HFA) 108 (90 Base) MCG/ACT inhaler Inhale 2 puffs into the lungs every 4 (four) hours as needed for wheezing or shortness of breath (cough, shortness of breath or wheezing.).   alfuzosin (UROXATRAL) 10 MG 24 hr tablet Take 1 tablet (10 mg total) by mouth in the morning and at bedtime.   budesonide-formoterol (SYMBICORT) 160-4.5 MCG/ACT inhaler  Inhale 2 puffs into the lungs 2 (two) times daily.   cetirizine (ZYRTEC) 10 MG tablet TAKE 1 TABLET EVERY DAY (Patient taking differently: Take 10 mg by mouth daily. TAKE 1 TABLET EVERY DAY)   diltiazem (DILACOR XR) 240 MG 24 hr capsule TAKE 1 CAPSULE EVERY DAY (Patient taking differently: Take 240 mg by mouth daily.)   fluticasone (FLONASE) 50 MCG/ACT nasal spray Place 1 spray into both nostrils daily. (Patient taking differently: Place 3 sprays into both nostrils daily as needed for allergies.)   Guaifenesin (MUCINEX MAXIMUM STRENGTH) 1200 MG TB12 Take 1,200 mg by mouth 2 (two) times daily as needed (congestion).   olmesartan (BENICAR) 40 MG tablet Take 1 tablet (40 mg total) by mouth daily.   omeprazole (PRILOSEC OTC) 20 MG tablet Take 20 mg by mouth daily as needed (acid reflux).   rosuvastatin (CRESTOR) 10 MG tablet Take 1 tablet (10 mg total) by mouth daily.   solifenacin (VESICARE) 5 MG tablet Take 1 tablet (5 mg total) by mouth daily.   tadalafil (CIALIS) 20 MG tablet Take 1 tablet (20 mg total) by mouth daily as needed for erectile dysfunction.   traMADol (ULTRAM) 50 MG tablet Take 1 tablet (50 mg total) by mouth every 6 (six) hours as needed. (Patient taking differently: Take 50 mg by mouth every 6 (six) hours as needed for moderate pain or severe pain.)   triamcinolone (KENALOG) 0.1 % APPLY 1 APPLICATION TOPICALLY 2 (TWO) TIMES DAILY. (Patient taking differently: Apply 1 application. topically 2 (two) times daily as needed.)     Allergies:   Lisinopril and Lisinopril-hydrochlorothiazide   Social History   Socioeconomic History   Marital status: Married    Spouse name: Not on file   Number of children: 1   Years of education: Not on file   Highest education level: Not on file  Occupational History   Not on file  Tobacco Use   Smoking status: Some Days    Years: 52.00    Types: Cigarettes   Smokeless tobacco: Never   Tobacco comments:    10-27-2021  pt stated trying to quit,  last cigarette 7 days ago,  had gotten down to 3-4 cig per week (pt started smoking age 49)  Vaping Use   Vaping Use: Never used  Substance and Sexual Activity   Alcohol use: Not Currently    Comment: occasional   Drug use: Not Currently    Types: Marijuana, Cocaine    Comment: 10-27-2021  pt stated last cocine age 30s and last marijuana age early 16s   Sexual activity: Yes  Other Topics Concern   Not on file  Social History Narrative   Not on file   Social Determinants of Health   Financial Resource Strain: Not on file  Food Insecurity: Not on  file  Transportation Needs: Not on file  Physical Activity: Not on file  Stress: Not on file  Social Connections: Not on file     Family History: The patient's family history is negative for Colon cancer, Esophageal cancer, Rectal cancer, Stomach cancer, and Colon polyps. ROS:   Please see the history of present illness.    All 14 point review of systems negative except as described per history of present illness  EKGs/Labs/Other Studies Reviewed:      Recent Labs: 10/30/2021: Hemoglobin 13.9 11/28/2021: BUN 20; Creatinine, Ser 1.77; Potassium 4.6; Sodium 141  Recent Lipid Panel    Component Value Date/Time   CHOL 263 (H) 10/02/2019 1115   TRIG 127 10/02/2019 1115   HDL 60 10/02/2019 1115   CHOLHDL 4.4 10/02/2019 1115   CHOLHDL 4.3 09/14/2016 1306   VLDL 15 09/14/2016 1306   LDLCALC 180 (H) 10/02/2019 1115   LDLDIRECT 146 (H) 06/23/2021 1641    Physical Exam:    VS:  BP 138/60 (BP Location: Left Arm, Patient Position: Sitting)    Pulse 65    Ht '6\' 1"'$  (1.854 m)    Wt 200 lb (90.7 kg)    SpO2 94%    BMI 26.39 kg/m     Wt Readings from Last 3 Encounters:  01/19/22 200 lb (90.7 kg)  11/28/21 200 lb (90.7 kg)  10/30/21 203 lb 6.4 oz (92.3 kg)     GEN:  Well nourished, well developed in no acute distress HEENT: Normal NECK: No JVD; No carotid bruits LYMPHATICS: No lymphadenopathy CARDIAC: RRR, no murmurs, no rubs, no  gallops RESPIRATORY:  Clear to auscultation without rales, wheezing or rhonchi  ABDOMEN: Soft, non-tender, non-distended MUSCULOSKELETAL:  No edema; No deformity  SKIN: Warm and dry LOWER EXTREMITIES: no swelling NEUROLOGIC:  Alert and oriented x 3 PSYCHIATRIC:  Normal affect   ASSESSMENT:    1. Carotid artery stenosis, asymptomatic, bilateral   2. Congenital supravalvular aortic stenosis   3. Chronic kidney disease (CKD) stage G3a/A2, moderately decreased glomerular filtration rate (GFR) between 45-59 mL/min/1.73 square meter and albuminuria creatinine ratio between 30-299 mg/g (HCC)   4. Hyperlipidemia LDL goal <70   5. Prediabetes   6. Cigarette smoker    PLAN:    In order of problems listed above:  Coronary artery stenosis, asymptomatic.  I will make sure he takes 1 baby aspirin every single day.  We will try to reduce his cholesterol.  In the future he will have to have repeated cardiac ultrasounds done which probably will be done in the summertime. Aortic stenosis: Last echocardiogram did not show that.  He did show significant calcification of the aortic valve without significant increase in velocity flow.  Continue monitoring Dyslipidemia I will ask him to have fasting lipid profile done today.  He is taking 10 mg of Crestor probably does not sufficient.  Last data I have from 2022 August with LDL 146 which clearly is not sufficient to control Smoking: Still a problem we spent at least 5 minutes talking about this and I stressed importance of quitting smoking.   Medication Adjustments/Labs and Tests Ordered: Current medicines are reviewed at length with the patient today.  Concerns regarding medicines are outlined above.  No orders of the defined types were placed in this encounter.  Medication changes: No orders of the defined types were placed in this encounter.   Signed, Park Liter, MD, [facility] 01/19/2022 1:20 PM    [place]

## 2022-01-19 NOTE — Addendum Note (Signed)
Addended by: Edwyna Shell I on: 01/19/2022 01:36 PM ? ? Modules accepted: Orders ? ?

## 2022-01-19 NOTE — Addendum Note (Signed)
Addended by: Edwyna Shell I on: 01/19/2022 01:29 PM ? ? Modules accepted: Orders ? ?

## 2022-01-20 LAB — SPECIMEN STATUS REPORT

## 2022-01-20 LAB — LDL CHOLESTEROL, DIRECT: LDL Direct: 87 mg/dL (ref 0–99)

## 2022-01-22 ENCOUNTER — Ambulatory Visit: Payer: Medicare HMO

## 2022-01-22 ENCOUNTER — Other Ambulatory Visit: Payer: Self-pay

## 2022-01-22 ENCOUNTER — Ambulatory Visit
Admission: RE | Admit: 2022-01-22 | Discharge: 2022-01-22 | Disposition: A | Discharge status: Home / Self Care | Payer: Medicare HMO | Source: Ambulatory Visit | Attending: Radiation Oncology | Admitting: Radiation Oncology

## 2022-01-22 DIAGNOSIS — C61 Malignant neoplasm of prostate: Secondary | ICD-10-CM | POA: Diagnosis not present

## 2022-01-22 DIAGNOSIS — Z510 Encounter for antineoplastic radiation therapy: Secondary | ICD-10-CM | POA: Diagnosis not present

## 2022-01-23 ENCOUNTER — Ambulatory Visit
Admission: RE | Admit: 2022-01-23 | Discharge: 2022-01-23 | Disposition: A | Discharge status: Home / Self Care | Payer: Medicare HMO | Source: Ambulatory Visit | Attending: Radiation Oncology | Admitting: Radiation Oncology

## 2022-01-23 ENCOUNTER — Ambulatory Visit: Payer: Medicare HMO

## 2022-01-23 ENCOUNTER — Encounter: Payer: Self-pay | Admitting: Urology

## 2022-01-23 DIAGNOSIS — C61 Malignant neoplasm of prostate: Secondary | ICD-10-CM | POA: Diagnosis not present

## 2022-01-23 DIAGNOSIS — Z510 Encounter for antineoplastic radiation therapy: Secondary | ICD-10-CM | POA: Diagnosis not present

## 2022-01-24 ENCOUNTER — Ambulatory Visit: Payer: Medicare HMO

## 2022-01-24 ENCOUNTER — Ambulatory Visit: Payer: Medicare HMO | Admitting: Physician Assistant

## 2022-01-24 ENCOUNTER — Other Ambulatory Visit: Payer: Self-pay

## 2022-01-24 DIAGNOSIS — E785 Hyperlipidemia, unspecified: Secondary | ICD-10-CM

## 2022-01-24 MED ORDER — ROSUVASTATIN CALCIUM 20 MG PO TABS
20.0000 mg | ORAL_TABLET | Freq: Every day | ORAL | 3 refills | Status: DC
Start: 2022-01-24 — End: 2022-08-02

## 2022-02-02 ENCOUNTER — Other Ambulatory Visit: Payer: Self-pay

## 2022-02-02 ENCOUNTER — Ambulatory Visit: Payer: Medicare HMO | Admitting: Physician Assistant

## 2022-02-02 ENCOUNTER — Telehealth: Payer: Self-pay

## 2022-02-02 VITALS — BP 170/76 | HR 83 | Ht 73.0 in | Wt 200.0 lb

## 2022-02-02 DIAGNOSIS — C61 Malignant neoplasm of prostate: Secondary | ICD-10-CM | POA: Diagnosis not present

## 2022-02-02 DIAGNOSIS — R351 Nocturia: Secondary | ICD-10-CM

## 2022-02-02 DIAGNOSIS — R350 Frequency of micturition: Secondary | ICD-10-CM | POA: Diagnosis not present

## 2022-02-02 DIAGNOSIS — N529 Male erectile dysfunction, unspecified: Secondary | ICD-10-CM

## 2022-02-02 LAB — MICROSCOPIC EXAMINATION
Bacteria, UA: NONE SEEN
Renal Epithel, UA: NONE SEEN /hpf
WBC, UA: NONE SEEN /hpf (ref 0–5)

## 2022-02-02 LAB — URINALYSIS, ROUTINE W REFLEX MICROSCOPIC
Bilirubin, UA: NEGATIVE
Glucose, UA: NEGATIVE
Leukocytes,UA: NEGATIVE
Nitrite, UA: NEGATIVE
Protein,UA: NEGATIVE
Specific Gravity, UA: 1.025 (ref 1.005–1.030)
Urobilinogen, Ur: 0.2 mg/dL (ref 0.2–1.0)
pH, UA: 5.5 (ref 5.0–7.5)

## 2022-02-02 LAB — BLADDER SCAN AMB NON-IMAGING: Scan Result: 0

## 2022-02-02 MED ORDER — TADALAFIL 20 MG PO TABS: 20.0000 mg | ORAL_TABLET | Freq: Every day | ORAL | 5 refills | Status: DC | PRN

## 2022-02-02 NOTE — Telephone Encounter (Signed)
-----   Message from Reynaldo Minium, [place] sent at 02/02/2022 10:29 AM EDT ----- ?Please have Andrew [name] come by the office and get a urinal for measurement of night time voiding. He needs to write down each voiding amount and keep a voiding diary at night only for 2 weeks to measure how much output he has each time he voids. Please have him drop off the diary at our office for review. This will help [place] assess whether he needs sleep apnea study. ? ?

## 2022-02-02 NOTE — Progress Notes (Signed)
post void residual =0mL 

## 2022-02-02 NOTE — Telephone Encounter (Signed)
Patient is aware of instructions and will pick up urinal on Monday.  ?

## 2022-02-02 NOTE — Telephone Encounter (Signed)
Left VM for patient to return call, waiting for response. ?

## 2022-02-02 NOTE — Progress Notes (Signed)
? ?Assessment: ?1. Prostate cancer ([place])   ?2. Nocturia   ?3. Erectile dysfunction, unspecified erectile dysfunction type   ?4. Urinary frequency   ? ? ?Plan: ?Discussed medications and treatment plan at length with the patient.  He will continue current prescriptions of alfuzosin 10 mg twice daily as well as Solifenacin 5 mg daily for his bladder spasms.  Tadalafil was refilled.  We will have the patient keep a nighttime voiding diary with measurement of his output.  Pending results, will consider sleep apnea study.  Patient will follow-up in 5 months for PSA then office visit with Dr. Alyson Ingles.  Follow-up with radiation oncology as scheduled. ? ?Chief Complaint: ?FU prostate CA and ED ? ?HPI: ?02/02/22 ?Andrew [name] is a 71 y.o. male who presents for continued evaluation of prostate cancer, urinary frequency, erectile dysfunction.  He completed radiation therapy last week and states bladder spasms and urgency have improved significantly after addition of Vesicare prescribed by radiation oncology.  He continues on Uroxatrol for history of LUTS.  Overall, he is extremely happy with his progress and his only complaint today is persistent urinary frequency primarily at night with 10-12 voids on a regular basis.  No gross hematuria, no pain.  Additionally, patient requests refill of tadalafil . ?IPSS = 22, Q OL = 6, medications have been helpful. ?Urine is clear today ?PVR= 44m ? ?12/13/21 ?Andrew SClapperis a 89yohere for followup for prostate cancer. He underwent fiducial markers and SpaceOAR 2 weeks ago. He denies any worsening LUTS. He is currently on uroxatral '10mg'$  daily. No perineal pain. He is scheduled to start radition this week.  ? ?Portions of the above documentation were copied from a prior visit for review purposes only. ? ?Allergies: ?No Active Allergies ? ?PMH: ?Past Medical History:  ?Diagnosis Date  ? Allergic rhinitis   ? Anal lesion   ? polypoid  ? Atherosclerosis of aorta ([facility]   ? Carotid artery  stenosis, asymptomatic, bilateral 11/2017  ? last carotid ultrasound in epic 07-19-2021  right ECA >50% and left ICA 40-59% stenosis  ? CKD (chronic kidney disease), stage III ([facility]   ? followed by pcp;  previously followed by nephrologist--- dr cAmbrose Pancoast (per pt lov appprox 2020)  ? DOE (dyspnea on exertion)   ? 10-27-2021  pt stated has normal doe due to COPD, but past couple days it has even more so, Dr WMelvyn Novasstarted pt on prednisone today  ? ED (erectile dysfunction)   ? GERD (gastroesophageal reflux disease)   ? History of adenomatous polyp of colon   ? Hyperplasia of prostate with lower urinary tract symptoms (LUTS)   ? Hypertension   ? followed by pcp;   nuclear stress test 07-13-2021 low risk no ischemia w/ nuclear ef 62%;  echo 07-13-2021 ef 55-60%, aortic valve w/ mild AR with mild to moderate sclerosis, RVSP 36.637mg  ? Peripheral vascular disease ([facility]  ? Pre-diabetes   ? [facility]  ? urologist-- dr mcAlyson Ingles first dx 02/ 2018 Gleason 3+3, PSA 6.4 active survillance;  MRI fusion bx 04-21-2021, Gleason 3+4, PSA 20.5  ? Renal cyst, acquired   ? bilateral  ? Stage 2 moderate COPD by GOLD classification ([facility]  ? pulmonologist--- dr weMelvyn Novas emphysema still smoking ;  pt does not have oxygen (10-27-2021  pt stated trying to quit last cigarette in 7 days, had cut done to 3-4 cig per week)  ? Wears dentures   ? full upper  and partial lower  ? Wears glasses   ? ? ?PSH: ?Past Surgical History:  ?Procedure Laterality Date  ? COLONOSCOPY WITH PROPOFOL  03/28/2021  ? by dr Ardis Hughs  ? GOLD SEED IMPLANT N/A 11/30/2021  ? Procedure: GOLD SEED IMPLANT;  Surgeon: Cleon Gustin, MD;  Location: [facility];  Service: Urology;  Laterality: N/A;  ? NO PAST SURGERIES    ? SPACE OAR INSTILLATION N/A 11/30/2021  ? Procedure: SPACE OAR INSTILLATION;  Surgeon: Cleon Gustin, MD;  Location: [facility];  Service: Urology;  Laterality: N/A;  ? TRANSANAL EXCISION OF RECTAL MASS N/A 10/30/2021  ? Procedure:  TRANSANAL EXCISION OF ANAL CANAL POLYPOID LESION;  Surgeon: Ileana Roup, MD;  Location: [facility];  Service: General;  Laterality: N/A;  ? ? ?SH: ?Social History  ? ?Tobacco Use  ? Smoking status: Some Days  ?  Years: 52.00  ?  Types: Cigarettes  ? Smokeless tobacco: Never  ? Tobacco comments:  ?  10-27-2021  pt stated trying to quit, last cigarette 7 days ago,  had gotten down to 3-4 cig per week (pt started smoking age 18)  ?Vaping Use  ? Vaping Use: Never used  ?Substance Use Topics  ? Alcohol use: Not Currently  ?  Comment: occasional  ? Drug use: Not Currently  ?  Types: Marijuana, Cocaine  ?  Comment: 10-27-2021  pt stated last cocine age 8s and last marijuana age early 63s  ? ? ?PE: ?BP (!) 170/76   Pulse 83   Ht '6\' 1"'$  (1.854 m)   Wt 200 lb (90.7 kg)   BMI 26.39 kg/m?  ?GENERAL APPEARANCE:  Well appearing, well developed, well nourished, NAD ?HEENT:  Atraumatic, normocephalic ?NECK:  Supple. Trachea midline ?ABDOMEN:  Soft, non-tender ?EXTREMITIES:  Moves all extremities well, no edema ?NEUROLOGIC:  Alert and oriented x 3, normal gait ?MENTAL STATUS:  appropriate ?SKIN:  Warm, dry, and intact ? ? ?Results: ?Laboratory Data: ?Lab Results  ?Component Value Date  ? WBC 6.3 07/08/2018  ? HGB 13.9 10/30/2021  ? HCT 41.0 10/30/2021  ? MCV 85.3 07/08/2018  ? PLT 268.0 07/08/2018  ? ? ?Lab Results  ?Component Value Date  ? CREATININE 1.77 (H) 11/28/2021  ? ? ?Lab Results  ?Component Value Date  ? PSA 5.4 (H) 09/14/2016  ? PSA 5.58 (H) 01/24/2016  ? ? ?Lab Results  ?Component Value Date  ? HGBA1C 5.6 10/02/2019  ? ? ?Urinalysis ?   ?Component Value Date/Time  ? APPEARANCEUR Clear 12/13/2021 1526  ? GLUCOSEU Negative 12/13/2021 1526  ? BILIRUBINUR Negative 12/13/2021 1526  ? KETONESUR negative 10/17/2017 1615  ? PROTEINUR Negative 12/13/2021 1526  ? UROBILINOGEN 0.2 10/17/2017 1615  ? NITRITE Negative 12/13/2021 1526  ? LEUKOCYTESUR Negative 12/13/2021 1526  ? ? ?Lab Results  ?Component  Value Date  ? LABMICR See below: 12/13/2021  ? [place] None seen 12/13/2021  ? LABEPIT None seen 12/13/2021  ? MUCUS Present 06/09/2021  ? BACTERIA None seen 12/13/2021  ? ? ?Pertinent Imaging: ? ?No results found for this or any previous visit. ? ?No results found for this or any previous visit. ? ?No results found for this or any previous visit. ? ?No results found for this or any previous visit. ? ?Results for orders placed during the hospital encounter of 05/27/20 ? ?US RENAL ? ?Narrative ?CLINICAL DATA:  CKD stage 3 ? ?EXAM: ?RENAL / URINARY TRACT ULTRASOUND COMPLETE ? ?COMPARISON:  Chest CT acquired on September 25, 2019, limited ?assessment of the kidneys at that time. ? ?FINDINGS: ?Right Kidney: ? ?Renal measurements: 10.7 x 4.0 x 4.5 cm = volume: 102 mL. Small ?hypoechoic to anechoic focus in the upper pole of the RIGHT kidney ?shows some central increased echogenicity and perhaps multi septate ?changes. ? ?3.9 x 3.3 x 2.7 cm anechoic cyst with adjacent smaller cystic area ?1.2 x 1.3 x 1.3 cm cannot be classified as a simple cyst and does ?not show overt signs of flow. No hydronephrosis. Decreased ?corticomedullary differentiation. ? ?Left Kidney: ? ?Renal measurements: 10.6 x 5.7 x 4.8 cm = volume: 151 mL. Decreased ?corticomedullary differentiation without hydronephrosis. Small ?mildly complex areas with multi septate or spongiform appearance in ?the upper pole and interpolar portion largest measuring 1.6 x 1.4 x ?1.5 cm with smaller areas, less than 10 also showing some cystic ?characteristics. ? ?Bladder: ? ?Appears normal for degree of bladder distention. ? ?Other: ? ?None. ? ?IMPRESSION: ?1. Decreased corticomedullary differentiation without ?hydronephrosis. Findings can be seen in the setting of medical renal ?disease. ?2. Multiple cystic lesions in the bilateral kidneys without renal ?enlargement. Some of these areas display internal ?complexity/variable echogenicity. MRI may be helpful for  further ?evaluation to exclude the presence of enhancing components/small ?cystic renal neoplasm. ? ?These results will be called to the ordering clinician or ?representative by the Radiologist Assistant, and communication ?d

## 2022-02-05 ENCOUNTER — Encounter: Payer: Self-pay | Admitting: *Deleted

## 2022-02-05 MED ORDER — TRIAMCINOLONE ACETONIDE 0.1 % EX CREA: 1.0000 "application " | TOPICAL_CREAM | Freq: Two times a day (BID) | CUTANEOUS | 0 refills | Status: DC

## 2022-02-15 ENCOUNTER — Telehealth: Payer: Self-pay | Admitting: Emergency Medicine

## 2022-02-15 NOTE — Telephone Encounter (Signed)
LVM for pt to rtn my call to schedle AWV with [facility]. Please schedule this appt if pt calls the office.  ?

## 2022-02-26 ENCOUNTER — Telehealth: Payer: Self-pay | Admitting: *Deleted

## 2022-02-26 NOTE — Telephone Encounter (Signed)
RETURNED PATIENT'S PHONE CALL, SPOKE WITH PATIENT. ?

## 2022-02-27 ENCOUNTER — Encounter: Payer: Self-pay | Admitting: Urology

## 2022-02-27 NOTE — Progress Notes (Signed)
Telephone appointment. I verified patient identity and began nursing interview. Patient states "His urinary urgency has greatly improved." Overall patient is doing well. No issues reported at this time. ? ?Meaningful use complete. ?I-PSS score of 4 (mild). ?No urinary management medications. ?Urology appointment- None ? ?Reminded patient of his 11:30am-02/28/22 telephone appointment w/ Ashlyn Bruning PA-C. I left my extension [phone] in case patient needs anything. Patient verbalized understanding of information. ? ?Patient contact [phone] ? ?

## 2022-02-28 ENCOUNTER — Ambulatory Visit
Admission: RE | Admit: 2022-02-28 | Discharge: 2022-02-28 | Disposition: A | Discharge status: Home / Self Care | Payer: Medicare HMO | Source: Ambulatory Visit | Attending: Urology | Admitting: Urology

## 2022-02-28 DIAGNOSIS — C61 Malignant neoplasm of prostate: Secondary | ICD-10-CM

## 2022-02-28 NOTE — Progress Notes (Signed)
?  Radiation Oncology         (336) [phone] ?________________________________ ? ?Name: Andrew [name] MRN: [id number]  ?Date: 01/23/2022  DOB: 1951/09/18 ? ?End of Treatment Note ? ?Diagnosis:   71 y.o. gentleman with Stage T1c adenocarcinoma of the prostate with Gleason score of 3+4, and PSA of 20.5.    ? ?Indication for treatment:  Curative, Definitive Radiotherapy      ? ?Radiation treatment dates:   12/14/21 - 01/23/22 ? ?Site/dose:   The prostate was treated to 70 Gy in 28 fractions of 2.5 Gy ? ?Beams/energy:   The patient was treated with IMRT using volumetric arc therapy delivering 6 MV X-rays to clockwise and counterclockwise circumferential arcs with a 90 degree collimator offset to avoid dose scalloping.  Image guidance was performed with daily cone beam CT prior to each fraction to align to gold markers in the prostate and assure proper bladder and rectal fill volumes.  Immobilization was achieved with BodyFix custom mold. ? ?Narrative: The patient tolerated radiation treatment relatively well with only minor urinary irritation and modest fatigue.  He did report increased nocturia, daytime frequency and urgency which improved with Uroxatrol and Vesicare daily as prescribed. ? ?Plan: The patient has completed radiation treatment. He will return to radiation oncology clinic for routine followup in one month. I advised him to call or return sooner if he has any questions or concerns related to his recovery or treatment. ?________________________________ ? ?Sheral Apley Tammi Klippel, M.D.  ?

## 2022-02-28 NOTE — Progress Notes (Signed)
?Radiation Oncology         (336) [phone] ?________________________________ ? ?Name: Andrew [name] MRN: [id number]  ?Date: 02/28/2022  DOB: May 10, 1951 ? ?Post Treatment Note ? ?CC: Sagardia, Ines Bloomer, MD  Alyson Ingles Candee Furbish, MD ? ?Diagnosis:   71 y.o. gentleman with Stage T1c adenocarcinoma of the prostate with Gleason score of 3+4, and PSA of 20.5. ? ?Interval Since Last Radiation:  5 weeks  ?12/14/21 - 01/23/22: The prostate was treated to 70 Gy in 28 fractions of 2.5 Gy ? ?Narrative:  I spoke with the patient to conduct his routine scheduled 1 month follow up visit via telephone to spare the patient unnecessary potential exposure in the healthcare setting during the current COVID-19 pandemic.  The patient was notified in advance and gave permission to proceed with this visit format. ? ?He tolerated radiation treatment relatively well with only minor urinary irritation and modest fatigue.  He did report increased nocturia, daytime frequency and urgency which improved with Uroxatrol and Vesicare daily as prescribed.                             ? ?On review of systems, the patient states that he is doing well in general and is currently without complaints.  He has noticed significant improvement in the nocturia as well as daytime frequency and urgency.  He is no longer taking the Vesicare but has continued taking the Uroxatrol daily and reports that his LUTS are now very manageable.  His current IPSS score is 4, indicating mild urinary symptoms only.  He reports a healthy appetite and is maintaining his weight.  He denies abdominal pain, nausea, vomiting, diarrhea or constipation.  He has not noticed any significant impact on his energy level and overall, is quite pleased with his progress to date. ? ?ALLERGIES:  has no active allergies. ? ?Meds: ?Current Outpatient Medications  ?Medication Sig Dispense Refill  ? acetaminophen (TYLENOL) 500 MG tablet Take 1,000 mg by mouth every 6 (six) hours as needed for  moderate pain.    ? albuterol (PROVENTIL HFA;VENTOLIN HFA) 108 (90 Base) MCG/ACT inhaler Inhale 2 puffs into the lungs every 4 (four) hours as needed for wheezing or shortness of breath (cough, shortness of breath or wheezing.). 1 Inhaler 11  ? alfuzosin (UROXATRAL) 10 MG 24 hr tablet Take 1 tablet (10 mg total) by mouth in the morning and at bedtime. 90 tablet 3  ? aspirin EC 81 MG tablet Take 1 tablet (81 mg total) by mouth daily. Swallow whole. 90 tablet 3  ? budesonide-formoterol (SYMBICORT) 160-4.5 MCG/ACT inhaler Inhale 2 puffs into the lungs 2 (two) times daily.    ? cetirizine (ZYRTEC) 10 MG tablet TAKE 1 TABLET EVERY DAY (Patient taking differently: Take 10 mg by mouth daily. TAKE 1 TABLET EVERY DAY) 90 tablet 4  ? diltiazem (DILACOR XR) 240 MG 24 hr capsule TAKE 1 CAPSULE EVERY DAY (Patient taking differently: Take 240 mg by mouth daily.) 90 capsule 3  ? fluticasone (FLONASE) 50 MCG/ACT nasal spray Place 1 spray into both nostrils daily. (Patient taking differently: Place 3 sprays into both nostrils daily as needed for allergies.) 32 g 3  ? Guaifenesin (MUCINEX MAXIMUM STRENGTH) 1200 MG TB12 Take 1,200 mg by mouth 2 (two) times daily as needed (congestion).    ? olmesartan (BENICAR) 40 MG tablet Take 1 tablet (40 mg total) by mouth daily. 90 tablet 3  ? omeprazole (PRILOSEC OTC) 20 MG  tablet Take 20 mg by mouth daily as needed (acid reflux).    ? rosuvastatin (CRESTOR) 20 MG tablet Take 1 tablet (20 mg total) by mouth daily. 90 tablet 3  ? solifenacin (VESICARE) 5 MG tablet Take 1 tablet (5 mg total) by mouth daily. 30 tablet 2  ? tadalafil (CIALIS) 20 MG tablet Take 1 tablet (20 mg total) by mouth daily as needed for erectile dysfunction. 10 tablet 5  ? traMADol (ULTRAM) 50 MG tablet Take 1 tablet (50 mg total) by mouth every 6 (six) hours as needed. (Patient taking differently: Take 50 mg by mouth every 6 (six) hours as needed for moderate pain or severe pain.) 15 tablet 0  ? triamcinolone cream  (KENALOG) 0.1 % Apply 1 application. topically 2 (two) times daily. 80 g 0  ? ?No current facility-administered medications for this encounter.  ? ? ?Physical Findings: ? vitals were not taken for this visit.  ?Pain Assessment ?Pain Score: 0-No pain/10 ?Unable to assess due to telephone follow-up visit format. ? ?Lab Findings: ?Lab Results  ?Component Value Date  ? WBC 6.3 07/08/2018  ? HGB 13.9 10/30/2021  ? HCT 41.0 10/30/2021  ? MCV 85.3 07/08/2018  ? PLT 268.0 07/08/2018  ? ? ? ?Radiographic Findings: ?No results found. ? ?Impression/Plan: ?1. 71 y.o. gentleman with Stage T1c adenocarcinoma of the prostate with Gleason score of 3+4, and PSA of 20.5. ?He will continue to follow up with urology for ongoing PSA determinations and has an appointment scheduled with Dr. Alyson Ingles in July 2023. He understands what to expect with regards to PSA monitoring going forward. I will look forward to following his response to treatment via correspondence with urology, and would be happy to continue to participate in his care if clinically indicated. I talked to the patient about what to expect in the future, including his risk for erectile dysfunction and rectal bleeding. I encouraged him to call or return to the office if he has any questions regarding his previous radiation or possible radiation side effects. He was comfortable with this plan and will follow up as needed.  ? ? ? ?Nicholos Johns, PA-C  ?

## 2022-03-07 ENCOUNTER — Telehealth: Payer: Self-pay

## 2022-03-07 NOTE — Telephone Encounter (Signed)
Called and left message  ? ?Reaching out to discuss BP control, no answer, left message for patient to return call ? ?Tomasa Blase, PharmD ?Clinical Pharmacist, [place]  ? ?

## 2022-03-16 ENCOUNTER — Telehealth: Payer: Self-pay | Admitting: *Deleted

## 2022-03-23 NOTE — Telephone Encounter (Signed)
This encounter was created in error - please disregard.

## 2022-03-26 ENCOUNTER — Ambulatory Visit (INDEPENDENT_AMBULATORY_CARE_PROVIDER_SITE_OTHER): Payer: Medicare HMO

## 2022-03-26 DIAGNOSIS — Z0000 Encounter for general adult medical examination without abnormal findings: Secondary | ICD-10-CM

## 2022-03-26 NOTE — Progress Notes (Signed)
? ?Subjective:  ? Andrew [name] is a 71 y.o. male who presents for an Subsequent Medicare Annual Wellness Visit. ? ? ?I connected with Andrew [name] today by telephone and verified that I am speaking with the correct person using two identifiers. ?Location patient: home ?Location provider: work ?Persons participating in the virtual visit: patient, provider. ?  ?I discussed the limitations, risks, security and privacy concerns of performing an evaluation and management service by telephone and the availability of in person appointments. I also discussed with the patient that there may be a patient responsible charge related to this service. The patient expressed understanding and verbally consented to this telephonic visit.  ?  ?Interactive audio and video telecommunications were attempted between this provider and patient, however failed, due to patient having technical difficulties OR patient did not have access to video capability.  We continued and completed visit with audio only. ? ?  ?Review of Systems    ? ?Cardiac Risk Factors include: advanced age (>50mn, >>32women);male gender ? ?   ?Objective:  ?  ?Today's Vitals  ? ?There is no height or weight on file to calculate BMI. ? ? ?  03/26/2022  ?  3:43 PM 02/27/2022  ?  2:41 PM 11/30/2021  ?  9:24 AM 11/30/2021  ?  9:16 AM 11/28/2021  ? 11:10 AM 10/30/2021  ?  9:05 AM 05/30/2021  ?  8:23 AM  ?Advanced Directives  ?Does Patient Have a Medical Advance Directive? No No  No No No No  ?Would patient like information on creating a medical advance directive? No - Patient declined  No - Patient declined No - Patient declined No - Patient declined Yes (MAU/Ambulatory/Procedural Areas - Information given) No - Patient declined  ? ? ?Current Medications (verified) ?Outpatient Encounter Medications as of 03/26/2022  ?Medication Sig  ? acetaminophen (TYLENOL) 500 MG tablet Take 1,000 mg by mouth every 6 (six) hours as needed for moderate pain.  ? albuterol (PROVENTIL  HFA;VENTOLIN HFA) 108 (90 Base) MCG/ACT inhaler Inhale 2 puffs into the lungs every 4 (four) hours as needed for wheezing or shortness of breath (cough, shortness of breath or wheezing.).  ? alfuzosin (UROXATRAL) 10 MG 24 hr tablet Take 1 tablet (10 mg total) by mouth in the morning and at bedtime.  ? aspirin EC 81 MG tablet Take 1 tablet (81 mg total) by mouth daily. Swallow whole.  ? budesonide-formoterol (SYMBICORT) 160-4.5 MCG/ACT inhaler Inhale 2 puffs into the lungs 2 (two) times daily.  ? cetirizine (ZYRTEC) 10 MG tablet TAKE 1 TABLET EVERY DAY (Patient taking differently: Take 10 mg by mouth daily. TAKE 1 TABLET EVERY DAY)  ? diltiazem (DILACOR XR) 240 MG 24 hr capsule TAKE 1 CAPSULE EVERY DAY (Patient taking differently: Take 240 mg by mouth daily.)  ? fluticasone (FLONASE) 50 MCG/ACT nasal spray Place 1 spray into both nostrils daily. (Patient taking differently: Place 3 sprays into both nostrils daily as needed for allergies.)  ? olmesartan (BENICAR) 40 MG tablet Take 1 tablet (40 mg total) by mouth daily.  ? omeprazole (PRILOSEC OTC) 20 MG tablet Take 20 mg by mouth daily as needed (acid reflux).  ? rosuvastatin (CRESTOR) 20 MG tablet Take 1 tablet (20 mg total) by mouth daily.  ? solifenacin (VESICARE) 5 MG tablet Take 1 tablet (5 mg total) by mouth daily.  ? tadalafil (CIALIS) 20 MG tablet Take 1 tablet (20 mg total) by mouth daily as needed for erectile dysfunction.  ? traMADol (ULTRAM) 50  MG tablet Take 1 tablet (50 mg total) by mouth every 6 (six) hours as needed. (Patient taking differently: Take 50 mg by mouth every 6 (six) hours as needed for moderate pain or severe pain.)  ? triamcinolone cream (KENALOG) 0.1 % Apply 1 application. topically 2 (two) times daily.  ? ?No facility-administered encounter medications on file as of 03/26/2022.  ? ? ?Allergies (verified) ?Patient has no active allergies.  ? ?History: ?Past Medical History:  ?Diagnosis Date  ? Allergic rhinitis   ? Anal lesion   ?  polypoid  ? Atherosclerosis of aorta ([place])   ? Carotid artery stenosis, asymptomatic, bilateral 11/2017  ? last carotid ultrasound in epic 07-19-2021  right ECA >50% and left ICA 40-59% stenosis  ? CKD (chronic kidney disease), stage III ([place])   ? followed by pcp;  previously followed by nephrologist--- dr Ambrose Pancoast  (per pt lov appprox 2020)  ? DOE (dyspnea on exertion)   ? 10-27-2021  pt stated has normal doe due to COPD, but past couple days it has even more so, Dr Melvyn Novas started pt on prednisone today  ? ED (erectile dysfunction)   ? GERD (gastroesophageal reflux disease)   ? History of adenomatous polyp of colon   ? Hyperplasia of prostate with lower urinary tract symptoms (LUTS)   ? Hypertension   ? followed by pcp;   nuclear stress test 07-13-2021 low risk no ischemia w/ nuclear ef 62%;  echo 07-13-2021 ef 55-60%, aortic valve w/ mild AR with mild to moderate sclerosis, RVSP 36.2mHg  ? [place]ipheral vascular disease ([place]   ? Pre-diabetes   ? Prostate cancer ([facility] 2018  ? urologist-- dr mAlyson Ingles  first dx 02/ 2018 Gleason 3+3, PSA 6.4 active survillance;  MRI fusion bx 04-21-2021, Gleason 3+4, PSA 20.5  ? Renal cyst, acquired   ? bilateral  ? Stage 2 moderate COPD by [place]OLD classification ([place]   ? pulmonologist--- dr wMelvyn Novas  emphysema still smoking ;  pt does not have oxygen (10-27-2021  pt stated trying to quit last cigarette in 7 days, had cut done to 3-4 cig per week)  ? Wears dentures   ? full upper and partial lower  ? Wears glasses   ? ?Past Surgical History:  ?Procedure Laterality Date  ? COLONOSCOPY WITH PROPOFOL  03/28/2021  ? by dr jArdis Hughs ? GOLD SEED IMPLANT N/A 11/30/2021  ? Procedure: GOLD SEED IMPLANT;  Surgeon: MCleon Gustin MD;  Location: [facility];  Service: Urology;  Laterality: N/A;  ? NO PAST SURGERIES    ? SPACE OAR INSTILLATION N/A 11/30/2021  ? Procedure: SPACE OAR INSTILLATION;  Surgeon: MCleon Gustin MD;  Location: [facility];  Service: Urology;  Laterality: N/A;  ? TRANSANAL  EXCISION OF RECTAL MASS N/A 10/30/2021  ? Procedure: TRANSANAL EXCISION OF ANAL CANAL POLYPOID LESION;  Surgeon: WIleana Roup MD;  [facility]  Service: General;  Laterality: N/A;  ? ?Family History  ?Problem Relation Age of Onset  ? Colon cancer Neg Hx   ? Esophageal cancer Neg Hx   ? Rectal cancer Neg Hx   ? Stomach cancer Neg Hx   ? Colon polyps Neg Hx   ? ?Social History  ? ?Socioeconomic History  ? Marital status: Married  ?  Spouse name: Not on file  ? Number of children: 1  ? Years of education: Not on file  ? Highest education level: Not on file  ?Occupational History  ? Not on file  ?  Tobacco Use  ? Smoking status: Some Days  ?  Years: 52.00  ?  Types: Cigarettes  ? Smokeless tobacco: Never  ? Tobacco comments:  ?  10-27-2021  pt stated trying to quit, last cigarette 7 days ago,  had gotten down to 3-4 cig per week (pt started smoking age 2)  ?Vaping Use  ? Vaping Use: Never used  ?Substance and Sexual Activity  ? Alcohol use: Not Currently  ?  Comment: occasional  ? Drug use: Not Currently  ?  Types: Marijuana, Cocaine  ?  Comment: 10-27-2021  pt stated last cocine age 47s and last marijuana age early 83s  ? Sexual activity: Yes  ?Other Topics Concern  ? Not on file  ?Social History Narrative  ? Not on file  ? ?Social Determinants of Health  ? ?Financial Resource Strain: Low Risk   ? Difficulty of Paying Living Expenses: Not hard at all  ?Food Insecurity: No Food Insecurity  ? Worried About Charity fundraiser in the Last Year: Never true  ? Ran Out of Food in the Last Year: Never true  ?Transportation Needs: No Transportation Needs  ? Lack of Transportation (Medical): No  ? Lack of Transportation (Non-Medical): No  ?Physical Activity: Insufficiently Active  ? Days of Exercise per Week: 3 days  ? Minutes of Exercise per Session: 30 min  ?Stress: No Stress Concern Present  ? Feeling of Stress : Not at all  ?Social Connections: Moderately Isolated  ? Frequency of  Communication with Friends and Family: Three times a week  ? Frequency of Social Gatherings with Friends and Family: Three times a week  ? Attends Religious Services: Never  ? Active Member of Clubs or Organizations: No  ? Attends

## 2022-03-26 NOTE — Patient Instructions (Signed)
[name] , ?Thank you for taking time to come for your Medicare Wellness Visit. I appreciate your ongoing commitment to your health goals. Please review the following plan we discussed and let me know if I can assist you in the future.  ? ?Screening recommendations/referrals: ?Colonoscopy: 03/28/2021 ?Recommended yearly ophthalmology/optometry visit for glaucoma screening and checkup ?Recommended yearly dental visit for hygiene and checkup ? ?Vaccinations: ?Influenza vaccine: completed  ?Pneumococcal vaccine: completed  ?Tdap vaccine: 03/29/2020 ?Shingles vaccine: will consider    ? ?Advanced directives: none  ? ?Conditions/risks identified: none  ? ?Next appointment: none  ? ?Preventive Care 3 Years and Older, Male ?Preventive care refers to lifestyle choices and visits with your health care provider that can promote health and wellness. ?What does preventive care include? ?A yearly physical exam. This is also called an annual well check. ?Dental exams once or twice a year. ?Routine eye exams. Ask your health care provider how often you should have your eyes checked. ?Personal lifestyle choices, including: ?Daily care of your teeth and gums. ?Regular physical activity. ?Eating a healthy diet. ?Avoiding tobacco and drug use. ?Limiting alcohol use. ?Practicing safe sex. ?Taking low doses of aspirin every day. ?Taking vitamin and mineral supplements as recommended by your health care provider. ?What happens during an annual well check? ?The services and screenings done by your health care provider during your annual well check will depend on your age, overall health, lifestyle risk factors, and family history of disease. ?Counseling  ?Your health care provider may ask you questions about your: ?Alcohol use. ?Tobacco use. ?Drug use. ?Emotional well-being. ?Home and relationship well-being. ?Sexual activity. ?Eating habits. ?History of falls. ?Memory and ability to understand (cognition). ?Work and work  Statistician. ?Screening  ?You may have the following tests or measurements: ?Height, weight, and BMI. ?Blood pressure. ?Lipid and cholesterol levels. These may be checked every 5 years, or more frequently if you are over 47 years old. ?Skin check. ?Lung cancer screening. You may have this screening every year starting at age 38 if you have a 30-pack-year history of smoking and currently smoke or have quit within the past 15 years. ?Fecal occult blood test (FOBT) of the stool. You may have this test every year starting at age 55. ?Flexible sigmoidoscopy or colonoscopy. You may have a sigmoidoscopy every 5 years or a colonoscopy every 10 years starting at age 24. ?Prostate cancer screening. Recommendations will vary depending on your family history and other risks. ?Hepatitis C blood test. ?Hepatitis B blood test. ?Sexually transmitted disease (STD) testing. ?Diabetes screening. This is done by checking your blood sugar (glucose) after you have not eaten for a while (fasting). You may have this done every 1-3 years. ?Abdominal aortic aneurysm (AAA) screening. You may need this if you are a current or former smoker. ?Osteoporosis. You may be screened starting at age 89 if you are at high risk. ?Talk with your health care provider about your test results, treatment options, and if necessary, the need for more tests. ?Vaccines  ?Your health care provider may recommend certain vaccines, such as: ?Influenza vaccine. This is recommended every year. ?Tetanus, diphtheria, and acellular pertussis (Tdap, Td) vaccine. You may need a Td booster every 10 years. ?Zoster vaccine. You may need this after age 60. ?Pneumococcal 13-valent conjugate (PCV13) vaccine. One dose is recommended after age 87. ?Pneumococcal polysaccharide (PPSV23) vaccine. One dose is recommended after age 29. ?Talk to your health care provider about which screenings and vaccines you need and how often you  need them. ?This information is not intended to replace  advice given to you by your health care provider. Make sure you discuss any questions you have with your health care provider. ?Document Released: 11/25/2015 Document Revised: 07/18/2016 Document Reviewed: 08/30/2015 ?Elsevier Interactive Patient Education ? 2017 [employer]. ? ?Fall Prevention in the Home ?Falls can cause injuries. They can happen to people of all ages. There are many things you can do to make your home safe and to help prevent falls. ?What can I do on the outside of my home? ?Regularly fix the edges of walkways and driveways and fix any cracks. ?Remove anything that might make you trip as you walk through a door, such as a raised step or threshold. ?Trim any bushes or trees on the path to your home. ?Use bright outdoor lighting. ?Clear any walking paths of anything that might make someone trip, such as rocks or tools. ?Regularly check to see if handrails are loose or broken. Make sure that both sides of any steps have handrails. ?Any raised decks and porches should have guardrails on the edges. ?Have any leaves, snow, or ice cleared regularly. ?Use sand or salt on walking paths during winter. ?Clean up any spills in your garage right away. This includes oil or grease spills. ?What can I do in the bathroom? ?Use night lights. ?Install grab bars by the toilet and in the tub and shower. Do not use towel bars as grab bars. ?Use non-skid mats or decals in the tub or shower. ?If you need to sit down in the shower, use a plastic, non-slip stool. ?Keep the floor dry. Clean up any water that spills on the floor as soon as it happens. ?Remove soap buildup in the tub or shower regularly. ?Attach bath mats securely with double-sided non-slip rug tape. ?Do not have throw rugs and other things on the floor that can make you trip. ?What can I do in the bedroom? ?Use night lights. ?Make sure that you have a light by your bed that is easy to reach. ?Do not use any sheets or blankets that are too big for your bed.  They should not hang down onto the floor. ?Have a firm chair that has side arms. You can use this for support while you get dressed. ?Do not have throw rugs and other things on the floor that can make you trip. ?What can I do in the kitchen? ?Clean up any spills right away. ?Avoid walking on wet floors. ?Keep items that you use a lot in easy-to-reach places. ?If you need to reach something above you, use a strong step stool that has a grab bar. ?Keep electrical cords out of the way. ?Do not use floor polish or wax that makes floors slippery. If you must use wax, use non-skid floor wax. ?Do not have throw rugs and other things on the floor that can make you trip. ?What can I do with my stairs? ?Do not leave any items on the stairs. ?Make sure that there are handrails on both sides of the stairs and use them. Fix handrails that are broken or loose. Make sure that handrails are as long as the stairways. ?Check any carpeting to make sure that it is firmly attached to the stairs. Fix any carpet that is loose or worn. ?Avoid having throw rugs at the top or bottom of the stairs. If you do have throw rugs, attach them to the floor with carpet tape. ?Make sure that you have a light switch  at the top of the stairs and the bottom of the stairs. If you do not have them, ask someone to add them for you. ?What else can I do to help prevent falls? ?Wear shoes that: ?Do not have high heels. ?Have rubber bottoms. ?Are comfortable and fit you well. ?Are closed at the toe. Do not wear sandals. ?If you use a stepladder: ?Make sure that it is fully opened. Do not climb a closed stepladder. ?Make sure that both sides of the stepladder are locked into place. ?Ask someone to hold it for you, if possible. ?Clearly mark and make sure that you can see: ?Any grab bars or handrails. ?First and last steps. ?Where the edge of each step is. ?Use tools that help you move around (mobility aids) if they are needed. These  include: ?Canes. ?Walkers. ?Scooters. ?Crutches. ?Turn on the lights when you go into a dark area. Replace any light bulbs as soon as they burn out. ?Set up your furniture so you have a clear path. Avoid moving your furniture around. ?If a

## 2022-04-04 ENCOUNTER — Telehealth: Payer: Self-pay | Admitting: *Deleted

## 2022-04-10 ENCOUNTER — Telehealth: Payer: Self-pay

## 2022-04-10 NOTE — Telephone Encounter (Signed)
Patient states Cialis is not working for him and request something stronger.  Please advise.

## 2022-04-11 ENCOUNTER — Other Ambulatory Visit: Payer: Self-pay | Admitting: Internal Medicine

## 2022-04-11 NOTE — Telephone Encounter (Signed)
Patient would like to try uping the rx to '40mg'$ .

## 2022-04-12 NOTE — Telephone Encounter (Signed)
Patient aware will call if refills are needed.

## 2022-04-20 ENCOUNTER — Telehealth: Payer: Self-pay | Admitting: *Deleted

## 2022-04-23 ENCOUNTER — Telehealth: Payer: Self-pay | Admitting: *Deleted

## 2022-05-08 ENCOUNTER — Inpatient Hospital Stay: Payer: Medicare HMO | Attending: Radiation Oncology | Admitting: *Deleted

## 2022-05-08 ENCOUNTER — Other Ambulatory Visit: Payer: Self-pay

## 2022-05-08 ENCOUNTER — Encounter: Payer: Self-pay | Admitting: *Deleted

## 2022-05-08 VITALS — BP 164/77 | HR 72 | Temp 97.3°F | Resp 18 | Ht 73.0 in | Wt 197.2 lb

## 2022-05-08 DIAGNOSIS — C61 Malignant neoplasm of prostate: Secondary | ICD-10-CM

## 2022-05-09 NOTE — Progress Notes (Signed)
Pt  presents today for SCP visit. Pt's vital signs WNL. Allergies and medications reviewed and updated. Pt denies pain and states he has no fatigue. Pt states he has hardly any urinary urgency and urine stream is near normal. He says he feels like he is emptying bladder with each void. He says he is sleeping well with getting up only once or twice a night. Pt says he no longer drinks but does smoke approx 2 cigarettes a day. He does walk friend's dog for exercise. We discussed healthy food choices and reviewed the " [employer]". Pt declined tobacco counseling at this time. Last colonoscopy was 03/2021. He will see PCP this year for an annual visit. Urology appt this month with Dr. Alyson Ingles. SCP reviewed and completed.

## 2022-05-11 ENCOUNTER — Other Ambulatory Visit: Payer: Medicare HMO

## 2022-05-11 DIAGNOSIS — C61 Malignant neoplasm of prostate: Secondary | ICD-10-CM

## 2022-05-12 LAB — PSA: Prostate Specific Ag, Serum: 7.4 ng/mL — ABNORMAL HIGH (ref 0.0–4.0)

## 2022-05-18 ENCOUNTER — Encounter: Payer: Self-pay | Admitting: Urology

## 2022-05-18 ENCOUNTER — Ambulatory Visit (INDEPENDENT_AMBULATORY_CARE_PROVIDER_SITE_OTHER): Payer: Medicare HMO | Admitting: Urology

## 2022-05-18 VITALS — BP 134/72 | HR 80

## 2022-05-18 DIAGNOSIS — R351 Nocturia: Secondary | ICD-10-CM | POA: Diagnosis not present

## 2022-05-18 DIAGNOSIS — Z8546 Personal history of malignant neoplasm of prostate: Secondary | ICD-10-CM

## 2022-05-18 DIAGNOSIS — C61 Malignant neoplasm of prostate: Secondary | ICD-10-CM

## 2022-05-18 DIAGNOSIS — N138 Other obstructive and reflux uropathy: Secondary | ICD-10-CM | POA: Diagnosis not present

## 2022-05-18 DIAGNOSIS — N401 Enlarged prostate with lower urinary tract symptoms: Secondary | ICD-10-CM

## 2022-05-18 LAB — URINALYSIS, ROUTINE W REFLEX MICROSCOPIC
Bilirubin, UA: NEGATIVE
Glucose, UA: NEGATIVE
Ketones, UA: NEGATIVE
Leukocytes,UA: NEGATIVE
Nitrite, UA: NEGATIVE
RBC, UA: NEGATIVE
Specific Gravity, UA: 1.02 (ref 1.005–1.030)
Urobilinogen, Ur: 0.2 mg/dL (ref 0.2–1.0)
pH, UA: 5 (ref 5.0–7.5)

## 2022-05-18 MED ORDER — ALFUZOSIN HCL ER 10 MG PO TB24: 10.0000 mg | ORAL_TABLET | Freq: Two times a day (BID) | ORAL | 3 refills | Status: DC

## 2022-05-18 MED ORDER — SOLIFENACIN SUCCINATE 5 MG PO TABS
5.0000 mg | ORAL_TABLET | Freq: Every day | ORAL | 2 refills | Status: DC
Start: 2022-05-18 — End: 2022-09-13

## 2022-05-18 MED ORDER — TADALAFIL 20 MG PO TABS
20.0000 mg | ORAL_TABLET | Freq: Every day | ORAL | 5 refills | Status: DC | PRN
Start: 2022-05-18 — End: 2022-09-13

## 2022-05-18 NOTE — Progress Notes (Signed)
05/18/2022 9:51 AM   [name] August 16, 1951 [id number]  Referring provider: Horald Pollen, MD [place],   [id number]  Prostate cancer and BPH   HPI: Mr Montilla is a 71yo here for followup for prostate cancer and BPH. He finished IMRT in 03/2022. PSA decreased to 7.4 from 20.  IPSS 8 QOL 2 on uroxatral '10mg'$  BID and vesicare '5mg'$  daily. No other complaints today   PMH: Past Medical History:  Diagnosis Date   Allergic rhinitis    Anal lesion    polypoid   Atherosclerosis of aorta (HCC)    Carotid artery stenosis, asymptomatic, bilateral 11/2017   last carotid ultrasound in epic 07-19-2021  right ECA >50% and left ICA 40-59% stenosis   CKD (chronic kidney disease), stage III ([place])    followed by pcp;  previously followed by nephrologist--- dr Ambrose Pancoast  (per pt lov appprox 2020)   DOE (dyspnea on exertion)    10-27-2021  pt stated has normal doe due to COPD, but past couple days it has even more so, Dr Melvyn Novas started pt on prednisone today   ED (erectile dysfunction)    GERD (gastroesophageal reflux disease)    History of adenomatous polyp of colon    Hyperplasia of prostate with lower urinary tract symptoms (LUTS)    Hypertension    followed by pcp;   nuclear stress test 07-13-2021 low risk no ischemia w/ nuclear ef 62%;  echo 07-13-2021 ef 55-60%, aortic valve w/ mild AR with mild to moderate sclerosis, RVSP 36.15mHg   Peripheral vascular disease ([facility]    Pre-diabetes    Prostate cancer ([facility] 2018   urologist-- dr mAlyson Ingles  first dx 02/ 2018 Gleason 3+3, PSA 6.4 active survillance;  MRI fusion bx 04-21-2021, Gleason 3+4, PSA 20.5   Renal cyst, acquired    bilateral   Stage 2 moderate COPD by GOLD classification ([facility]    pulmonologist--- dr wMelvyn Novas  emphysema still smoking ;  pt does not have oxygen (10-27-2021  pt stated trying to quit last cigarette in 7 days, had cut done to 3-4 cig per week)   Wears dentures    full upper and partial lower    Wears glasses     Surgical History: Past Surgical History:  Procedure Laterality Date   COLONOSCOPY WITH PROPOFOL  03/28/2021   by dr jArdis Hughs  GOLD SEED IMPLANT N/A 11/30/2021   Procedure: GOLD SEED IMPLANT;  Surgeon: MCleon Gustin MD;  Location: [facility];  Service: Urology;  Laterality: N/A;   NO PAST SURGERIES     SPACE OAR INSTILLATION N/A 11/30/2021   Procedure: SPACE OAR INSTILLATION;  Surgeon: MCleon Gustin MD;  Location: [facility];  Service: Urology;  Laterality: N/A;   TRANSANAL EXCISION OF RECTAL MASS N/A 10/30/2021   Procedure: TRANSANAL EXCISION OF ANAL CANAL POLYPOID LESION;  Surgeon: WIleana Roup MD;  [place]  Service: General;  Laterality: N/A;    Home Medications:  Allergies as of 05/18/2022   No Active Allergies      Medication List        Accurate as of May 18, 2022  9:51 AM. If you have any questions, ask your nurse or doctor.          acetaminophen 500 MG tablet Commonly known as: TYLENOL Take 1,000 mg by mouth every 6 (six) hours as needed for moderate pain.   albuterol 108 (90 Base) MCG/ACT inhaler Commonly known as: VENTOLIN HFA Inhale  2 puffs into the lungs every 4 (four) hours as needed for wheezing or shortness of breath (cough, shortness of breath or wheezing.).   alfuzosin 10 MG 24 hr tablet Commonly known as: UROXATRAL Take 1 tablet (10 mg total) by mouth in the morning and at bedtime.   aspirin EC 81 MG tablet Take 1 tablet (81 mg total) by mouth daily. Swallow whole.   cetirizine 10 MG tablet Commonly known as: ZYRTEC TAKE 1 TABLET EVERY DAY What changed:  how much to take how to take this when to take this   diltiazem 240 MG 24 hr capsule Commonly known as: DILACOR XR TAKE 1 CAPSULE EVERY DAY   fluticasone 50 MCG/ACT nasal spray Commonly known as: FLONASE Place 1 spray into both nostrils daily.   olmesartan 40 MG tablet Commonly known as: BENICAR Take 1 tablet (40 mg total) by  mouth daily.   omeprazole 20 MG tablet Commonly known as: PRILOSEC OTC Take 20 mg by mouth daily as needed (acid reflux).   rosuvastatin 20 MG tablet Commonly known as: CRESTOR Take 1 tablet (20 mg total) by mouth daily.   solifenacin 5 MG tablet Commonly known as: VESIcare Take 1 tablet (5 mg total) by mouth daily.   Symbicort 160-4.5 MCG/ACT inhaler Generic drug: budesonide-formoterol INHALE 2 PUFFS FIRST THING IN THE MORNING AND THEN 2 PUFFS ABOUT 12 HOURS LATER AS DIRECTED   tadalafil 20 MG tablet Commonly known as: CIALIS Take 1 tablet (20 mg total) by mouth daily as needed for erectile dysfunction.   traMADol 50 MG tablet Commonly known as: ULTRAM Take 1 tablet (50 mg total) by mouth every 6 (six) hours as needed.   triamcinolone cream 0.1 % Commonly known as: KENALOG Apply 1 application. topically 2 (two) times daily.        Allergies: No Active Allergies  Family History: Family History  Problem Relation Age of Onset   Colon cancer Neg Hx    Esophageal cancer Neg Hx    Rectal cancer Neg Hx    Stomach cancer Neg Hx    Colon polyps Neg Hx     Social History:  reports that he has been smoking cigarettes. He has never used smokeless tobacco. He reports that he does not currently use alcohol. He reports that he does not currently use drugs after having used the following drugs: Marijuana and Cocaine.  ROS: All other review of systems were reviewed and are negative except what is noted above in HPI  Physical Exam: BP 134/72   Pulse 80   Constitutional:  Alert and oriented, No acute distress. HEENT: [place] AT, moist mucus membranes.  Trachea midline, no masses. Cardiovascular: No clubbing, cyanosis, or edema. Respiratory: Normal respiratory effort, no increased work of breathing. GI: Abdomen is soft, nontender, nondistended, no abdominal masses GU: No CVA tenderness.  Lymph: No cervical or inguinal lymphadenopathy. Skin: No rashes, bruises or suspicious  lesions. Neurologic: Grossly intact, no focal deficits, moving all 4 extremities. Psychiatric: Normal mood and affect.  Laboratory Data: Lab Results  Component Value Date   WBC 6.3 07/08/2018   HGB 13.9 10/30/2021   HCT 41.0 10/30/2021   MCV 85.3 07/08/2018   PLT 268.0 07/08/2018    Lab Results  Component Value Date   CREATININE 1.77 (H) 11/28/2021    Lab Results  Component Value Date   PSA 5.4 (H) 09/14/2016   PSA 5.58 (H) 01/24/2016    No results found for: "TESTOSTERONE"  Lab Results  Component Value Date  HGBA1C 5.6 10/02/2019    Urinalysis    Component Value Date/Time   APPEARANCEUR Clear 02/02/2022 1120   GLUCOSEU Negative 02/02/2022 1120   BILIRUBINUR Negative 02/02/2022 1120   KETONESUR negative 10/17/2017 1615   PROTEINUR Negative 02/02/2022 1120   UROBILINOGEN 0.2 10/17/2017 1615   NITRITE Negative 02/02/2022 1120   LEUKOCYTESUR Negative 02/02/2022 1120    Lab Results  Component Value Date   LABMICR See below: 02/02/2022   WBCUA None seen 02/02/2022   LABEPIT 0-10 02/02/2022   MUCUS Present 06/09/2021   BACTERIA None seen 02/02/2022    Pertinent Imaging:  No results found for this or any previous visit.  No results found for this or any previous visit.  No results found for this or any previous visit.  No results found for this or any previous visit.  Results for orders placed during the hospital encounter of 05/27/20  US RENAL  Narrative CLINICAL DATA:  CKD stage 3  EXAM: RENAL / URINARY TRACT ULTRASOUND COMPLETE  COMPARISON:  Chest CT acquired on September 25, 2019, limited assessment of the kidneys at that time.  FINDINGS: Right Kidney:  Renal measurements: 10.7 x 4.0 x 4.5 cm = volume: 102 mL. Small hypoechoic to anechoic focus in the upper pole of the RIGHT kidney shows some central increased echogenicity and perhaps multi septate changes.  3.9 x 3.3 x 2.7 cm anechoic cyst with adjacent smaller cystic area 1.2 x 1.3  x 1.3 cm cannot be classified as a simple cyst and does not show overt signs of flow. No hydronephrosis. Decreased corticomedullary differentiation.  Left Kidney:  Renal measurements: 10.6 x 5.7 x 4.8 cm = volume: 151 mL. Decreased corticomedullary differentiation without hydronephrosis. Small mildly complex areas with multi septate or spongiform appearance in the upper pole and interpolar portion largest measuring 1.6 x 1.4 x 1.5 cm with smaller areas, less than 10 also showing some cystic characteristics.  Bladder:  Appears normal for degree of bladder distention.  Other:  None.  IMPRESSION: 1. Decreased corticomedullary differentiation without hydronephrosis. Findings can be seen in the setting of medical renal disease. 2. Multiple cystic lesions in the bilateral kidneys without renal enlargement. Some of these areas display internal complexity/variable echogenicity. MRI may be helpful for further evaluation to exclude the presence of enhancing components/small cystic renal neoplasm.  These results will be called to the ordering clinician or representative by the Radiologist Assistant, and communication documented in the PACS or [employer].   Electronically Signed By: Zetta Bills M.D. On: 05/30/2020 10:55  No results found for this or any previous visit.  No results found for this or any previous visit.  No results found for this or any previous visit.   Assessment & Plan:    1. Nocturia -continue uroxatral '10mg'$  BID and vesicare '5mg'$  daily - Urinalysis, Routine w reflex microscopic  2. Prostate cancer ([place]) -RTC 6 months with PSA  3. Benign prostatic hyperplasia with urinary obstruction -Continue uroxatral '10mg'$  BID   No follow-ups on file.  Nicolette Bang, MD  [facility] [place]

## 2022-05-18 NOTE — Patient Instructions (Signed)

## 2022-07-12 DIAGNOSIS — N1831 Chronic kidney disease, stage 3a: Secondary | ICD-10-CM | POA: Diagnosis not present

## 2022-07-12 DIAGNOSIS — R361 Hematospermia: Secondary | ICD-10-CM | POA: Diagnosis not present

## 2022-07-12 DIAGNOSIS — C61 Malignant neoplasm of prostate: Secondary | ICD-10-CM | POA: Diagnosis not present

## 2022-07-12 DIAGNOSIS — J449 Chronic obstructive pulmonary disease, unspecified: Secondary | ICD-10-CM | POA: Diagnosis not present

## 2022-07-12 DIAGNOSIS — R7303 Prediabetes: Secondary | ICD-10-CM | POA: Diagnosis not present

## 2022-07-12 DIAGNOSIS — Z720 Tobacco use: Secondary | ICD-10-CM | POA: Diagnosis not present

## 2022-07-12 DIAGNOSIS — I129 Hypertensive chronic kidney disease with stage 1 through stage 4 chronic kidney disease, or unspecified chronic kidney disease: Secondary | ICD-10-CM | POA: Diagnosis not present

## 2022-07-12 DIAGNOSIS — E785 Hyperlipidemia, unspecified: Secondary | ICD-10-CM | POA: Diagnosis not present

## 2022-08-01 ENCOUNTER — Other Ambulatory Visit: Payer: Self-pay | Admitting: Emergency Medicine

## 2022-08-02 ENCOUNTER — Encounter: Payer: Self-pay | Admitting: Emergency Medicine

## 2022-08-02 ENCOUNTER — Ambulatory Visit (INDEPENDENT_AMBULATORY_CARE_PROVIDER_SITE_OTHER): Payer: Medicare HMO | Admitting: Emergency Medicine

## 2022-08-02 VITALS — BP 120/68 | HR 69 | Temp 98.1°F | Ht 73.0 in | Wt 194.2 lb

## 2022-08-02 DIAGNOSIS — I10 Essential (primary) hypertension: Secondary | ICD-10-CM

## 2022-08-02 DIAGNOSIS — E785 Hyperlipidemia, unspecified: Secondary | ICD-10-CM

## 2022-08-02 DIAGNOSIS — I700 Atherosclerosis of aorta: Secondary | ICD-10-CM | POA: Diagnosis not present

## 2022-08-02 DIAGNOSIS — J449 Chronic obstructive pulmonary disease, unspecified: Secondary | ICD-10-CM | POA: Diagnosis not present

## 2022-08-02 DIAGNOSIS — Z23 Encounter for immunization: Secondary | ICD-10-CM | POA: Diagnosis not present

## 2022-08-02 DIAGNOSIS — R7303 Prediabetes: Secondary | ICD-10-CM

## 2022-08-02 DIAGNOSIS — F17210 Nicotine dependence, cigarettes, uncomplicated: Secondary | ICD-10-CM | POA: Diagnosis not present

## 2022-08-02 DIAGNOSIS — N1831 Chronic kidney disease, stage 3a: Secondary | ICD-10-CM

## 2022-08-02 MED ORDER — ROSUVASTATIN CALCIUM 20 MG PO TABS: 20.0000 mg | ORAL_TABLET | Freq: Every day | ORAL | 3 refills | Status: DC

## 2022-08-02 MED ORDER — DILTIAZEM HCL ER 240 MG PO CP24: 240.0000 mg | ORAL_CAPSULE | Freq: Every day | ORAL | 3 refills | Status: DC

## 2022-08-02 NOTE — Assessment & Plan Note (Signed)
Cardiovascular and cancer risk associated with smoking discussed. Smoking cessation advice given. 

## 2022-08-02 NOTE — Assessment & Plan Note (Signed)
Well-controlled hypertension. Continue olmesartan 40 mg and diltiazem XL 240 mg daily. BP Readings from Last 3 Encounters:  08/02/22 120/68  05/18/22 134/72  05/08/22 (!) 164/77  Cardiovascular risks associated with hypertension discussed. Diet and nutrition discussed.

## 2022-08-02 NOTE — Assessment & Plan Note (Signed)
Diet and nutrition discussed. Continue rosuvastatin 20 mg daily. The 10-year ASCVD risk score (Arnett DK, et al., 2019) is: 29%   Values used to calculate the score:     Age: 71 years     Sex: Male     Is Non-Hispanic African American: Yes     Diabetic: No     Tobacco smoker: Yes     Systolic Blood Pressure: 725 mmHg     Is BP treated: Yes     HDL Cholesterol: 60 mg/dL     Total Cholesterol: 263 mg/dL

## 2022-08-02 NOTE — Progress Notes (Signed)
Andrew [name] 71 y.o.   Chief Complaint  Patient presents with   Follow-up    Medication refill, elbow pain ([name]) sore to touch, knot    Numbness    Left arm numbness    HISTORY OF PRESENT ILLNESS: This is a 71 y.o. male with history hypertension here for follow-up. Goes bowling regularly.  Occasionally develops [name] elbow pain, medial aspect. Had left arm numbness several weeks ago but gone now. No other complaints or medical concerns today Smoking a lot less. Overall feeling very good. Recent blood work done by nephrologist last month.  Results reviewed with patient.  Stable labs.  No concerns.  HPI   Prior to Admission medications   Medication Sig Start Date End Date Taking? Authorizing Provider  acetaminophen (TYLENOL) 500 MG tablet Take 1,000 mg by mouth every 6 (six) hours as needed for moderate pain.   Yes [provider]  albuterol (PROVENTIL HFA;VENTOLIN HFA) 108 (90 Base) MCG/ACT inhaler Inhale 2 puffs into the lungs every 4 (four) hours as needed for wheezing or shortness of breath (cough, shortness of breath or wheezing.). 06/29/17  Yes Shawnee Knapp, MD  alfuzosin (UROXATRAL) 10 MG 24 hr tablet Take 1 tablet (10 mg total) by mouth in the morning and at bedtime. 05/18/22  Yes McKenzie, Candee Furbish, MD  aspirin EC 81 MG tablet Take 1 tablet (81 mg total) by mouth daily. Swallow whole. 01/19/22  Yes Park Liter, MD  cetirizine (ZYRTEC) 10 MG tablet TAKE 1 TABLET EVERY DAY Patient taking differently: Take 10 mg by mouth daily. TAKE 1 TABLET EVERY DAY 06/14/21  Yes Horald Pollen, MD  diltiazem (DILACOR XR) 240 MG 24 hr capsule TAKE 1 CAPSULE EVERY DAY 08/01/22  Yes Ariellah Faust, Ines Bloomer, MD  olmesartan (BENICAR) 40 MG tablet Take 1 tablet (40 mg total) by mouth daily. 12/11/21  Yes Saki Legore, Ines Bloomer, MD  omeprazole (PRILOSEC OTC) 20 MG tablet Take 20 mg by mouth daily as needed (acid reflux).   Yes [provider]  solifenacin (VESICARE) 5 MG  tablet Take 1 tablet (5 mg total) by mouth daily. 05/18/22  Yes McKenzie, Candee Furbish, MD  SYMBICORT 160-4.5 MCG/ACT inhaler INHALE 2 PUFFS FIRST THING IN THE MORNING AND THEN 2 PUFFS ABOUT 12 HOURS LATER AS DIRECTED 04/11/22  Yes Tanda Rockers, MD  tadalafil (CIALIS) 20 MG tablet Take 1 tablet (20 mg total) by mouth daily as needed for erectile dysfunction. 05/18/22  Yes McKenzie, Candee Furbish, MD  triamcinolone cream (KENALOG) 0.1 % Apply 1 application. topically 2 (two) times daily. 02/05/22  Yes Jarmar Rousseau, Ines Bloomer, MD  fluticasone [facility]) 50 MCG/ACT nasal spray Place 1 spray into both nostrils daily. Patient not taking: Reported on 05/08/2022 05/11/21   Tanda Rockers, MD  rosuvastatin (CRESTOR) 20 MG tablet Take 1 tablet (20 mg total) by mouth daily. 01/24/22 05/08/22  Park Liter, MD  traMADol (ULTRAM) 50 MG tablet Take 1 tablet (50 mg total) by mouth every 6 (six) hours as needed. 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   Cervical radiculitis 08/23/2021   Preop cardiovascular exam 06/23/2021   Atherosclerosis of aorta ([place]) 04/20/2021   Acquired thrombophilia ([employer]) 05/05/2020   Prostate cancer ([place]) 01/14/2018   Carotid artery  stenosis, asymptomatic, bilateral 12/06/2017   Vitamin D deficiency 12/03/2017   Prediabetes 11/14/2017   Chronic kidney disease (CKD) stage G3a/A2, moderately decreased glomerular filtration rate (GFR) between 45-59 mL/min/1.73 square meter and albuminuria creatinine ratio between 30-299 mg/g ([place]) 11/13/2017   Cigarette smoker 08/20/2017   COPD GOLD 2 still smoking  07/31/2017   Coronary atherosclerosis of native coronary artery 07/31/2017   Benign prostatic hyperplasia with urinary obstruction 05/30/2016   Allergic rhinitis  06/01/2008   DENTAL CARIES 06/01/2008   GERD 03/04/2008   Hyperlipidemia LDL goal <70 01/30/2008   Essential hypertension 01/30/2008   PERIPHERAL VASCULAR DISEASE 01/30/2008   COCAINE ABUSE 01/30/2008    Past Medical History:  Diagnosis Date   Allergic rhinitis    Anal lesion    polypoid   Atherosclerosis of aorta (HCC)    Carotid artery stenosis, asymptomatic, bilateral 11/2017   last carotid ultrasound in epic 07-19-2021  [name] ECA >50% and left ICA 40-59% stenosis   CKD (chronic kidney disease), stage III ([place])    followed by pcp;  previously followed by nephrologist--- dr Ambrose Pancoast  (per pt lov appprox 2020)   DOE (dyspnea on exertion)    10-27-2021  pt stated has normal doe due to COPD, but past couple days it has even more so, Dr Melvyn Novas started pt on prednisone today   ED (erectile dysfunction)    GERD (gastroesophageal reflux disease)    History of adenomatous polyp of colon    Hyperplasia of prostate with lower urinary tract symptoms (LUTS)    Hypertension    followed by pcp;   nuclear stress test 07-13-2021 low risk no ischemia w/ nuclear ef 62%;  echo 07-13-2021 ef 55-60%, aortic valve w/ mild AR with mild to moderate sclerosis, RVSP 36.60mHg   Peripheral vascular disease ([place]    Pre-diabetes    Prostate cancer ([facility] 2018   urologist-- dr mAlyson Ingles  first dx 02/ 2018 Gleason 3+3, PSA 6.4 active survillance;  MRI fusion bx 04-21-2021, Gleason 3+4, PSA 20.5   Renal cyst, acquired    bilateral   Stage 2 moderate COPD by GOLD classification ([facility]    pulmonologist--- dr wMelvyn Novas  emphysema still smoking ;  pt does not have oxygen (10-27-2021  pt stated trying to quit last cigarette in 7 days, had cut done to 3-4 cig per week)   Wears dentures    full upper and partial lower   Wears glasses     Past Surgical History:  Procedure Laterality Date   COLONOSCOPY WITH PROPOFOL  03/28/2021   by dr jArdis Hughs  GOLD SEED IMPLANT N/A 11/30/2021   Procedure: GOLD SEED IMPLANT;   Surgeon: MCleon Gustin MD;  Location: [facility];  Service: Urology;  Laterality: N/A;   NO PAST SURGERIES     SPACE OAR INSTILLATION N/A 11/30/2021   Procedure: SPACE OAR INSTILLATION;  Surgeon: MCleon Gustin MD;  Location: [facility];  Service: Urology;  Laterality: N/A;   TRANSANAL EXCISION OF RECTAL MASS N/A 10/30/2021   Procedure: TRANSANAL EXCISION OF ANAL CANAL POLYPOID LESION;  Surgeon: WIleana Roup MD;  [facility]  Service: General;  Laterality: N/A;    Social History   Socioeconomic History   Marital status: Married    Spouse name: Not on file   Number of children: 1   Years of education: Not on file   Highest education level: Not on file  Occupational History   Not on file  Tobacco Use  Smoking status: Some Days    Years: 52.00    Types: Cigarettes   Smokeless tobacco: Never   Tobacco comments:    10-27-2021  pt stated trying to quit, last cigarette 7 days ago,  had gotten down to 3-4 cig per week (pt started smoking age 40)  Vaping Use   Vaping Use: Never used  Substance and Sexual Activity   Alcohol use: Not Currently    Comment: occasional   Drug use: Not Currently    Types: Marijuana, Cocaine    Comment: 10-27-2021  pt stated last cocine age 89s and last marijuana age early 49s   Sexual activity: Yes  Other Topics Concern   Not on file  Social History Narrative   Not on file   Social Determinants of Health   Financial Resource Strain: Low Risk  (03/26/2022)   Overall Financial Resource Strain (CARDIA)    Difficulty of Paying Living Expenses: Not hard at all  Food Insecurity: No Food Insecurity (03/26/2022)   Hunger Vital Sign    Worried About Running Out of Food in the Last Year: Never true    Ran Out of Food in the Last Year: Never true  Transportation Needs: No Transportation Needs (03/26/2022)   PRAPARE - Hydrologist (Medical): No    Lack of Transportation (Non-Medical): No   Physical Activity: Insufficiently Active (03/26/2022)   Exercise Vital Sign    Days of Exercise per Week: 3 days    Minutes of Exercise per Session: 30 min  Stress: No Stress Concern Present (03/26/2022)   [place]    Feeling of Stress : Not at all  Social Connections: Moderately Isolated (03/26/2022)   Social Connection and Isolation Panel [NHANES]    Frequency of Communication with Friends and Family: Three times a week    Frequency of Social Gatherings with Friends and Family: Three times a week    Attends Religious Services: Never    Active Member of Clubs or Organizations: No    Attends Archivist Meetings: Never    Marital Status: Married  Human resources officer Violence: Not At Risk (03/26/2022)   Humiliation, Afraid, Rape, and Kick questionnaire    Fear of Current or Ex-Partner: No    Emotionally Abused: No    Physically Abused: No    Sexually Abused: No    Family History  Problem Relation Age of Onset   Colon cancer Neg Hx    Esophageal cancer Neg Hx    Rectal cancer Neg Hx    Stomach cancer Neg Hx    Colon polyps Neg Hx      Review of Systems  Constitutional: Negative.  Negative for chills and fever.  HENT: Negative.  Negative for congestion and sore throat.   Respiratory: Negative.  Negative for cough and shortness of breath.   Cardiovascular: Negative.  Negative for chest pain and palpitations.  Gastrointestinal: Negative.  Negative for abdominal pain, diarrhea, nausea and vomiting.  Genitourinary: Negative.   Skin: Negative.  Negative for rash.  Neurological:  Negative for dizziness and headaches.  All other systems reviewed and are negative.  Today's Vitals   08/02/22 1436  BP: 120/68  Pulse: 69  Temp: 98.1 F (36.7 C)  TempSrc: Oral  SpO2: 92%  Weight: 194 lb 4 oz (88.1 kg)  Height: '6\' 1"'$  (1.854 m)   Body mass index is 25.63 kg/m.   Physical Exam Vitals reviewed.   Constitutional:  Appearance: Normal appearance.  HENT:     Head: Normocephalic.     Mouth/Throat:     Mouth: Mucous membranes are moist.     Pharynx: Oropharynx is clear.  Eyes:     Extraocular Movements: Extraocular movements intact.     Conjunctiva/sclera: Conjunctivae normal.     Pupils: Pupils are equal, round, and reactive to light.  Cardiovascular:     Rate and Rhythm: Normal rate and regular rhythm.     Pulses: Normal pulses.     Heart sounds: Normal heart sounds.  Pulmonary:     Effort: Pulmonary effort is normal.     Breath sounds: Normal breath sounds.  Musculoskeletal:     Cervical back: No tenderness.  Lymphadenopathy:     Cervical: No cervical adenopathy.  Skin:    General: Skin is warm and dry.     Capillary Refill: Capillary refill takes less than 2 seconds.  Neurological:     General: No focal deficit present.     Mental Status: He is alert and oriented to person, place, and time.  Psychiatric:        Mood and Affect: Mood normal.        Behavior: Behavior normal.      ASSESSMENT & PLAN: A total of 49 minutes was spent with the patient and counseling/coordination of care regarding preparing for this visit, review of most recent office visit notes, review of multiple chronic medical problems and their management, review of all medications, review of most recent blood work results, education on nutrition, smoking advised given, prognosis, documentation and need for follow-up.  Problem List Items Addressed This Visit       Cardiovascular and Mediastinum   Essential hypertension - Primary    Well-controlled hypertension. Continue olmesartan 40 mg and diltiazem XL 240 mg daily. BP Readings from Last 3 Encounters:  08/02/22 120/68  05/18/22 134/72  05/08/22 (!) 164/77  Cardiovascular risks associated with hypertension discussed. Diet and nutrition discussed.       Relevant Medications   diltiazem (DILACOR XR) 240 MG 24 hr capsule   rosuvastatin  (CRESTOR) 20 MG tablet   Atherosclerosis of aorta (HCC)    Diet and nutrition discussed. Continue rosuvastatin 20 mg daily. The 10-year ASCVD risk score (Arnett DK, et al., 2019) is: 29%   Values used to calculate the score:     Age: 10 years     Sex: Male     Is Non-Hispanic African American: Yes     Diabetic: No     Tobacco smoker: Yes     Systolic Blood Pressure: 741 mmHg     Is BP treated: Yes     HDL Cholesterol: 60 mg/dL     Total Cholesterol: 263 mg/dL       Relevant Medications   diltiazem (DILACOR XR) 240 MG 24 hr capsule   rosuvastatin (CRESTOR) 20 MG tablet     Respiratory   COPD GOLD 2 still smoking     Stable but still smoking.  Asymptomatic. Continue Symbicort twice a day.        Genitourinary   Chronic kidney disease (CKD) stage G3a/A2, moderately decreased glomerular filtration rate (GFR) between 45-59 mL/min/1.73 square meter and albuminuria creatinine ratio between 30-299 mg/g (HCC) (Chronic)    Stable.  Advised to stay well-hydrated and avoid NSAIDs.        Other   Hyperlipidemia LDL goal <70    Diet and nutrition discussed. Continue rosuvastatin 20 mg daily.  Relevant Medications   diltiazem (DILACOR XR) 240 MG 24 hr capsule   rosuvastatin (CRESTOR) 20 MG tablet   Cigarette smoker    Cardiovascular and cancer risk associated with smoking discussed. Smoking cessation advice given.      Prediabetes    Diet and nutrition discussed.      Other Visit Diagnoses     Need for vaccination       Relevant Orders   Flu Vaccine QUAD High Dose(Fluad) (Completed)      Patient Instructions  Health Maintenance After Age 109 After age 41, you are at a higher risk for certain long-term diseases and infections as well as injuries from falls. Falls are a major cause of broken bones and head injuries in people who are older than age 40. Getting regular preventive care can help to keep you healthy and well. Preventive care includes getting regular  testing and making lifestyle changes as recommended by your health care provider. Talk with your health care provider about: Which screenings and tests you should have. A screening is a test that checks for a disease when you have no symptoms. A diet and exercise plan that is [name] for you. What should I know about screenings and tests to prevent falls? Screening and testing are the best ways to find a health problem early. Early diagnosis and treatment give you the best chance of managing medical conditions that are common after age 28. Certain conditions and lifestyle choices may make you more likely to have a fall. Your health care provider may recommend: Regular vision checks. Poor vision and conditions such as cataracts can make you more likely to have a fall. If you wear glasses, make sure to get your prescription updated if your vision changes. Medicine review. Work with your health care provider to regularly review all of the medicines you are taking, including over-the-counter medicines. Ask your health care provider about any side effects that may make you more likely to have a fall. Tell your health care provider if any medicines that you take make you feel dizzy or sleepy. Strength and balance checks. Your health care provider may recommend certain tests to check your strength and balance while standing, walking, or changing positions. Foot health exam. Foot pain and numbness, as well as not wearing proper footwear, can make you more likely to have a fall. Screenings, including: Osteoporosis screening. Osteoporosis is a condition that causes the bones to get weaker and break more easily. Blood pressure screening. Blood pressure changes and medicines to control blood pressure can make you feel dizzy. Depression screening. You may be more likely to have a fall if you have a fear of falling, feel depressed, or feel unable to do activities that you used to do. Alcohol use screening. Using too  much alcohol can affect your balance and may make you more likely to have a fall. Follow these instructions at home: Lifestyle Do not drink alcohol if: Your health care provider tells you not to drink. If you drink alcohol: Limit how much you have to: 0-1 drink a day for women. 0-2 drinks a day for men. Know how much alcohol is in your drink. In the [place] one drink equals one 12 oz bottle of beer (355 mL), one 5 oz glass of wine (148 mL), or one 1 oz glass of hard liquor (44 mL). Do not use any products that contain nicotine or tobacco. These products include cigarettes, chewing tobacco, and vaping devices, such as e-cigarettes. If you  need help quitting, ask your health care provider. Activity  Follow a regular exercise program to stay fit. This will help you maintain your balance. Ask your health care provider what types of exercise are appropriate for you. If you need a cane or walker, use it as recommended by your health care provider. Wear supportive shoes that have nonskid soles. Safety  Remove any tripping hazards, such as rugs, cords, and clutter. Install safety equipment such as grab bars in bathrooms and safety rails on stairs. Keep rooms and walkways well-lit. General instructions Talk with your health care provider about your risks for falling. Tell your health care provider if: You fall. Be sure to tell your health care provider about all falls, even ones that seem minor. You feel dizzy, tiredness (fatigue), or off-balance. Take over-the-counter and prescription medicines only as told by your health care provider. These include supplements. Eat a healthy diet and maintain a healthy weight. A healthy diet includes low-fat dairy products, low-fat (lean) meats, and fiber from whole grains, beans, and lots of fruits and vegetables. Stay current with your vaccines. Schedule regular health, dental, and eye exams. Summary Having a healthy lifestyle and getting preventive care can  help to protect your health and wellness after age 63. Screening and testing are the best way to find a health problem early and help you avoid having a fall. Early diagnosis and treatment give you the best chance for managing medical conditions that are more common for people who are older than age 15. Falls are a major cause of broken bones and head injuries in people who are older than age 83. Take precautions to prevent a fall at home. Work with your health care provider to learn what changes you can make to improve your health and wellness and to prevent falls. This information is not intended to replace advice given to you by your health care provider. Make sure you discuss any questions you have with your health care provider. Document Revised: 03/20/2021 Document Reviewed: 03/20/2021 Elsevier Patient Education  [place], MD [facility]

## 2022-08-02 NOTE — Assessment & Plan Note (Signed)
Stable but still smoking.  Asymptomatic. Continue Symbicort twice a day.

## 2022-08-02 NOTE — Assessment & Plan Note (Signed)
Diet and nutrition discussed.  Continue rosuvastatin 20 mg daily. 

## 2022-08-02 NOTE — Assessment & Plan Note (Signed)
Stable.  Advised to stay well-hydrated and avoid NSAIDs. 

## 2022-08-02 NOTE — Patient Instructions (Signed)
Health Maintenance After Age 71 After age 71, you are at a higher risk for certain long-term diseases and infections as well as injuries from falls. Falls are a major cause of broken bones and head injuries in people who are older than age 71. Getting regular preventive care can help to keep you healthy and well. Preventive care includes getting regular testing and making lifestyle changes as recommended by your health care provider. Talk with your health care provider about: Which screenings and tests you should have. A screening is a test that checks for a disease when you have no symptoms. A diet and exercise plan that is right for you. What should I know about screenings and tests to prevent falls? Screening and testing are the best ways to find a health problem early. Early diagnosis and treatment give you the best chance of managing medical conditions that are common after age 71. Certain conditions and lifestyle choices may make you more likely to have a fall. Your health care provider may recommend: Regular vision checks. Poor vision and conditions such as cataracts can make you more likely to have a fall. If you wear glasses, make sure to get your prescription updated if your vision changes. Medicine review. Work with your health care provider to regularly review all of the medicines you are taking, including over-the-counter medicines. Ask your health care provider about any side effects that may make you more likely to have a fall. Tell your health care provider if any medicines that you take make you feel dizzy or sleepy. Strength and balance checks. Your health care provider may recommend certain tests to check your strength and balance while standing, walking, or changing positions. Foot health exam. Foot pain and numbness, as well as not wearing proper footwear, can make you more likely to have a fall. Screenings, including: Osteoporosis screening. Osteoporosis is a condition that causes  the bones to get weaker and break more easily. Blood pressure screening. Blood pressure changes and medicines to control blood pressure can make you feel dizzy. Depression screening. You may be more likely to have a fall if you have a fear of falling, feel depressed, or feel unable to do activities that you used to do. Alcohol use screening. Using too much alcohol can affect your balance and may make you more likely to have a fall. Follow these instructions at home: Lifestyle Do not drink alcohol if: Your health care provider tells you not to drink. If you drink alcohol: Limit how much you have to: 0-1 drink a day for women. 0-2 drinks a day for men. Know how much alcohol is in your drink. In the [place] one drink equals one 12 oz bottle of beer (355 mL), one 5 oz glass of wine (148 mL), or one 1 oz glass of hard liquor (44 mL). Do not use any products that contain nicotine or tobacco. These products include cigarettes, chewing tobacco, and vaping devices, such as e-cigarettes. If you need help quitting, ask your health care provider. Activity  Follow a regular exercise program to stay fit. This will help you maintain your balance. Ask your health care provider what types of exercise are appropriate for you. If you need a cane or walker, use it as recommended by your health care provider. Wear supportive shoes that have nonskid soles. Safety  Remove any tripping hazards, such as rugs, cords, and clutter. Install safety equipment such as grab bars in bathrooms and safety rails on stairs. Keep rooms and walkways   well-lit. General instructions Talk with your health care provider about your risks for falling. Tell your health care provider if: You fall. Be sure to tell your health care provider about all falls, even ones that seem minor. You feel dizzy, tiredness (fatigue), or off-balance. Take over-the-counter and prescription medicines only as told by your health care provider. These include  supplements. Eat a healthy diet and maintain a healthy weight. A healthy diet includes low-fat dairy products, low-fat (lean) meats, and fiber from whole grains, beans, and lots of fruits and vegetables. Stay current with your vaccines. Schedule regular health, dental, and eye exams. Summary Having a healthy lifestyle and getting preventive care can help to protect your health and wellness after age 71. Screening and testing are the best way to find a health problem early and help you avoid having a fall. Early diagnosis and treatment give you the best chance for managing medical conditions that are more common for people who are older than age 71. Falls are a major cause of broken bones and head injuries in people who are older than age 71. Take precautions to prevent a fall at home. Work with your health care provider to learn what changes you can make to improve your health and wellness and to prevent falls. This information is not intended to replace advice given to you by your health care provider. Make sure you discuss any questions you have with your health care provider. Document Revised: 03/20/2021 Document Reviewed: 03/20/2021 Elsevier Patient Education  2023 Elsevier Inc.  

## 2022-08-02 NOTE — Assessment & Plan Note (Signed)
Diet and nutrition discussed. 

## 2022-08-15 ENCOUNTER — Other Ambulatory Visit: Payer: Self-pay | Admitting: Emergency Medicine

## 2022-08-15 DIAGNOSIS — J301 Allergic rhinitis due to pollen: Secondary | ICD-10-CM

## 2022-09-07 ENCOUNTER — Ambulatory Visit: Payer: Medicare HMO | Admitting: Internal Medicine

## 2022-09-10 ENCOUNTER — Ambulatory Visit (INDEPENDENT_AMBULATORY_CARE_PROVIDER_SITE_OTHER): Payer: Medicare HMO | Admitting: Internal Medicine

## 2022-09-10 ENCOUNTER — Encounter: Payer: Self-pay | Admitting: Internal Medicine

## 2022-09-10 DIAGNOSIS — F17210 Nicotine dependence, cigarettes, uncomplicated: Secondary | ICD-10-CM | POA: Diagnosis not present

## 2022-09-10 DIAGNOSIS — J449 Chronic obstructive pulmonary disease, unspecified: Secondary | ICD-10-CM

## 2022-09-10 MED ORDER — SPIRIVA RESPIMAT 2.5 MCG/ACT IN AERS: 2.0000 | INHALATION_SPRAY | Freq: Every day | RESPIRATORY_TRACT | 11 refills | Status: DC

## 2022-09-10 NOTE — Patient Instructions (Signed)
My office will be contacting you by phone for referral to back to the lung cancer screening program   - if you don't hear back from my office within one week please call us back or notify us thru MyChart and we'll address it right away.   Plan A = Automatic = Always=    Symbicort 160 2 each am followed by spriva 2 each am and then 12 hours later symbicort x 2 puffs   Plan B = Backup (to supplement plan A, not to replace it) Only use your albuterol inhaler as a rescue medication to be used if you can't catch your breath by resting or doing a relaxed purse lip breathing pattern.  - The less you use it, the better it will work when you need it. - Ok to use the inhaler up to 2 puffs  every 4 hours if you must but call for appointment if use goes up over your usual need - Don't leave home without it !!  (think of it like the spare tire for your car)   Plan C = Crisis (instead of Plan B but only if Plan B stops working) - only use your albuterol nebulizer if you first try Plan B and it fails to help > ok to use the nebulizer up to every 4 hours but if start needing it regularly call for immediate appointment

## 2022-09-10 NOTE — Progress Notes (Signed)
Subjective:     Patient ID: Andrew [name], male   DOB: Oct 20, 1951,    MRN: [id number]     Brief patient profile:  68  yobm active smoker referred to pulmonary clinic 09/20/2017 by Dr   Ranee Gosselin for copd eval with GOLD II criteria documented 12/09/17    History of Present Illness  09/20/2017 1st [facility] office visit/ Andrew [name]   Chief Complaint  Patient presents with   Pulmonary Consult    Referred by [name] for eval of COPD. Pt states his breathing is currently at baseline. He uses an albuterol inhaler 2-3 x per wk and has atrovent and albuterol nebs that he rarely uses.    exac 07/21/17 did not req admit with rec stating on Breo 200 denies taking too expensive so started symb ? Strength p er rx but costs 300 per month  Baseline doe now on rx = best days Not limited by breathing from desired activities lots of fast pace walking at [employer]/ some hills ok  rec Plan A = Automatic = symbiocort 160=dulera 200 Take 2 puffs first thing in am and then another 2 puffs about 12 hours later.  Work on inhaler technique:  relax and gently blow all the way out then take a nice smooth deep breath back in, triggering the inhaler at same time you start breathing in.  Hold for up to 5 seconds if you can. Blow out thru nose. Rinse and gargle with water when done Plan B = Backup Only use your albuterol as a rescue medication Plan C = Crisis - only use your albuterol nebulizer if you first try Plan B    09/08/2021  f/u ov/Andrew [name] re: copd GOLD 2 / still moking some   maint on symbicort 2bid insurance would not cover breztri   Chief Complaint  Patient presents with   Follow-up    Breathing is overall doing well and no new co's. He has been out of [place] for a few months. He uses his albuterol inhaler about 3 x per wk.    Dyspnea:  MMRC2 = can't walk a nl pace on a flat grade s sob but does fine slow and flat eg walking large stores / slt better on breztri vs advair but ca't afford  the former and advair is free  Cough: none x in ams > clear only  Sleeping: flat bed 3 pillows s resp cc  SABA use: rarely  02: none  Rec No change rx     09/10/2022  f/u ov/Andrew [name] re: GOLD 2 copd maint on symbicort 160 2bid  / better breathing on breztri/ still smoking  Chief Complaint  Patient presents with   Follow-up    No concerns.   Dyspnea:  does fine at [employer] nl pace  Cough: occ in am white  Sleeping: flat bed 2 pillows no problem  SABA use: rarely  02: none  Covid status:   vax x 3  Lung cancer screening :  in program but missed 10/17/21     No obvious day to day or daytime variability or assoc excess/ purulent sputum or mucus plugs or hemoptysis or cp or chest tightness, subjective wheeze or overt sinus or hb symptoms.   Sleeping  without nocturnal  or early am exacerbation  of respiratory  c/o's or need for noct saba. Also denies any obvious fluctuation of symptoms with weather or environmental changes or other aggravating or alleviating factors except as outlined above  No unusual exposure hx or h/o childhood pna/ asthma or knowledge of premature birth.  Current Allergies, Complete Past Medical History, Past Surgical History, Family History, and Social History were reviewed in [employer] record.  ROS  The following are not active complaints unless bolded Hoarseness, sore throat, dysphagia, dental problems, itching, sneezing,  nasal congestion or discharge of excess mucus or purulent secretions, ear ache,   fever, chills, sweats, unintended wt loss or wt gain, classically pleuritic or exertional cp,  orthopnea pnd or arm/hand swelling  or leg swelling, presyncope, palpitations, abdominal pain, anorexia, nausea, vomiting, diarrhea  or change in bowel habits or change in bladder habits, change in stools or change in urine, dysuria, hematuria,  rash, arthralgias, visual complaints, headache, numbness, weakness or ataxia or problems with walking or  coordination,  change in mood or  memory.        Current Meds  Medication Sig   Tiotropium Bromide Monohydrate (SPIRIVA RESPIMAT) 2.5 MCG/ACT AERS Inhale 2 puffs into the lungs daily.               Objective:   Physical Exam   09/10/2022      193  09/08/2021     198  12/01/2020       201 09/06/2020     200  06/16/2019        194  07/08/2018       204  12/09/2017       207   09/20/17 202 lb (91.6 kg)  09/19/17 202 lb (91.6 kg)  09/03/17 201 lb 3.2 oz (91.3 kg)       Vital signs reviewed  09/10/2022  - Note at rest 02 sats  96% on RA   General appearance:    amb pleasant bm nad    HEENT : Oropharynx  clear/ upper denture, lower partial   Nasal turbinates nl    NECK :  without  apparent JVD/ palpable Nodes/TM    LUNGS: no acc muscle use,  Mild barrel  contour chest wall with bilateral  Distant bs s audible wheeze and  without cough on insp or exp maneuvers  and mild  Hyperresonant  to  percussion bilaterally     CV:  RRR  no s3 or murmur or increase in P2, and no edema   ABD:  soft and nontender with pos end  insp Hoover's  in the supine position.  No bruits or organomegaly appreciated   MS:  Nl gait/ ext warm without deformities Or obvious joint restrictions  calf tenderness, cyanosis or clubbing     SKIN: warm and dry without lesions    NEURO:  alert, approp, nl sensorium with  no motor or cerebellar deficits apparent.          Assessment:

## 2022-09-10 NOTE — Assessment & Plan Note (Signed)
Active smoker  Counseled re importance of smoking cessation but did not meet time criteria for separate billing.  Low-dose CT lung cancer screening is recommended for patients who are 88-71 years of age with a 20+ pack-year history of smoking and who are currently smoking or quit <=15 years ago. No coughing up blood  No unintentional weight loss of > 15 pounds in the last 6 months - pt is eligible for scanning yearly until age 47 > referred back to program.          Each maintenance medication was reviewed in detail including emphasizing most importantly the difference between maintenance and prns and under what circumstances the prns are to be triggered using an action plan format where appropriate.  Total time for H and P, chart review, counseling, reviewing hfa/smi device(s) and generating customized AVS unique to this office visit / same day charting = 27 min

## 2022-09-10 NOTE — Assessment & Plan Note (Signed)
Active smoker  07/19/17 CT Mild centrilobular emphysematous changes, upper lobe predominant. - Spirometry 09/20/2017  FEV1 1.45 (42%)  Ratio 50 with typical curvature > dulera 200 (humana) 2bid   -  PFT's  12/09/2017  FEV1 2.38 (72 % ) ratio 60  p 1 % improvement from saba p symbicort 160 prior to study with DLCO  62/66 % corrects to 71  % for alv volume   - 12/09/2017   continue dulera 200 or symb 160 depending on insurance  06/16/2019  After extensive coaching inhaler device,  effectiveness =   90% hfa  - PFT's   03/03/21  FEV1 2.0 (62 % ) ratio 0.58  p 0 % improvement from saba p breztri prior to study with DLCO  21.20 (75%) corrects to 3.30 (81%)  for alv volume and FV curve classic mild concavity   - 09/10/2022  After extensive coaching inhaler device,  effectiveness =    90% with SMI > added sprivia 2.5 to symb 160    Group D (now reclassified as E) in terms of symptom/risk and laba/lama/ICS  therefore appropriate rx at this point >>>  Spiriva/symbicort 160 best choice   F/u can be yearly if doing well

## 2022-09-13 ENCOUNTER — Other Ambulatory Visit: Payer: Self-pay

## 2022-09-13 MED ORDER — SOLIFENACIN SUCCINATE 5 MG PO TABS: 5.0000 mg | ORAL_TABLET | Freq: Every day | ORAL | 2 refills | Status: DC

## 2022-09-13 MED ORDER — TADALAFIL 20 MG PO TABS: 20.0000 mg | ORAL_TABLET | Freq: Every day | ORAL | 3 refills | Status: DC | PRN

## 2022-09-13 NOTE — Progress Notes (Signed)
Patient called requesting refill of tadalfil and solifenacin rx be sent to [facility] on file.  Rx's refilled to last until next apt in 11/2022, pt notified.

## 2022-09-14 ENCOUNTER — Telehealth: Payer: Self-pay

## 2022-09-14 DIAGNOSIS — F17210 Nicotine dependence, cigarettes, uncomplicated: Secondary | ICD-10-CM

## 2022-09-14 DIAGNOSIS — Z122 Encounter for screening for malignant neoplasm of respiratory organs: Secondary | ICD-10-CM

## 2022-09-14 DIAGNOSIS — Z87891 Personal history of nicotine dependence: Secondary | ICD-10-CM

## 2022-09-14 NOTE — Telephone Encounter (Signed)
Left VM and call back number for patient to call to schedule annual LDCT. New order placed

## 2022-09-24 DIAGNOSIS — H5203 Hypermetropia, bilateral: Secondary | ICD-10-CM | POA: Diagnosis not present

## 2022-09-25 ENCOUNTER — Ambulatory Visit (HOSPITAL_BASED_OUTPATIENT_CLINIC_OR_DEPARTMENT_OTHER)
Admission: RE | Admit: 2022-09-25 | Discharge: 2022-09-25 | Disposition: A | Discharge status: Home / Self Care | Payer: Medicare HMO | Source: Ambulatory Visit | Attending: Acute Care | Admitting: Acute Care

## 2022-09-25 DIAGNOSIS — J439 Emphysema, unspecified: Secondary | ICD-10-CM | POA: Insufficient documentation

## 2022-09-25 DIAGNOSIS — I700 Atherosclerosis of aorta: Secondary | ICD-10-CM | POA: Diagnosis not present

## 2022-09-25 DIAGNOSIS — Z87891 Personal history of nicotine dependence: Secondary | ICD-10-CM

## 2022-09-25 DIAGNOSIS — Z122 Encounter for screening for malignant neoplasm of respiratory organs: Secondary | ICD-10-CM | POA: Diagnosis not present

## 2022-09-25 DIAGNOSIS — F17210 Nicotine dependence, cigarettes, uncomplicated: Secondary | ICD-10-CM | POA: Insufficient documentation

## 2022-09-27 ENCOUNTER — Other Ambulatory Visit: Payer: Self-pay

## 2022-09-27 DIAGNOSIS — Z87891 Personal history of nicotine dependence: Secondary | ICD-10-CM

## 2022-09-27 DIAGNOSIS — Z122 Encounter for screening for malignant neoplasm of respiratory organs: Secondary | ICD-10-CM

## 2022-09-27 DIAGNOSIS — F17210 Nicotine dependence, cigarettes, uncomplicated: Secondary | ICD-10-CM

## 2022-10-16 ENCOUNTER — Other Ambulatory Visit: Payer: Self-pay | Admitting: Emergency Medicine

## 2022-11-07 ENCOUNTER — Other Ambulatory Visit: Payer: Medicare HMO

## 2022-11-14 ENCOUNTER — Other Ambulatory Visit: Payer: Self-pay | Admitting: Emergency Medicine

## 2022-11-14 DIAGNOSIS — I10 Essential (primary) hypertension: Secondary | ICD-10-CM

## 2022-11-16 ENCOUNTER — Ambulatory Visit (INDEPENDENT_AMBULATORY_CARE_PROVIDER_SITE_OTHER): Payer: Medicare HMO | Admitting: Urology

## 2022-11-16 VITALS — BP 88/50 | HR 76

## 2022-11-16 DIAGNOSIS — C61 Malignant neoplasm of prostate: Secondary | ICD-10-CM | POA: Diagnosis not present

## 2022-11-16 DIAGNOSIS — N401 Enlarged prostate with lower urinary tract symptoms: Secondary | ICD-10-CM | POA: Diagnosis not present

## 2022-11-16 DIAGNOSIS — R351 Nocturia: Secondary | ICD-10-CM | POA: Diagnosis not present

## 2022-11-16 DIAGNOSIS — N138 Other obstructive and reflux uropathy: Secondary | ICD-10-CM | POA: Diagnosis not present

## 2022-11-16 DIAGNOSIS — N5201 Erectile dysfunction due to arterial insufficiency: Secondary | ICD-10-CM

## 2022-11-16 LAB — MICROSCOPIC EXAMINATION
Bacteria, UA: NONE SEEN
RBC, Urine: NONE SEEN /hpf (ref 0–2)

## 2022-11-16 LAB — URINALYSIS, ROUTINE W REFLEX MICROSCOPIC
Bilirubin, UA: NEGATIVE
Glucose, UA: NEGATIVE
Ketones, UA: NEGATIVE
Leukocytes,UA: NEGATIVE
Nitrite, UA: NEGATIVE
RBC, UA: NEGATIVE
Specific Gravity, UA: 1.03 (ref 1.005–1.030)
Urobilinogen, Ur: 0.2 mg/dL (ref 0.2–1.0)
pH, UA: 5 (ref 5.0–7.5)

## 2022-11-16 MED ORDER — SILDENAFIL CITRATE 100 MG PO TABS: 100.0000 mg | ORAL_TABLET | Freq: Every day | ORAL | 5 refills | Status: DC | PRN

## 2022-11-16 MED ORDER — SILODOSIN 8 MG PO CAPS: 8.0000 mg | ORAL_CAPSULE | Freq: Every day | ORAL | 11 refills | Status: DC

## 2022-11-16 MED ORDER — SOLIFENACIN SUCCINATE 5 MG PO TABS: 5.0000 mg | ORAL_TABLET | Freq: Every day | ORAL | 11 refills | Status: DC

## 2022-11-16 NOTE — Patient Instructions (Signed)

## 2022-11-16 NOTE — Progress Notes (Unsigned)
11/16/2022 9:45 AM   [name] [birth year] [id number]  Referring provider: Horald Pollen, MD [place],  [place] [id number]  No chief complaint on file.   HPI:    PMH: Past Medical History:  Diagnosis Date   Allergic rhinitis    Anal lesion    polypoid   Atherosclerosis of aorta (HCC)    Carotid artery stenosis, asymptomatic, bilateral 11/2017   last carotid ultrasound in epic 07-19-2021  right ECA >50% and left ICA 40-59% stenosis   CKD (chronic kidney disease), stage III ([place])    followed by pcp;  previously followed by nephrologist--- dr Ambrose Pancoast  (per pt lov appprox 2020)   DOE (dyspnea on exertion)    10-27-2021  pt stated has normal doe due to COPD, but past couple days it has even more so, Dr Melvyn Novas started pt on prednisone today   ED (erectile dysfunction)    GERD (gastroesophageal reflux disease)    History of adenomatous polyp of colon    Hyperplasia of prostate with lower urinary tract symptoms (LUTS)    Hypertension    followed by pcp;   nuclear stress test 07-13-2021 low risk no ischemia w/ nuclear ef 62%;  echo 07-13-2021 ef 55-60%, aortic valve w/ mild AR with mild to moderate sclerosis, RVSP 36.52mHg   [place]ipheral vascular disease ([facility]    Pre-diabetes    Prostate cancer ([facility] 2018   urologist-- dr mAlyson Ingles  first dx 02/ 2018 Gleason 3+3, PSA 6.4 active survillance;  MRI fusion bx 04-21-2021, Gleason 3+4, PSA 20.5   Renal cyst, acquired    bilateral   Stage 2 moderate COPD by GOLD classification ([facility]    pulmonologist--- dr wMelvyn Novas  emphysema still smoking ;  pt does not have oxygen (10-27-2021  pt stated trying to quit last cigarette in 7 days, had cut done to 3-4 cig per week)   Wears dentures    full upper and partial lower   Wears glasses     Surgical History: Past Surgical History:  Procedure Laterality Date   COLONOSCOPY WITH PROPOFOL  03/28/2021   by dr jArdis Hughs  GOLD SEED IMPLANT N/A 11/30/2021   Procedure: GOLD SEED  IMPLANT;  Surgeon: MCleon Gustin MD;  Location: [facility];  Service: Urology;  Laterality: N/A;   NO PAST SURGERIES     SPACE OAR INSTILLATION N/A 11/30/2021   Procedure: SPACE OAR INSTILLATION;  Surgeon: MCleon Gustin MD;  Location: [facility];  Service: Urology;  Laterality: N/A;   TRANSANAL EXCISION OF RECTAL MASS N/A 10/30/2021   Procedure: TRANSANAL EXCISION OF ANAL CANAL POLYPOID LESION;  Surgeon: WIleana Roup MD;  [facility]  Service: General;  Laterality: N/A;    Home Medications:  Allergies as of 11/16/2022       Reactions   Lisinopril-hydrochlorothiazide    dizziness        Medication List        Accurate as of November 16, 2022  9:45 AM. If you have any questions, ask your nurse or doctor.          acetaminophen 500 MG tablet Commonly known as: TYLENOL Take 1,000 mg by mouth every 6 (six) hours as needed for moderate pain.   albuterol 108 (90 Base) MCG/ACT inhaler Commonly known as: VENTOLIN HFA Inhale 2 puffs into the lungs every 4 (four) hours as needed for wheezing or shortness of breath (cough, shortness of breath or wheezing.).   alfuzosin 10 MG  24 hr tablet Commonly known as: UROXATRAL Take 1 tablet (10 mg total) by mouth in the morning and at bedtime.   aspirin EC 81 MG tablet Take 1 tablet (81 mg total) by mouth daily. Swallow whole.   cetirizine 10 MG tablet Commonly known as: ZYRTEC TAKE 1 TABLET EVERY DAY   diltiazem 240 MG 24 hr capsule Commonly known as: DILACOR XR Take 1 capsule (240 mg total) by mouth daily.   fluticasone 50 MCG/ACT nasal spray Commonly known as: FLONASE Place 1 spray into both nostrils daily.   olmesartan 40 MG tablet Commonly known as: BENICAR TAKE 1 TABLET EVERY DAY   omeprazole 20 MG tablet Commonly known as: PRILOSEC OTC Take 20 mg by mouth daily as needed (acid reflux).   rosuvastatin 20 MG tablet Commonly known as: CRESTOR Take 1 tablet (20 mg total) by mouth  daily.   solifenacin 5 MG tablet Commonly known as: VESIcare Take 1 tablet (5 mg total) by mouth daily.   Spiriva Respimat 2.5 MCG/ACT Aers Generic drug: Tiotropium Bromide Monohydrate Inhale 2 puffs into the lungs daily.   Symbicort 160-4.5 MCG/ACT inhaler Generic drug: budesonide-formoterol INHALE 2 PUFFS FIRST THING IN THE MORNING AND THEN 2 PUFFS ABOUT 12 HOURS LATER AS DIRECTED   tadalafil 20 MG tablet Commonly known as: CIALIS Take 1 tablet (20 mg total) by mouth daily as needed for erectile dysfunction.   traMADol 50 MG tablet Commonly known as: ULTRAM Take 1 tablet (50 mg total) by mouth every 6 (six) hours as needed.   triamcinolone cream 0.1 % Commonly known as: KENALOG APPLY TO THE AFFECTED AREA(S) TOPICALLY TWICE DAILY        Allergies:  Allergies  Allergen Reactions   Lisinopril-Hydrochlorothiazide     dizziness    Family History: Family History  Problem Relation Age of Onset   Colon cancer Neg Hx    Esophageal cancer Neg Hx    Rectal cancer Neg Hx    Stomach cancer Neg Hx    Colon polyps Neg Hx     Social History:  reports that he has been smoking cigarettes. He has never used smokeless tobacco. He reports that he does not currently use alcohol. He reports that he does not currently use drugs after having used the following drugs: Marijuana and Cocaine.  ROS: All other review of systems were reviewed and are negative except what is noted above in HPI  Physical Exam: BP (!) 88/50   Pulse 76   Constitutional:  Alert and oriented, No acute distress. HEENT: [place] AT, moist mucus membranes.  Trachea midline, no masses. Cardiovascular: No clubbing, cyanosis, or edema. Respiratory: Normal respiratory effort, no increased work of breathing. GI: Abdomen is soft, nontender, nondistended, no abdominal masses GU: No CVA tenderness.  Lymph: No cervical or inguinal lymphadenopathy. Skin: No rashes, bruises or suspicious lesions. Neurologic: Grossly intact,  no focal deficits, moving all 4 extremities. Psychiatric: Normal mood and affect.  Laboratory Data: Lab Results  Component Value Date   WBC 6.3 07/08/2018   HGB 13.9 10/30/2021   HCT 41.0 10/30/2021   MCV 85.3 07/08/2018   PLT 268.0 07/08/2018    Lab Results  Component Value Date   CREATININE 1.77 (H) 11/28/2021    Lab Results  Component Value Date   PSA 5.4 (H) 09/14/2016   PSA 5.58 (H) 01/24/2016    No results found for: "TESTOSTERONE"  Lab Results  Component Value Date   HGBA1C 5.6 10/02/2019    Urinalysis  Component Value Date/Time   APPEARANCEUR Clear 05/18/2022 0932   GLUCOSEU Negative 05/18/2022 0932   BILIRUBINUR Negative 05/18/2022 0932   KETONESUR negative 10/17/2017 1615   PROTEINUR 2+ (A) 05/18/2022 0932   UROBILINOGEN 0.2 10/17/2017 1615   NITRITE Negative 05/18/2022 0932   LEUKOCYTESUR Negative 05/18/2022 0932    Lab Results  Component Value Date   LABMICR Comment 05/18/2022   WBCUA None seen 02/02/2022   LABEPIT 0-10 02/02/2022   MUCUS Present 06/09/2021   BACTERIA None seen 02/02/2022    Pertinent Imaging: *** No results found for this or any previous visit.  No results found for this or any previous visit.  No results found for this or any previous visit.  No results found for this or any previous visit.  Results for orders placed during the hospital encounter of 05/27/20  US RENAL  Narrative CLINICAL DATA:  CKD stage 3  EXAM: RENAL / URINARY TRACT ULTRASOUND COMPLETE  COMPARISON:  Chest CT acquired on September 25, 2019, limited assessment of the kidneys at that time.  FINDINGS: Right Kidney:  Renal measurements: 10.7 x 4.0 x 4.5 cm = volume: 102 mL. Small hypoechoic to anechoic focus in the upper pole of the RIGHT kidney shows some central increased echogenicity and perhaps multi septate changes.  3.9 x 3.3 x 2.7 cm anechoic cyst with adjacent smaller cystic area 1.2 x 1.3 x 1.3 cm cannot be classified as a  simple cyst and does not show overt signs of flow. No hydronephrosis. Decreased corticomedullary differentiation.  Left Kidney:  Renal measurements: 10.6 x 5.7 x 4.8 cm = volume: 151 mL. Decreased corticomedullary differentiation without hydronephrosis. Small mildly complex areas with multi septate or spongiform appearance in the upper pole and interpolar portion largest measuring 1.6 x 1.4 x 1.5 cm with smaller areas, less than 10 also showing some cystic characteristics.  Bladder:  Appears normal for degree of bladder distention.  Other:  None.  IMPRESSION: 1. Decreased corticomedullary differentiation without hydronephrosis. Findings can be seen in the setting of medical renal disease. 2. Multiple cystic lesions in the bilateral kidneys without renal enlargement. Some of these areas display internal complexity/variable echogenicity. MRI may be helpful for further evaluation to exclude the presence of enhancing components/small cystic renal neoplasm.  These results will be called to the ordering clinician or representative by the Radiologist Assistant, and communication documented in the PACS or [employer].   Electronically Signed By: Zetta Bills M.D. On: 05/30/2020 10:55  No valid procedures specified. No results found for this or any previous visit.  No results found for this or any previous visit.   Assessment & Plan:    1. Prostate cancer ([place]) -PSA today, if stable followup 6 months  - Urinalysis, Routine w reflex microscopic  2. Nocturia -rapaflo '8mg'$  qhs  3. Benign prostatic hyperplasia with urinary obstruction -switch to to rapaflo. Stop uroxatral '10mg'$    No follow-ups on file.  Nicolette Bang, MD  [facility] [place]

## 2022-11-17 LAB — PSA: Prostate Specific Ag, Serum: 4.9 ng/mL — ABNORMAL HIGH (ref 0.0–4.0)

## 2022-11-20 ENCOUNTER — Encounter: Payer: Self-pay | Admitting: Urology

## 2022-11-30 ENCOUNTER — Ambulatory Visit (INDEPENDENT_AMBULATORY_CARE_PROVIDER_SITE_OTHER): Payer: Medicare HMO | Admitting: Family Medicine

## 2022-11-30 ENCOUNTER — Encounter: Payer: Self-pay | Admitting: Family Medicine

## 2022-11-30 VITALS — BP 132/56 | HR 67 | Temp 97.7°F | Ht 73.0 in | Wt 199.0 lb

## 2022-11-30 DIAGNOSIS — J449 Chronic obstructive pulmonary disease, unspecified: Secondary | ICD-10-CM | POA: Diagnosis not present

## 2022-11-30 DIAGNOSIS — R051 Acute cough: Secondary | ICD-10-CM | POA: Diagnosis not present

## 2022-11-30 DIAGNOSIS — R0981 Nasal congestion: Secondary | ICD-10-CM

## 2022-11-30 NOTE — Patient Instructions (Addendum)
It sounds like you have had a viral illness and I am glad you are feeling better.  Use saline nasal spray or Flonase for nasal congestion.  Continue to take Mucinex for cough and chest congestion.  Stay well-hydrated.  Continue your usual allergy and asthma medications.  Use albuterol as needed.  Follow-up if you are getting worse or not back to baseline in the next 4 to 5 days.

## 2022-11-30 NOTE — Progress Notes (Signed)
Subjective:  Andrew [name] is a 72 y.o. male who presents for 4-5 day hx of cough and nasal congestion in the mornings. States he is feeling much improved. Did not cough last night or today. Congestion is also improving.  He has underlying COPD and smoking but cutting back. Using inhaler as prescribed and needs albuterol infrequently.   Denies fever, chills, dizziness, chest pain, palpitations, shortness of breath, abdominal pain, N/V/D.    No other aggravating or relieving factors.  No other c/o.  ROS as in subjective.   Objective: Vitals:   11/30/22 1410 11/30/22 1430  BP: (!) 150/60 (!) 132/56  Pulse: 67   Temp: 97.7 F (36.5 C)   SpO2: 95%     General appearance: Alert, WD/WN, no distress                             Skin: warm, no rash                           Head: no sinus tenderness                            Eyes: conjunctiva normal, corneas clear, PERRLA                            Ears: pearly TMs, external ear canals normal                          Nose: septum midline, turbinates swollen, with erythema and clear discharge             Mouth/throat: MMM, tongue normal, mild pharyngeal erythema                           Neck: supple, no adenopathy, no thyromegaly, nontender                          Heart: RRR                         Lungs: + exp wheezes, no rales, or rhonchi      Assessment: Acute cough  COPD GOLD 2 still smoking   Nasal congestion   Plan: Symptoms improving since scheduling appointment.  COPD without acute exacerbation.  Discussed diagnosis and treatment of viral URI.  Suggested symptomatic OTC remedies. Nasal saline spray for congestion.  Call/return in 2-3 days if symptoms aren't resolving.

## 2022-12-13 ENCOUNTER — Telehealth: Payer: Self-pay

## 2022-12-13 NOTE — Telephone Encounter (Signed)
Patient left a voice message 12-12-22.  Symptoms of pain during urination and bleeding after urination.  Spoke with patient 12-13-22.   Bleeding stopped. Having some pain. Will give a urine specimen on 12-14-22.

## 2022-12-14 ENCOUNTER — Ambulatory Visit: Payer: Medicare HMO

## 2023-02-04 DIAGNOSIS — E785 Hyperlipidemia, unspecified: Secondary | ICD-10-CM | POA: Diagnosis not present

## 2023-02-04 DIAGNOSIS — Z720 Tobacco use: Secondary | ICD-10-CM | POA: Diagnosis not present

## 2023-02-04 DIAGNOSIS — I129 Hypertensive chronic kidney disease with stage 1 through stage 4 chronic kidney disease, or unspecified chronic kidney disease: Secondary | ICD-10-CM | POA: Diagnosis not present

## 2023-02-04 DIAGNOSIS — R7303 Prediabetes: Secondary | ICD-10-CM | POA: Diagnosis not present

## 2023-02-04 DIAGNOSIS — C61 Malignant neoplasm of prostate: Secondary | ICD-10-CM | POA: Diagnosis not present

## 2023-02-04 DIAGNOSIS — N1831 Chronic kidney disease, stage 3a: Secondary | ICD-10-CM | POA: Diagnosis not present

## 2023-02-04 DIAGNOSIS — J449 Chronic obstructive pulmonary disease, unspecified: Secondary | ICD-10-CM | POA: Diagnosis not present

## 2023-02-04 DIAGNOSIS — R361 Hematospermia: Secondary | ICD-10-CM | POA: Diagnosis not present

## 2023-02-18 ENCOUNTER — Encounter: Payer: Self-pay | Admitting: Urology

## 2023-03-04 ENCOUNTER — Telehealth: Payer: Self-pay

## 2023-03-04 ENCOUNTER — Ambulatory Visit (INDEPENDENT_AMBULATORY_CARE_PROVIDER_SITE_OTHER): Payer: Medicare HMO | Admitting: Emergency Medicine

## 2023-03-04 VITALS — BP 130/72 | HR 67 | Temp 97.9°F | Ht 73.0 in | Wt 191.0 lb

## 2023-03-04 DIAGNOSIS — M109 Gout, unspecified: Secondary | ICD-10-CM | POA: Insufficient documentation

## 2023-03-04 MED ORDER — COLCHICINE 0.6 MG PO TABS: ORAL_TABLET | ORAL | 1 refills | Status: DC

## 2023-03-04 MED ORDER — METHYLPREDNISOLONE 4 MG PO TBPK: ORAL_TABLET | ORAL | 1 refills | Status: DC

## 2023-03-04 NOTE — Patient Instructions (Signed)
Gout  Gout is painful swelling of your joints. Gout is a type of arthritis. It is caused by having too much uric acid in your body. Uric acid is a chemical that is made when your body breaks down substances called purines. If your body has too much uric acid, sharp crystals can form and build up in your joints. This causes pain and swelling. Gout attacks can happen quickly and be very painful (acute gout). Over time, the attacks can affect more joints and happen more often (chronic gout). What are the causes? Gout is caused by too much uric acid in your blood. This can happen because: Your kidneys do not remove enough uric acid from your blood. Your body makes too much uric acid. You eat too many foods that are high in purines. These foods include organ meats, some seafood, and beer. Trauma or stress can bring on an attack. What increases the risk? Having a family history of gout. Being male and middle-aged. Being male and having gone through menopause. Having an organ transplant. Taking certain medicines. Having certain conditions, such as: Being very overweight (obese). Lead poisoning. Kidney disease. A skin condition called psoriasis. Other risks include: Losing weight too quickly. Not having enough water in the body (being dehydrated). Drinking alcohol, especially beer. Drinking beverages that are sweetened with a type of sugar called fructose. What are the signs or symptoms? An attack of acute gout often starts at night and usually happens in just one joint. The most common place is the big toe. Other joints that may be affected include joints of the feet, ankle, knee, fingers, wrist, or elbow. Symptoms may include: Very bad pain. Warmth. Swelling. Stiffness. Tenderness. The affected joint may be very painful to touch. Shiny, red, or purple skin. Chills and fever. Chronic gout may cause symptoms more often. More joints may be involved. You may also have white or yellow lumps  (tophi) on your hands or feet or in other areas near your joints. How is this treated? Treatment for an acute attack may include medicines for pain and swelling, such as: NSAIDs, such as ibuprofen. Steroids taken by mouth or injected into a joint. Colchicine. This can be given by mouth or through an IV tube. Treatment to prevent future attacks may include: Taking small doses of NSAIDs or colchicine daily. Using a medicine that reduces uric acid levels in your blood, such as allopurinol. Making changes to your diet. You may need to see a food expert (dietitian) about what to eat and drink to prevent gout. Follow these instructions at home: During a gout attack  If told, put ice on the painful area. To do this: Put ice in a plastic bag. Place a towel between your skin and the bag. Leave the ice on for 20 minutes, 2-3 times a day. Take off the ice if your skin turns bright red. This is very important. If you cannot feel pain, heat, or cold, you have a greater risk of damage to the area. Raise the painful joint above the level of your heart as often as you can. Rest the joint as much as possible. If the joint is in your leg, you may be given crutches. Follow instructions from your doctor about what you cannot eat or drink. Avoiding future gout attacks Eat a low-purine diet. Avoid foods and drinks such as: Liver. Kidney. Anchovies. Asparagus. Herring. Mushrooms. Mussels. Beer. Stay at a healthy weight. If you want to lose weight, talk with your doctor. Do not   lose weight too fast. Start or continue an exercise plan as told by your doctor. Eating and drinking Avoid drinks sweetened by fructose. Drink enough fluids to keep your pee (urine) pale yellow. If you drink alcohol: Limit how much you have to: 0-1 drink a day for women who are not pregnant. 0-2 drinks a day for men. Know how much alcohol is in a drink. In the [place] one drink equals one 12 oz bottle of beer (355 mL), one 5 oz  glass of wine (148 mL), or one 1 oz glass of hard liquor (44 mL). General instructions Take over-the-counter and prescription medicines only as told by your doctor. Ask your doctor if you should avoid driving or using machines while you are taking your medicine. Return to your normal activities when your doctor says that it is safe. Keep all follow-up visits. Where to find more information [employer] Institutes of Health: www.niams.nih.gov Contact a doctor if: You have another gout attack. You still have symptoms of a gout attack after 10 days of treatment. You have problems (side effects) because of your medicines. You have chills or a fever. You have burning pain when you pee (urinate). You have pain in your lower back or belly. Get help right away if: You have very bad pain. Your pain cannot be controlled. You cannot pee. Summary Gout is painful swelling of the joints. The most common site of pain is the big toe, but it can affect other joints. Medicines and avoiding some foods can help to prevent and treat gout attacks. This information is not intended to replace advice given to you by your health care provider. Make sure you discuss any questions you have with your health care provider. Document Revised: 08/02/2021 Document Reviewed: 08/02/2021 Elsevier Patient Education  2023 Elsevier Inc.  

## 2023-03-04 NOTE — Progress Notes (Signed)
Andrew [name] 72 y.o.   Chief Complaint  Patient presents with   Foot Injury    Right foot swollen, red and painful x 5 days     HISTORY OF PRESENT ILLNESS: This is a 72 y.o. male complaining of pain and swelling to the right great toe that started several days ago Denies injury.  No history of gout No other associated symptoms No other complaints or medical concerns today.  Foot Injury      Prior to Admission medications   Medication Sig Start Date End Date Taking? Authorizing Provider  acetaminophen (TYLENOL) 500 MG tablet Take 1,000 mg by mouth every 6 (six) hours as needed for moderate pain.   Yes [provider]  albuterol (PROVENTIL HFA;VENTOLIN HFA) 108 (90 Base) MCG/ACT inhaler Inhale 2 puffs into the lungs every 4 (four) hours as needed for wheezing or shortness of breath (cough, shortness of breath or wheezing.). 06/29/17  Yes Sherren Mocha, MD  aspirin EC 81 MG tablet Take 1 tablet (81 mg total) by mouth daily. Swallow whole. 01/19/22  Yes Georgeanna Lea, MD  cetirizine (ZYRTEC) 10 MG tablet TAKE 1 TABLET EVERY DAY 08/15/22  Yes Ember Henrikson, Eilleen Kempf, MD  colchicine 0.6 MG tablet Take 1.2 mg today and 0.6 mg 1 hour later.  Then take 0.6 mg daily for 5 days 03/04/23  Yes Frimet Durfee, Eilleen Kempf, MD  diltiazem (DILACOR XR) 240 MG 24 hr capsule Take 1 capsule (240 mg total) by mouth daily. 08/02/22  Yes Evalene Vath, Eilleen Kempf, MD  fluticasone [facility]) 50 MCG/ACT nasal spray Place 1 spray into both nostrils daily. 05/11/21  Yes Nyoka Cowden, MD  methylPREDNISolone (MEDROL DOSEPAK) 4 MG TBPK tablet Sig as indicated 03/04/23  Yes Cj Beecher, Eilleen Kempf, MD  olmesartan (BENICAR) 40 MG tablet TAKE 1 TABLET EVERY DAY 11/14/22  Yes Julianne Chamberlin, Eilleen Kempf, MD  omeprazole (PRILOSEC OTC) 20 MG tablet Take 20 mg by mouth daily as needed (acid reflux).   Yes [provider]  rosuvastatin (CRESTOR) 20 MG tablet Take 1 tablet (20 mg total) by mouth daily. 08/02/22 07/28/23 Yes  Kellen Hover, Eilleen Kempf, MD  sildenafil (VIAGRA) 100 MG tablet Take 1 tablet (100 mg total) by mouth daily as needed for erectile dysfunction. 11/16/22  Yes McKenzie, Mardene Celeste, MD  silodosin (RAPAFLO) 8 MG CAPS capsule Take 1 capsule (8 mg total) by mouth at bedtime. 11/16/22  Yes McKenzie, Mardene Celeste, MD  solifenacin (VESICARE) 5 MG tablet Take 1 tablet (5 mg total) by mouth daily. 11/16/22  Yes McKenzie, Mardene Celeste, MD  SYMBICORT 160-4.5 MCG/ACT inhaler INHALE 2 PUFFS FIRST THING IN THE MORNING AND THEN 2 PUFFS ABOUT 12 HOURS LATER AS DIRECTED 04/11/22  Yes Nyoka Cowden, MD  tadalafil (CIALIS) 20 MG tablet Take 1 tablet (20 mg total) by mouth daily as needed for erectile dysfunction. 09/13/22  Yes McKenzie, Mardene Celeste, MD  Tiotropium Bromide Monohydrate (SPIRIVA RESPIMAT) 2.5 MCG/ACT AERS Inhale 2 puffs into the lungs daily. 09/10/22  Yes Nyoka Cowden, MD  traMADol (ULTRAM) 50 MG tablet Take 1 tablet (50 mg total) by mouth every 6 (six) hours as needed. 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   Cervical radiculitis 08/23/2021   Preop cardiovascular exam 06/23/2021   Atherosclerosis of aorta 04/20/2021   Acquired thrombophilia 05/05/2020   Prostate cancer 01/14/2018   Carotid artery stenosis, asymptomatic, bilateral 12/06/2017   Vitamin D deficiency 12/03/2017   Prediabetes 11/14/2017   Chronic kidney disease (CKD) stage G3a/A2, moderately decreased glomerular filtration rate (GFR) between 45-59 mL/min/1.73 square meter and albuminuria creatinine ratio between 30-299 mg/g 11/13/2017   Cigarette smoker 08/20/2017   COPD GOLD 2 still smoking   07/31/2017   Coronary atherosclerosis of native coronary artery 07/31/2017   Benign prostatic hyperplasia with urinary obstruction 05/30/2016   Allergic rhinitis 06/01/2008   DENTAL CARIES 06/01/2008   GERD 03/04/2008   Hyperlipidemia LDL goal <70 01/30/2008   Essential hypertension 01/30/2008   PERIPHERAL VASCULAR DISEASE 01/30/2008   COCAINE ABUSE 01/30/2008    Past Medical History:  Diagnosis Date   Allergic rhinitis    Anal lesion    polypoid   Atherosclerosis of aorta    Carotid artery stenosis, asymptomatic, bilateral 11/2017   last carotid ultrasound in epic 07-19-2021  right ECA >50% and left ICA 40-59% stenosis   CKD (chronic kidney disease), stage III    followed by pcp;  previously followed by nephrologist--- dr Eulogio Ditch  (per pt lov appprox 2020)   DOE (dyspnea on exertion)    10-27-2021  pt stated has normal doe due to COPD, but past couple days it has even more so, Dr Sherene Sires started pt on prednisone today   ED (erectile dysfunction)    GERD (gastroesophageal reflux disease)    History of adenomatous polyp of colon    Hyperplasia of prostate with lower urinary tract symptoms (LUTS)    Hypertension    followed by pcp;   nuclear stress test 07-13-2021 low risk no ischemia w/ nuclear ef 62%;  echo 07-13-2021 ef 55-60%, aortic valve w/ mild AR with mild to moderate sclerosis, RVSP 36.36mmHg   Peripheral vascular disease    Pre-diabetes    Prostate cancer 2018   urologist-- dr Ronne Binning;  first dx 02/ 2018 Gleason 3+3, PSA 6.4 active survillance;  MRI fusion bx 04-21-2021, Gleason 3+4, PSA 20.5   Renal cyst, acquired    bilateral   Stage 2 moderate COPD by GOLD classification    pulmonologist--- dr Sherene Sires;  emphysema still smoking ;  pt does not have oxygen (10-27-2021  pt stated trying to quit last cigarette in 7 days, had cut done to 3-4 cig per week)   Wears dentures    full upper and partial lower   Wears glasses     Past Surgical History:  Procedure Laterality  Date   COLONOSCOPY WITH PROPOFOL  03/28/2021   by dr Christella Hartigan   GOLD SEED IMPLANT N/A 11/30/2021   Procedure: GOLD SEED IMPLANT;  Surgeon: Malen Gauze, MD;  Location: [facility];  Service: Urology;  Laterality: N/A;   NO PAST SURGERIES     SPACE OAR INSTILLATION N/A 11/30/2021   Procedure: SPACE OAR INSTILLATION;  Surgeon: Malen Gauze, MD;  Location: [facility];  Service: Urology;  Laterality: N/A;   TRANSANAL EXCISION OF RECTAL MASS N/A 10/30/2021   Procedure: TRANSANAL EXCISION OF ANAL CANAL POLYPOID LESION;  Surgeon: Andria Meuse, MD;  Location: [facility];  Service: General;  Laterality: N/A;    Social History   Socioeconomic History   Marital status: Married    Spouse name: Not on file  Number of children: 1   Years of education: Not on file   Highest education level: Some college, no degree  Occupational History   Not on file  Tobacco Use   Smoking status: Some Days    Years: 67    Types: Cigarettes   Smokeless tobacco: Never   Tobacco comments:    10-27-2021  pt stated trying to quit, last cigarette 7 days ago,  had gotten down to 3-4 cig per week (pt started smoking age 51)  Vaping Use   Vaping Use: Never used  Substance and Sexual Activity   Alcohol use: Not Currently    Comment: occasional   Drug use: Not Currently    Types: Marijuana, Cocaine    Comment: 10-27-2021  pt stated last cocine age 20s and last marijuana age early 34s   Sexual activity: Yes  Other Topics Concern   Not on file  Social History Narrative   Not on file   Social Determinants of Health   Financial Resource Strain: Low Risk  (03/04/2023)   Overall Financial Resource Strain (CARDIA)    Difficulty of Paying Living Expenses: Not hard at all  Food Insecurity: No Food Insecurity (03/04/2023)   Hunger Vital Sign    Worried About Running Out of Food in the Last Year: Never true    Ran Out of Food in the Last Year: Never true  Transportation Needs: No Transportation  Needs (03/04/2023)   PRAPARE - Administrator, Civil Service (Medical): No    Lack of Transportation (Non-Medical): No  Physical Activity: Insufficiently Active (03/04/2023)   Exercise Vital Sign    Days of Exercise per Week: 2 days    Minutes of Exercise per Session: 20 min  Stress: No Stress Concern Present (03/04/2023)   [employer] of Occupational Health - Occupational Stress Questionnaire    Feeling of Stress : Not at all  Social Connections: Moderately Integrated (03/04/2023)   Social Connection and Isolation Panel [NHANES]    Frequency of Communication with Friends and Family: Twice a week    Frequency of Social Gatherings with Friends and Family: Once a week    Attends Religious Services: Never    Database administrator or Organizations: Yes    Attends Banker Meetings: 1 to 4 times per year    Marital Status: Married  Catering manager Violence: Not At Risk (03/26/2022)   Humiliation, Afraid, Rape, and Kick questionnaire    Fear of Current or Ex-Partner: No    Emotionally Abused: No    Physically Abused: No    Sexually Abused: No    Family History  Problem Relation Age of Onset   Colon cancer Neg Hx    Esophageal cancer Neg Hx    Rectal cancer Neg Hx    Stomach cancer Neg Hx    Colon polyps Neg Hx      Review of Systems  Constitutional: Negative.  Negative for chills and fever.  HENT: Negative.  Negative for congestion and sore throat.   Respiratory: Negative.  Negative for cough and shortness of breath.   Cardiovascular: Negative.  Negative for chest pain and palpitations.  Gastrointestinal:  Negative for abdominal pain, nausea and vomiting.  Genitourinary: Negative.  Negative for dysuria and hematuria.  Skin: Negative.   Neurological: Negative.  Negative for dizziness and headaches.  All other systems reviewed and are negative.   Vitals:   03/04/23 1613  BP: 130/72  Pulse: 67  Temp: 97.9 F (36.6  C)  SpO2: 97%    Physical  Exam Vitals reviewed.  Constitutional:      Appearance: Normal appearance.  HENT:     Head: Normocephalic.  Eyes:     Extraocular Movements: Extraocular movements intact.  Cardiovascular:     Rate and Rhythm: Normal rate.  Pulmonary:     Effort: Pulmonary effort is normal.  Musculoskeletal:     Comments: Right big toe: Positive swelling, erythema, and tenderness at first metatarsal phalangeal joint compatible with gouty arthritis  Skin:    General: Skin is warm and dry.  Neurological:     Mental Status: He is alert and oriented to person, place, and time.  Psychiatric:        Mood and Affect: Mood normal.        Behavior: Behavior normal.      ASSESSMENT & PLAN: A total of 33 minutes was spent with the patient and counseling/coordination of care regarding preparing for this visit, review of most recent office visit notes, review of multiple chronic medical conditions under management, review of all medications, diagnosis of acute gouty arthritis and treatment, pain management, prognosis, documentation and need for follow-up.  Problem List Items Addressed This Visit       Musculoskeletal and Integument   Acute gouty arthritis - Primary    Typical presentation. Acute and affecting quality of life Pain management discussed Recommend to start Medrol Dosepak and colchicine Advised to contact the office if no better or worse during the next 48 hours Diet and nutrition discussed      Relevant Medications   methylPREDNISolone (MEDROL DOSEPAK) 4 MG TBPK tablet   colchicine 0.6 MG tablet   Patient Instructions  Gout  Gout is painful swelling of your joints. Gout is a type of arthritis. It is caused by having too much uric acid in your body. Uric acid is a chemical that is made when your body breaks down substances called purines. If your body has too much uric acid, sharp crystals can form and build up in your joints. This causes pain and swelling. Gout attacks can happen  quickly and be very painful (acute gout). Over time, the attacks can affect more joints and happen more often (chronic gout). What are the causes? Gout is caused by too much uric acid in your blood. This can happen because: Your kidneys do not remove enough uric acid from your blood. Your body makes too much uric acid. You eat too many foods that are high in purines. These foods include organ meats, some seafood, and beer. Trauma or stress can bring on an attack. What increases the risk? Having a family history of gout. Being male and middle-aged. Being male and having gone through menopause. Having an organ transplant. Taking certain medicines. Having certain conditions, such as: Being very overweight (obese). Lead poisoning. Kidney disease. A skin condition called psoriasis. Other risks include: Losing weight too quickly. Not having enough water in the body (being dehydrated). Drinking alcohol, especially beer. Drinking beverages that are sweetened with a type of sugar called fructose. What are the signs or symptoms? An attack of acute gout often starts at night and usually happens in just one joint. The most common place is the big toe. Other joints that may be affected include joints of the feet, ankle, knee, fingers, wrist, or elbow. Symptoms may include: Very bad pain. Warmth. Swelling. Stiffness. Tenderness. The affected joint may be very painful to touch. Shiny, red, or purple skin. Chills and fever. Chronic  gout may cause symptoms more often. More joints may be involved. You may also have white or yellow lumps (tophi) on your hands or feet or in other areas near your joints. How is this treated? Treatment for an acute attack may include medicines for pain and swelling, such as: NSAIDs, such as ibuprofen. Steroids taken by mouth or injected into a joint. Colchicine. This can be given by mouth or through an IV tube. Treatment to prevent future attacks may  include: Taking small doses of NSAIDs or colchicine daily. Using a medicine that reduces uric acid levels in your blood, such as allopurinol. Making changes to your diet. You may need to see a food expert (dietitian) about what to eat and drink to prevent gout. Follow these instructions at home: During a gout attack  If told, put ice on the painful area. To do this: Put ice in a plastic bag. Place a towel between your skin and the bag. Leave the ice on for 20 minutes, 2-3 times a day. Take off the ice if your skin turns bright red. This is very important. If you cannot feel pain, heat, or cold, you have a greater risk of damage to the area. Raise the painful joint above the level of your heart as often as you can. Rest the joint as much as possible. If the joint is in your leg, you may be given crutches. Follow instructions from your doctor about what you cannot eat or drink. Avoiding future gout attacks Eat a low-purine diet. Avoid foods and drinks such as: Liver. Kidney. Anchovies. Asparagus. Herring. Mushrooms. Mussels. Beer. Stay at a healthy weight. If you want to lose weight, talk with your doctor. Do not lose weight too fast. Start or continue an exercise plan as told by your doctor. Eating and drinking Avoid drinks sweetened by fructose. Drink enough fluids to keep your pee (urine) pale yellow. If you drink alcohol: Limit how much you have to: 0-1 drink a day for women who are not pregnant. 0-2 drinks a day for men. Know how much alcohol is in a drink. In the [place] one drink equals one 12 oz bottle of beer (355 mL), one 5 oz glass of wine (148 mL), or one 1 oz glass of hard liquor (44 mL). General instructions Take over-the-counter and prescription medicines only as told by your doctor. Ask your doctor if you should avoid driving or using machines while you are taking your medicine. Return to your normal activities when your doctor says that it is safe. Keep all  follow-up visits. Where to find more information [employer] of [employer]: www.niams.http://www.myers.net/ Contact a doctor if: You have another gout attack. You still have symptoms of a gout attack after 10 days of treatment. You have problems (side effects) because of your medicines. You have chills or a fever. You have burning pain when you pee (urinate). You have pain in your lower back or belly. Get help right away if: You have very bad pain. Your pain cannot be controlled. You cannot pee. Summary Gout is painful swelling of the joints. The most common site of pain is the big toe, but it can affect other joints. Medicines and avoiding some foods can help to prevent and treat gout attacks. This information is not intended to replace advice given to you by your health care provider. Make sure you discuss any questions you have with your health care provider. Document Revised: 08/02/2021 Document Reviewed: 08/02/2021 Elsevier Patient Education  2023 [employer].  Agustina Caroli, MD [facility]

## 2023-03-04 NOTE — Telephone Encounter (Signed)
Please see below and advise.

## 2023-03-04 NOTE — Assessment & Plan Note (Signed)
Typical presentation. Acute and affecting quality of life Pain management discussed Recommend to start Medrol Dosepak and colchicine Advised to contact the office if no better or worse during the next 48 hours Diet and nutrition discussed

## 2023-03-04 NOTE — Telephone Encounter (Signed)
Patient called advising his last RX for silodosin (RAPAFLO) 8 MG CAPS capsule Was $30.00. Patient advised that was above the price he would be willing to pay, it was too expensive and wanted to know if there was a cheaper alternative.    Thank you

## 2023-03-05 ENCOUNTER — Other Ambulatory Visit: Payer: Self-pay | Admitting: Emergency Medicine

## 2023-03-05 ENCOUNTER — Other Ambulatory Visit: Payer: Self-pay | Admitting: Urology

## 2023-03-05 ENCOUNTER — Other Ambulatory Visit: Payer: Self-pay | Admitting: Cardiology

## 2023-03-05 DIAGNOSIS — R351 Nocturia: Secondary | ICD-10-CM

## 2023-03-05 DIAGNOSIS — I10 Essential (primary) hypertension: Secondary | ICD-10-CM

## 2023-03-05 DIAGNOSIS — I700 Atherosclerosis of aorta: Secondary | ICD-10-CM

## 2023-03-05 NOTE — Telephone Encounter (Signed)
Patient would like to try the Flomax BID, he states he had previously had issues with his BP dropping on Alfuzosin.  Do you think the Flomax will cause and issue with this?

## 2023-03-12 ENCOUNTER — Other Ambulatory Visit: Payer: Self-pay

## 2023-03-12 MED ORDER — TAMSULOSIN HCL 0.4 MG PO CAPS: 0.4000 mg | ORAL_CAPSULE | Freq: Two times a day (BID) | ORAL | 11 refills | Status: DC

## 2023-03-12 NOTE — Telephone Encounter (Signed)
Flomax rx sent to [facility] per pt request.  Patient will monitor his BP while taking the rx and will reach out to our office if he cannot tolerate rx.

## 2023-03-12 NOTE — Telephone Encounter (Signed)
Left a voicemail requesting a call back

## 2023-03-22 ENCOUNTER — Ambulatory Visit (INDEPENDENT_AMBULATORY_CARE_PROVIDER_SITE_OTHER): Payer: Medicare HMO

## 2023-03-22 ENCOUNTER — Encounter: Payer: Self-pay | Admitting: Family Medicine

## 2023-03-22 ENCOUNTER — Ambulatory Visit (INDEPENDENT_AMBULATORY_CARE_PROVIDER_SITE_OTHER): Payer: Medicare HMO | Admitting: Family Medicine

## 2023-03-22 VITALS — BP 156/72 | HR 94 | Temp 97.8°F | Ht 73.0 in | Wt 193.0 lb

## 2023-03-22 DIAGNOSIS — R053 Chronic cough: Secondary | ICD-10-CM

## 2023-03-22 DIAGNOSIS — R058 Other specified cough: Secondary | ICD-10-CM

## 2023-03-22 DIAGNOSIS — J309 Allergic rhinitis, unspecified: Secondary | ICD-10-CM | POA: Diagnosis not present

## 2023-03-22 DIAGNOSIS — R2689 Other abnormalities of gait and mobility: Secondary | ICD-10-CM

## 2023-03-22 DIAGNOSIS — R059 Cough, unspecified: Secondary | ICD-10-CM | POA: Diagnosis not present

## 2023-03-22 DIAGNOSIS — M79671 Pain in right foot: Secondary | ICD-10-CM | POA: Diagnosis not present

## 2023-03-22 DIAGNOSIS — M7989 Other specified soft tissue disorders: Secondary | ICD-10-CM | POA: Diagnosis not present

## 2023-03-22 NOTE — Progress Notes (Unsigned)
Subjective:  Andrew [name] is a 72 y.o. male who presents for chronic cough. Productive cough of white phlegm. Occasional shortness of breath with activity for the past 1-2 years. Denies any change   Using albuterol inhaler and it helps. He is now using it before activity which helps.   Takes Zyrtec daily.   Smoking only 2 cigarettes per day now.   Denies fever, chills, dizziness, chest pain, palpitations, abdominal pain, N/V/D, urinary symptoms, LE edema.   He also c/o right foot pain and swelling. Hx of gout in first MTP joint last month. Denies injury. Having trouble walking   ROS as in subjective.   Objective: Vitals:   03/22/23 0916  BP: (!) 156/72  Pulse: 94  Temp: 97.8 F (36.6 C)  SpO2: 98%    General appearance: Alert, WD/WN, no distress                             Skin: warm, no rash                           Head: no sinus tenderness                            Eyes: conjunctiva normal, corneas clear                            Ears: external ear canals normal                          Nose: septum midline, no discharge             Mouth/throat: MMM, tongue normal,without pharyngeal erythema                           Neck: supple, no adenopathy, no thyromegaly, nontender                          Heart: RRR                         Lungs: CTA bilaterally   Extremities: right lateral foot with swelling, TTP. Normal color, sensation, cap refill and pulse      Assessment: Chronic cough  Allergic rhinitis, unspecified seasonality, unspecified trigger  Productive cough - Plan: DG Chest 2 View  Acute pain of right foot - Plan: DG Foot Complete Right  Antalgic gait - Plan: DG Foot Complete Right   Plan: CXR and right foot X ray ordered.  Discussed most likely he has smokers cough.  Encouraged smoking cessation.  He also has allergies. Continue allergy medications.  Unclear etiology for foot pain and swelling. Consider gout but less likely. No injury.   Discussed conservative management.

## 2023-03-22 NOTE — Progress Notes (Signed)
Please check and see that he has a refill of his colchicine at the pharmacy.  I attempted to send in a new prescription but it said he had a refill.

## 2023-03-22 NOTE — Patient Instructions (Signed)
Please go downstairs for X rays of your chest and right foot.   Use ice and a topical pain medication such as Salon Pas with lidocaine or Voltaren gel or others on your foot. Elevate it when sitting and wear supportive shoes.   We will be in touch with your results.

## 2023-03-25 ENCOUNTER — Ambulatory Visit (INDEPENDENT_AMBULATORY_CARE_PROVIDER_SITE_OTHER): Payer: Medicare HMO

## 2023-03-25 ENCOUNTER — Telehealth: Payer: Self-pay

## 2023-03-25 ENCOUNTER — Telehealth: Payer: Self-pay | Admitting: Emergency Medicine

## 2023-03-25 DIAGNOSIS — R3915 Urgency of urination: Secondary | ICD-10-CM

## 2023-03-25 DIAGNOSIS — R350 Frequency of micturition: Secondary | ICD-10-CM

## 2023-03-25 MED ORDER — MIRABEGRON ER 25 MG PO TB24: 25.0000 mg | ORAL_TABLET | Freq: Every day | ORAL | 0 refills | Status: DC

## 2023-03-25 NOTE — Telephone Encounter (Signed)
Called pt and relayed recommendations, pt states he will stop by the store and do that.

## 2023-03-25 NOTE — Telephone Encounter (Signed)
Patient called advising he would like to speak with a clinical member. He states that he is experiencing urgency when he stands along with urgency when he is driving.

## 2023-03-25 NOTE — Telephone Encounter (Signed)
Patient added to [place] schedule for ua/uc urine drop off

## 2023-03-25 NOTE — Progress Notes (Signed)
Patient presents today with complaints of  urgency.  UA and Culture done today.  Dr. Ronne Binning reviewed results and gave verbal that culture was not needed and he recommended patient try Myrbetriq 25mg .  Samples provided at front desk and patient aware of MD response.    Guss Bunde, CMA

## 2023-03-25 NOTE — Telephone Encounter (Signed)
Looks like on chest CT you advised no change to the plan and to continue working on stopping smoking. Pt is wondering if anything can be sent in for the mucus in his chest?

## 2023-03-25 NOTE — Telephone Encounter (Signed)
Patient wants to know if anything will be sent in for the mucous in his chest.  Please advise  Patient's number  [phone]

## 2023-03-26 ENCOUNTER — Other Ambulatory Visit: Payer: Self-pay | Admitting: Cardiology

## 2023-03-26 DIAGNOSIS — I700 Atherosclerosis of aorta: Secondary | ICD-10-CM

## 2023-03-26 LAB — URINALYSIS, ROUTINE W REFLEX MICROSCOPIC
Bilirubin, UA: NEGATIVE
Glucose, UA: NEGATIVE
Ketones, UA: NEGATIVE
Leukocytes,UA: NEGATIVE
Nitrite, UA: NEGATIVE
Specific Gravity, UA: 1.025 (ref 1.005–1.030)
Urobilinogen, Ur: 0.2 mg/dL (ref 0.2–1.0)
pH, UA: 5.5 (ref 5.0–7.5)

## 2023-03-26 LAB — MICROSCOPIC EXAMINATION: Bacteria, UA: NONE SEEN

## 2023-04-03 ENCOUNTER — Ambulatory Visit (INDEPENDENT_AMBULATORY_CARE_PROVIDER_SITE_OTHER): Payer: Medicare HMO

## 2023-04-03 ENCOUNTER — Telehealth: Payer: Self-pay

## 2023-04-03 ENCOUNTER — Other Ambulatory Visit: Payer: Self-pay | Admitting: Family Medicine

## 2023-04-03 VITALS — Ht 73.0 in | Wt 193.0 lb

## 2023-04-03 DIAGNOSIS — Z0000 Encounter for general adult medical examination without abnormal findings: Secondary | ICD-10-CM

## 2023-04-03 DIAGNOSIS — Z122 Encounter for screening for malignant neoplasm of respiratory organs: Secondary | ICD-10-CM | POA: Diagnosis not present

## 2023-04-03 MED ORDER — AMOXICILLIN-POT CLAVULANATE 500-125 MG PO TABS: 1.0000 | ORAL_TABLET | Freq: Two times a day (BID) | ORAL | 0 refills | Status: DC

## 2023-04-03 NOTE — Telephone Encounter (Signed)
LM per DPR stating abx was sent to [facility] pharmacy. Advised if not back to baseline after completing to please schedule f/u. Provided office number for questions/concerns.

## 2023-04-03 NOTE — Progress Notes (Signed)
I connected with  Pauline Aus on 04/03/23 by a audio enabled telemedicine application and verified that I am speaking with the correct person using two identifiers.  Patient Location: Home  Provider Location: Office/Clinic  I discussed the limitations of evaluation and management by telemedicine. The patient expressed understanding and agreed to proceed.  Subjective:   CHORD MCCURTY is a 72 y.o. male who presents for Medicare Annual/Subsequent preventive examination.  Review of Systems     Cardiac Risk Factors include: advanced age (>86men, >75 women);dyslipidemia;hypertension;male gender;obesity (BMI >30kg/m2);sedentary lifestyle;smoking/ tobacco exposure     Objective:    Today's Vitals   04/03/23 1403  Weight: 193 lb (87.5 kg)  Height: 6\' 1"  (1.854 m)  PainSc: 0-No pain   Body mass index is 25.46 kg/m.     04/03/2023    2:05 PM 03/26/2022    3:43 PM 02/27/2022    2:41 PM 11/30/2021    9:24 AM 11/30/2021    9:16 AM 11/28/2021   11:10 AM 10/30/2021    9:05 AM  Advanced Directives  Does Patient Have a Medical Advance Directive? No No No  No No No  Would patient like information on creating a medical advance directive? No - Patient declined No - Patient declined  No - Patient declined No - Patient declined No - Patient declined Yes (MAU/Ambulatory/Procedural Areas - Information given)    Current Medications (verified) Outpatient Encounter Medications as of 04/03/2023  Medication Sig   acetaminophen (TYLENOL) 500 MG tablet Take 1,000 mg by mouth every 6 (six) hours as needed for moderate pain.   albuterol (PROVENTIL HFA;VENTOLIN HFA) 108 (90 Base) MCG/ACT inhaler Inhale 2 puffs into the lungs every 4 (four) hours as needed for wheezing or shortness of breath (cough, shortness of breath or wheezing.).   aspirin EC 81 MG tablet Take 1 tablet (81 mg total) by mouth daily. Swallow whole.   cetirizine (ZYRTEC) 10 MG tablet TAKE 1 TABLET EVERY DAY   diltiazem (DILACOR XR)  240 MG 24 hr capsule TAKE 1 CAPSULE EVERY DAY   fluticasone (FLONASE) 50 MCG/ACT nasal spray Place 1 spray into both nostrils daily.   mirabegron ER (MYRBETRIQ) 25 MG TB24 tablet Take 1 tablet (25 mg total) by mouth daily.   olmesartan (BENICAR) 40 MG tablet TAKE 1 TABLET EVERY DAY   omeprazole (PRILOSEC OTC) 20 MG tablet Take 20 mg by mouth daily as needed (acid reflux).   rosuvastatin (CRESTOR) 20 MG tablet Take 1 tablet (20 mg total) by mouth daily. Patient needs an appointment for further refills. 2 nd attempt   sildenafil (VIAGRA) 100 MG tablet Take 1 tablet (100 mg total) by mouth daily as needed for erectile dysfunction.   silodosin (RAPAFLO) 8 MG CAPS capsule Take 1 capsule (8 mg total) by mouth at bedtime.   solifenacin (VESICARE) 5 MG tablet TAKE 1 TABLET EVERY DAY   SYMBICORT 160-4.5 MCG/ACT inhaler INHALE 2 PUFFS FIRST THING IN THE MORNING AND THEN 2 PUFFS ABOUT 12 HOURS LATER AS DIRECTED   tadalafil (CIALIS) 20 MG tablet Take 1 tablet (20 mg total) by mouth daily as needed for erectile dysfunction.   tamsulosin (FLOMAX) 0.4 MG CAPS capsule Take 1 capsule (0.4 mg total) by mouth in the morning and at bedtime.   Tiotropium Bromide Monohydrate (SPIRIVA RESPIMAT) 2.5 MCG/ACT AERS Inhale 2 puffs into the lungs daily.   traMADol (ULTRAM) 50 MG tablet Take 1 tablet (50 mg total) by mouth every 6 (six) hours as needed.  triamcinolone cream (KENALOG) 0.1 % APPLY TO THE AFFECTED AREA(S) TOPICALLY TWICE DAILY   No facility-administered encounter medications on file as of 04/03/2023.    Allergies (verified) Lisinopril-hydrochlorothiazide   History: Past Medical History:  Diagnosis Date   Allergic rhinitis    Anal lesion    polypoid   Atherosclerosis of aorta (HCC)    Carotid artery stenosis, asymptomatic, bilateral 11/2017   last carotid ultrasound in epic 07-19-2021  right ECA >50% and left ICA 40-59% stenosis   CKD (chronic kidney disease), stage III (HCC)    followed by pcp;   previously followed by nephrologist--- dr Eulogio Ditch  (per pt lov appprox 2020)   DOE (dyspnea on exertion)    10-27-2021  pt stated has normal doe due to COPD, but past couple days it has even more so, Dr Sherene Sires started pt on prednisone today   ED (erectile dysfunction)    GERD (gastroesophageal reflux disease)    History of adenomatous polyp of colon    Hyperplasia of prostate with lower urinary tract symptoms (LUTS)    Hypertension    followed by pcp;   nuclear stress test 07-13-2021 low risk no ischemia w/ nuclear ef 62%;  echo 07-13-2021 ef 55-60%, aortic valve w/ mild AR with mild to moderate sclerosis, RVSP 36.63mmHg   Peripheral vascular disease (HCC)    Pre-diabetes    Prostate cancer [facility]) 2018   urologist-- dr Ronne Binning;  first dx 02/ 2018 Gleason 3+3, PSA 6.4 active survillance;  MRI fusion bx 04-21-2021, Gleason 3+4, PSA 20.5   Renal cyst, acquired    bilateral   Stage 2 moderate COPD by GOLD classification [facility])    pulmonologist--- dr Sherene Sires;  emphysema still smoking ;  pt does not have oxygen (10-27-2021  pt stated trying to quit last cigarette in 7 days, had cut done to 3-4 cig per week)   Wears dentures    full upper and partial lower   Wears glasses    Past Surgical History:  Procedure Laterality Date   COLONOSCOPY WITH PROPOFOL  03/28/2021   by dr Christella Hartigan   GOLD SEED IMPLANT N/A 11/30/2021   Procedure: GOLD SEED IMPLANT;  Surgeon: Malen Gauze, MD;  Location: [facility];  Service: Urology;  Laterality: N/A;   NO PAST SURGERIES     SPACE OAR INSTILLATION N/A 11/30/2021   Procedure: SPACE OAR INSTILLATION;  Surgeon: Malen Gauze, MD;  Location: [facility];  Service: Urology;  Laterality: N/A;   TRANSANAL EXCISION OF RECTAL MASS N/A 10/30/2021   Procedure: TRANSANAL EXCISION OF ANAL CANAL POLYPOID LESION;  Surgeon: Andria Meuse, MD;  Location: [facility];  Service: General;  Laterality: N/A;   Family History  Problem Relation Age of Onset    Colon cancer Neg Hx    Esophageal cancer Neg Hx    Rectal cancer Neg Hx    Stomach cancer Neg Hx    Colon polyps Neg Hx    Social History   Socioeconomic History   Marital status: Married    Spouse name: Not on file   Number of children: 1   Years of education: Not on file   Highest education level: Some college, no degree  Occupational History   Not on file  Tobacco Use   Smoking status: Some Days    Years: 38    Types: Cigarettes   Smokeless tobacco: Never   Tobacco comments:    10-27-2021  pt stated trying to quit, last cigarette 7 days  ago,  had gotten down to 3-4 cig per week (pt started smoking age 55)  Vaping Use   Vaping Use: Never used  Substance and Sexual Activity   Alcohol use: Not Currently    Comment: occasional   Drug use: Not Currently    Types: Marijuana, Cocaine    Comment: 10-27-2021  pt stated last cocine age 50s and last marijuana age early 7s   Sexual activity: Yes  Other Topics Concern   Not on file  Social History Narrative   Not on file   Social Determinants of Health   Financial Resource Strain: Low Risk  (04/03/2023)   Overall Financial Resource Strain (CARDIA)    Difficulty of Paying Living Expenses: Not hard at all  Food Insecurity: No Food Insecurity (04/03/2023)   Hunger Vital Sign    Worried About Running Out of Food in the Last Year: Never true    Ran Out of Food in the Last Year: Never true  Transportation Needs: No Transportation Needs (04/03/2023)   PRAPARE - Administrator, Civil Service (Medical): No    Lack of Transportation (Non-Medical): No  Physical Activity: Insufficiently Active (04/03/2023)   Exercise Vital Sign    Days of Exercise per Week: 2 days    Minutes of Exercise per Session: 20 min  Stress: No Stress Concern Present (04/03/2023)   [employer] of Occupational Health - Occupational Stress Questionnaire    Feeling of Stress : Not at all  Social Connections: Moderately Integrated (04/03/2023)    Social Connection and Isolation Panel [NHANES]    Frequency of Communication with Friends and Family: Twice a week    Frequency of Social Gatherings with Friends and Family: Once a week    Attends Religious Services: Never    Database administrator or Organizations: Yes    Attends Banker Meetings: 1 to 4 times per year    Marital Status: Married    Tobacco Counseling Ready to quit: Not Answered Counseling given: Not Answered Tobacco comments: 10-27-2021  pt stated trying to quit, last cigarette 7 days ago,  had gotten down to 3-4 cig per week (pt started smoking age 61)   Clinical Intake:  Pre-visit preparation completed: Yes  Pain : No/denies pain Pain Score: 0-No pain     BMI - recorded: 25.46 Nutritional Status: BMI 25 -29 Overweight Nutritional Risks: None Diabetes: No  How often do you need to have someone help you when you read instructions, pamphlets, or other written materials from your doctor or pharmacy?: 1 - Never What is the last grade level you completed in school?: HSG  Diabetic? No  Interpreter Needed?: No  Information entered by :: Delaynie Stetzer N. Rayven Hendrickson, LPN.   Activities of Daily Living    04/03/2023    2:21 PM  In your present state of health, do you have any difficulty performing the following activities:  Hearing? 0  Vision? 0  Difficulty concentrating or making decisions? 0  Walking or climbing stairs? 0  Dressing or bathing? 0  Doing errands, shopping? 0  Preparing Food and eating ? N  Using the Toilet? N  In the past six months, have you accidently leaked urine? N  Do you have problems with loss of bowel control? N  Managing your Medications? N  Managing your Finances? N  Housekeeping or managing your Housekeeping? N    Patient Care Team: Georgina Quint, MD as PCP - General (Internal Medicine) Ronne Binning Mardene Celeste, MD as  Consulting Physician (Urology) Elder Negus, MD as Consulting Physician  (Cardiology) Nyoka Cowden, MD as Consulting Physician (Pulmonary Disease) Andria Meuse, MD as Consulting Physician (General Surgery) Margaretmary Dys, MD as Consulting Physician (Radiation Oncology) Axel Filler, Larna Daughters, NP as Nurse Practitioner (Hematology and Oncology) Maryclare Labrador, RN as Registered Nurse [facility] as Consulting Physician (Optometry)  Indicate any recent Medical Services you may have received from other than [facility] providers in the past year (date may be approximate).     Assessment:   This is a routine wellness examination for Alameen.  Hearing/Vision screen Hearing Screening - Comments:: Denies hearing difficulties.   Vision Screening - Comments:: Wears rx glasses - up to date with routine eye exams with [facility]   Dietary issues and exercise activities discussed: Current Exercise Habits: Home exercise routine, Type of exercise: walking, Time (Minutes): 20, Frequency (Times/Week): 2, Weekly Exercise (Minutes/Week): 40, Intensity: Moderate, Exercise limited by: respiratory conditions(s);orthopedic condition(s)   Goals Addressed             This Visit's Progress    My goal for 2024 is to be more physically active and control my COPD.       10 Tips for Managing COPD: Handout mailed to patient.       Depression Screen    04/03/2023    2:06 PM 03/22/2023    9:21 AM 03/04/2023    4:14 PM 11/30/2022    2:13 PM 08/02/2022    2:41 PM 03/26/2022    3:44 PM 10/23/2021    4:39 PM  PHQ 2/9 Scores  PHQ - 2 Score 0 0 0 0 0 0 0  PHQ- 9 Score 0          Fall Risk    04/03/2023    2:06 PM 03/22/2023    9:22 AM 03/04/2023    4:14 PM 11/30/2022    2:13 PM 08/02/2022    2:41 PM  Fall Risk   Falls in the past year? 0 0 0 0 0  Number falls in past yr: 0 0 0 0 0  Injury with Fall? 0 0 0 0 0  Risk for fall due to : No Fall Risks No Fall Risks No Fall Risks No Fall Risks No Fall Risks  Follow up Falls prevention discussed Falls  evaluation completed Falls evaluation completed Falls evaluation completed Falls evaluation completed    FALL RISK PREVENTION PERTAINING TO THE HOME:  Any stairs in or around the home? Yes  If so, are there any without handrails? No  Home free of loose throw rugs in walkways, pet beds, electrical cords, etc? Yes  Adequate lighting in your home to reduce risk of falls? Yes   ASSISTIVE DEVICES UTILIZED TO PREVENT FALLS:  Life alert? No  Use of a cane, walker or w/c? No  Grab bars in the bathroom? No  Shower chair or bench in shower? No  Elevated toilet seat or a handicapped toilet? No   TIMED UP AND GO:  Was the test performed? No . Telephonic Visit  Cognitive Function:        04/03/2023    2:23 PM 10/11/2020    2:04 PM 10/02/2019    9:28 AM 09/02/2017    3:18 PM  6CIT Screen  What Year? 0 points 0 points 0 points 0 points  What month? 0 points 0 points 0 points 0 points  What time? 0 points 0 points 0 points 0 points  Count back  from 20 0 points 0 points 0 points 0 points  Months in reverse 0 points 0 points 0 points 0 points  Repeat phrase 0 points 0 points 0 points 0 points  Total Score 0 points 0 points 0 points 0 points    Immunizations Immunization History  Administered Date(s) Administered   Fluad Quad(high Dose 65+) 09/06/2020, 08/23/2021, 08/02/2022   Influenza,inj,Quad PF,6+ Mos 01/24/2016, 09/02/2017   PFIZER(Purple Top)SARS-COV-2 Vaccination 12/18/2019, 01/08/2020   PNEUMOCOCCAL CONJUGATE-20 04/20/2021   Pneumococcal Conjugate-13 09/02/2017   Pneumococcal Polysaccharide-23 03/29/2020   Tdap 03/29/2020    TDAP status: Up to date  Flu Vaccine status: Up to date  Pneumococcal vaccine status: Up to date  Covid-19 vaccine status: Completed vaccines  Qualifies for Shingles Vaccine? Yes   Zostavax completed No   Shingrix Completed?: No.    Education has been provided regarding the importance of this vaccine. Patient has been advised to call insurance  company to determine out of pocket expense if they have not yet received this vaccine. Advised may also receive vaccine at local pharmacy or Health Dept. Verbalized acceptance and understanding.  Screening Tests Health Maintenance  Topic Date Due   Fecal DNA (Cologuard)  Never done   COVID-19 Vaccine (3 - Pfizer risk series) 04/07/2023 (Originally 02/05/2020)   Zoster Vaccines- Shingrix (1 of 2) 06/03/2023 (Originally 02/05/1970)   INFLUENZA VACCINE  06/13/2023   Medicare Annual Wellness (AWV)  04/02/2024   DTaP/Tdap/Td (2 - Td or Tdap) 03/29/2030   Pneumonia Vaccine 53+ Years old  Completed   Hepatitis C Screening  Completed   HPV VACCINES  Aged Out    Health Maintenance  Health Maintenance Due  Topic Date Due   Fecal DNA (Cologuard)  Never done    Colorectal cancer screening: Type of screening: Colonoscopy. Completed 03/28/2021. Repeat every 7 years  Lung Cancer Screening: (Low Dose CT Chest recommended if Age 28-80 years, 30 pack-year currently smoking OR have quit w/in 15years.) does qualify.   Lung Cancer Screening Referral: ordered 04/03/2023  Additional Screening:  Hepatitis C Screening: does qualify; Completed 01/24/2016  Vision Screening: Recommended annual ophthalmology exams for early detection of glaucoma and other disorders of the eye. Is the patient up to date with their annual eye exam?  Yes  Who is the provider or what is the name of the office in which the patient attends annual eye exams? [facility] If pt is not established with a provider, would they like to be referred to a provider to establish care? No .   Dental Screening: Recommended annual dental exams for proper oral hygiene  Community Resource Referral / Chronic Care Management: CRR required this visit?  No   CCM required this visit?  No      Plan:     I have personally reviewed and noted the following in the patient's chart:   Medical and social history Use of alcohol, tobacco or  illicit drugs  Current medications and supplements including opioid prescriptions. Patient is not currently taking opioid prescriptions. Functional ability and status Nutritional status Physical activity Advanced directives List of other physicians Hospitalizations, surgeries, and ER visits in previous 12 months Vitals Screenings to include cognitive, depression, and falls Referrals and appointments  In addition, I have reviewed and discussed with patient certain preventive protocols, quality metrics, and best practice recommendations. A written personalized care plan for preventive services as well as general preventive health recommendations were provided to patient.     Mickeal Needy, LPN  04/03/2023   Nurse Notes: Normal cognitive status assessed by direct observation by this Nurse Health Advisor. No abnormalities found.

## 2023-04-03 NOTE — Telephone Encounter (Signed)
Patient c/o continuous yellow, thick sputum, nagging headache and coughing.  Patient stated that he is doing Mucinex and drinking plenty of water.  Are there any other suggestions to r/o sinus infection?  Ajla Mcgeachy N. Bernetta Sutley, LPN. [facility] AWV Team Direct Dial: [phone]

## 2023-04-03 NOTE — Patient Instructions (Addendum)
Andrew [name] , Thank you for taking time to come for your Medicare Wellness Visit. I appreciate your ongoing commitment to your health goals. Please review the following plan we discussed and let me know if I can assist you in the future.   These are the goals we discussed:  Goals      My goal for 2024 is to be more physically active and control my COPD.     10 Tips for Managing COPD: Handout mailed to patient.         This is a list of the screening recommended for you and due dates:  Health Maintenance  Topic Date Due   Cologuard (Stool DNA test)  Never done   COVID-19 Vaccine (3 - Pfizer risk series) 04/07/2023*   Zoster (Shingles) Vaccine (1 of 2) 06/03/2023*   Flu Shot  06/13/2023   Medicare Annual Wellness Visit  04/02/2024   DTaP/Tdap/Td vaccine (2 - Td or Tdap) 03/29/2030   Pneumonia Vaccine  Completed   Hepatitis C Screening: USPSTF Recommendation to screen - Ages 29-79 yo.  Completed   HPV Vaccine  Aged Out  *Topic was postponed. The date shown is not the original due date.    Advanced directives: No  Conditions/risks identified: Yes, COPD (handout mailed to patient)  Next appointment: Follow up in one year for your annual wellness visit.   Preventive Care 72 Years and Older, Male  Preventive care refers to lifestyle choices and visits with your health care provider that can promote health and wellness. What does preventive care include? A yearly physical exam. This is also called an annual well check. Dental exams once or twice a year. Routine eye exams. Ask your health care provider how often you should have your eyes checked. Personal lifestyle choices, including: Daily care of your teeth and gums. Regular physical activity. Eating a healthy diet. Avoiding tobacco and drug use. Limiting alcohol use. Practicing safe sex. Taking low doses of aspirin every day. Taking vitamin and mineral supplements as recommended by your health care provider. What happens  during an annual well check? The services and screenings done by your health care provider during your annual well check will depend on your age, overall health, lifestyle risk factors, and family history of disease. Counseling  Your health care provider may ask you questions about your: Alcohol use. Tobacco use. Drug use. Emotional well-being. Home and relationship well-being. Sexual activity. Eating habits. History of falls. Memory and ability to understand (cognition). Work and work Astronomer. Screening  You may have the following tests or measurements: Height, weight, and BMI. Blood pressure. Lipid and cholesterol levels. These may be checked every 5 years, or more frequently if you are over 36 years old. Skin check. Lung cancer screening. You may have this screening every year starting at age 51 if you have a 30-pack-year history of smoking and currently smoke or have quit within the past 15 years. Fecal occult blood test (FOBT) of the stool. You may have this test every year starting at age 63. Flexible sigmoidoscopy or colonoscopy. You may have a sigmoidoscopy every 5 years or a colonoscopy every 10 years starting at age 35. Prostate cancer screening. Recommendations will vary depending on your family history and other risks. Hepatitis C blood test. Hepatitis B blood test. Sexually transmitted disease (STD) testing. Diabetes screening. This is done by checking your blood sugar (glucose) after you have not eaten for a while (fasting). You may have this done every 1-3 years. Abdominal aortic  aneurysm (AAA) screening. You may need this if you are a current or former smoker. Osteoporosis. You may be screened starting at age 87 if you are at high risk. Talk with your health care provider about your test results, treatment options, and if necessary, the need for more tests. Vaccines  Your health care provider may recommend certain vaccines, such as: Influenza vaccine. This is  recommended every year. Tetanus, diphtheria, and acellular pertussis (Tdap, Td) vaccine. You may need a Td booster every 10 years. Zoster vaccine. You may need this after age 73. Pneumococcal 13-valent conjugate (PCV13) vaccine. One dose is recommended after age 26. Pneumococcal polysaccharide (PPSV23) vaccine. One dose is recommended after age 70. Talk to your health care provider about which screenings and vaccines you need and how often you need them. This information is not intended to replace advice given to you by your health care provider. Make sure you discuss any questions you have with your health care provider. Document Released: 11/25/2015 Document Revised: 07/18/2016 Document Reviewed: 08/30/2015 Elsevier Interactive Patient Education  2017 [employer].  Fall Prevention in the Home Falls can cause injuries. They can happen to people of all ages. There are many things you can do to make your home safe and to help prevent falls. What can I do on the outside of my home? Regularly fix the edges of walkways and driveways and fix any cracks. Remove anything that might make you trip as you walk through a door, such as a raised step or threshold. Trim any bushes or trees on the path to your home. Use bright outdoor lighting. Clear any walking paths of anything that might make someone trip, such as rocks or tools. Regularly check to see if handrails are loose or broken. Make sure that both sides of any steps have handrails. Any raised decks and porches should have guardrails on the edges. Have any leaves, snow, or ice cleared regularly. Use sand or salt on walking paths during winter. Clean up any spills in your garage right away. This includes oil or grease spills. What can I do in the bathroom? Use night lights. Install grab bars by the toilet and in the tub and shower. Do not use towel bars as grab bars. Use non-skid mats or decals in the tub or shower. If you need to sit down in  the shower, use a plastic, non-slip stool. Keep the floor dry. Clean up any water that spills on the floor as soon as it happens. Remove soap buildup in the tub or shower regularly. Attach bath mats securely with double-sided non-slip rug tape. Do not have throw rugs and other things on the floor that can make you trip. What can I do in the bedroom? Use night lights. Make sure that you have a light by your bed that is easy to reach. Do not use any sheets or blankets that are too big for your bed. They should not hang down onto the floor. Have a firm chair that has side arms. You can use this for support while you get dressed. Do not have throw rugs and other things on the floor that can make you trip. What can I do in the kitchen? Clean up any spills right away. Avoid walking on wet floors. Keep items that you use a lot in easy-to-reach places. If you need to reach something above you, use a strong step stool that has a grab bar. Keep electrical cords out of the way. Do not use floor  polish or wax that makes floors slippery. If you must use wax, use non-skid floor wax. Do not have throw rugs and other things on the floor that can make you trip. What can I do with my stairs? Do not leave any items on the stairs. Make sure that there are handrails on both sides of the stairs and use them. Fix handrails that are broken or loose. Make sure that handrails are as long as the stairways. Check any carpeting to make sure that it is firmly attached to the stairs. Fix any carpet that is loose or worn. Avoid having throw rugs at the top or bottom of the stairs. If you do have throw rugs, attach them to the floor with carpet tape. Make sure that you have a light switch at the top of the stairs and the bottom of the stairs. If you do not have them, ask someone to add them for you. What else can I do to help prevent falls? Wear shoes that: Do not have high heels. Have rubber bottoms. Are comfortable  and fit you well. Are closed at the toe. Do not wear sandals. If you use a stepladder: Make sure that it is fully opened. Do not climb a closed stepladder. Make sure that both sides of the stepladder are locked into place. Ask someone to hold it for you, if possible. Clearly mark and make sure that you can see: Any grab bars or handrails. First and last steps. Where the edge of each step is. Use tools that help you move around (mobility aids) if they are needed. These include: Canes. Walkers. Scooters. Crutches. Turn on the lights when you go into a dark area. Replace any light bulbs as soon as they burn out. Set up your furniture so you have a clear path. Avoid moving your furniture around. If any of your floors are uneven, fix them. If there are any pets around you, be aware of where they are. Review your medicines with your doctor. Some medicines can make you feel dizzy. This can increase your chance of falling. Ask your doctor what other things that you can do to help prevent falls. This information is not intended to replace advice given to you by your health care provider. Make sure you discuss any questions you have with your health care provider. Document Released: 08/25/2009 Document Revised: 04/05/2016 Document Reviewed: 12/03/2014 Elsevier Interactive Patient Education  2017 [employer].

## 2023-05-15 ENCOUNTER — Ambulatory Visit: Payer: Medicare HMO | Admitting: Urology

## 2023-05-15 VITALS — BP 128/56 | HR 91

## 2023-05-15 DIAGNOSIS — N401 Enlarged prostate with lower urinary tract symptoms: Secondary | ICD-10-CM

## 2023-05-15 DIAGNOSIS — R351 Nocturia: Secondary | ICD-10-CM | POA: Diagnosis not present

## 2023-05-15 DIAGNOSIS — C61 Malignant neoplasm of prostate: Secondary | ICD-10-CM

## 2023-05-15 DIAGNOSIS — N138 Other obstructive and reflux uropathy: Secondary | ICD-10-CM | POA: Diagnosis not present

## 2023-05-15 DIAGNOSIS — N5201 Erectile dysfunction due to arterial insufficiency: Secondary | ICD-10-CM | POA: Diagnosis not present

## 2023-05-15 LAB — URINALYSIS, ROUTINE W REFLEX MICROSCOPIC
Bilirubin, UA: NEGATIVE
Glucose, UA: NEGATIVE
Leukocytes,UA: NEGATIVE
Nitrite, UA: NEGATIVE
Specific Gravity, UA: 1.025 (ref 1.005–1.030)
Urobilinogen, Ur: 0.2 mg/dL (ref 0.2–1.0)
pH, UA: 5.5 (ref 5.0–7.5)

## 2023-05-15 LAB — MICROSCOPIC EXAMINATION: Bacteria, UA: NONE SEEN

## 2023-05-15 MED ORDER — SILDENAFIL CITRATE 100 MG PO TABS
100.0000 mg | ORAL_TABLET | Freq: Every day | ORAL | 5 refills | Status: DC | PRN
Start: 2023-05-15 — End: 2024-05-22

## 2023-05-15 MED ORDER — TAMSULOSIN HCL 0.4 MG PO CAPS
0.4000 mg | ORAL_CAPSULE | Freq: Two times a day (BID) | ORAL | 11 refills | Status: DC
Start: 2023-05-15 — End: 2023-11-22

## 2023-05-15 MED ORDER — SOLIFENACIN SUCCINATE 5 MG PO TABS: 5.0000 mg | ORAL_TABLET | Freq: Every day | ORAL | 3 refills | Status: DC

## 2023-05-15 NOTE — Progress Notes (Signed)
05/15/2023 10:41 AM   Andrew [name] 06-Nov-1951 [id number]  Referring provider: Georgina Quint, MD [address] [place],  [place] [address]  Prostate cancer and BPH   HPI: Andrew [name] is a 72yo here for followup for prostate cancer and BPH. PSA was 4.9 six months ago. IPSS 7 QOL 1 on flomax 0.4mg  BID and vesicare 5mg . He uses sildenafil 100mg  prn with good results   PMH: Past Medical History:  Diagnosis Date   Allergic rhinitis    Anal lesion    polypoid   Atherosclerosis of aorta (HCC)    Carotid artery stenosis, asymptomatic, bilateral 11/2017   last carotid ultrasound in epic 07-19-2021  right ECA >50% and left ICA 40-59% stenosis   CKD (chronic kidney disease), stage III (HCC)    followed by pcp;  previously followed by nephrologist--- dr Eulogio Ditch  (per pt lov appprox 2020)   DOE (dyspnea on exertion)    10-27-2021  pt stated has normal doe due to COPD, but past couple days it has even more so, Dr Sherene Sires started pt on prednisone today   ED (erectile dysfunction)    GERD (gastroesophageal reflux disease)    History of adenomatous polyp of colon    Hyperplasia of prostate with lower urinary tract symptoms (LUTS)    Hypertension    followed by pcp;   nuclear stress test 07-13-2021 low risk no ischemia w/ nuclear ef 62%;  echo 07-13-2021 ef 55-60%, aortic valve w/ mild AR with mild to moderate sclerosis, RVSP 36.56mmHg   Peripheral vascular disease (HCC)    Pre-diabetes    Prostate cancer [facility]) 2018   urologist-- dr Ronne Binning;  first dx 02/ 2018 Gleason 3+3, PSA 6.4 active survillance;  MRI fusion bx 04-21-2021, Gleason 3+4, PSA 20.5   Renal cyst, acquired    bilateral   Stage 2 moderate COPD by GOLD classification [facility])    pulmonologist--- dr Sherene Sires;  emphysema still smoking ;  pt does not have oxygen (10-27-2021  pt stated trying to quit last cigarette in 7 days, had cut done to 3-4 cig per week)   Wears dentures    full upper and partial lower   Wears glasses      Surgical History: Past Surgical History:  Procedure Laterality Date   COLONOSCOPY WITH PROPOFOL  03/28/2021   by dr Christella Hartigan   GOLD SEED IMPLANT N/A 11/30/2021   Procedure: GOLD SEED IMPLANT;  Surgeon: Malen Gauze, MD;  Location: [facility];  Service: Urology;  Laterality: N/A;   NO PAST SURGERIES     SPACE OAR INSTILLATION N/A 11/30/2021   Procedure: SPACE OAR INSTILLATION;  Surgeon: Malen Gauze, MD;  Location: [facility];  Service: Urology;  Laterality: N/A;   TRANSANAL EXCISION OF RECTAL MASS N/A 10/30/2021   Procedure: TRANSANAL EXCISION OF ANAL CANAL POLYPOID LESION;  Surgeon: Andria Meuse, MD;  Location: [facility];  Service: General;  Laterality: N/A;    Home Medications:  Allergies as of 05/15/2023       Reactions   Lisinopril-hydrochlorothiazide    dizziness        Medication List        Accurate as of May 15, 2023 10:41 AM. If you have any questions, ask your nurse or doctor.          acetaminophen 500 MG tablet Commonly known as: TYLENOL Take 1,000 mg by mouth every 6 (six) hours as needed for moderate pain.   albuterol 108 (90 Base) MCG/ACT  inhaler Commonly known as: VENTOLIN HFA Inhale 2 puffs into the lungs every 4 (four) hours as needed for wheezing or shortness of breath (cough, shortness of breath or wheezing.).   amoxicillin-clavulanate 500-125 MG tablet Commonly known as: Augmentin Take 1 tablet by mouth 2 (two) times daily.   aspirin EC 81 MG tablet Take 1 tablet (81 mg total) by mouth daily. Swallow whole.   cetirizine 10 MG tablet Commonly known as: ZYRTEC TAKE 1 TABLET EVERY DAY   diltiazem 240 MG 24 hr capsule Commonly known as: DILACOR XR TAKE 1 CAPSULE EVERY DAY   fluticasone 50 MCG/ACT nasal spray Commonly known as: FLONASE Place 1 spray into both nostrils daily.   mirabegron ER 25 MG Tb24 tablet Commonly known as: MYRBETRIQ Take 1 tablet (25 mg total) by mouth daily.   olmesartan 40 MG  tablet Commonly known as: BENICAR TAKE 1 TABLET EVERY DAY   omeprazole 20 MG tablet Commonly known as: PRILOSEC OTC Take 20 mg by mouth daily as needed (acid reflux).   rosuvastatin 20 MG tablet Commonly known as: CRESTOR Take 1 tablet (20 mg total) by mouth daily. Patient needs an appointment for further refills. 2 nd attempt   sildenafil 100 MG tablet Commonly known as: VIAGRA Take 1 tablet (100 mg total) by mouth daily as needed for erectile dysfunction.   silodosin 8 MG Caps capsule Commonly known as: RAPAFLO Take 1 capsule (8 mg total) by mouth at bedtime.   solifenacin 5 MG tablet Commonly known as: VESICARE TAKE 1 TABLET EVERY DAY   Spiriva Respimat 2.5 MCG/ACT Aers Generic drug: Tiotropium Bromide Monohydrate Inhale 2 puffs into the lungs daily.   Symbicort 160-4.5 MCG/ACT inhaler Generic drug: budesonide-formoterol INHALE 2 PUFFS FIRST THING IN THE MORNING AND THEN 2 PUFFS ABOUT 12 HOURS LATER AS DIRECTED   tadalafil 20 MG tablet Commonly known as: CIALIS Take 1 tablet (20 mg total) by mouth daily as needed for erectile dysfunction.   tamsulosin 0.4 MG Caps capsule Commonly known as: FLOMAX Take 1 capsule (0.4 mg total) by mouth in the morning and at bedtime.   traMADol 50 MG tablet Commonly known as: ULTRAM Take 1 tablet (50 mg total) by mouth every 6 (six) hours as needed.   triamcinolone cream 0.1 % Commonly known as: KENALOG APPLY TO THE AFFECTED AREA(S) TOPICALLY TWICE DAILY        Allergies:  Allergies  Allergen Reactions   Lisinopril-Hydrochlorothiazide     dizziness    Family History: Family History  Problem Relation Age of Onset   Colon cancer Neg Hx    Esophageal cancer Neg Hx    Rectal cancer Neg Hx    Stomach cancer Neg Hx    Colon polyps Neg Hx     Social History:  reports that he has been smoking cigarettes. He has never used smokeless tobacco. He reports that he does not currently use alcohol. He reports that he does not  currently use drugs after having used the following drugs: Marijuana and Cocaine.  ROS: All other review of systems were reviewed and are negative except what is noted above in HPI  Physical Exam: BP (!) 128/56   Pulse 91   Constitutional:  Alert and oriented, No acute distress. HEENT: [place] AT, moist mucus membranes.  Trachea midline, no masses. Cardiovascular: No clubbing, cyanosis, or edema. Respiratory: Normal respiratory effort, no increased work of breathing. GI: Abdomen is soft, nontender, nondistended, no abdominal masses GU: No CVA tenderness.  Lymph: No cervical  or inguinal lymphadenopathy. Skin: No rashes, bruises or suspicious lesions. Neurologic: Grossly intact, no focal deficits, moving all 4 extremities. Psychiatric: Normal mood and affect.  Laboratory Data: Lab Results  Component Value Date   WBC 6.3 07/08/2018   HGB 13.9 10/30/2021   HCT 41.0 10/30/2021   MCV 85.3 07/08/2018   PLT 268.0 07/08/2018    Lab Results  Component Value Date   CREATININE 1.77 (H) 11/28/2021    Lab Results  Component Value Date   PSA 5.4 (H) 09/14/2016   PSA 5.58 (H) 01/24/2016    No results found for: "TESTOSTERONE"  Lab Results  Component Value Date   HGBA1C 5.6 10/02/2019    Urinalysis    Component Value Date/Time   APPEARANCEUR Clear 03/25/2023 1432   GLUCOSEU Negative 03/25/2023 1432   BILIRUBINUR Negative 03/25/2023 1432   KETONESUR negative 10/17/2017 1615   PROTEINUR 1+ (A) 03/25/2023 1432   UROBILINOGEN 0.2 10/17/2017 1615   NITRITE Negative 03/25/2023 1432   LEUKOCYTESUR Negative 03/25/2023 1432    Lab Results  Component Value Date   LABMICR See below: 03/25/2023   WBCUA 0-5 03/25/2023   LABEPIT 0-10 03/25/2023   MUCUS Present 06/09/2021   BACTERIA None seen 03/25/2023    Pertinent Imaging:  No results found for this or any previous visit.  No results found for this or any previous visit.  No results found for this or any previous visit.  No  results found for this or any previous visit.  Results for orders placed during the hospital encounter of 05/27/20  US RENAL  Narrative CLINICAL DATA:  CKD stage 3  EXAM: RENAL / URINARY TRACT ULTRASOUND COMPLETE  COMPARISON:  Chest CT acquired on September 25, 2019, limited assessment of the kidneys at that time.  FINDINGS: Right Kidney:  Renal measurements: 10.7 x 4.0 x 4.5 cm = volume: 102 mL. Small hypoechoic to anechoic focus in the upper pole of the RIGHT kidney shows some central increased echogenicity and perhaps multi septate changes.  3.9 x 3.3 x 2.7 cm anechoic cyst with adjacent smaller cystic area 1.2 x 1.3 x 1.3 cm cannot be classified as a simple cyst and does not show overt signs of flow. No hydronephrosis. Decreased corticomedullary differentiation.  Left Kidney:  Renal measurements: 10.6 x 5.7 x 4.8 cm = volume: 151 mL. Decreased corticomedullary differentiation without hydronephrosis. Small mildly complex areas with multi septate or spongiform appearance in the upper pole and interpolar portion largest measuring 1.6 x 1.4 x 1.5 cm with smaller areas, less than 10 also showing some cystic characteristics.  Bladder:  Appears normal for degree of bladder distention.  Other:  None.  IMPRESSION: 1. Decreased corticomedullary differentiation without hydronephrosis. Findings can be seen in the setting of medical renal disease. 2. Multiple cystic lesions in the bilateral kidneys without renal enlargement. Some of these areas display internal complexity/variable echogenicity. MRI may be helpful for further evaluation to exclude the presence of enhancing components/small cystic renal neoplasm.  These results will be called to the ordering clinician or representative by the Radiologist Assistant, and communication documented in the PACS or [employer].   Electronically Signed By: Donzetta Kohut M.D. On: 05/30/2020 10:55  No valid procedures  specified. No results found for this or any previous visit.  No results found for this or any previous visit.   Assessment & Plan:    1. Prostate cancer (HCC) PSa today, followup 6 months with PSA - Urinalysis, Routine w reflex microscopic  2. Benign  prostatic hyperplasia with urinary obstruction Continue flomax 0.4mg  daily  3. Erectile dysfunction due to arterial insufficiency Continue sildenafil 100mg  prn  4. Nocturia -continue vesicare 5mg  daily   No follow-ups on file.  Wilkie Aye, MD  [facility] [place]

## 2023-05-16 LAB — PSA: Prostate Specific Ag, Serum: 2.1 ng/mL (ref 0.0–4.0)

## 2023-05-21 ENCOUNTER — Encounter: Payer: Self-pay | Admitting: Urology

## 2023-05-21 NOTE — Patient Instructions (Signed)

## 2023-06-03 ENCOUNTER — Ambulatory Visit: Payer: Medicare HMO | Admitting: Emergency Medicine

## 2023-06-03 ENCOUNTER — Ambulatory Visit (INDEPENDENT_AMBULATORY_CARE_PROVIDER_SITE_OTHER): Payer: Medicare HMO

## 2023-06-03 ENCOUNTER — Encounter: Payer: Self-pay | Admitting: Emergency Medicine

## 2023-06-03 VITALS — BP 138/78 | HR 76 | Temp 98.6°F | Ht 73.0 in | Wt 191.5 lb

## 2023-06-03 DIAGNOSIS — M25512 Pain in left shoulder: Secondary | ICD-10-CM | POA: Insufficient documentation

## 2023-06-03 DIAGNOSIS — M19012 Primary osteoarthritis, left shoulder: Secondary | ICD-10-CM | POA: Diagnosis not present

## 2023-06-03 NOTE — Progress Notes (Signed)
Pauline Aus 72 y.o.   Chief Complaint  Patient presents with   Shoulder Pain    Patient is having left shoulder pain, he states it has been hurting for a while, last week it got worse.     HISTORY OF PRESENT ILLNESS: This is a 72 y.o. male complaining of chronic pain to left shoulder but worse the last week.  Denies direct injury No other associated symptoms No other complaints or medical concerns today.  Shoulder Pain  Pertinent negatives include no fever.     Prior to Admission medications   Medication Sig Start Date End Date Taking? Authorizing Provider  acetaminophen (TYLENOL) 500 MG tablet Take 1,000 mg by mouth every 6 (six) hours as needed for moderate pain.   Yes [provider]  albuterol (PROVENTIL HFA;VENTOLIN HFA) 108 (90 Base) MCG/ACT inhaler Inhale 2 puffs into the lungs every 4 (four) hours as needed for wheezing or shortness of breath (cough, shortness of breath or wheezing.). 06/29/17  Yes Sherren Mocha, MD  aspirin EC 81 MG tablet Take 1 tablet (81 mg total) by mouth daily. Swallow whole. 01/19/22  Yes Georgeanna Lea, MD  cetirizine (ZYRTEC) 10 MG tablet TAKE 1 TABLET EVERY DAY 08/15/22  Yes Georgina Quint, MD  diltiazem (DILACOR XR) 240 MG 24 hr capsule TAKE 1 CAPSULE EVERY DAY 03/05/23  Yes Aarohi Redditt, Eilleen Kempf, MD  fluticasone [facility]) 50 MCG/ACT nasal spray Place 1 spray into both nostrils daily. 05/11/21  Yes Nyoka Cowden, MD  mirabegron ER (MYRBETRIQ) 25 MG TB24 tablet Take 1 tablet (25 mg total) by mouth daily. 03/25/23  Yes McKenzie, Mardene Celeste, MD  olmesartan (BENICAR) 40 MG tablet TAKE 1 TABLET EVERY DAY 11/14/22  Yes Leone Mobley, Eilleen Kempf, MD  omeprazole (PRILOSEC OTC) 20 MG tablet Take 20 mg by mouth daily as needed (acid reflux).   Yes [provider]  rosuvastatin (CRESTOR) 20 MG tablet Take 1 tablet (20 mg total) by mouth daily. Patient needs an appointment for further refills. 2 nd attempt 03/26/23  Yes Georgeanna Lea,  MD  sildenafil (VIAGRA) 100 MG tablet Take 1 tablet (100 mg total) by mouth daily as needed for erectile dysfunction. 05/15/23  Yes McKenzie, Mardene Celeste, MD  silodosin (RAPAFLO) 8 MG CAPS capsule Take 1 capsule (8 mg total) by mouth at bedtime. 11/16/22  Yes McKenzie, Mardene Celeste, MD  solifenacin (VESICARE) 5 MG tablet Take 1 tablet (5 mg total) by mouth daily. 05/15/23  Yes McKenzie, Mardene Celeste, MD  SYMBICORT 160-4.5 MCG/ACT inhaler INHALE 2 PUFFS FIRST THING IN THE MORNING AND THEN 2 PUFFS ABOUT 12 HOURS LATER AS DIRECTED 04/11/22  Yes Nyoka Cowden, MD  Tiotropium Bromide Monohydrate (SPIRIVA RESPIMAT) 2.5 MCG/ACT AERS Inhale 2 puffs into the lungs daily. 09/10/22  Yes Nyoka Cowden, MD  triamcinolone cream (KENALOG) 0.1 % APPLY TO THE AFFECTED AREA(S) TOPICALLY TWICE DAILY 10/16/22  Yes Safaa Stingley, Eilleen Kempf, MD  tadalafil (CIALIS) 20 MG tablet Take 1 tablet (20 mg total) by mouth daily as needed for erectile dysfunction. Patient not taking: Reported on 06/03/2023 09/13/22   Malen Gauze, MD  tamsulosin (FLOMAX) 0.4 MG CAPS capsule Take 1 capsule (0.4 mg total) by mouth in the morning and at bedtime. Patient not taking: Reported on 06/03/2023 05/15/23   Malen Gauze, MD  traMADol (ULTRAM) 50 MG tablet Take 1 tablet (50 mg total) by mouth every 6 (six) hours as needed. 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   Cervical radiculitis 08/23/2021   Preop cardiovascular exam 06/23/2021   Atherosclerosis of aorta (HCC) 04/20/2021   Acquired thrombophilia (HCC) 05/05/2020   Prostate cancer (HCC) 01/14/2018   Carotid artery stenosis, asymptomatic, bilateral 12/06/2017   Vitamin D deficiency 12/03/2017   Prediabetes 11/14/2017    Chronic kidney disease (CKD) stage G3a/A2, moderately decreased glomerular filtration rate (GFR) between 45-59 mL/min/1.73 square meter and albuminuria creatinine ratio between 30-299 mg/g (HCC) 11/13/2017   Cigarette smoker 08/20/2017   COPD GOLD 2 still smoking  07/31/2017   Coronary atherosclerosis of native coronary artery 07/31/2017   Benign prostatic hyperplasia with urinary obstruction 05/30/2016   Allergic rhinitis 06/01/2008   DENTAL CARIES 06/01/2008   GERD 03/04/2008   Hyperlipidemia LDL goal <70 01/30/2008   Essential hypertension 01/30/2008   PERIPHERAL VASCULAR DISEASE 01/30/2008   COCAINE ABUSE 01/30/2008    Past Medical History:  Diagnosis Date   Allergic rhinitis    Anal lesion    polypoid   Atherosclerosis of aorta (HCC)    Carotid artery stenosis, asymptomatic, bilateral 11/2017   last carotid ultrasound in epic 07-19-2021  right ECA >50% and left ICA 40-59% stenosis   CKD (chronic kidney disease), stage III (HCC)    followed by pcp;  previously followed by nephrologist--- dr Eulogio Ditch  (per pt lov appprox 2020)   DOE (dyspnea on exertion)    10-27-2021  pt stated has normal doe due to COPD, but past couple days it has even more so, Dr Sherene Sires started pt on prednisone today   ED (erectile dysfunction)    GERD (gastroesophageal reflux disease)    History of adenomatous polyp of colon    Hyperplasia of prostate with lower urinary tract symptoms (LUTS)    Hypertension    followed by pcp;   nuclear stress test 07-13-2021 low risk no ischemia w/ nuclear ef 62%;  echo 07-13-2021 ef 55-60%, aortic valve w/ mild AR with mild to moderate sclerosis, RVSP 36.4mmHg   Peripheral vascular disease (HCC)    Pre-diabetes    Prostate cancer [facility]) 2018   urologist-- dr Ronne Binning;  first dx 02/ 2018 Gleason 3+3, PSA 6.4 active survillance;  MRI fusion bx 04-21-2021, Gleason 3+4, PSA 20.5   Renal cyst, acquired    bilateral   Stage 2 moderate COPD by GOLD classification [facility])     pulmonologist--- dr Sherene Sires;  emphysema still smoking ;  pt does not have oxygen (10-27-2021  pt stated trying to quit last cigarette in 7 days, had cut done to 3-4 cig per week)   Wears dentures    full upper and partial lower   Wears glasses     Past Surgical History:  Procedure Laterality Date   COLONOSCOPY WITH PROPOFOL  03/28/2021   by dr Christella Hartigan   GOLD SEED IMPLANT N/A 11/30/2021   Procedure: GOLD SEED IMPLANT;  Surgeon: Malen Gauze, MD;  Location: [facility];  Service: Urology;  Laterality: N/A;   NO PAST SURGERIES     SPACE OAR INSTILLATION N/A 11/30/2021   Procedure: SPACE OAR INSTILLATION;  Surgeon: Malen Gauze, MD;  Location: [facility];  Service: Urology;  Laterality: N/A;   TRANSANAL EXCISION OF RECTAL MASS N/A 10/30/2021   Procedure: TRANSANAL EXCISION OF ANAL  CANAL POLYPOID LESION;  Surgeon: Andria Meuse, MD;  Location: [facility] ;  Service: General;  Laterality: N/A;    Social History   Socioeconomic History   Marital status: Married    Spouse name: Not on file   Number of children: 1   Years of education: Not on file   Highest education level: Some college, no degree  Occupational History   Not on file  Tobacco Use   Smoking status: Some Days    Types: Cigarettes   Smokeless tobacco: Never   Tobacco comments:    10-27-2021  pt stated trying to quit, last cigarette 7 days ago,  had gotten down to 3-4 cig per week (pt started smoking age 70)  Vaping Use   Vaping status: Never Used  Substance and Sexual Activity   Alcohol use: Not Currently    Comment: occasional   Drug use: Not Currently    Types: Marijuana, Cocaine    Comment: 10-27-2021  pt stated last cocine age 54s and last marijuana age early 75s   Sexual activity: Yes  Other Topics Concern   Not on file  Social History Narrative   Not on file   Social Determinants of Health   Financial Resource Strain: Low Risk  (04/03/2023)   Overall Financial Resource Strain (CARDIA)     Difficulty of Paying Living Expenses: Not hard at all  Food Insecurity: No Food Insecurity (04/03/2023)   Hunger Vital Sign    Worried About Running Out of Food in the Last Year: Never true    Ran Out of Food in the Last Year: Never true  Transportation Needs: No Transportation Needs (04/03/2023)   PRAPARE - Administrator, Civil Service (Medical): No    Lack of Transportation (Non-Medical): No  Physical Activity: Insufficiently Active (04/03/2023)   Exercise Vital Sign    Days of Exercise per Week: 2 days    Minutes of Exercise per Session: 20 min  Stress: No Stress Concern Present (04/03/2023)   [employer] of Occupational Health - Occupational Stress Questionnaire    Feeling of Stress : Not at all  Social Connections: Moderately Integrated (04/03/2023)   Social Connection and Isolation Panel [NHANES]    Frequency of Communication with Friends and Family: Twice a week    Frequency of Social Gatherings with Friends and Family: Once a week    Attends Religious Services: Never    Database administrator or Organizations: Yes    Attends Banker Meetings: 1 to 4 times per year    Marital Status: Married  Catering manager Violence: Not At Risk (04/03/2023)   Humiliation, Afraid, Rape, and Kick questionnaire    Fear of Current or Ex-Partner: No    Emotionally Abused: No    Physically Abused: No    Sexually Abused: No    Family History  Problem Relation Age of Onset   Colon cancer Neg Hx    Esophageal cancer Neg Hx    Rectal cancer Neg Hx    Stomach cancer Neg Hx    Colon polyps Neg Hx      Review of Systems  Constitutional: Negative.  Negative for chills and fever.  HENT: Negative.  Negative for congestion.   Respiratory: Negative.  Negative for cough and shortness of breath.   Cardiovascular: Negative.  Negative for chest pain and palpitations.  Gastrointestinal:  Negative for abdominal pain, diarrhea, nausea and vomiting.  Genitourinary:  Negative.   Musculoskeletal:  Positive for  joint pain (Left shoulder pain).  Skin: Negative.  Negative for rash.  Neurological: Negative.  Negative for dizziness and headaches.  All other systems reviewed and are negative.   Today's Vitals   06/03/23 1529  BP: 138/78  Pulse: 76  Temp: 98.6 F (37 C)  TempSrc: Oral  SpO2: 96%  Weight: 191 lb 8 oz (86.9 kg)  Height: 6\' 1"  (1.854 m)   Body mass index is 25.27 kg/m.   Physical Exam Vitals reviewed.  Constitutional:      Appearance: Normal appearance.  HENT:     Head: Normocephalic.  Eyes:     Extraocular Movements: Extraocular movements intact.  Cardiovascular:     Rate and Rhythm: Normal rate and regular rhythm.     Pulses: Normal pulses.     Heart sounds: Normal heart sounds.  Pulmonary:     Effort: Pulmonary effort is normal.     Breath sounds: Normal breath sounds.  Musculoskeletal:     Comments: Left shoulder: Full range of motion.  No significant tenderness.  No swelling.  No crepitation.  No abnormal findings. Left upper extremity: Neurovascularly intact.  Within normal limits.  Skin:    General: Skin is warm and dry.  Neurological:     General: No focal deficit present.     Mental Status: He is alert and oriented to person, place, and time.  Psychiatric:        Mood and Affect: Mood normal.        Behavior: Behavior normal.    DG Shoulder Left  Result Date: 06/03/2023 CLINICAL DATA:  Left shoulder pain. EXAM: LEFT SHOULDER - 2+ VIEW COMPARISON:  None Available. FINDINGS: No acute fracture or dislocation is identified. Mild joint space narrowing and spurring are noted at the acromioclavicular and glenohumeral joints. The soft tissues are unremarkable. IMPRESSION: Mild acromioclavicular and glenohumeral osteoarthrosis. No acute osseous abnormality. Electronically Signed   By: Sebastian Ache M.D.   On: 06/03/2023 16:23     ASSESSMENT & PLAN: A total of 32 minutes was spent with the patient and  counseling/coordination of care regarding preparing for this visit, review of most recent office visit notes, review of chronic medical conditions under management, review of all medications, review of x-ray report done today, pain management, differential diagnosis of chronic left shoulder pain and need for orthopedic evaluation, prognosis, documentation and need for follow-up.  Problem List Items Addressed This Visit       Other   Acute pain of left shoulder - Primary    Acute exacerbation of chronic problem. No recent injury. Suspect rotator cuff pathology X-ray done today.  Report reviewed. Pain management discussed.  Tylenol helping with pain. Recommend sports medicine evaluation Referral placed today.      Relevant Orders   Ambulatory referral to Sports Medicine   DG Shoulder Left   Patient Instructions  Shoulder Pain Many things can cause shoulder pain, including: An injury. Moving the shoulder in the same way again and again (overuse). Joint pain (arthritis). Pain can come from: Swelling and irritation (inflammation) of any part of the shoulder. An injury to: The shoulder joint. Tissues that connect muscle to bone (tendons). Tissues that connect bones to each other (ligaments). Bones. Follow these instructions at home: Watch for changes in your symptoms. Let your doctor know about them. Follow these instructions to help with your pain. If you have a sling that can be taken off: Wear the sling as told by your doctor. Take it off only as  told by your doctor. Check the skin around the sling every day. Tell your doctor if you see problems. Loosen the sling if your fingers: Tingle. Become numb. Become cold. Keep the sling clean. If the sling is not waterproof: Do not let it get wet. Take the sling off when you shower or bathe. Managing pain, stiffness, and swelling  If told, put ice on the painful area. Put ice in a plastic bag. Place a towel between your skin and  the bag. Leave the ice on for 20 minutes, 2-3 times a day. Stop putting ice on if it does not help with the pain. If your skin turns bright red, take off the ice right away to prevent skin damage. The risk of damage is higher if you cannot feel pain, heat, or cold. Squeeze a soft ball or a foam pad as much as possible. This prevents swelling in the shoulder. It also helps to strengthen the arm. General instructions Take over-the-counter and prescription medicines only as told by your doctor. Keep all follow-up visits. This will help you avoid any type of permanent shoulder problems. Contact a doctor if: Your pain gets worse. Medicine does not help your pain. You have new pain in your arm, hand, or fingers. You loosen your sling and your arm, hand, or fingers: Tingle. Are numb. Are swollen. Get help right away if: Your arm, hand, or fingers turn white or blue. This information is not intended to replace advice given to you by your health care provider. Make sure you discuss any questions you have with your health care provider. Document Revised: 06/01/2022 Document Reviewed: 06/01/2022 Elsevier Patient Education  2024 Elsevier Inc.     Edwina Barth, MD [facility]

## 2023-06-03 NOTE — Assessment & Plan Note (Signed)
Acute exacerbation of chronic problem. No recent injury. Suspect rotator cuff pathology X-ray done today.  Report reviewed. Pain management discussed.  Tylenol helping with pain. Recommend sports medicine evaluation Referral placed today.

## 2023-06-03 NOTE — Patient Instructions (Signed)
Shoulder Pain Many things can cause shoulder pain, including: An injury. Moving the shoulder in the same way again and again (overuse). Joint pain (arthritis). Pain can come from: Swelling and irritation (inflammation) of any part of the shoulder. An injury to: The shoulder joint. Tissues that connect muscle to bone (tendons). Tissues that connect bones to each other (ligaments). Bones. Follow these instructions at home: Watch for changes in your symptoms. Let your doctor know about them. Follow these instructions to help with your pain. If you have a sling that can be taken off: Wear the sling as told by your doctor. Take it off only as told by your doctor. Check the skin around the sling every day. Tell your doctor if you see problems. Loosen the sling if your fingers: Tingle. Become numb. Become cold. Keep the sling clean. If the sling is not waterproof: Do not let it get wet. Take the sling off when you shower or bathe. Managing pain, stiffness, and swelling  If told, put ice on the painful area. Put ice in a plastic bag. Place a towel between your skin and the bag. Leave the ice on for 20 minutes, 2-3 times a day. Stop putting ice on if it does not help with the pain. If your skin turns bright red, take off the ice right away to prevent skin damage. The risk of damage is higher if you cannot feel pain, heat, or cold. Squeeze a soft ball or a foam pad as much as possible. This prevents swelling in the shoulder. It also helps to strengthen the arm. General instructions Take over-the-counter and prescription medicines only as told by your doctor. Keep all follow-up visits. This will help you avoid any type of permanent shoulder problems. Contact a doctor if: Your pain gets worse. Medicine does not help your pain. You have new pain in your arm, hand, or fingers. You loosen your sling and your arm, hand, or fingers: Tingle. Are numb. Are swollen. Get help right away  if: Your arm, hand, or fingers turn white or blue. This information is not intended to replace advice given to you by your health care provider. Make sure you discuss any questions you have with your health care provider. Document Revised: 06/01/2022 Document Reviewed: 06/01/2022 Elsevier Patient Education  2024 Elsevier Inc.  

## 2023-06-04 ENCOUNTER — Encounter: Payer: Self-pay | Admitting: Family Medicine

## 2023-06-04 ENCOUNTER — Ambulatory Visit: Payer: Medicare HMO | Admitting: Family Medicine

## 2023-06-04 ENCOUNTER — Other Ambulatory Visit: Payer: Self-pay

## 2023-06-04 VITALS — BP 164/64 | HR 106 | Ht 73.0 in | Wt 198.2 lb

## 2023-06-04 DIAGNOSIS — G8929 Other chronic pain: Secondary | ICD-10-CM

## 2023-06-04 DIAGNOSIS — M25512 Pain in left shoulder: Secondary | ICD-10-CM

## 2023-06-04 NOTE — Patient Instructions (Signed)
Thank you for coming in today.   I've referred you to Physical Therapy.  Let us know if you don't hear from them in one week.   Recheck in 8 weeks.   Let me know if this not working.   We can do more.

## 2023-06-04 NOTE — Progress Notes (Signed)
I, Andrew [name], CMA acting as a scribe for Andrew Graham, MD.  Andrew [name] is a 72 y.o. male who presents to [facility] at [facility] today for L shoulder pain that has worsened over this last week. No direct MOI. Pt locates pain to top of the shoulder joint. Sx improve when the arm is closer to the body, worse when first getting up. Pt is RHD. ROM well maintained overall, some pain at 90 degrees with lateral raises.   Neck pain: left-sided Radiates: no Aggravates: lateral raises, first thing in the morning Treatments tried: Tylenol  Dx imaging: 06/03/23 L shoulder XR  Pertinent review of systems: No fevers or chills  Relevant historical information: Hypertension   Exam:  BP (!) 164/64   Pulse (!) 106   Ht 6\' 1"  (1.854 m)   Wt 198 lb 3.2 oz (89.9 kg)   SpO2 95%   BMI 26.15 kg/m  General: Well Developed, well nourished, and in no acute distress.   MSK: Left shoulder: Normal-appearing. Normal motion some crepitation present with abduction. Strength intact abduction external and internal rotation.  Some pain reproduced with resisted abduction. Positive Hawkins and Neer's test.  Positive crossover arm compression test. Negative Yergason's and speeds test. Pulses capillary refill and sensation are intact distally.    Lab and Radiology Results  Diagnostic Limited MSK Ultrasound of: Left shoulder Biceps tendon intact.  Hypoechoic fluid tracking around the biceps tendon in the bicipital groove consistent with internal shoulder irritation or biceps tenosynovitis. Subscapularis tendon is intact without obvious retracted tear. Supraspinatus tendon appears to be intact without retracted rotator cuff tear.  Mild subacromial bursitis is present. Infraspinatus tendon intact without retracted tear. AC joint mild effusion Impression: Subacromial bursitis   No results found for this or any previous visit (from the past 72 hour(s)). DG Shoulder Left  Result  Date: 06/03/2023 CLINICAL DATA:  Left shoulder pain. EXAM: LEFT SHOULDER - 2+ VIEW COMPARISON:  None Available. FINDINGS: No acute fracture or dislocation is identified. Mild joint space narrowing and spurring are noted at the acromioclavicular and glenohumeral joints. The soft tissues are unremarkable. IMPRESSION: Mild acromioclavicular and glenohumeral osteoarthrosis. No acute osseous abnormality. Electronically Signed   By: Sebastian Ache M.D.   On: 06/03/2023 16:23       Assessment and Plan: 72 y.o. male with chronic left shoulder pain thought primarily due to subacromial bursitis and perhaps some AC DJD or irritation.  He is a great candidate for trial of physical therapy.  PT referral placed today.  Recheck in 8 weeks.  Consider steroid injection if not improving or if worsening.   PDMP not reviewed this encounter. Orders Placed This Encounter  Procedures   [place] LIMITED JOINT SPACE STRUCTURES UP LEFT(NO LINKED CHARGES)    Order Specific Question:   Reason for Exam (SYMPTOM  OR DIAGNOSIS REQUIRED)    Answer:   left shoulder pain    Order Specific Question:   Preferred imaging location?    Answer:   [facility] referral to Physical Therapy    Referral Priority:   Routine    Referral Type:   Physical Medicine    Referral Reason:   Specialty Services Required    Requested Specialty:   Physical Therapy    Number of Visits Requested:   1   No orders of the defined types were placed in this encounter.    Discussed warning signs or symptoms. Please see discharge instructions. Patient  expresses understanding.   The above documentation has been reviewed and is accurate and complete Andrew [name], M.D.

## 2023-06-05 NOTE — Therapy (Deleted)
OUTPATIENT PHYSICAL THERAPY SHOULDER EVALUATION   Patient Name: Andrew [name] MRN: [id number] DOB:February 02, 1951, 72 y.o., male Today's Date: 06/05/2023  END OF SESSION:   Past Medical History:  Diagnosis Date   Allergic rhinitis    Anal lesion    polypoid   Atherosclerosis of aorta (HCC)    Carotid artery stenosis, asymptomatic, bilateral 11/2017   last carotid ultrasound in epic 07-19-2021  right ECA >50% and left ICA 40-59% stenosis   CKD (chronic kidney disease), stage III (HCC)    followed by pcp;  previously followed by nephrologist--- dr Eulogio Ditch  (per pt lov appprox 2020)   DOE (dyspnea on exertion)    10-27-2021  pt stated has normal doe due to COPD, but past couple days it has even more so, Dr Sherene Sires started pt on prednisone today   ED (erectile dysfunction)    GERD (gastroesophageal reflux disease)    History of adenomatous polyp of colon    Hyperplasia of prostate with lower urinary tract symptoms (LUTS)    Hypertension    followed by pcp;   nuclear stress test 07-13-2021 low risk no ischemia w/ nuclear ef 62%;  echo 07-13-2021 ef 55-60%, aortic valve w/ mild AR with mild to moderate sclerosis, RVSP 36.27mmHg   Peripheral vascular disease (HCC)    Pre-diabetes    Prostate cancer [facility]) 2018   urologist-- dr Ronne Binning;  first dx 02/ 2018 Gleason 3+3, PSA 6.4 active survillance;  MRI fusion bx 04-21-2021, Gleason 3+4, PSA 20.5   Renal cyst, acquired    bilateral   Stage 2 moderate COPD by GOLD classification [facility])    pulmonologist--- dr Sherene Sires;  emphysema still smoking ;  pt does not have oxygen (10-27-2021  pt stated trying to quit last cigarette in 7 days, had cut done to 3-4 cig per week)   Wears dentures    full upper and partial lower   Wears glasses    Past Surgical History:  Procedure Laterality Date   COLONOSCOPY WITH PROPOFOL  03/28/2021   by dr Christella Hartigan   GOLD SEED IMPLANT N/A 11/30/2021   Procedure: GOLD SEED IMPLANT;  Surgeon: Malen Gauze, MD;   Location: [facility];  Service: Urology;  Laterality: N/A;   NO PAST SURGERIES     SPACE OAR INSTILLATION N/A 11/30/2021   Procedure: SPACE OAR INSTILLATION;  Surgeon: Malen Gauze, MD;  Location: [facility];  Service: Urology;  Laterality: N/A;   TRANSANAL EXCISION OF RECTAL MASS N/A 10/30/2021   Procedure: TRANSANAL EXCISION OF ANAL CANAL POLYPOID LESION;  Surgeon: Andria Meuse, MD;  Location: [facility];  Service: General;  Laterality: N/A;   Patient Active Problem List   Diagnosis Date Noted   Acute pain of left shoulder 06/03/2023   Acute gouty arthritis 03/04/2023   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   Cervical radiculitis 08/23/2021   Preop cardiovascular exam 06/23/2021   Atherosclerosis of aorta (HCC) 04/20/2021   Acquired thrombophilia (HCC) 05/05/2020   Prostate cancer (HCC) 01/14/2018   Carotid artery stenosis, asymptomatic, bilateral 12/06/2017   Vitamin D deficiency 12/03/2017   Prediabetes 11/14/2017   Chronic kidney disease (CKD) stage G3a/A2, moderately decreased glomerular filtration rate (GFR) between 45-59 mL/min/1.73 square meter and albuminuria creatinine ratio between 30-299 mg/g (HCC) 11/13/2017   Cigarette smoker 08/20/2017   COPD GOLD 2 still smoking  07/31/2017   Coronary atherosclerosis of native coronary artery 07/31/2017   Benign prostatic hyperplasia with urinary obstruction 05/30/2016   Allergic rhinitis 06/01/2008  DENTAL CARIES 06/01/2008   GERD 03/04/2008   Hyperlipidemia LDL goal <70 01/30/2008   Essential hypertension 01/30/2008   PERIPHERAL VASCULAR DISEASE 01/30/2008   COCAINE ABUSE 01/30/2008    PCP: Georgina Quint, MD  REFERRING PROVIDER: Rodolph Bong, MD  REFERRING DIAG: [phone] (ICD-10-CM) - Chronic left shoulder pain  THERAPY DIAG:  No diagnosis found.  Rationale for Evaluation and Treatment: Rehabilitation  ONSET DATE: ***  SUBJECTIVE:                                                                                                                                                                                       SUBJECTIVE STATEMENT: ***  L shoulder pain that has worsened over this last week. No direct MOI. Pt locates pain to top of the shoulder joint. Sx improve when the arm is closer to the body, worse when first getting up. Pt is RHD. ROM well maintained overall, some pain at 90 degrees with lateral raises.   Hand dominance: {MISC; OT HAND DOMINANCE:[id number]}  PERTINENT HISTORY: GERD, CKD, HTN, Pre diabetes, COPD, CAD  PAIN:  Are you having pain? Yes: NPRS scale: ***/10 Pain location: *** Pain description: *** Aggravating factors: *** Relieving factors: ***  PRECAUTIONS: {Therapy precautions:24002}  RED FLAGS: {PT Red Flags:29287}   WEIGHT BEARING RESTRICTIONS: {Yes ***/No:24003}  FALLS:  Has patient fallen in last 6 months? {fallsyesno:27318}  LIVING ENVIRONMENT: Lives with: {[facility] lives with:25569::"lives with their family"} Lives in: {Lives in:25570} Stairs: {opstairs:27293} Has following equipment at home: {Assistive devices:23999}  OCCUPATION: ***  PLOF: {PLOF:24004}  PATIENT GOALS:***  NEXT MD VISIT:   OBJECTIVE:   DIAGNOSTIC FINDINGS:  Xray Left shoulder 06/03/23 FINDINGS: No acute fracture or dislocation is identified. Mild joint space narrowing and spurring are noted at the acromioclavicular and glenohumeral joints. The soft tissues are unremarkable.   IMPRESSION: Mild acromioclavicular and glenohumeral osteoarthrosis. No acute osseous abnormality.  PATIENT SURVEYS:  {rehab surveys:24030:a}  COGNITION: Overall cognitive status: {cognition:24006}     SENSATION: {sensation:27233}  POSTURE: ***    UE Measurements Upper Extremity Right EVAL Left EVAL   A/PROM MMT A/PROM MMT  Shoulder Flexion      Shoulder Extension      Shoulder Abduction      Shoulder Adduction      Shoulder Internal Rotation       Shoulder External Rotation      Elbow Flexion      Elbow Extension      Wrist Flexion      Wrist Extension      Wrist Supination      Wrist Pronation      Wrist Ulnar Deviation  Wrist Radial Deviation      Grip Strength NA  NA     (Blank rows = not tested)   * pain   SHOULDER SPECIAL TESTS: Impingement tests: {shoulder impingement test:25231:a} SLAP lesions: {SLAP lesions:25232} Instability tests: {shoulder instability test:25233} Rotator cuff assessment: {rotator cuff assessment:25234} Biceps assessment: {biceps assessment:25235}  JOINT MOBILITY TESTING:  ***  PALPATION:  ***   TODAY'S TREATMENT:                                                                                                                                         DATE: *** 06/05/2023  Therapeutic Exercise:  Aerobic: Supine: Prone:  Seated:  Standing: Neuromuscular Re-education: Manual Therapy: Therapeutic Activity: Self Care: Trigger Point Dry Needling:  Modalities:    PATIENT EDUCATION:  Education details: on current presentation, on HEP, on clinical outcomes score and POC Person educated: Patient Education method: Explanation, Demonstration, and Handouts Education comprehension: verbalized understanding   HOME EXERCISE PROGRAM: ***  ASSESSMENT:  CLINICAL IMPRESSION: Patient is a *** y.o. *** who was seen today for physical therapy evaluation and treatment for ***.   OBJECTIVE IMPAIRMENTS: {opptimpairments:25111}.   ACTIVITY LIMITATIONS: {activitylimitations:27494}  PARTICIPATION LIMITATIONS: {participationrestrictions:25113}  PERSONAL FACTORS: {Personal factors:25162} are also affecting patient's functional outcome.   REHAB POTENTIAL: {rehabpotential:25112}  CLINICAL DECISION MAKING: {clinical decision making:25114}  EVALUATION COMPLEXITY: {Evaluation complexity:25115}   GOALS: Goals reviewed with patient? yes  SHORT TERM GOALS: Target date: {follow up:25551} ***  MAKE TEXT EDITABLE Patient will be independent in self management strategies to improve quality of life and functional outcomes. Baseline: New Program Goal status: INITIAL  2.  Patient will report at least 50% improvement in overall symptoms and/or function to demonstrate improved functional mobility Baseline: 0% better Goal status: INITIAL  3.  *** Baseline:  Goal status: INITIAL  4.  *** Baseline:  Goal status: INITIAL    LONG TERM GOALS: Target date: {follow up:25551} *** MAKE TEXT EDITABLE  Patient will report at least 75% improvement in overall symptoms and/or function to demonstrate improved functional mobility Baseline: 0% better Goal status: INITIAL  2.  Patient will improve score on FOTO outcomes measure to projected score to demonstrate overall improved function and QOL Baseline: see above Goal status: INITIAL  3.  *** Baseline:  Goal status: INITIAL  4.  *** Baseline:  Goal status: INITIAL   PLAN:  PT FREQUENCY: {rehab frequency:25116}  PT DURATION: {rehab duration:25117}  PLANNED INTERVENTIONS: Therapeutic exercises, Therapeutic activity, Neuromuscular re-education, Balance training, Gait training, Patient/Family education, Self Care, Joint mobilization, Joint manipulation, Stair training, Vestibular training, Canalith repositioning, Orthotic/Fit training, Prosthetic training, DME instructions, Aquatic Therapy, Dry Needling, Electrical stimulation, Spinal manipulation, Spinal mobilization, Cryotherapy, Moist heat, Taping, Traction, Ultrasound, Ionotophoresis 4mg /ml Dexamethasone, Manual therapy, and Re-evaluation.   PLAN FOR NEXT SESSION: ***   10:43 AM, 06/05/23 Tereasa Coop, DPT Physical Therapy with [facility]

## 2023-06-10 ENCOUNTER — Ambulatory Visit: Payer: Medicare HMO | Admitting: Physical Therapy

## 2023-07-30 ENCOUNTER — Ambulatory Visit: Payer: Medicare HMO | Admitting: Family Medicine

## 2023-08-07 ENCOUNTER — Emergency Department (HOSPITAL_COMMUNITY): Payer: Medicare HMO

## 2023-08-07 ENCOUNTER — Emergency Department (HOSPITAL_COMMUNITY)
Admission: EM | Admit: 2023-08-07 | Discharge: 2023-08-07 | Disposition: A | Discharge status: Home / Self Care | Payer: Medicare HMO | Attending: Emergency Medicine | Admitting: Emergency Medicine

## 2023-08-07 ENCOUNTER — Encounter (HOSPITAL_COMMUNITY): Payer: Self-pay | Admitting: Emergency Medicine

## 2023-08-07 DIAGNOSIS — G319 Degenerative disease of nervous system, unspecified: Secondary | ICD-10-CM | POA: Diagnosis not present

## 2023-08-07 DIAGNOSIS — R55 Syncope and collapse: Secondary | ICD-10-CM | POA: Insufficient documentation

## 2023-08-07 DIAGNOSIS — Z7982 Long term (current) use of aspirin: Secondary | ICD-10-CM | POA: Insufficient documentation

## 2023-08-07 DIAGNOSIS — J439 Emphysema, unspecified: Secondary | ICD-10-CM | POA: Diagnosis not present

## 2023-08-07 DIAGNOSIS — R7989 Other specified abnormal findings of blood chemistry: Secondary | ICD-10-CM | POA: Diagnosis not present

## 2023-08-07 DIAGNOSIS — S0101XA Laceration without foreign body of scalp, initial encounter: Secondary | ICD-10-CM | POA: Diagnosis not present

## 2023-08-07 DIAGNOSIS — Z79899 Other long term (current) drug therapy: Secondary | ICD-10-CM | POA: Diagnosis not present

## 2023-08-07 DIAGNOSIS — I129 Hypertensive chronic kidney disease with stage 1 through stage 4 chronic kidney disease, or unspecified chronic kidney disease: Secondary | ICD-10-CM | POA: Insufficient documentation

## 2023-08-07 DIAGNOSIS — W19XXXA Unspecified fall, initial encounter: Secondary | ICD-10-CM

## 2023-08-07 DIAGNOSIS — S199XXA Unspecified injury of neck, initial encounter: Secondary | ICD-10-CM | POA: Diagnosis not present

## 2023-08-07 DIAGNOSIS — Z23 Encounter for immunization: Secondary | ICD-10-CM | POA: Insufficient documentation

## 2023-08-07 DIAGNOSIS — Z8546 Personal history of malignant neoplasm of prostate: Secondary | ICD-10-CM | POA: Insufficient documentation

## 2023-08-07 DIAGNOSIS — N183 Chronic kidney disease, stage 3 unspecified: Secondary | ICD-10-CM | POA: Diagnosis not present

## 2023-08-07 DIAGNOSIS — S0990XA Unspecified injury of head, initial encounter: Secondary | ICD-10-CM | POA: Diagnosis not present

## 2023-08-07 DIAGNOSIS — W01198A Fall on same level from slipping, tripping and stumbling with subsequent striking against other object, initial encounter: Secondary | ICD-10-CM | POA: Insufficient documentation

## 2023-08-07 DIAGNOSIS — J449 Chronic obstructive pulmonary disease, unspecified: Secondary | ICD-10-CM | POA: Diagnosis not present

## 2023-08-07 LAB — BASIC METABOLIC PANEL
Anion gap: 11 (ref 5–15)
BUN: 23 mg/dL (ref 8–23)
CO2: 21 mmol/L — ABNORMAL LOW (ref 22–32)
Calcium: 9.2 mg/dL (ref 8.9–10.3)
Chloride: 107 mmol/L (ref 98–111)
Creatinine, Ser: 2.4 mg/dL — ABNORMAL HIGH (ref 0.61–1.24)
GFR, Estimated: 28 mL/min — ABNORMAL LOW (ref >60)
Glucose, Bld: 109 mg/dL — ABNORMAL HIGH (ref 70–99)
Potassium: 4.1 mmol/L (ref 3.5–5.1)
Sodium: 139 mmol/L (ref 135–145)

## 2023-08-07 LAB — CBC
HCT: 43.1 % (ref 39.0–52.0)
Hemoglobin: 14.3 g/dL (ref 13.0–17.0)
MCH: 28.9 pg (ref 26.0–34.0)
MCHC: 33.2 g/dL (ref 30.0–36.0)
MCV: 87.1 fL (ref 80.0–100.0)
Platelets: 244 10*3/uL (ref 150–400)
RBC: 4.95 MIL/uL (ref 4.22–5.81)
RDW: 13.6 % (ref 11.5–15.5)
WBC: 7.7 10*3/uL (ref 4.0–10.5)
nRBC: 0 % (ref 0.0–0.2)

## 2023-08-07 LAB — TROPONIN I (HIGH SENSITIVITY)
Troponin I (High Sensitivity): 8 ng/L (ref <18)
Troponin I (High Sensitivity): 9 ng/L (ref <18)

## 2023-08-07 MED ORDER — TETANUS-DIPHTH-ACELL PERTUSSIS 5-2.5-18.5 LF-MCG/0.5 IM SUSY
0.5000 mL | PREFILLED_SYRINGE | Freq: Once | INTRAMUSCULAR | Status: AC
  Administered 2023-08-07: 0.5 mL via INTRAMUSCULAR
  Filled 2023-08-07: qty 0.5

## 2023-08-07 MED ORDER — LIDOCAINE-EPINEPHRINE (PF) 2 %-1:200000 IJ SOLN
10.0000 mL | Freq: Once | INTRAMUSCULAR | Status: AC
  Administered 2023-08-07: 10 mL
  Filled 2023-08-07: qty 20

## 2023-08-07 MED ORDER — SODIUM CHLORIDE 0.9 % IV BOLUS
500.0000 mL | Freq: Once | INTRAVENOUS | Status: AC
  Administered 2023-08-07: 500 mL via INTRAVENOUS

## 2023-08-07 NOTE — ED Provider Notes (Signed)
Procedure Note   .Marland KitchenLaceration Repair  Date/Time: 08/07/2023 6:57 PM  Performed by: Manuela Neptune, MD Authorized by: Benjiman Core, MD   Consent:    Consent obtained:  Verbal   Consent given by:  Patient   Risks discussed:  Infection, pain and poor wound healing   Alternatives discussed:  No treatment Universal protocol:    Procedure explained and questions answered to patient or proxy's satisfaction: yes     Patient identity confirmed:  Verbally with patient Anesthesia:    Anesthesia method:  Local infiltration   Local anesthetic:  Lidocaine 2% WITH epi Laceration details:    Location:  Scalp   Scalp location:  Occipital   Length (cm):  5 Exploration:    Hemostasis achieved with:  Epinephrine Treatment:    Area cleansed with:  Saline   Amount of cleaning:  Standard   Irrigation solution:  Sterile saline   Irrigation method:  Tap   Debridement:  None   Undermining:  None   Scar revision: no   Skin repair:    Repair method:  Staples   Number of staples:  7 Approximation:    Approximation:  Loose Repair type:    Repair type:  Simple Post-procedure details:    Dressing:  Antibiotic ointment, non-adherent dressing and adhesive bandage   Procedure completion:  Tolerated well, no immediate complications Comments:     Approximately 17cc of localized anesthesia was used to numb the area.      Hassan Rowan Washington, MD 08/07/23 Pauline Aus, MD 08/08/23 [phone]

## 2023-08-07 NOTE — ED Provider Notes (Signed)
[place] EMERGENCY DEPARTMENT AT [facility] Provider Note   CSN: [id number] Arrival date & time: 08/07/23  1521     History  Chief Complaint  Patient presents with   Andrew [name] is a 72 y.o. male.   Fall  Patient presents after fall and hitting his head.  Had a syncopal episode.  States he bent over to feed his cat got up and passed out.  Hit the back of his head on the ground.  States he has been feeling a little weak today.  No chest pain or trouble breathing.  No palpitations.  Does have laceration of the back of his head.  Not on blood thinners.  States he feels if he drinks enough water.  Has had lightheadedness previously for some of his prostate medicines.    Past Medical History:  Diagnosis Date   Allergic rhinitis    Anal lesion    polypoid   Atherosclerosis of aorta (HCC)    Carotid artery stenosis, asymptomatic, bilateral 11/2017   last carotid ultrasound in epic 07-19-2021  right ECA >50% and left ICA 40-59% stenosis   CKD (chronic kidney disease), stage III (HCC)    followed by pcp;  previously followed by nephrologist--- dr Eulogio Ditch  (per pt lov appprox 2020)   DOE (dyspnea on exertion)    10-27-2021  pt stated has normal doe due to COPD, but past couple days it has even more so, Dr Sherene Sires started pt on prednisone today   ED (erectile dysfunction)    GERD (gastroesophageal reflux disease)    History of adenomatous polyp of colon    Hyperplasia of prostate with lower urinary tract symptoms (LUTS)    Hypertension    followed by pcp;   nuclear stress test 07-13-2021 low risk no ischemia w/ nuclear ef 62%;  echo 07-13-2021 ef 55-60%, aortic valve w/ mild AR with mild to moderate sclerosis, RVSP 36.3mmHg   Peripheral vascular disease (HCC)    Pre-diabetes    Prostate cancer [facility]) 2018   urologist-- dr Ronne Binning;  first dx 02/ 2018 Gleason 3+3, PSA 6.4 active survillance;  MRI fusion bx 04-21-2021, Gleason 3+4, PSA 20.5   Renal cyst,  acquired    bilateral   Stage 2 moderate COPD by GOLD classification [facility])    pulmonologist--- dr Sherene Sires;  emphysema still smoking ;  pt does not have oxygen (10-27-2021  pt stated trying to quit last cigarette in 7 days, had cut done to 3-4 cig per week)   Wears dentures    full upper and partial lower   Wears glasses     Home Medications Prior to Admission medications   Medication Sig Start Date End Date Taking? Authorizing Provider  acetaminophen (TYLENOL) 500 MG tablet Take 1,000 mg by mouth every 6 (six) hours as needed for moderate pain.    [provider]  albuterol (PROVENTIL HFA;VENTOLIN HFA) 108 (90 Base) MCG/ACT inhaler Inhale 2 puffs into the lungs every 4 (four) hours as needed for wheezing or shortness of breath (cough, shortness of breath or wheezing.). 06/29/17   Sherren Mocha, MD  aspirin EC 81 MG tablet Take 1 tablet (81 mg total) by mouth daily. Swallow whole. 01/19/22   Georgeanna Lea, MD  cetirizine (ZYRTEC) 10 MG tablet TAKE 1 TABLET EVERY DAY 08/15/22   Georgina Quint, MD  diltiazem (DILACOR XR) 240 MG 24 hr capsule TAKE 1 CAPSULE EVERY DAY 03/05/23   Georgina Quint, MD  fluticasone (FLONASE) 50 MCG/ACT nasal spray Place 1 spray into both nostrils daily. 05/11/21   Nyoka Cowden, MD  mirabegron ER (MYRBETRIQ) 25 MG TB24 tablet Take 1 tablet (25 mg total) by mouth daily. 03/25/23   McKenzie, Mardene Celeste, MD  olmesartan (BENICAR) 40 MG tablet TAKE 1 TABLET EVERY DAY 11/14/22   Georgina Quint, MD  omeprazole (PRILOSEC OTC) 20 MG tablet Take 20 mg by mouth daily as needed (acid reflux).    [provider]  rosuvastatin (CRESTOR) 20 MG tablet Take 1 tablet (20 mg total) by mouth daily. Patient needs an appointment for further refills. 2 nd attempt 03/26/23   Georgeanna Lea, MD  sildenafil (VIAGRA) 100 MG tablet Take 1 tablet (100 mg total) by mouth daily as needed for erectile dysfunction. 05/15/23   McKenzie, Mardene Celeste, MD  silodosin  (RAPAFLO) 8 MG CAPS capsule Take 1 capsule (8 mg total) by mouth at bedtime. 11/16/22   McKenzie, Mardene Celeste, MD  solifenacin (VESICARE) 5 MG tablet Take 1 tablet (5 mg total) by mouth daily. 05/15/23   McKenzie, Mardene Celeste, MD  SYMBICORT 160-4.5 MCG/ACT inhaler INHALE 2 PUFFS FIRST THING IN THE MORNING AND THEN 2 PUFFS ABOUT 12 HOURS LATER AS DIRECTED 04/11/22   Nyoka Cowden, MD  tadalafil (CIALIS) 20 MG tablet Take 1 tablet (20 mg total) by mouth daily as needed for erectile dysfunction. 09/13/22   McKenzie, Mardene Celeste, MD  tamsulosin (FLOMAX) 0.4 MG CAPS capsule Take 1 capsule (0.4 mg total) by mouth in the morning and at bedtime. 05/15/23   McKenzie, Mardene Celeste, MD  Tiotropium Bromide Monohydrate (SPIRIVA RESPIMAT) 2.5 MCG/ACT AERS Inhale 2 puffs into the lungs daily. 09/10/22   Nyoka Cowden, MD  traMADol (ULTRAM) 50 MG tablet Take 1 tablet (50 mg total) by mouth every 6 (six) hours as needed. 11/30/21   McKenzie, Mardene Celeste, MD  triamcinolone cream (KENALOG) 0.1 % APPLY TO THE AFFECTED AREA(S) TOPICALLY TWICE DAILY 10/16/22   Georgina Quint, MD      Allergies    Lisinopril-hydrochlorothiazide    Review of Systems   Review of Systems  Physical Exam Updated Vital Signs BP 137/66   Pulse 61   Temp 98.7 F (37.1 C) (Oral)   Resp 18   SpO2 99%  Physical Exam Vitals and nursing note reviewed.  HENT:     Head:     Comments: Laceration to occipital area.  Around 6 cm total.  Shaped like an H or potentially the #4. Neck:     Comments: Cervical collar in place. Cardiovascular:     Rate and Rhythm: Regular rhythm.  Skin:    Capillary Refill: Capillary refill takes less than 2 seconds.  Neurological:     Mental Status: He is alert and oriented to person, place, and time.     ED Results / Procedures / Treatments   Labs (all labs ordered are listed, but only abnormal results are displayed) Labs Reviewed  BASIC METABOLIC PANEL - Abnormal; Notable for the following components:       Result Value   CO2 21 (*)    Glucose, Bld 109 (*)    Creatinine, Ser 2.40 (*)    GFR, Estimated 28 (*)    All other components within normal limits  CBC  TROPONIN I (HIGH SENSITIVITY)  TROPONIN I (HIGH SENSITIVITY)    EKG EKG Interpretation Date/Time:  Wednesday August 07 2023 15:37:07 EDT Ventricular Rate:  83 PR Interval:  186  QRS Duration:  92 QT Interval:  362 QTC Calculation: 425 R Axis:   61  Text Interpretation: Normal sinus rhythm Left ventricular hypertrophy with repolarization abnormality ( Sokolow-Lyon ) Abnormal ECG When compared with ECG of 19-Nov-2020 10:32, No significant change since last tracing Confirmed by Benjiman Core [phone]) on 08/07/2023 5:15:32 PM  Radiology DG Chest 2 View  Result Date: 08/07/2023 CLINICAL DATA:  Syncopal episode. Loss of consciousness with fall and trauma to the back of the head. EXAM: CHEST - 2 VIEW COMPARISON:  03/22/2023 FINDINGS: The heart size and mediastinal contours are within normal limits. Both lungs are clear. The visualized skeletal structures are unremarkable. IMPRESSION: No active cardiopulmonary disease. Electronically Signed   By: Paulina Fusi M.D.   On: 08/07/2023 17:39   CT HEAD WO CONTRAST ( )  Result Date: 08/07/2023 CLINICAL DATA:  Head trauma, minor (Age >= 65y); Neck trauma (Age >= 65y) EXAM: CT HEAD WITHOUT CONTRAST CT CERVICAL SPINE WITHOUT CONTRAST TECHNIQUE: Multidetector CT imaging of the head and cervical spine was performed following the standard protocol without intravenous contrast. Multiplanar CT image reconstructions of the cervical spine were also generated. RADIATION DOSE REDUCTION: This exam was performed according to the departmental dose-optimization program which includes automated exposure control, adjustment of the mA and/or kV according to patient size and/or use of iterative reconstruction technique. COMPARISON:  None Available. FINDINGS: CT HEAD FINDINGS Brain: No evidence of acute infarction,  hemorrhage, hydrocephalus, extra-axial collection or mass lesion/mass effect. Patchy white matter hypodensities are nonspecific but compatible with chronic microvascular disease. Cerebral atrophy. Vascular: No hyperdense vessel. Skull: No acute fracture. Sinuses/Orbits: Clear sinuses.  No acute orbital findings. CT CERVICAL SPINE FINDINGS Alignment: Reversal of the normal cervical lordosis. No substantial sagittal subluxation. Skull base and vertebrae: No acute fracture. No primary bone lesion or focal pathologic process. Soft tissues and spinal canal: No prevertebral fluid or swelling. No visible canal hematoma. Disc levels: Multilevel degenerative disc disease and facet/uncovertebral hypertrophy with varying degrees of neural foraminal stenosis. Upper chest: Visualized lung apices are clear. Emphysema (ICD10-J43.9). IMPRESSION: No evidence of acute abnormality intracranially or in the cervical spine. Electronically Signed   By: Feliberto Harts M.D.   On: 08/07/2023 17:30   CT Cervical Spine Wo Contrast  Result Date: 08/07/2023 CLINICAL DATA:  Head trauma, minor (Age >= 65y); Neck trauma (Age >= 65y) EXAM: CT HEAD WITHOUT CONTRAST CT CERVICAL SPINE WITHOUT CONTRAST TECHNIQUE: Multidetector CT imaging of the head and cervical spine was performed following the standard protocol without intravenous contrast. Multiplanar CT image reconstructions of the cervical spine were also generated. RADIATION DOSE REDUCTION: This exam was performed according to the departmental dose-optimization program which includes automated exposure control, adjustment of the mA and/or kV according to patient size and/or use of iterative reconstruction technique. COMPARISON:  None Available. FINDINGS: CT HEAD FINDINGS Brain: No evidence of acute infarction, hemorrhage, hydrocephalus, extra-axial collection or mass lesion/mass effect. Patchy white matter hypodensities are nonspecific but compatible with chronic microvascular disease.  Cerebral atrophy. Vascular: No hyperdense vessel. Skull: No acute fracture. Sinuses/Orbits: Clear sinuses.  No acute orbital findings. CT CERVICAL SPINE FINDINGS Alignment: Reversal of the normal cervical lordosis. No substantial sagittal subluxation. Skull base and vertebrae: No acute fracture. No primary bone lesion or focal pathologic process. Soft tissues and spinal canal: No prevertebral fluid or swelling. No visible canal hematoma. Disc levels: Multilevel degenerative disc disease and facet/uncovertebral hypertrophy with varying degrees of neural foraminal stenosis. Upper chest: Visualized lung apices are clear. Emphysema (ICD10-J43.9). IMPRESSION:  No evidence of acute abnormality intracranially or in the cervical spine. Electronically Signed   By: Feliberto Harts M.D.   On: 08/07/2023 17:30    Procedures Procedures    Medications Ordered in ED Medications  Tdap (BOOSTRIX) injection 0.5 mL (0.5 mLs Intramuscular Given 08/07/23 1746)  lidocaine-EPINEPHrine (XYLOCAINE W/EPI) 2 %-1:200000 (PF) injection 10 mL (10 mLs Infiltration Given by Other 08/07/23 1747)  sodium chloride 0.9 % bolus 500 mL (0 mLs Intravenous Stopped 08/07/23 1904)    ED Course/ Medical Decision Making/ A&P                                 Medical Decision Making Amount and/or Complexity of Data Reviewed Labs: ordered. Radiology: ordered.  Risk Prescription drug management.   Patient syncope and fall.  Hit head.  Has laceration which will need lacerations.  Will check basic blood work.  Head CT cervical spine CT reassuring.  EKG reassuring.  Will give fluid bolus.  Creatinine has increased to above 2 from a baseline appears to be around 1.7.  States he has short-term follow-up with his nephrologist.  Wound closed.  Blood pressure reassuring.   does not feel lightheaded.  Troponin x 2 was negative.  Discharge home.        Final Clinical Impression(s) / ED Diagnoses Final diagnoses:  Fall, initial encounter   Laceration of scalp, initial encounter  Elevated serum creatinine  Syncope, unspecified syncope type    Rx / DC Orders ED Discharge Orders     None         Benjiman Core, MD 08/07/23 1904

## 2023-08-07 NOTE — Discharge Instructions (Addendum)
The staples out in about 10 days.  Your kidney function looks worse than before and you can follow-up with your nephrologist for this.

## 2023-08-07 NOTE — ED Triage Notes (Signed)
Pt here from home with c/o syncopal episode when standing up after feeding the dog , pt fell back hitting the back of his head , bleeding controlled , no blood thinners

## 2023-08-13 ENCOUNTER — Ambulatory Visit (INDEPENDENT_AMBULATORY_CARE_PROVIDER_SITE_OTHER): Payer: Medicare HMO | Admitting: Emergency Medicine

## 2023-08-13 ENCOUNTER — Encounter: Payer: Self-pay | Admitting: Emergency Medicine

## 2023-08-13 VITALS — BP 138/64 | HR 62 | Temp 98.0°F | Ht 73.0 in | Wt 191.0 lb

## 2023-08-13 DIAGNOSIS — S0101XS Laceration without foreign body of scalp, sequela: Secondary | ICD-10-CM

## 2023-08-13 DIAGNOSIS — J449 Chronic obstructive pulmonary disease, unspecified: Secondary | ICD-10-CM

## 2023-08-13 DIAGNOSIS — I700 Atherosclerosis of aorta: Secondary | ICD-10-CM | POA: Diagnosis not present

## 2023-08-13 DIAGNOSIS — S0990XS Unspecified injury of head, sequela: Secondary | ICD-10-CM

## 2023-08-13 DIAGNOSIS — F17210 Nicotine dependence, cigarettes, uncomplicated: Secondary | ICD-10-CM

## 2023-08-13 DIAGNOSIS — N401 Enlarged prostate with lower urinary tract symptoms: Secondary | ICD-10-CM

## 2023-08-13 DIAGNOSIS — I10 Essential (primary) hypertension: Secondary | ICD-10-CM | POA: Diagnosis not present

## 2023-08-13 DIAGNOSIS — N138 Other obstructive and reflux uropathy: Secondary | ICD-10-CM

## 2023-08-13 DIAGNOSIS — N1832 Chronic kidney disease, stage 3b: Secondary | ICD-10-CM

## 2023-08-13 DIAGNOSIS — S0101XA Laceration without foreign body of scalp, initial encounter: Secondary | ICD-10-CM | POA: Insufficient documentation

## 2023-08-13 DIAGNOSIS — I2510 Atherosclerotic heart disease of native coronary artery without angina pectoris: Secondary | ICD-10-CM

## 2023-08-13 DIAGNOSIS — S0990XA Unspecified injury of head, initial encounter: Secondary | ICD-10-CM | POA: Insufficient documentation

## 2023-08-13 LAB — BASIC METABOLIC PANEL
BUN: 15 mg/dL (ref 6–23)
CO2: 27 meq/L (ref 19–32)
Calcium: 9.5 mg/dL (ref 8.4–10.5)
Chloride: 103 meq/L (ref 96–112)
Creatinine, Ser: 1.49 mg/dL (ref 0.40–1.50)
GFR: 46.59 mL/min — ABNORMAL LOW (ref >60.00)
Glucose, Bld: 97 mg/dL (ref 70–99)
Potassium: 4.4 meq/L (ref 3.5–5.1)
Sodium: 135 meq/L (ref 135–145)

## 2023-08-13 MED ORDER — ROSUVASTATIN CALCIUM 20 MG PO TABS: 20.0000 mg | ORAL_TABLET | Freq: Every day | ORAL | 3 refills | Status: DC

## 2023-08-13 NOTE — Assessment & Plan Note (Signed)
Diet and nutrition discussed.  Continue rosuvastatin 20 mg daily.

## 2023-08-13 NOTE — Patient Instructions (Signed)
Health Maintenance After Age 72 After age 72, you are at a higher risk for certain long-term diseases and infections as well as injuries from falls. Falls are a major cause of broken bones and head injuries in people who are older than age 72. Getting regular preventive care can help to keep you healthy and well. Preventive care includes getting regular testing and making lifestyle changes as recommended by your health care provider. Talk with your health care provider about: Which screenings and tests you should have. A screening is a test that checks for a disease when you have no symptoms. A diet and exercise plan that is right for you. What should I know about screenings and tests to prevent falls? Screening and testing are the best ways to find a health problem early. Early diagnosis and treatment give you the best chance of managing medical conditions that are common after age 72. Certain conditions and lifestyle choices may make you more likely to have a fall. Your health care provider may recommend: Regular vision checks. Poor vision and conditions such as cataracts can make you more likely to have a fall. If you wear glasses, make sure to get your prescription updated if your vision changes. Medicine review. Work with your health care provider to regularly review all of the medicines you are taking, including over-the-counter medicines. Ask your health care provider about any side effects that may make you more likely to have a fall. Tell your health care provider if any medicines that you take make you feel dizzy or sleepy. Strength and balance checks. Your health care provider may recommend certain tests to check your strength and balance while standing, walking, or changing positions. Foot health exam. Foot pain and numbness, as well as not wearing proper footwear, can make you more likely to have a fall. Screenings, including: Osteoporosis screening. Osteoporosis is a condition that causes  the bones to get weaker and break more easily. Blood pressure screening. Blood pressure changes and medicines to control blood pressure can make you feel dizzy. Depression screening. You may be more likely to have a fall if you have a fear of falling, feel depressed, or feel unable to do activities that you used to do. Alcohol use screening. Using too much alcohol can affect your balance and may make you more likely to have a fall. Follow these instructions at home: Lifestyle Do not drink alcohol if: Your health care provider tells you not to drink. If you drink alcohol: Limit how much you have to: 0-1 drink a day for women. 0-2 drinks a day for men. Know how much alcohol is in your drink. In the [place] one drink equals one 12 oz bottle of beer (355 mL), one 5 oz glass of wine (148 mL), or one 1 oz glass of hard liquor (44 mL). Do not use any products that contain nicotine or tobacco. These products include cigarettes, chewing tobacco, and vaping devices, such as e-cigarettes. If you need help quitting, ask your health care provider. Activity  Follow a regular exercise program to stay fit. This will help you maintain your balance. Ask your health care provider what types of exercise are appropriate for you. If you need a cane or walker, use it as recommended by your health care provider. Wear supportive shoes that have nonskid soles. Safety  Remove any tripping hazards, such as rugs, cords, and clutter. Install safety equipment such as grab bars in bathrooms and safety rails on stairs. Keep rooms and walkways   well-lit. General instructions Talk with your health care provider about your risks for falling. Tell your health care provider if: You fall. Be sure to tell your health care provider about all falls, even ones that seem minor. You feel dizzy, tiredness (fatigue), or off-balance. Take over-the-counter and prescription medicines only as told by your health care provider. These include  supplements. Eat a healthy diet and maintain a healthy weight. A healthy diet includes low-fat dairy products, low-fat (lean) meats, and fiber from whole grains, beans, and lots of fruits and vegetables. Stay current with your vaccines. Schedule regular health, dental, and eye exams. Summary Having a healthy lifestyle and getting preventive care can help to protect your health and wellness after age 72. Screening and testing are the best way to find a health problem early and help you avoid having a fall. Early diagnosis and treatment give you the best chance for managing medical conditions that are more common for people who are older than age 72. Falls are a major cause of broken bones and head injuries in people who are older than age 72. Take precautions to prevent a fall at home. Work with your health care provider to learn what changes you can make to improve your health and wellness and to prevent falls. This information is not intended to replace advice given to you by your health care provider. Make sure you discuss any questions you have with your health care provider. Document Revised: 03/20/2021 Document Reviewed: 03/20/2021 [employer] Patient Education  2024 [employer]  

## 2023-08-13 NOTE — Assessment & Plan Note (Signed)
BP Readings from Last 3 Encounters:  08/13/23 138/64  08/07/23 137/66  06/04/23 (!) 164/64  Well-controlled hypertension Continue diltiazem 240 mg daily Cardiovascular risks associated with hypertension discussed Diet and nutrition discussed

## 2023-08-13 NOTE — Assessment & Plan Note (Signed)
Healing well.  No signs of infection.  Staples in place Recommend staple removal in 5 to 7 days

## 2023-08-13 NOTE — Assessment & Plan Note (Addendum)
Most recent BMP shows worsening GFR Schedule to follow-up with nephrologist Recommend to repeat BMP today Suspect acute on chronic results Advised to stay well-hydrated and avoid NSAIDs

## 2023-08-13 NOTE — Assessment & Plan Note (Signed)
Stable chronic condition Continue Symbicort twice a day and albuterol as rescue inhaler

## 2023-08-13 NOTE — Assessment & Plan Note (Signed)
No signs of concussion Unremarkable CT scan of brain Report reviewed with patient Recovering well.  No complications.

## 2023-08-13 NOTE — Assessment & Plan Note (Signed)
Stable chronic condition continues Myrbetriq 25 mg daily and Rapaflo 8 mg at bedtime

## 2023-08-13 NOTE — Assessment & Plan Note (Signed)
Cardiovascular and cancer risks associated with smoking discussed. °Smoking cessation advice given. °

## 2023-08-13 NOTE — Progress Notes (Signed)
Andrew [name] 72 y.o.   Chief Complaint  Patient presents with   Follow-up    Patient states he fell on 9/25 and was seen in the ED. Patient wants to go over his imaging results. No current issues     HISTORY OF PRESENT ILLNESS: This is a 72 y.o. male A1A here for follow-up of emergency department visit on 08/07/2023 when he presented after syncopal episode sustaining head injury Sustained scalp laceration which was sutured with staples.  Healing well.  No complications. Blood work revealed worsened renal function. CT scan of brain and cervical spine unremarkable Unremarkable chest x-ray also. Has been doing well.  No complications. No complaints or any other medical concerns today.  HPI   Prior to Admission medications   Medication Sig Start Date End Date Taking? Authorizing Provider  acetaminophen (TYLENOL) 500 MG tablet Take 1,000 mg by mouth every 6 (six) hours as needed for moderate pain.   Yes [provider]  albuterol (PROVENTIL HFA;VENTOLIN HFA) 108 (90 Base) MCG/ACT inhaler Inhale 2 puffs into the lungs every 4 (four) hours as needed for wheezing or shortness of breath (cough, shortness of breath or wheezing.). 06/29/17  Yes Sherren Mocha, MD  cetirizine (ZYRTEC) 10 MG tablet TAKE 1 TABLET EVERY DAY 08/15/22  Yes Dayveon Halley, Eilleen Kempf, MD  fluticasone [facility]) 50 MCG/ACT nasal spray Place 1 spray into both nostrils daily. 05/11/21  Yes Nyoka Cowden, MD  mirabegron ER (MYRBETRIQ) 25 MG TB24 tablet Take 1 tablet (25 mg total) by mouth daily. 03/25/23  Yes McKenzie, Mardene Celeste, MD  olmesartan (BENICAR) 40 MG tablet TAKE 1 TABLET EVERY DAY 11/14/22  Yes Presly Steinruck, Eilleen Kempf, MD  omeprazole (PRILOSEC OTC) 20 MG tablet Take 20 mg by mouth daily as needed (acid reflux).   Yes [provider]  rosuvastatin (CRESTOR) 20 MG tablet Take 1 tablet (20 mg total) by mouth daily. Patient needs an appointment for further refills. 2 nd attempt 03/26/23  Yes Georgeanna Lea,  MD  sildenafil (VIAGRA) 100 MG tablet Take 1 tablet (100 mg total) by mouth daily as needed for erectile dysfunction. 05/15/23  Yes McKenzie, Mardene Celeste, MD  silodosin (RAPAFLO) 8 MG CAPS capsule Take 1 capsule (8 mg total) by mouth at bedtime. 11/16/22  Yes McKenzie, Mardene Celeste, MD  solifenacin (VESICARE) 5 MG tablet Take 1 tablet (5 mg total) by mouth daily. 05/15/23  Yes McKenzie, Mardene Celeste, MD  SYMBICORT 160-4.5 MCG/ACT inhaler INHALE 2 PUFFS FIRST THING IN THE MORNING AND THEN 2 PUFFS ABOUT 12 HOURS LATER AS DIRECTED 04/11/22  Yes Nyoka Cowden, MD  tamsulosin (FLOMAX) 0.4 MG CAPS capsule Take 1 capsule (0.4 mg total) by mouth in the morning and at bedtime. 05/15/23  Yes McKenzie, Mardene Celeste, MD  Tiotropium Bromide Monohydrate (SPIRIVA RESPIMAT) 2.5 MCG/ACT AERS Inhale 2 puffs into the lungs daily. 09/10/22  Yes Nyoka Cowden, MD  traMADol (ULTRAM) 50 MG tablet Take 1 tablet (50 mg total) by mouth every 6 (six) hours as needed. 11/30/21  Yes McKenzie, Mardene Celeste, MD  aspirin EC 81 MG tablet Take 1 tablet (81 mg total) by mouth daily. Swallow whole. Patient not taking: Reported on 08/13/2023 01/19/22   Georgeanna Lea, MD  diltiazem (DILACOR XR) 240 MG 24 hr capsule TAKE 1 CAPSULE EVERY DAY 03/05/23   Georgina Quint, MD  tadalafil (CIALIS) 20 MG tablet Take 1 tablet (20 mg total) by mouth daily as needed for erectile dysfunction. 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   Cervical radiculitis 08/23/2021   Preop cardiovascular exam 06/23/2021   Atherosclerosis of aorta (HCC) 04/20/2021   Acquired thrombophilia  (HCC) 05/05/2020   Prostate cancer (HCC) 01/14/2018   Carotid artery stenosis, asymptomatic, bilateral 12/06/2017   Vitamin D deficiency 12/03/2017   Prediabetes 11/14/2017   Chronic kidney disease (CKD) stage G3a/A2, moderately decreased glomerular filtration rate (GFR) between 45-59 mL/min/1.73 square meter and albuminuria creatinine ratio between 30-299 mg/g (HCC) 11/13/2017   Cigarette smoker 08/20/2017   COPD GOLD 2 still smoking  07/31/2017   Coronary atherosclerosis of native coronary artery 07/31/2017   Benign prostatic hyperplasia with urinary obstruction 05/30/2016   Allergic rhinitis 06/01/2008   DENTAL CARIES 06/01/2008   GERD 03/04/2008   Hyperlipidemia LDL goal <70 01/30/2008   Essential hypertension 01/30/2008   PERIPHERAL VASCULAR DISEASE 01/30/2008   COCAINE ABUSE 01/30/2008    Past Medical History:  Diagnosis Date   Allergic rhinitis    Anal lesion    polypoid   Atherosclerosis of aorta (HCC)    Carotid artery stenosis, asymptomatic, bilateral 11/2017   last carotid ultrasound in epic 07-19-2021  right ECA >50% and left ICA 40-59% stenosis   CKD (chronic kidney disease), stage III (HCC)    followed by pcp;  previously followed by nephrologist--- dr Eulogio Ditch  (per pt lov appprox 2020)   DOE (dyspnea on exertion)    10-27-2021  pt stated has normal doe due to COPD, but past couple days it has even more so, Dr Sherene Sires started pt on prednisone today   ED (erectile dysfunction)    GERD (gastroesophageal reflux disease)    History of adenomatous polyp of colon    Hyperplasia of prostate with lower urinary tract symptoms (LUTS)    Hypertension    followed by pcp;   nuclear stress test 07-13-2021 low risk no ischemia w/ nuclear ef 62%;  echo 07-13-2021 ef 55-60%, aortic valve w/ mild AR with mild to moderate sclerosis, RVSP 36.42mmHg   Peripheral vascular disease (HCC)    Pre-diabetes    Prostate cancer [facility]) 2018   urologist-- dr Ronne Binning;  first dx 02/ 2018 Gleason  3+3, PSA 6.4 active survillance;  MRI fusion bx 04-21-2021, Gleason 3+4, PSA 20.5   Renal cyst, acquired    bilateral   Stage 2 moderate COPD by GOLD classification [facility])    pulmonologist--- dr Sherene Sires;  emphysema still smoking ;  pt does not have oxygen (10-27-2021  pt stated trying to quit last cigarette in 7 days, had cut done to 3-4 cig per week)   Wears dentures    full upper and partial lower   Wears glasses     Past Surgical History:  Procedure Laterality Date   COLONOSCOPY WITH PROPOFOL  03/28/2021   by dr Christella Hartigan   GOLD SEED IMPLANT N/A 11/30/2021   Procedure: GOLD SEED IMPLANT;  Surgeon: Malen Gauze, MD;  Location: [facility];  Service: Urology;  Laterality: N/A;   NO PAST SURGERIES     SPACE OAR INSTILLATION N/A 11/30/2021   Procedure: SPACE OAR INSTILLATION;  Surgeon: Ronne Binning,  Mardene Celeste, MD;  Location: [facility];  Service: Urology;  Laterality: N/A;   TRANSANAL EXCISION OF RECTAL MASS N/A 10/30/2021   Procedure: TRANSANAL EXCISION OF ANAL CANAL POLYPOID LESION;  Surgeon: Andria Meuse, MD;  Location: [facility];  Service: General;  Laterality: N/A;    Social History   Socioeconomic History   Marital status: Married    Spouse name: Not on file   Number of children: 1   Years of education: Not on file   Highest education level: Some college, no degree  Occupational History   Not on file  Tobacco Use   Smoking status: Some Days    Types: Cigarettes   Smokeless tobacco: Never   Tobacco comments:    10-27-2021  pt stated trying to quit, last cigarette 7 days ago,  had gotten down to 3-4 cig per week (pt started smoking age 45)  Vaping Use   Vaping status: Never Used  Substance and Sexual Activity   Alcohol use: Not Currently    Comment: occasional   Drug use: Not Currently    Types: Marijuana, Cocaine    Comment: 10-27-2021  pt stated last cocine age 12s and last marijuana age early 23s   Sexual activity: Yes  Other Topics Concern   Not on  file  Social History Narrative   Not on file   Social Determinants of Health   Financial Resource Strain: Low Risk  (04/03/2023)   Overall Financial Resource Strain (CARDIA)    Difficulty of Paying Living Expenses: Not hard at all  Food Insecurity: No Food Insecurity (04/03/2023)   Hunger Vital Sign    Worried About Running Out of Food in the Last Year: Never true    Ran Out of Food in the Last Year: Never true  Transportation Needs: No Transportation Needs (04/03/2023)   PRAPARE - Administrator, Civil Service (Medical): No    Lack of Transportation (Non-Medical): No  Physical Activity: Insufficiently Active (04/03/2023)   Exercise Vital Sign    Days of Exercise per Week: 2 days    Minutes of Exercise per Session: 20 min  Stress: No Stress Concern Present (04/03/2023)   [employer] of Occupational Health - Occupational Stress Questionnaire    Feeling of Stress : Not at all  Social Connections: Moderately Integrated (04/03/2023)   Social Connection and Isolation Panel [NHANES]    Frequency of Communication with Friends and Family: Twice a week    Frequency of Social Gatherings with Friends and Family: Once a week    Attends Religious Services: Never    Database administrator or Organizations: Yes    Attends Banker Meetings: 1 to 4 times per year    Marital Status: Married  Catering manager Violence: Not At Risk (04/03/2023)   Humiliation, Afraid, Rape, and Kick questionnaire    Fear of Current or Ex-Partner: No    Emotionally Abused: No    Physically Abused: No    Sexually Abused: No    Family History  Problem Relation Age of Onset   Colon cancer Neg Hx    Esophageal cancer Neg Hx    Rectal cancer Neg Hx    Stomach cancer Neg Hx    Colon polyps Neg Hx      Review of Systems  Constitutional: Negative.  Negative for chills and fever.  HENT: Negative.  Negative for congestion and sore throat.   Respiratory: Negative.  Negative for cough and  shortness of  breath.   Cardiovascular: Negative.  Negative for chest pain and palpitations.  Gastrointestinal:  Negative for abdominal pain, nausea and vomiting.  Genitourinary: Negative.  Negative for dysuria and hematuria.  Skin: Negative.   Neurological: Negative.  Negative for dizziness and headaches.  All other systems reviewed and are negative.   Vitals:   08/13/23 1416  BP: 138/64  Pulse: 62  Temp: 98 F (36.7 C)  SpO2: 97%    Physical Exam Vitals reviewed.  Constitutional:      Appearance: Normal appearance.  HENT:     Head: Normocephalic.     Mouth/Throat:     Mouth: Mucous membranes are moist.     Pharynx: Oropharynx is clear.  Eyes:     Extraocular Movements: Extraocular movements intact.     Pupils: Pupils are equal, round, and reactive to light.  Cardiovascular:     Rate and Rhythm: Normal rate and regular rhythm.     Pulses: Normal pulses.     Heart sounds: Normal heart sounds.  Pulmonary:     Effort: Pulmonary effort is normal.     Breath sounds: Normal breath sounds.  Musculoskeletal:     Cervical back: No tenderness.  Lymphadenopathy:     Cervical: No cervical adenopathy.  Skin:    General: Skin is warm and dry.     Comments: Scalp laceration healing well.  Staples in place.  No signs of infection.  Neurological:     General: No focal deficit present.     Mental Status: He is alert and oriented to person, place, and time.  Psychiatric:        Mood and Affect: Mood normal.        Behavior: Behavior normal.      ASSESSMENT & PLAN: A total of 46 minutes was spent with the patient and counseling/coordination of care regarding preparing for this visit, review of most recent office visit notes, review of most recent emergency department visit notes, review of most recent imaging reports, review of most recent blood work results, review of multiple chronic medical conditions under management, review of all medications, education on nutrition,  prognosis, documentation, and need for follow-up.  Problem List Items Addressed This Visit       Cardiovascular and Mediastinum   Essential hypertension - Primary    BP Readings from Last 3 Encounters:  08/13/23 138/64  08/07/23 137/66  06/04/23 (!) 164/64  Well-controlled hypertension Continue diltiazem 240 mg daily Cardiovascular risks associated with hypertension discussed Diet and nutrition discussed       Relevant Medications   rosuvastatin (CRESTOR) 20 MG tablet   Coronary atherosclerosis of native coronary artery    Stable.  No recent anginal episodes.      Relevant Medications   rosuvastatin (CRESTOR) 20 MG tablet   Atherosclerosis of aorta (HCC)    Diet and nutrition discussed Continue rosuvastatin 20 mg daily      Relevant Medications   rosuvastatin (CRESTOR) 20 MG tablet     Respiratory   COPD GOLD 2 still smoking     Stable chronic condition Continue Symbicort twice a day and albuterol as rescue inhaler        Genitourinary   Benign prostatic hyperplasia with urinary obstruction    Stable chronic condition continues Myrbetriq 25 mg daily and Rapaflo 8 mg at bedtime      Stage 3b chronic kidney disease (HCC)    Most recent BMP shows worsening GFR Schedule to follow-up with nephrologist Recommend to repeat BMP  today Suspect acute on chronic results Advised to stay well-hydrated and avoid NSAIDs      Relevant Orders   Basic metabolic panel     Other   Cigarette smoker    Cardiovascular and cancer risks associated with smoking discussed Smoking cessation advice given.      Head injury    No signs of concussion Unremarkable CT scan of brain Report reviewed with patient Recovering well.  No complications.      Scalp laceration    Healing well.  No signs of infection.  Staples in place Recommend staple removal in 5 to 7 days      Patient Instructions  Health Maintenance After Age 22 After age 56, you are at a higher risk for certain  long-term diseases and infections as well as injuries from falls. Falls are a major cause of broken bones and head injuries in people who are older than age 47. Getting regular preventive care can help to keep you healthy and well. Preventive care includes getting regular testing and making lifestyle changes as recommended by your health care provider. Talk with your health care provider about: Which screenings and tests you should have. A screening is a test that checks for a disease when you have no symptoms. A diet and exercise plan that is right for you. What should I know about screenings and tests to prevent falls? Screening and testing are the best ways to find a health problem early. Early diagnosis and treatment give you the best chance of managing medical conditions that are common after age 30. Certain conditions and lifestyle choices may make you more likely to have a fall. Your health care provider may recommend: Regular vision checks. Poor vision and conditions such as cataracts can make you more likely to have a fall. If you wear glasses, make sure to get your prescription updated if your vision changes. Medicine review. Work with your health care provider to regularly review all of the medicines you are taking, including over-the-counter medicines. Ask your health care provider about any side effects that may make you more likely to have a fall. Tell your health care provider if any medicines that you take make you feel dizzy or sleepy. Strength and balance checks. Your health care provider may recommend certain tests to check your strength and balance while standing, walking, or changing positions. Foot health exam. Foot pain and numbness, as well as not wearing proper footwear, can make you more likely to have a fall. Screenings, including: Osteoporosis screening. Osteoporosis is a condition that causes the bones to get weaker and break more easily. Blood pressure screening. Blood  pressure changes and medicines to control blood pressure can make you feel dizzy. Depression screening. You may be more likely to have a fall if you have a fear of falling, feel depressed, or feel unable to do activities that you used to do. Alcohol use screening. Using too much alcohol can affect your balance and may make you more likely to have a fall. Follow these instructions at home: Lifestyle Do not drink alcohol if: Your health care provider tells you not to drink. If you drink alcohol: Limit how much you have to: 0-1 drink a day for women. 0-2 drinks a day for men. Know how much alcohol is in your drink. In the [place] one drink equals one 12 oz bottle of beer (355 mL), one 5 oz glass of wine (148 mL), or one 1 oz glass of hard liquor (44 mL).  Do not use any products that contain nicotine or tobacco. These products include cigarettes, chewing tobacco, and vaping devices, such as e-cigarettes. If you need help quitting, ask your health care provider. Activity  Follow a regular exercise program to stay fit. This will help you maintain your balance. Ask your health care provider what types of exercise are appropriate for you. If you need a cane or walker, use it as recommended by your health care provider. Wear supportive shoes that have nonskid soles. Safety  Remove any tripping hazards, such as rugs, cords, and clutter. Install safety equipment such as grab bars in bathrooms and safety rails on stairs. Keep rooms and walkways well-lit. General instructions Talk with your health care provider about your risks for falling. Tell your health care provider if: You fall. Be sure to tell your health care provider about all falls, even ones that seem minor. You feel dizzy, tiredness (fatigue), or off-balance. Take over-the-counter and prescription medicines only as told by your health care provider. These include supplements. Eat a healthy diet and maintain a healthy weight. A healthy diet  includes low-fat dairy products, low-fat (lean) meats, and fiber from whole grains, beans, and lots of fruits and vegetables. Stay current with your vaccines. Schedule regular health, dental, and eye exams. Summary Having a healthy lifestyle and getting preventive care can help to protect your health and wellness after age 81. Screening and testing are the best way to find a health problem early and help you avoid having a fall. Early diagnosis and treatment give you the best chance for managing medical conditions that are more common for people who are older than age 45. Falls are a major cause of broken bones and head injuries in people who are older than age 78. Take precautions to prevent a fall at home. Work with your health care provider to learn what changes you can make to improve your health and wellness and to prevent falls. This information is not intended to replace advice given to you by your health care provider. Make sure you discuss any questions you have with your health care provider. Document Revised: 03/20/2021 Document Reviewed: 03/20/2021 Elsevier Patient Education  2024 Elsevier Inc.    Edwina Barth, MD [facility]

## 2023-08-13 NOTE — Assessment & Plan Note (Signed)
Stable.  No recent anginal episodes.

## 2023-08-20 ENCOUNTER — Encounter: Payer: Self-pay | Admitting: Emergency Medicine

## 2023-08-20 ENCOUNTER — Ambulatory Visit: Payer: Medicare HMO | Admitting: Emergency Medicine

## 2023-08-20 VITALS — BP 140/78 | HR 83 | Temp 98.2°F | Ht 73.0 in | Wt 192.0 lb

## 2023-08-20 DIAGNOSIS — Z4802 Encounter for removal of sutures: Secondary | ICD-10-CM | POA: Diagnosis not present

## 2023-08-20 DIAGNOSIS — S0101XA Laceration without foreign body of scalp, initial encounter: Secondary | ICD-10-CM | POA: Diagnosis not present

## 2023-08-20 NOTE — Progress Notes (Signed)
Andrew [name] 72 y.o.   Chief Complaint  Patient presents with   Suture / Staple Removal    HISTORY OF PRESENT ILLNESS: This is a 72 y.o. male here for staple removal of scalp laceration repaired about 10 days ago  Suture / Staple Removal     Prior to Admission medications   Medication Sig Start Date End Date Taking? Authorizing Provider  acetaminophen (TYLENOL) 500 MG tablet Take 1,000 mg by mouth every 6 (six) hours as needed for moderate pain.   Yes [provider]  albuterol (PROVENTIL HFA;VENTOLIN HFA) 108 (90 Base) MCG/ACT inhaler Inhale 2 puffs into the lungs every 4 (four) hours as needed for wheezing or shortness of breath (cough, shortness of breath or wheezing.). 06/29/17  Yes Sherren Mocha, MD  cetirizine (ZYRTEC) 10 MG tablet TAKE 1 TABLET EVERY DAY 08/15/22  Yes Georgina Quint, MD  diltiazem (DILACOR XR) 240 MG 24 hr capsule TAKE 1 CAPSULE EVERY DAY 03/05/23  Yes Aviendha Azbell, Eilleen Kempf, MD  fluticasone [facility]) 50 MCG/ACT nasal spray Place 1 spray into both nostrils daily. 05/11/21  Yes Nyoka Cowden, MD  mirabegron ER (MYRBETRIQ) 25 MG TB24 tablet Take 1 tablet (25 mg total) by mouth daily. 03/25/23  Yes McKenzie, Mardene Celeste, MD  olmesartan (BENICAR) 40 MG tablet TAKE 1 TABLET EVERY DAY 11/14/22  Yes Odai Wimmer, Eilleen Kempf, MD  omeprazole (PRILOSEC OTC) 20 MG tablet Take 20 mg by mouth daily as needed (acid reflux).   Yes [provider]  rosuvastatin (CRESTOR) 20 MG tablet Take 1 tablet (20 mg total) by mouth daily. 08/13/23  Yes Micala Saltsman, Eilleen Kempf, MD  sildenafil (VIAGRA) 100 MG tablet Take 1 tablet (100 mg total) by mouth daily as needed for erectile dysfunction. 05/15/23  Yes McKenzie, Mardene Celeste, MD  silodosin (RAPAFLO) 8 MG CAPS capsule Take 1 capsule (8 mg total) by mouth at bedtime. 11/16/22  Yes McKenzie, Mardene Celeste, MD  solifenacin (VESICARE) 5 MG tablet Take 1 tablet (5 mg total) by mouth daily. 05/15/23  Yes McKenzie, Mardene Celeste, MD  SYMBICORT  160-4.5 MCG/ACT inhaler INHALE 2 PUFFS FIRST THING IN THE MORNING AND THEN 2 PUFFS ABOUT 12 HOURS LATER AS DIRECTED 04/11/22  Yes Nyoka Cowden, MD  tadalafil (CIALIS) 20 MG tablet Take 1 tablet (20 mg total) by mouth daily as needed for erectile dysfunction. 09/13/22  Yes McKenzie, Mardene Celeste, MD  tamsulosin (FLOMAX) 0.4 MG CAPS capsule Take 1 capsule (0.4 mg total) by mouth in the morning and at bedtime. 05/15/23  Yes McKenzie, Mardene Celeste, MD  Tiotropium Bromide Monohydrate (SPIRIVA RESPIMAT) 2.5 MCG/ACT AERS Inhale 2 puffs into the lungs daily. 09/10/22  Yes Nyoka Cowden, MD  traMADol (ULTRAM) 50 MG tablet Take 1 tablet (50 mg total) by mouth every 6 (six) hours as needed. 11/30/21  Yes McKenzie, Mardene Celeste, MD  triamcinolone cream (KENALOG) 0.1 % APPLY TO THE AFFECTED AREA(S) TOPICALLY TWICE DAILY 10/16/22  Yes Riannon Mukherjee, Eilleen Kempf, MD  aspirin EC 81 MG tablet Take 1 tablet (81 mg total) by mouth daily. Swallow whole. 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   Cervical radiculitis 08/23/2021   Atherosclerosis  of aorta (HCC) 04/20/2021   Acquired thrombophilia (HCC) 05/05/2020   Prostate cancer (HCC) 01/14/2018   Carotid artery stenosis, asymptomatic, bilateral 12/06/2017   Vitamin D deficiency 12/03/2017   Prediabetes 11/14/2017   Stage 3b chronic kidney disease (HCC) 11/13/2017   Cigarette smoker 08/20/2017   COPD GOLD 2 still smoking  07/31/2017   Coronary atherosclerosis of native coronary artery 07/31/2017   Benign prostatic hyperplasia with urinary obstruction 05/30/2016   Allergic rhinitis 06/01/2008   DENTAL CARIES 06/01/2008   GERD 03/04/2008   Hyperlipidemia LDL  goal <70 01/30/2008   Essential hypertension 01/30/2008   PERIPHERAL VASCULAR DISEASE 01/30/2008   COCAINE ABUSE 01/30/2008    Past Medical History:  Diagnosis Date   Allergic rhinitis    Anal lesion    polypoid   Atherosclerosis of aorta (HCC)    Carotid artery stenosis, asymptomatic, bilateral 11/2017   last carotid ultrasound in epic 07-19-2021  right ECA >50% and left ICA 40-59% stenosis   CKD (chronic kidney disease), stage III (HCC)    followed by pcp;  previously followed by nephrologist--- dr Eulogio Ditch  (per pt lov appprox 2020)   DOE (dyspnea on exertion)    10-27-2021  pt stated has normal doe due to COPD, but past couple days it has even more so, Dr Sherene Sires started pt on prednisone today   ED (erectile dysfunction)    GERD (gastroesophageal reflux disease)    History of adenomatous polyp of colon    Hyperplasia of prostate with lower urinary tract symptoms (LUTS)    Hypertension    followed by pcp;   nuclear stress test 07-13-2021 low risk no ischemia w/ nuclear ef 62%;  echo 07-13-2021 ef 55-60%, aortic valve w/ mild AR with mild to moderate sclerosis, RVSP 36.47mmHg   Peripheral vascular disease (HCC)    Pre-diabetes    Prostate cancer [facility]) 2018   urologist-- dr Ronne Binning;  first dx 02/ 2018 Gleason 3+3, PSA 6.4 active survillance;  MRI fusion bx 04-21-2021, Gleason 3+4, PSA 20.5   Renal cyst, acquired    bilateral   Stage 2 moderate COPD by GOLD classification [facility])    pulmonologist--- dr Sherene Sires;  emphysema still smoking ;  pt does not have oxygen (10-27-2021  pt stated trying to quit last cigarette in 7 days, had cut done to 3-4 cig per week)   Wears dentures    full upper and partial lower   Wears glasses     Past Surgical History:  Procedure Laterality Date   COLONOSCOPY WITH PROPOFOL  03/28/2021   by dr Christella Hartigan   GOLD SEED IMPLANT N/A 11/30/2021   Procedure: GOLD SEED IMPLANT;  Surgeon: Malen Gauze, MD;  Location: [facility];  Service: Urology;  Laterality:  N/A;   NO PAST SURGERIES     SPACE OAR INSTILLATION N/A 11/30/2021   Procedure: SPACE OAR INSTILLATION;  Surgeon: Malen Gauze, MD;  Location: [facility];  Service: Urology;  Laterality: N/A;   TRANSANAL EXCISION OF RECTAL MASS N/A 10/30/2021   Procedure: TRANSANAL EXCISION OF ANAL CANAL POLYPOID LESION;  Surgeon: Andria Meuse, MD;  Location: [facility];  Service: General;  Laterality: N/A;    Social History   Socioeconomic History   Marital status: Married    Spouse name: Not on file   Number of children: 1   Years of education: Not on file   Highest education level: Some college, no degree  Occupational History   Not on file  Tobacco Use  Smoking status: Some Days    Types: Cigarettes   Smokeless tobacco: Never   Tobacco comments:    10-27-2021  pt stated trying to quit, last cigarette 7 days ago,  had gotten down to 3-4 cig per week (pt started smoking age 37)  Vaping Use   Vaping status: Never Used  Substance and Sexual Activity   Alcohol use: Not Currently    Comment: occasional   Drug use: Not Currently    Types: Marijuana, Cocaine    Comment: 10-27-2021  pt stated last cocine age 70s and last marijuana age early 21s   Sexual activity: Yes  Other Topics Concern   Not on file  Social History Narrative   Not on file   Social Determinants of Health   Financial Resource Strain: Low Risk  (04/03/2023)   Overall Financial Resource Strain (CARDIA)    Difficulty of Paying Living Expenses: Not hard at all  Food Insecurity: No Food Insecurity (04/03/2023)   Hunger Vital Sign    Worried About Running Out of Food in the Last Year: Never true    Ran Out of Food in the Last Year: Never true  Transportation Needs: No Transportation Needs (04/03/2023)   PRAPARE - Administrator, Civil Service (Medical): No    Lack of Transportation (Non-Medical): No  Physical Activity: Insufficiently Active (04/03/2023)   Exercise Vital Sign    Days of  Exercise per Week: 2 days    Minutes of Exercise per Session: 20 min  Stress: No Stress Concern Present (04/03/2023)   [employer] of Occupational Health - Occupational Stress Questionnaire    Feeling of Stress : Not at all  Social Connections: Moderately Integrated (04/03/2023)   Social Connection and Isolation Panel [NHANES]    Frequency of Communication with Friends and Family: Twice a week    Frequency of Social Gatherings with Friends and Family: Once a week    Attends Religious Services: Never    Database administrator or Organizations: Yes    Attends Banker Meetings: 1 to 4 times per year    Marital Status: Married  Catering manager Violence: Not At Risk (04/03/2023)   Humiliation, Afraid, Rape, and Kick questionnaire    Fear of Current or Ex-Partner: No    Emotionally Abused: No    Physically Abused: No    Sexually Abused: No    Family History  Problem Relation Age of Onset   Colon cancer Neg Hx    Esophageal cancer Neg Hx    Rectal cancer Neg Hx    Stomach cancer Neg Hx    Colon polyps Neg Hx      Review of Systems  Constitutional: Negative.  Negative for chills and fever.  HENT:  Negative for congestion and sore throat.   Respiratory: Negative.  Negative for cough and shortness of breath.   Cardiovascular: Negative.  Negative for chest pain.  Gastrointestinal:  Negative for abdominal pain, nausea and vomiting.  Skin: Negative.   Neurological: Negative.  Negative for dizziness and headaches.  All other systems reviewed and are negative.   Vitals:   08/20/23 1446  BP: (!) 140/78  Pulse: 83  Temp: 98.2 F (36.8 C)  SpO2: 93%    Physical Exam Constitutional:      Appearance: Normal appearance.  Eyes:     Extraocular Movements: Extraocular movements intact.  Cardiovascular:     Rate and Rhythm: Normal rate.  Pulmonary:     Effort: Pulmonary effort is  normal.  Skin:    General: Skin is warm and dry.     Comments: Well-healed scalp  laceration 7 staples in place  Neurological:     Mental Status: He is alert and oriented to person, place, and time.  Psychiatric:        Mood and Affect: Mood normal.        Behavior: Behavior normal.    Procedure note: After getting consent from patient staples were removed without difficulty or complication.  Tolerated procedure well.  No bleeding.  ASSESSMENT & PLAN: Problem List Items Addressed This Visit       Other   Scalp laceration - Primary    Well-healed without infection or complications Staples removed without difficulty or complication      Other Visit Diagnoses     Encounter for staple removal          Patient Instructions  Suture Removal, Care After The following information offers guidance on how to care for yourself after your procedure. Your health care provider may also give you more specific instructions. If you have problems or questions, contact your health care provider. What can I expect after the procedure? After your stitches (sutures) are removed, it is common to have: Some discomfort and swelling in the area. Slight redness in the area. Follow these instructions at home: If you have a dressing: Wash your hands with soap and water for at least 20 seconds before and after you change your bandage (dressing). If soap and water are not available, use hand sanitizer. Change your dressing as told by your health care provider. If your dressing becomes wet or dirty, or develops a bad smell, change it as soon as possible. If your dressing sticks to your skin, pour warm, clean water over it until it loosens and can be removed without pulling apart the wound edges. Pat the area dry with a soft, clean towel. Do not rub the wound because that may cause bleeding. Wound care  Check your wound every day for signs of infection. Check for: More redness, swelling, or pain. Fluid or blood. New warmth, a rash, or hardness at the wound site. Pus or a bad  smell. Wash your hands with soap and water for at least 20 seconds before and after touching your wound. If soap and water are not available, use hand sanitizer. Keep the wound area dry and clean. Clean and pat the wound dry as told by your health care provider. Apply cream or ointment only as told by your health care provider. If skin glue or adhesive strips were applied after sutures were removed, leave these closures in place. They may need to stay in place for 2 weeks or longer. If adhesive strip edges start to loosen and curl up, you may trim the loose edges. Do not remove adhesive strips completely unless your health care provider tells you to do that. Continue to protect the wound from injury. Do not pick at your wound. Picking can cause an infection. Bathing Do not take baths, swim, or use a hot tub until your health care provider approves. Ask your health care provider if you may take showers. Follow these steps for showering: If you have a dressing, remove it before getting into the shower. In the shower, allow soapy water to get on the wound. Avoid scrubbing the wound. When you get out of the shower, dry the wound by patting it with a clean towel. Reapply a dressing over the wound, if needed.  Scar care When your wound has completely healed, help decrease the size of your scar by: Wearing sunscreen over the scar or covering it with clothing when you are outside. New scars get sunburned easily, which can make scarring worse. Gently massaging the scarred area. This can decrease scar thickness. General instructions Take over-the-counter and prescription medicines only as told by your health care provider. Keep all follow-up visits. This is important. Contact a health care provider if: You have more redness, swelling, or pain around your wound. You have fluid or blood coming from your wound. You have new warmth, a rash, or hardness at the wound site. You have pus or a bad smell coming  from your wound. Your wound opens up. Get help right away if: You have a fever or chills. You have red streaks coming from your wound. Summary After your sutures are removed, it is common to have some discomfort and swelling in the area. Wash your hands with soap and water before you change your bandage (dressing). Keep the wound area dry and clean. Do not take baths, swim, or use a hot tub until your health care provider approves. This information is not intended to replace advice given to you by your health care provider. Make sure you discuss any questions you have with your health care provider. Document Revised: 02/21/2021 Document Reviewed: 02/21/2021 Elsevier Patient Education  2024 Elsevier Inc.     Edwina Barth, MD [facility]

## 2023-08-20 NOTE — Assessment & Plan Note (Signed)
Well-healed without infection or complications Staples removed without difficulty or complication

## 2023-08-20 NOTE — Patient Instructions (Signed)
Suture Removal, Care After The following information offers guidance on how to care for yourself after your procedure. Your health care provider may also give you more specific instructions. If you have problems or questions, contact your health care provider. What can I expect after the procedure? After your stitches (sutures) are removed, it is common to have: Some discomfort and swelling in the area. Slight redness in the area. Follow these instructions at home: If you have a dressing: Wash your hands with soap and water for at least 20 seconds before and after you change your bandage (dressing). If soap and water are not available, use hand sanitizer. Change your dressing as told by your health care provider. If your dressing becomes wet or dirty, or develops a bad smell, change it as soon as possible. If your dressing sticks to your skin, pour warm, clean water over it until it loosens and can be removed without pulling apart the wound edges. Pat the area dry with a soft, clean towel. Do not rub the wound because that may cause bleeding. Wound care  Check your wound every day for signs of infection. Check for: More redness, swelling, or pain. Fluid or blood. New warmth, a rash, or hardness at the wound site. Pus or a bad smell. Wash your hands with soap and water for at least 20 seconds before and after touching your wound. If soap and water are not available, use hand sanitizer. Keep the wound area dry and clean. Clean and pat the wound dry as told by your health care provider. Apply cream or ointment only as told by your health care provider. If skin glue or adhesive strips were applied after sutures were removed, leave these closures in place. They may need to stay in place for 2 weeks or longer. If adhesive strip edges start to loosen and curl up, you may trim the loose edges. Do not remove adhesive strips completely unless your health care provider tells you to do that. Continue to  protect the wound from injury. Do not pick at your wound. Picking can cause an infection. Bathing Do not take baths, swim, or use a hot tub until your health care provider approves. Ask your health care provider if you may take showers. Follow these steps for showering: If you have a dressing, remove it before getting into the shower. In the shower, allow soapy water to get on the wound. Avoid scrubbing the wound. When you get out of the shower, dry the wound by patting it with a clean towel. Reapply a dressing over the wound, if needed. Scar care When your wound has completely healed, help decrease the size of your scar by: Wearing sunscreen over the scar or covering it with clothing when you are outside. New scars get sunburned easily, which can make scarring worse. Gently massaging the scarred area. This can decrease scar thickness. General instructions Take over-the-counter and prescription medicines only as told by your health care provider. Keep all follow-up visits. This is important. Contact a health care provider if: You have more redness, swelling, or pain around your wound. You have fluid or blood coming from your wound. You have new warmth, a rash, or hardness at the wound site. You have pus or a bad smell coming from your wound. Your wound opens up. Get help right away if: You have a fever or chills. You have red streaks coming from your wound. Summary After your sutures are removed, it is common to have some discomfort  and swelling in the area. Wash your hands with soap and water before you change your bandage (dressing). Keep the wound area dry and clean. Do not take baths, swim, or use a hot tub until your health care provider approves. This information is not intended to replace advice given to you by your health care provider. Make sure you discuss any questions you have with your health care provider. Document Revised: 02/21/2021 Document Reviewed:  02/21/2021 Elsevier Patient Education  2024 [employer].

## 2023-08-26 ENCOUNTER — Telehealth: Payer: Self-pay | Admitting: Emergency Medicine

## 2023-08-26 NOTE — Telephone Encounter (Signed)
Hi, Mr. Honeycutt brought in his handicap form to be completed since we missed to fill out a little portion.  I have placed it in Dr. Latrelle Dodrill box @ the front desk.   Please call him when is ready to be picked up.  Thanks!  Rossana.

## 2023-08-27 NOTE — Telephone Encounter (Signed)
Andrew [name] called patient to pick up form. He will come by the office to pick up

## 2023-09-03 DIAGNOSIS — H52223 Regular astigmatism, bilateral: Secondary | ICD-10-CM | POA: Diagnosis not present

## 2023-09-03 DIAGNOSIS — H524 Presbyopia: Secondary | ICD-10-CM | POA: Diagnosis not present

## 2023-09-17 ENCOUNTER — Other Ambulatory Visit: Payer: Self-pay | Admitting: Emergency Medicine

## 2023-09-17 DIAGNOSIS — J301 Allergic rhinitis due to pollen: Secondary | ICD-10-CM

## 2023-09-27 ENCOUNTER — Ambulatory Visit (HOSPITAL_BASED_OUTPATIENT_CLINIC_OR_DEPARTMENT_OTHER)
Admission: RE | Admit: 2023-09-27 | Discharge: 2023-09-27 | Disposition: A | Discharge status: Home / Self Care | Payer: Medicare HMO | Source: Ambulatory Visit | Attending: Emergency Medicine | Admitting: Emergency Medicine

## 2023-09-27 DIAGNOSIS — Z87891 Personal history of nicotine dependence: Secondary | ICD-10-CM | POA: Insufficient documentation

## 2023-09-27 DIAGNOSIS — Z122 Encounter for screening for malignant neoplasm of respiratory organs: Secondary | ICD-10-CM | POA: Insufficient documentation

## 2023-09-27 DIAGNOSIS — F17210 Nicotine dependence, cigarettes, uncomplicated: Secondary | ICD-10-CM | POA: Diagnosis not present

## 2023-10-01 DIAGNOSIS — I129 Hypertensive chronic kidney disease with stage 1 through stage 4 chronic kidney disease, or unspecified chronic kidney disease: Secondary | ICD-10-CM | POA: Diagnosis not present

## 2023-10-01 DIAGNOSIS — R7303 Prediabetes: Secondary | ICD-10-CM | POA: Diagnosis not present

## 2023-10-01 DIAGNOSIS — J449 Chronic obstructive pulmonary disease, unspecified: Secondary | ICD-10-CM | POA: Diagnosis not present

## 2023-10-01 DIAGNOSIS — N179 Acute kidney failure, unspecified: Secondary | ICD-10-CM | POA: Diagnosis not present

## 2023-10-01 DIAGNOSIS — N1831 Chronic kidney disease, stage 3a: Secondary | ICD-10-CM | POA: Diagnosis not present

## 2023-10-01 DIAGNOSIS — Z720 Tobacco use: Secondary | ICD-10-CM | POA: Diagnosis not present

## 2023-10-01 DIAGNOSIS — E785 Hyperlipidemia, unspecified: Secondary | ICD-10-CM | POA: Diagnosis not present

## 2023-10-01 DIAGNOSIS — C61 Malignant neoplasm of prostate: Secondary | ICD-10-CM | POA: Diagnosis not present

## 2023-10-01 DIAGNOSIS — R361 Hematospermia: Secondary | ICD-10-CM | POA: Diagnosis not present

## 2023-10-02 LAB — LAB REPORT - SCANNED
A1c: 5.8
Albumin, Urine POC: 150.2
Creatinine, POC: 285.6 mg/dL
EGFR: 48
Microalb Creat Ratio: 53

## 2023-10-09 ENCOUNTER — Encounter: Payer: Self-pay | Admitting: Nephrology

## 2023-10-24 DIAGNOSIS — H5203 Hypermetropia, bilateral: Secondary | ICD-10-CM | POA: Diagnosis not present

## 2023-10-25 ENCOUNTER — Other Ambulatory Visit: Payer: Self-pay

## 2023-10-25 DIAGNOSIS — Z122 Encounter for screening for malignant neoplasm of respiratory organs: Secondary | ICD-10-CM

## 2023-10-25 DIAGNOSIS — F17210 Nicotine dependence, cigarettes, uncomplicated: Secondary | ICD-10-CM

## 2023-10-25 DIAGNOSIS — Z87891 Personal history of nicotine dependence: Secondary | ICD-10-CM

## 2023-11-10 ENCOUNTER — Other Ambulatory Visit: Payer: Self-pay | Admitting: Emergency Medicine

## 2023-11-10 DIAGNOSIS — I10 Essential (primary) hypertension: Secondary | ICD-10-CM

## 2023-11-15 ENCOUNTER — Other Ambulatory Visit: Payer: Medicare HMO

## 2023-11-15 DIAGNOSIS — C61 Malignant neoplasm of prostate: Secondary | ICD-10-CM

## 2023-11-16 LAB — PSA: Prostate Specific Ag, Serum: 1 ng/mL (ref 0.0–4.0)

## 2023-11-22 ENCOUNTER — Ambulatory Visit: Payer: Medicare HMO | Admitting: Urology

## 2023-11-22 VITALS — BP 123/64 | HR 112 | Ht 73.0 in | Wt 185.0 lb

## 2023-11-22 DIAGNOSIS — C61 Malignant neoplasm of prostate: Secondary | ICD-10-CM | POA: Diagnosis not present

## 2023-11-22 DIAGNOSIS — N401 Enlarged prostate with lower urinary tract symptoms: Secondary | ICD-10-CM | POA: Diagnosis not present

## 2023-11-22 DIAGNOSIS — R351 Nocturia: Secondary | ICD-10-CM | POA: Diagnosis not present

## 2023-11-22 DIAGNOSIS — N5201 Erectile dysfunction due to arterial insufficiency: Secondary | ICD-10-CM | POA: Diagnosis not present

## 2023-11-22 DIAGNOSIS — N138 Other obstructive and reflux uropathy: Secondary | ICD-10-CM

## 2023-11-22 LAB — URINALYSIS, ROUTINE W REFLEX MICROSCOPIC
Bilirubin, UA: NEGATIVE
Glucose, UA: NEGATIVE
Ketones, UA: NEGATIVE
Leukocytes,UA: NEGATIVE
Nitrite, UA: NEGATIVE
Specific Gravity, UA: 1.025 (ref 1.005–1.030)
Urobilinogen, Ur: 0.2 mg/dL (ref 0.2–1.0)
pH, UA: 6 (ref 5.0–7.5)

## 2023-11-22 LAB — MICROSCOPIC EXAMINATION
Bacteria, UA: NONE SEEN
WBC, UA: NONE SEEN /[HPF] (ref 0–5)

## 2023-11-22 MED ORDER — SOLIFENACIN SUCCINATE 5 MG PO TABS
5.0000 mg | ORAL_TABLET | Freq: Every day | ORAL | 3 refills | Status: DC
Start: 2023-11-22 — End: 2024-05-22

## 2023-11-22 MED ORDER — TAMSULOSIN HCL 0.4 MG PO CAPS
0.4000 mg | ORAL_CAPSULE | Freq: Two times a day (BID) | ORAL | 11 refills | Status: DC
Start: 2023-11-22 — End: 2024-05-22

## 2023-11-22 MED ORDER — TADALAFIL 20 MG PO TABS: 20.0000 mg | ORAL_TABLET | Freq: Every day | ORAL | 3 refills | Status: DC | PRN

## 2023-11-22 NOTE — Progress Notes (Signed)
 11/22/2023 9:36 AM   Andrew [name] 02/15/1951 [id number]  Referring provider: Purcell Emil Schanz, [name] [address] [place],  [place] [address]  Followup prostate cancer   HPI: [name] is a 73yo here for followup for prostate cancer, BPH, and ED. PSA decreased to 1.0 from 2.1 IPSS 5 QOL 2 on flomax  0.4mg  BID and vesicare  5mg  daily. Nocturia 0-2x. Urine stream strong. No straining to urinate. Tadalafil  works well for his erectile dysfunction.    PMH: Past Medical History:  Diagnosis Date   Allergic rhinitis    Anal lesion    polypoid   Atherosclerosis of aorta (HCC)    Carotid artery stenosis, asymptomatic, bilateral 11/2017   last carotid ultrasound in epic 07-19-2021  right ECA >50% and left ICA 40-59% stenosis   CKD (chronic kidney disease), stage III (HCC)    followed by pcp;  previously followed by nephrologist--- Andrew [name]  (per pt lov appprox 2020)   DOE (dyspnea on exertion)    10-27-2021  pt stated has normal doe due to COPD, but past couple days it has even more so, Andrew [name]  [name]   ED (erectile dysfunction)    GERD (gastroesophageal reflux disease)    History of adenomatous polyp of colon    Hyperplasia of prostate with lower urinary tract symptoms (LUTS)    Hypertension    followed by pcp;   nuclear stress test 07-13-2021 low risk no ischemia w/ nuclear ef 62%;  echo 07-13-2021 ef 55-60%, aortic valve w/ mild AR with mild to moderate sclerosis, RVSP 36.45mmHg   Peripheral vascular disease (HCC)    Pre-diabetes    Prostate cancer [facility]) 2018   urologist-- Andrew [name];  first dx 02/ 2018 Gleason 3+3, PSA 6.4 active survillance;  MRI fusion bx 04-21-2021, Gleason 3+4, PSA 20.5   Renal cyst, acquired    bilateral   Stage 2 moderate COPD by GOLD classification [facility])    pulmonologist--- Andrew [name];  emphysema still smoking ;  pt does not have oxygen (10-27-2021  pt stated trying to quit last cigarette in 7 days, had cut done to 3-4 cig per  week)   Wears dentures    full upper and partial lower   Wears glasses     Surgical History: Past Surgical History:  Procedure Laterality Date   COLONOSCOPY WITH PROPOFOL   03/28/2021   by Andrew Andrew   GOLD SEED IMPLANT N/A 11/30/2021   Procedure: GOLD SEED IMPLANT;  Surgeon: Andrew [name];  Location: [facility];  Service: Urology;  Laterality: N/A;   NO PAST SURGERIES     SPACE OAR INSTILLATION N/A 11/30/2021   Procedure: SPACE OAR INSTILLATION;  Surgeon: Andrew [name];  Location: [facility];  Service: Urology;  Laterality: N/A;   TRANSANAL EXCISION OF RECTAL MASS N/A 10/30/2021   Procedure: TRANSANAL EXCISION OF ANAL CANAL POLYPOID LESION;  Surgeon: Andrew [name];  Location: [facility];  Service: General;  Laterality: N/A;    Home Medications:  Allergies as of 11/22/2023       Reactions   Lisinopril -hydrochlorothiazide     dizziness        Medication List        Accurate as of November 22, 2023  9:36 AM. If you have any questions, ask your nurse or doctor.          STOP taking these medications    aspirin  EC 81 MG tablet  TAKE these medications    acetaminophen  500 MG tablet Commonly known as: TYLENOL  Take 1,000 mg by mouth every 6 (six) hours as needed for moderate pain.   albuterol  108 (90 Base) MCG/ACT inhaler Commonly known as: VENTOLIN  HFA Inhale 2 puffs into the lungs every 4 (four) hours as needed for wheezing or shortness of breath (cough, shortness of breath or wheezing.).   cetirizine  10 MG tablet Commonly known as: ZYRTEC  TAKE 1 TABLET EVERY DAY   diltiazem  240 MG 24 hr capsule Commonly known as: DILACOR XR  TAKE 1 CAPSULE EVERY DAY   fluticasone  50 MCG/ACT nasal spray Commonly known as: FLONASE  Place 1 spray into both nostrils daily.   mirabegron  ER 25 MG Tb24 tablet Commonly known as: MYRBETRIQ  Take 1 tablet (25 mg total) by mouth daily.   olmesartan  40 MG tablet Commonly known as:  BENICAR  TAKE 1 TABLET EVERY DAY   omeprazole 20 MG tablet Commonly known as: PRILOSEC OTC Take 20 mg by mouth daily as needed (acid reflux).   rosuvastatin  20 MG tablet Commonly known as: CRESTOR  Take 1 tablet (20 mg total) by mouth daily.   sildenafil  100 MG tablet Commonly known as: VIAGRA  Take 1 tablet (100 mg total) by mouth daily as needed for erectile dysfunction.   silodosin  8 MG Caps capsule Commonly known as: RAPAFLO  Take 1 capsule (8 mg total) by mouth at bedtime.   solifenacin  5 MG tablet Commonly known as: VESICARE  Take 1 tablet (5 mg total) by mouth daily.   Spiriva  Respimat 2.5 MCG/ACT Aers Generic drug: Tiotropium Bromide Monohydrate  Inhale 2 puffs into the lungs daily.   Symbicort  160-4.5 MCG/ACT inhaler Generic drug: budesonide -formoterol  INHALE 2 PUFFS FIRST THING IN THE MORNING AND THEN 2 PUFFS ABOUT 12 HOURS LATER AS DIRECTED   tadalafil  20 MG tablet Commonly known as: CIALIS  Take 1 tablet (20 mg total) by mouth daily as needed for erectile dysfunction.   tamsulosin  0.4 MG Caps capsule Commonly known as: FLOMAX  Take 1 capsule (0.4 mg total) by mouth in the morning and at bedtime.   traMADol  50 MG tablet Commonly known as: ULTRAM  Take 1 tablet (50 mg total) by mouth every 6 (six) hours as needed.   triamcinolone  cream 0.1 % Commonly known as: KENALOG  APPLY TO THE AFFECTED AREA(S) TOPICALLY TWICE DAILY        Allergies:  Allergies  Allergen Reactions   Lisinopril -Hydrochlorothiazide      dizziness    Family History: Family History  Problem Relation Age of Onset   Colon cancer Neg Hx    Esophageal cancer Neg Hx    Rectal cancer Neg Hx    Stomach cancer Neg Hx    Colon polyps Neg Hx     Social History:  reports that he has been smoking cigarettes. He has never used smokeless tobacco. He reports that he does not currently use alcohol. He reports that he does not currently use drugs after having used the following drugs: Marijuana and  Cocaine.  ROS: All other review of systems were reviewed and are negative except what is noted above in HPI  Physical Exam: BP 123/64   Pulse (!) 112   Ht 6' 1 (1.854 m)   Wt 185 lb (83.9 kg)   BMI 24.41 kg/m   Constitutional:  Alert and oriented, No acute distress. HEENT: [place] AT, moist mucus membranes.  Trachea midline, no masses. Cardiovascular: No clubbing, cyanosis, or edema. Respiratory: Normal respiratory effort, no increased work of breathing. GI: Abdomen is soft, nontender, nondistended,  no abdominal masses GU: No CVA tenderness.  Lymph: No cervical or inguinal lymphadenopathy. Skin: No rashes, bruises or suspicious lesions. Neurologic: Grossly intact, no focal deficits, moving all 4 extremities. Psychiatric: Normal mood and affect.  Laboratory Data: Lab Results  Component Value Date   WBC 7.7 08/07/2023   HGB 14.3 08/07/2023   HCT 43.1 08/07/2023   MCV 87.1 08/07/2023   PLT 244 08/07/2023    Lab Results  Component Value Date   CREATININE 1.49 08/13/2023    Lab Results  Component Value Date   PSA 5.4 (H) 09/14/2016   PSA 5.58 (H) 01/24/2016    No results found for: TESTOSTERONE  Lab Results  Component Value Date   HGBA1C 5.6 10/02/2019    Urinalysis    Component Value Date/Time   APPEARANCEUR Clear 05/15/2023 1002   GLUCOSEU Negative 05/15/2023 1002   BILIRUBINUR Negative 05/15/2023 1002   KETONESUR negative 10/17/2017 1615   PROTEINUR Trace 05/15/2023 1002   UROBILINOGEN 0.2 10/17/2017 1615   NITRITE Negative 05/15/2023 1002   LEUKOCYTESUR Negative 05/15/2023 1002    Lab Results  Component Value Date   LABMICR See below: 05/15/2023   WBCUA 0-5 05/15/2023   LABEPIT 0-10 05/15/2023   MUCUS Present 06/09/2021   BACTERIA None seen 05/15/2023    Pertinent Imaging:  No results found for this or any previous visit.  No results found for this or any previous visit.  No results found for this or any previous visit.  No results found  for this or any previous visit.  Results for orders placed during the hospital encounter of 05/27/20  US  RENAL  Narrative CLINICAL DATA:  CKD stage 3  EXAM: RENAL / URINARY TRACT ULTRASOUND COMPLETE  COMPARISON:  Chest CT acquired on September 25, 2019, limited assessment of the kidneys at that time.  FINDINGS: Right Kidney:  Renal measurements: 10.7 x 4.0 x 4.5 cm = volume: 102 mL. Small hypoechoic to anechoic focus in the upper pole of the RIGHT kidney shows some central increased echogenicity and perhaps multi septate changes.  3.9 x 3.3 x 2.7 cm anechoic cyst with adjacent smaller cystic area 1.2 x 1.3 x 1.3 cm cannot be classified as a simple cyst and does not show overt signs of flow. No hydronephrosis. Decreased corticomedullary differentiation.  Left Kidney:  Renal measurements: 10.6 x 5.7 x 4.8 cm = volume: 151 mL. Decreased corticomedullary differentiation without hydronephrosis. Small mildly complex areas with multi septate or spongiform appearance in the upper pole and interpolar portion largest measuring 1.6 x 1.4 x 1.5 cm with smaller areas, less than 10 also showing some cystic characteristics.  Bladder:  Appears normal for degree of bladder distention.  Other:  None.  IMPRESSION: 1. Decreased corticomedullary differentiation without hydronephrosis. Findings can be seen in the setting of medical renal disease. 2. Multiple cystic lesions in the bilateral kidneys without renal enlargement. Some of these areas display internal complexity/variable echogenicity. MRI may be helpful for further evaluation to exclude the presence of enhancing components/small cystic renal neoplasm.  These results will be called to the ordering clinician or representative by the Radiologist Assistant, and communication documented in the PACS or [employer].   Electronically Signed By: Isla Blind M.D. On: 05/30/2020 10:55  No results found for this or any  previous visit.  No results found for this or any previous visit.  No results found for this or any previous visit.   Assessment & Plan:    1. Prostate cancer (HCC) (Primary)  Followup 6 months with PSA - Urinalysis, Routine w reflex microscopic  2. Benign prostatic hyperplasia with urinary obstruction -contiue flomax  0.4mg  daily  3. Nocturia Continue vesicare  5mg  daily  4. Erectile dysfunction due to arterial insufficiency Continue tadalafil  20mg  prn   No follow-ups on file.  Belvie Clara, [name]  [facility] [place]

## 2023-11-26 ENCOUNTER — Other Ambulatory Visit: Payer: Self-pay | Admitting: Cardiology

## 2023-11-26 ENCOUNTER — Other Ambulatory Visit: Payer: Self-pay | Admitting: Emergency Medicine

## 2023-11-26 DIAGNOSIS — I700 Atherosclerosis of aorta: Secondary | ICD-10-CM

## 2023-11-27 ENCOUNTER — Other Ambulatory Visit: Payer: Self-pay | Admitting: Internal Medicine

## 2023-11-27 NOTE — Telephone Encounter (Signed)
 Allowing 1 refill this time until pt is seen in the office. Pt needs an appointment

## 2023-11-28 ENCOUNTER — Encounter: Payer: Self-pay | Admitting: Urology

## 2023-11-28 NOTE — Patient Instructions (Signed)

## 2023-12-03 ENCOUNTER — Ambulatory Visit: Payer: Medicare HMO | Admitting: Internal Medicine

## 2023-12-03 ENCOUNTER — Encounter: Payer: Self-pay | Admitting: Internal Medicine

## 2023-12-03 ENCOUNTER — Ambulatory Visit: Payer: Medicare HMO

## 2023-12-03 VITALS — BP 128/72 | HR 70 | Temp 98.2°F | Ht 73.0 in | Wt 186.4 lb

## 2023-12-03 DIAGNOSIS — J449 Chronic obstructive pulmonary disease, unspecified: Secondary | ICD-10-CM

## 2023-12-03 DIAGNOSIS — R0602 Shortness of breath: Secondary | ICD-10-CM | POA: Diagnosis not present

## 2023-12-03 DIAGNOSIS — F17210 Nicotine dependence, cigarettes, uncomplicated: Secondary | ICD-10-CM | POA: Diagnosis not present

## 2023-12-03 MED ORDER — BREZTRI AEROSPHERE 160-9-4.8 MCG/ACT IN AERO: INHALATION_SPRAY | RESPIRATORY_TRACT | 11 refills | Status: AC

## 2023-12-03 MED ORDER — BREZTRI AEROSPHERE 160-9-4.8 MCG/ACT IN AERO: 2.0000 | INHALATION_SPRAY | Freq: Two times a day (BID) | RESPIRATORY_TRACT | 11 refills | Status: DC

## 2023-12-03 MED ORDER — METHYLPREDNISOLONE ACETATE 80 MG/ML IJ SUSP
120.0000 mg | Freq: Once | INTRAMUSCULAR | Status: AC
  Administered 2023-12-03: 120 mg via INTRAMUSCULAR

## 2023-12-03 MED ORDER — ALBUTEROL SULFATE (2.5 MG/3ML) 0.083% IN NEBU: 2.5000 mg | INHALATION_SOLUTION | RESPIRATORY_TRACT | 12 refills | Status: AC | PRN

## 2023-12-03 MED ORDER — BREZTRI AEROSPHERE 160-9-4.8 MCG/ACT IN AERO: 2.0000 | INHALATION_SPRAY | Freq: Two times a day (BID) | RESPIRATORY_TRACT | Status: DC

## 2023-12-03 MED ORDER — ALBUTEROL SULFATE HFA 108 (90 BASE) MCG/ACT IN AERS: INHALATION_SPRAY | RESPIRATORY_TRACT | 1 refills | Status: AC

## 2023-12-03 NOTE — Progress Notes (Unsigned)
Subjective:    Patient ID: Andrew [name], male   DOB: 1951-04-10,    MRN: [id number]   Brief patient profile:  38  yobm active smoker referred to pulmonary clinic 09/20/2017 by Dr   [name] for copd eval with GOLD II criteria documented 12/09/17    History of Present Illness  09/20/2017 1st [facility] office visit/ [name]   Chief Complaint  Patient presents with   Pulmonary Consult    Referred by Dr. [name] for eval of COPD. Pt states his breathing is currently at baseline. He uses an albuterol inhaler 2-3 x per wk and has atrovent and albuterol nebs that he rarely uses.    exac 07/21/17 did not req admit with rec stating on Breo 200 denies taking too expensive so started symb ? Strength p er rx but costs 300 per month  Baseline doe now on rx = best days Not limited by breathing from desired activities lots of fast pace walking at [employer]/ some hills ok  rec Plan A = Automatic = symbiocort 160=dulera 200 Take 2 puffs first thing in am and then another 2 puffs about 12 hours later.  Work on inhaler technique:  relax and gently blow all the way out then take a nice smooth deep breath back in, triggering the inhaler at same time you start breathing in.  Hold for up to 5 seconds if you can. Blow out thru nose. Rinse and gargle with water when done Plan B = Backup Only use your albuterol as a rescue medication Plan C = Crisis - only use your albuterol nebulizer if you first try Plan B    09/08/2021  f/u ov/[facility] re: copd GOLD 2 / still moking some   maint on symbicort 2bid insurance would not cover breztri   Chief Complaint  Patient presents with   Follow-up    Breathing is overall doing well and no new co's. He has been out of [place] for a few months. He uses his albuterol inhaler about 3 x per wk.    Dyspnea:  MMRC2 = can't walk a nl pace on a flat grade s sob but does fine slow and flat eg walking large stores / slt better on breztri vs advair but ca't afford  the former and advair is free  Cough: none x in ams > clear only  Sleeping: flat bed 3 pillows s resp cc  SABA use: rarely  02: none  Rec No change rx     09/10/2022  f/u ov/[name] re: GOLD 2 copd maint on symbicort 160 2bid  / better breathing on breztri/ still smoking  Chief Complaint  Patient presents with   Follow-up    No concerns.   Dyspnea:  does fine at [employer] nl pace  Cough: occ in am white  Sleeping: flat bed 2 pillows no problem  SABA use: rarely  02: none  Covid status:   vax x 3  Lung cancer screening :  in program but missed 10/17/21   Rec Plan A = Automatic = Always=    Breztri   Take 2 puffs first thing in am and then another 2 puffs about 12 hours later.   Plan B = Backup (to supplement plan A, not to replace it) Only use your albuterol inhaler as a rescue medication  Plan C = Crisis (instead of Plan B but only if Plan B stops working) - only use your albuterol nebulizer if you first try Plan B  12/03/2023  f/u ov/[name] office/[name] re: GOLD 3  maint on symbicort  alone and not consistent even with it.   Chief Complaint  Patient presents with   Acute Visit    SOB with exertion x 2 weeks.  Some cough, wheeze and chest congestion/mucus.  Dyspnea:  doe across the room  Cough: mucus is white, lots of sneezing  Sleeping: worse cough/ wheeze   resp cc  SABA use: did not know ABC plan above  02: none   Lung cancer screening: completed  09/27/23    No obvious day to day or daytime variability or assoc  purulent sputum or mucus plugs or hemoptysis or cp or chest tightness, subjective wheeze or overt sinus or hb symptoms.    Also denies any obvious fluctuation of symptoms with weather or environmental changes or other aggravating or alleviating factors except as outlined above   No unusual exposure hx or h/o childhood pna/ asthma or knowledge of premature birth.  Current Allergies, Complete Past Medical History, Past Surgical History, Family History, and  Social History were reviewed in [employer] record.  ROS  The following are not active complaints unless bolded Hoarseness, sore throat, dysphagia, dental problems, itching, sneezing,  nasal congestion or discharge of excess mucus or purulent secretions, ear ache,   fever, chills, sweats, unintended wt loss or wt gain, classically pleuritic or exertional cp,  orthopnea pnd or arm/hand swelling  or leg swelling, presyncope, palpitations, abdominal pain, anorexia, nausea, vomiting, diarrhea  or change in bowel habits or change in bladder habits, change in stools or change in urine, dysuria, hematuria,  rash, arthralgias, visual complaints, headache, numbness, weakness or ataxia or problems with walking or coordination,  change in mood or  memory.        Current Meds  Medication Sig   acetaminophen (TYLENOL) 500 MG tablet Take 1,000 mg by mouth every 6 (six) hours as needed for moderate pain.   albuterol (PROVENTIL HFA;VENTOLIN HFA) 108 (90 Base) MCG/ACT inhaler Inhale 2 puffs into the lungs every 4 (four) hours as needed for wheezing or shortness of breath (cough, shortness of breath or wheezing.).   budesonide-formoterol (SYMBICORT) 160-4.5 MCG/ACT inhaler INHALE 2 PUFFS FIRST THING IN THE MORNING AND THEN 2 PUFFS ABOUT 12 HOURS LATER AS DIRECTED   cetirizine (ZYRTEC) 10 MG tablet TAKE 1 TABLET EVERY DAY   diltiazem (DILACOR XR) 240 MG 24 hr capsule TAKE 1 CAPSULE EVERY DAY   fluticasone (FLONASE) 50 MCG/ACT nasal spray Place 1 spray into both nostrils daily.   olmesartan (BENICAR) 40 MG tablet TAKE 1 TABLET EVERY DAY   omeprazole (PRILOSEC OTC) 20 MG tablet Take 20 mg by mouth daily as needed (acid reflux).   rosuvastatin (CRESTOR) 20 MG tablet Take 1 tablet (20 mg total) by mouth daily.   sildenafil (VIAGRA) 100 MG tablet Take 1 tablet (100 mg total) by mouth daily as needed for erectile dysfunction.   silodosin (RAPAFLO) 8 MG CAPS capsule Take 1 capsule (8 mg total) by  mouth at bedtime.   solifenacin (VESICARE) 5 MG tablet Take 1 tablet (5 mg total) by mouth daily.   tadalafil (CIALIS) 20 MG tablet Take 1 tablet (20 mg total) by mouth daily as needed for erectile dysfunction.   tamsulosin (FLOMAX) 0.4 MG CAPS capsule Take 1 capsule (0.4 mg total) by mouth in the morning and at bedtime.   traMADol (ULTRAM) 50 MG tablet Take 1 tablet (50 mg total) by mouth every 6 (six) hours as  needed.   triamcinolone cream (KENALOG) 0.1 % APPLY TO THE AFFECTED AREA(S) TOPICALLY TWICE DAILY                  Objective:   Physical Exam   Wts  12/03/2023        186  09/10/2022      193  09/08/2021     198  12/01/2020       201 09/06/2020     200  06/16/2019        194  07/08/2018       204  12/09/2017       207   09/20/17 202 lb (91.6 kg)  09/19/17 202 lb (91.6 kg)  09/03/17 201 lb 3.2 oz (91.3 kg)    Vital signs reviewed  12/03/2023  - Note at rest 02 sats  97% on RA   General appearance:    chronically ill congested cough / min rhonchi        HEENT : Oropharynx  upper denture/ lower partial   Nasal turbinates nl    NECK :  without  apparent JVD/ palpable Nodes/TM    LUNGS: no acc muscle use,  Mild barrel  contour chest wall with bilateral  Distant bs s audible wheeze and  without cough on insp or exp maneuvers  and mild  Hyperresonant  to  percussion bilaterally     CV:  RRR  no s3 or murmur or increase in P2, and no edema   ABD:  soft and nontender with pos end  insp Hoover's  in the supine position.  No bruits or organomegaly appreciated   MS:  Nl gait/ ext warm without deformities Or obvious joint restrictions  calf tenderness, cyanosis or clubbing     SKIN: warm and dry without lesions    NEURO:  alert, approp, nl sensorium with  no motor or cerebellar deficits apparent.    CXR PA and Lateral:   12/03/2023 :    I personally reviewed images and impression is as follows:     Mod copd pattern, no acute findings     Assessment:

## 2023-12-03 NOTE — Patient Instructions (Addendum)
Plan A = Automatic = Always=    Breztri Take 2 puffs first thing in am and then another 2 puffs about 12 hours later.   Work on inhaler technique:  relax and gently blow all the way out then take a nice smooth full deep breath back in, triggering the inhaler at same time you start breathing in.  Hold breath in for at least  5 seconds if you can. Blow out breztri thru nose. Rinse and gargle with water when done.  If mouth or throat bother you at all,  try brushing teeth/gums/tongue with arm and hammer toothpaste/ make a slurry and gargle and spit out.      Plan B = Backup (to supplement plan A, not to replace it) Only use your albuterol inhaler as a rescue medication to be used if you can't catch your breath by resting or doing a relaxed purse lip breathing pattern.  - The less you use it, the better it will work when you need it. - Ok to use the inhaler up to 2 puffs  every 4 hours if you must but call for appointment if use goes up over your usual need - Don't leave home without it !!  (think of it like the spare tire for your car)   Plan C = Crisis (instead of Plan B but only if Plan B stops working) - only use your albuterol nebulizer if you first try Plan B and it fails to help > ok to use the nebulizer up to every 4 hours but if start needing it regularly call for immediate appointment  Depomedrol 120 IM now  When cough/ wheeze especially at night > omeprzole 20 mg Take 30- 60 min before your first and last meals of the day   The key is to stop smoking completely before smoking completely stops you!     Please remember to go to the  x-ray department  for your tests - we will call you with the results when they are available     Please schedule a follow up visit in 3 months but call sooner if needed  with all medications /inhalers/ solutions in hand so we can verify exactly what you are taking. This includes all medications from all doctors and over the counters

## 2023-12-03 NOTE — Assessment & Plan Note (Addendum)
Active smoker  07/19/17 CT Mild centrilobular emphysematous changes, upper lobe predominant. - Spirometry 09/20/2017  FEV1 1.45 (42%)  Ratio 50 with typical curvature > dulera 200 (humana) 2bid   -  PFT's  12/09/2017  FEV1 2.38 (72 % ) ratio 60  p 1 % improvement from saba p symbicort 160 prior to study with DLCO  62/66 % corrects to 71  % for alv volume   - 12/09/2017   continue dulera 200 or symb 160 depending on insurance  06/16/2019  After extensive coaching inhaler device,  effectiveness =   90% hfa  - PFT's   03/03/21  FEV1 2.0 (62 % ) ratio 0.58  p 0 % improvement from saba p breztri prior to study with DLCO  21.20 (75%) corrects to 3.30 (81%)  for alv volume and FV curve classic mild concavity   - 09/10/2022  After extensive coaching inhaler device,  effectiveness =    90% with SMI > added sprivia 2.5 to symb 160   >>>>  12/03/2023  After extensive coaching inhaler device,  effectiveness =    75% hfa > try breztri 2bid    Group D (now reclassified as E) in terms of symptom/risk and laba/lama/ICS  therefore appropriate rx at this point >>>  breztri and approp saba           Each maintenance medication was reviewed in detail including emphasizing most importantly the difference between maintenance and prns and under what circumstances the prns are to be triggered using an action plan format where appropriate.  Total time for H and P, chart review, counseling, reviewing hfa /neb  device(s) and generating customized AVS unique to this office visit / same day charting = 20 min

## 2023-12-03 NOTE — Assessment & Plan Note (Signed)

## 2024-01-03 ENCOUNTER — Telehealth: Payer: Self-pay | Admitting: Internal Medicine

## 2024-01-03 NOTE — Telephone Encounter (Signed)
Patient requesting Breztri samples, please advise.

## 2024-01-03 NOTE — Telephone Encounter (Signed)
Patient dropped off Breztri assistance forms- placed in Dr. Thurston Hole box to be filled out and signed.

## 2024-01-10 MED ORDER — BREZTRI AEROSPHERE 160-9-4.8 MCG/ACT IN AERO: 2.0000 | INHALATION_SPRAY | Freq: Two times a day (BID) | RESPIRATORY_TRACT | Status: DC

## 2024-01-10 NOTE — Telephone Encounter (Signed)
 Spoke with the pt  2 samples of Breztri at front for pick up Awaiting approval for pt assistance  Nothing further needed

## 2024-01-23 ENCOUNTER — Other Ambulatory Visit: Payer: Self-pay | Admitting: Emergency Medicine

## 2024-01-23 DIAGNOSIS — I10 Essential (primary) hypertension: Secondary | ICD-10-CM

## 2024-01-30 DIAGNOSIS — I129 Hypertensive chronic kidney disease with stage 1 through stage 4 chronic kidney disease, or unspecified chronic kidney disease: Secondary | ICD-10-CM | POA: Diagnosis not present

## 2024-01-30 DIAGNOSIS — R361 Hematospermia: Secondary | ICD-10-CM | POA: Diagnosis not present

## 2024-01-30 DIAGNOSIS — J449 Chronic obstructive pulmonary disease, unspecified: Secondary | ICD-10-CM | POA: Diagnosis not present

## 2024-01-30 DIAGNOSIS — R7303 Prediabetes: Secondary | ICD-10-CM | POA: Diagnosis not present

## 2024-01-30 DIAGNOSIS — N179 Acute kidney failure, unspecified: Secondary | ICD-10-CM | POA: Diagnosis not present

## 2024-01-30 DIAGNOSIS — C61 Malignant neoplasm of prostate: Secondary | ICD-10-CM | POA: Diagnosis not present

## 2024-01-30 DIAGNOSIS — Z720 Tobacco use: Secondary | ICD-10-CM | POA: Diagnosis not present

## 2024-01-30 DIAGNOSIS — E785 Hyperlipidemia, unspecified: Secondary | ICD-10-CM | POA: Diagnosis not present

## 2024-01-30 DIAGNOSIS — N1831 Chronic kidney disease, stage 3a: Secondary | ICD-10-CM | POA: Diagnosis not present

## 2024-02-03 ENCOUNTER — Other Ambulatory Visit: Payer: Self-pay

## 2024-02-06 ENCOUNTER — Encounter: Payer: Self-pay | Admitting: Nephrology

## 2024-02-07 ENCOUNTER — Telehealth: Payer: Self-pay | Admitting: *Deleted

## 2024-02-07 NOTE — Telephone Encounter (Signed)
 Copied from CRM [id number]. Topic: General - Other >> Feb 07, 2024  9:52 AM Isabell A wrote: Reason for CRM: Patient states [employer] Forms received the form but it was unsigned, calling to check when this will be faxed over to the company - requesting a call back.   Callback number: [phone]

## 2024-02-10 ENCOUNTER — Encounter: Payer: Self-pay | Admitting: *Deleted

## 2024-02-10 ENCOUNTER — Other Ambulatory Visit: Payer: Self-pay

## 2024-02-10 NOTE — Telephone Encounter (Signed)
 I located the [place] forms that were indeed signed by pt and DrWert under the media tab  I have refaxed [place] to [place] [employer] [place]  I have sent the pt msg via mychart to let him know this was done.

## 2024-03-20 ENCOUNTER — Telehealth: Payer: Self-pay | Admitting: Emergency Medicine

## 2024-03-20 NOTE — Telephone Encounter (Signed)
 Copied from CRM [id number]. Topic: Clinical - Medication Question >> Mar 20, 2024  2:50 PM Dyann Glaser G wrote: Reason for CRM: PT CALLED STATED HIS HANDICAP STICKER IS EXPIRED AND STATED HE NEEDS ANOTHER FORM AND HE IS GOING OUT OF TOWN MONDAY. HE CAN BE REACHED AT [phone]

## 2024-03-25 NOTE — Telephone Encounter (Signed)
 Form filled out for provider to sign.

## 2024-04-07 ENCOUNTER — Ambulatory Visit (INDEPENDENT_AMBULATORY_CARE_PROVIDER_SITE_OTHER): Payer: Medicare HMO

## 2024-04-07 VITALS — Ht 73.5 in | Wt 190.0 lb

## 2024-04-07 DIAGNOSIS — Z0000 Encounter for general adult medical examination without abnormal findings: Secondary | ICD-10-CM

## 2024-04-07 DIAGNOSIS — F17200 Nicotine dependence, unspecified, uncomplicated: Secondary | ICD-10-CM

## 2024-04-07 DIAGNOSIS — Z122 Encounter for screening for malignant neoplasm of respiratory organs: Secondary | ICD-10-CM

## 2024-04-07 DIAGNOSIS — Z1211 Encounter for screening for malignant neoplasm of colon: Secondary | ICD-10-CM

## 2024-04-07 NOTE — Patient Instructions (Signed)
 Andrew [name] , Thank you for taking time out of your busy schedule to complete your Annual Wellness Visit with me. I enjoyed our conversation and look forward to speaking with you again next year. I, as well as your care team,  appreciate your ongoing commitment to your health goals. Please review the following plan we discussed and let me know if I can assist you in the future. Your Game plan/ To Do List    Referrals: If you haven't heard from the office you've been referred to, please reach out to them at the phone provided.  Referral for a Cologuard test; Referral for a Lung Cancer Screening test Follow up Visits: Next Medicare AWV with our clinical staff: 04/08/2025   Have you seen your provider in the last 6 months (3 months if uncontrolled diabetes)? No Next Office Visit with your provider: 04/08/2024  Clinician Recommendations:  Aim for 30 minutes of exercise or brisk walking, 6-8 glasses of water, and 5 servings of fruits and vegetables each day. Educated and advised on getting the Shingles and COVID vaccines in 2025      This is a list of the screening recommended for you and due dates:  Health Maintenance  Topic Date Due   Zoster (Shingles) Vaccine (1 of 2) Never done   Cologuard (Stool DNA test)  Never done   COVID-19 Vaccine (3 - Pfizer risk series) 02/05/2020   Flu Shot  06/12/2024   Screening for Lung Cancer  09/26/2024   Medicare Annual Wellness Visit  04/07/2025   DTaP/Tdap/Td vaccine (3 - Td or Tdap) 08/06/2033   Pneumonia Vaccine  Completed   Hepatitis C Screening  Completed   HPV Vaccine  Aged Out   Meningitis B Vaccine  Aged Out    Advanced directives: (Provided) Advance directive discussed with you today. I have provided a copy for you to complete at home and have notarized. Once this is complete, please bring a copy in to our office so we can scan it into your chart.  Advance Care Planning is important because it:  [x]  Makes sure you receive the medical care that  is consistent with your values, goals, and preferences  [x]  It provides guidance to your family and loved ones and reduces their decisional burden about whether or not they are making the right decisions based on your wishes.  Follow the link provided in your after visit summary or read over the paperwork we have mailed to you to help you started getting your Advance Directives in place. If you need assistance in completing these, please reach out to us  so that we can help you!

## 2024-04-07 NOTE — Progress Notes (Signed)
 Subjective:   Andrew [name] is a 73 y.o. who presents for a Medicare Wellness preventive visit.  As a reminder, Annual Wellness Visits don't include a physical exam, and some assessments may be limited, especially if this visit is performed virtually. We may recommend an in-person follow-up visit with your provider if needed.  Visit Complete: Virtual I connected with  Andrew [name] on 04/07/24 by a audio enabled telemedicine application and verified that I am speaking with the correct person using two identifiers.  Patient Location: Home  Provider Location: Office/Clinic  I discussed the limitations of evaluation and management by telemedicine. The patient expressed understanding and agreed to proceed.  Vital Signs: Because this visit was a virtual/telehealth visit, some criteria may be missing or patient reported. Any vitals not documented were not able to be obtained and vitals that have been documented are patient reported.  VideoDeclined- This patient declined Librarian, academic. Therefore the visit was completed with audio only.  Persons Participating in Visit: Patient.  AWV Questionnaire: Yes: Patient Medicare AWV questionnaire was completed by the patient on 04/06/2024; I have confirmed that all information answered by patient is correct and no changes since this date.  Cardiac Risk Factors include: advanced age (>51men, >85 women);male gender;hypertension;dyslipidemia;smoking/ tobacco exposure (current smoker)     Objective:     Today's Vitals   04/07/24 1342  Weight: 190 lb (86.2 kg)  Height: 6' 1.5" (1.867 m)   Body mass index is 24.73 kg/m.     04/07/2024    1:40 PM 04/03/2023    2:05 PM 03/26/2022    3:43 PM 02/27/2022    2:41 PM 11/30/2021    9:24 AM 11/30/2021    9:16 AM 11/28/2021   11:10 AM  Advanced Directives  Does Patient Have a Medical Advance Directive? No No No No  No No  Would patient like information on creating a  medical advance directive? No - Patient declined No - Patient declined No - Patient declined  No - Patient declined No - Patient declined No - Patient declined    Current Medications (verified) Outpatient Encounter Medications as of 04/07/2024  Medication Sig   acetaminophen  (TYLENOL ) 500 MG tablet Take 1,000 mg by mouth every 6 (six) hours as needed for moderate pain.   albuterol  (PROAIR  HFA) 108 (90 Base) MCG/ACT inhaler 2 puffs every 4 hours as needed only  if your can't catch your breath   albuterol  (PROVENTIL  HFA;VENTOLIN  HFA) 108 (90 Base) MCG/ACT inhaler Inhale 2 puffs into the lungs every 4 (four) hours as needed for wheezing or shortness of breath (cough, shortness of breath or wheezing.).   albuterol  (PROVENTIL ) (2.5 MG/3ML) 0.083% nebulizer solution Take 3 mLs (2.5 mg total) by nebulization every 4 (four) hours as needed.   Budeson-Glycopyrrol-Formoterol  (BREZTRI  AEROSPHERE) 160-9-4.8 MCG/ACT AERO Take 2 puffs first thing in am and then another 2 puffs about 12 hours later.   cetirizine  (ZYRTEC ) 10 MG tablet TAKE 1 TABLET EVERY DAY   DILT-XR 240 MG 24 hr capsule TAKE 1 CAPSULE EVERY DAY   fluticasone  (FLONASE ) 50 MCG/ACT nasal spray Place 1 spray into both nostrils daily.   olmesartan  (BENICAR ) 40 MG tablet TAKE 1 TABLET EVERY DAY   omeprazole (PRILOSEC OTC) 20 MG tablet Take 20 mg by mouth daily as needed (acid reflux).   rosuvastatin  (CRESTOR ) 20 MG tablet Take 1 tablet (20 mg total) by mouth daily.   sildenafil  (VIAGRA ) 100 MG tablet Take 1 tablet (100 mg total)  by mouth daily as needed for erectile dysfunction.   silodosin  (RAPAFLO ) 8 MG CAPS capsule Take 1 capsule (8 mg total) by mouth at bedtime.   solifenacin  (VESICARE ) 5 MG tablet Take 1 tablet (5 mg total) by mouth daily.   tadalafil  (CIALIS ) 20 MG tablet Take 1 tablet (20 mg total) by mouth daily as needed for erectile dysfunction.   tamsulosin  (FLOMAX ) 0.4 MG CAPS capsule Take 1 capsule (0.4 mg total) by mouth in the morning  and at bedtime.   triamcinolone  cream (KENALOG ) 0.1 % APPLY TO THE AFFECTED AREA(S) TOPICALLY TWICE DAILY   [DISCONTINUED] traMADol  (ULTRAM ) 50 MG tablet Take 1 tablet (50 mg total) by mouth every 6 (six) hours as needed.   No facility-administered encounter medications on file as of 04/07/2024.    Allergies (verified) Lisinopril -hydrochlorothiazide    History: Past Medical History:  Diagnosis Date   Allergic rhinitis    Anal lesion    polypoid   Atherosclerosis of aorta (HCC)    Carotid artery stenosis, asymptomatic, bilateral 11/2017   last carotid ultrasound in epic 07-19-2021  right ECA >50% and left ICA 40-59% stenosis   CKD (chronic kidney disease), stage III (HCC)    followed by pcp;  previously followed by nephrologist--- dr Ambrose Junk  (per pt lov appprox 2020)   DOE (dyspnea on exertion)    10-27-2021  pt stated has normal doe due to COPD, but past couple days it has even more so, Dr Waymond Hailey started pt on prednisone  today   ED (erectile dysfunction)    GERD (gastroesophageal reflux disease)    History of adenomatous polyp of colon    Hyperplasia of prostate with lower urinary tract symptoms (LUTS)    Hypertension    followed by pcp;   nuclear stress test 07-13-2021 low risk no ischemia w/ nuclear ef 62%;  echo 07-13-2021 ef 55-60%, aortic valve w/ mild AR with mild to moderate sclerosis, RVSP 36.8mmHg   Peripheral vascular disease (HCC)    Pre-diabetes    Prostate cancer [facility]) 2018   urologist-- dr Claretta Croft;  first dx 02/ 2018 Gleason 3+3, PSA 6.4 active survillance;  MRI fusion bx 04-21-2021, Gleason 3+4, PSA 20.5   Renal cyst, acquired    bilateral   Stage 2 moderate COPD by GOLD classification [facility])    pulmonologist--- dr Waymond Hailey;  emphysema still smoking ;  pt does not have oxygen (10-27-2021  pt stated trying to quit last cigarette in 7 days, had cut done to 3-4 cig per week)   Wears dentures    full upper and partial lower   Wears glasses    Past Surgical History:   Procedure Laterality Date   COLONOSCOPY WITH PROPOFOL   03/28/2021   by dr Howard Macho   GOLD SEED IMPLANT N/A 11/30/2021   Procedure: GOLD SEED IMPLANT;  Surgeon: Marco Severs, MD;  Location: [facility];  Service: Urology;  Laterality: N/A;   NO PAST SURGERIES     SPACE OAR INSTILLATION N/A 11/30/2021   Procedure: SPACE OAR INSTILLATION;  Surgeon: Marco Severs, MD;  Location: [facility];  Service: Urology;  Laterality: N/A;   TRANSANAL EXCISION OF RECTAL MASS N/A 10/30/2021   Procedure: TRANSANAL EXCISION OF ANAL CANAL POLYPOID LESION;  Surgeon: Melvenia Stabs, MD;  Location: [facility];  Service: General;  Laterality: N/A;   Family History  Problem Relation Age of Onset   Colon cancer Neg Hx    Esophageal cancer Neg Hx    Rectal cancer Neg Hx  Stomach cancer Neg Hx    Colon polyps Neg Hx    Social History   Socioeconomic History   Marital status: Married    Spouse name: Not on file   Number of children: 1   Years of education: Not on file   Highest education level: Some college, no degree  Occupational History   Not on file  Tobacco Use   Smoking status: Some Days    Current packs/day: 1.00    Average packs/day: 1 pack/day for 54.4 years (54.4 ttl pk-yrs)    Types: Cigarettes    Start date: 11/12/1969    Passive exposure: Current   Smokeless tobacco: Never   Tobacco comments:    10-27-2021  pt stated trying to quit, last cigarette 7 days ago,  had gotten down to 3-4 cig per week (pt started smoking age 89)  Vaping Use   Vaping status: Never Used  Substance and Sexual Activity   Alcohol use: Not Currently    Alcohol/week: 1.0 standard drink of alcohol    Types: 1 Cans of beer per week    Comment: occasional   Drug use: Not Currently    Comment: 10-27-2021  pt stated last cocine age 89s and last marijuana age early 54s   Sexual activity: Yes  Other Topics Concern   Not on file  Social History Narrative   Not on file   Social Drivers of  Health   Financial Resource Strain: Low Risk  (04/07/2024)   Overall Financial Resource Strain (CARDIA)    Difficulty of Paying Living Expenses: Not hard at all  Food Insecurity: No Food Insecurity (04/07/2024)   Hunger Vital Sign    Worried About Running Out of Food in the Last Year: Never true    Ran Out of Food in the Last Year: Never true  Transportation Needs: No Transportation Needs (04/07/2024)   PRAPARE - Administrator, Civil Service (Medical): No    Lack of Transportation (Non-Medical): No  Physical Activity: Insufficiently Active (04/07/2024)   Exercise Vital Sign    Days of Exercise per Week: 2 days    Minutes of Exercise per Session: 20 min  Stress: No Stress Concern Present (04/07/2024)   [employer] of Occupational Health - Occupational Stress Questionnaire    Feeling of Stress : Only a little  Social Connections: Socially Integrated (04/07/2024)   Social Connection and Isolation Panel [NHANES]    Frequency of Communication with Friends and Family: More than three times a week    Frequency of Social Gatherings with Friends and Family: Twice a week    Attends Religious Services: 1 to 4 times per year    Active Member of [employer] or Organizations: Yes    Attends Engineer, structural: More than 4 times per year    Marital Status: Married    Tobacco Counseling Ready to quit: No Counseling given: Yes Tobacco comments: 10-27-2021  pt stated trying to quit, last cigarette 7 days ago,  had gotten down to 3-4 cig per week (pt started smoking age 23)    Clinical Intake:  Pre-visit preparation completed: Yes  Pain : No/denies pain     BMI - recorded: 24.73 Nutritional Status: BMI of 19-24  Normal Nutritional Risks: None Diabetes: No  Lab Results  Component Value Date   HGBA1C 5.6 10/02/2019   HGBA1C 5.8 (H) 03/31/2019   HGBA1C 5.7 (H) 09/16/2018     How often do you need to have someone help you when  you read instructions, pamphlets, or  other written materials from your doctor or pharmacy?: 1 - Never  Interpreter Needed?: No  Information entered by :: Kandy Orris, CMA   Activities of Daily Living     04/07/2024    1:46 PM 04/06/2024    4:01 PM  In your present state of health, do you have any difficulty performing the following activities:  Hearing? 0 0  Vision? 0 0  Difficulty concentrating or making decisions? 0 0  Walking or climbing stairs? 0 0  Dressing or bathing? 0 0  Doing errands, shopping? 0 0  Preparing Food and eating ? N N  Using the Toilet? N N  In the past six months, have you accidently leaked urine? Y Y  Comment seldom   Do you have problems with loss of bowel control? N N  Managing your Medications? N N  Managing your Finances? N N  Housekeeping or managing your Housekeeping? N N    Patient Care Team: Elvira Hammersmith, MD as PCP - General (Internal Medicine) McKenzie, Arden Beck, MD as Consulting Physician (Urology) Cody Das, MD as Consulting Physician (Cardiology) Diamond Formica, MD as Consulting Physician (Pulmonary Disease) Melvenia Stabs, MD as Consulting Physician (General Surgery) Kenith Payer, MD as Consulting Physician (Radiation Oncology) Debbie Fails, Laura Polio, NP as Nurse Practitioner (Hematology and Oncology) Neda Balk, RN as Registered Nurse [facility] as Consulting Physician (Optometry) Dema Filler, MD as Consulting Physician (Ophthalmology)  Indicate any recent Medical Services you may have received from other than [facility] providers in the past year (date may be approximate).     Assessment:    This is a routine wellness examination for Andrew [name].  Hearing/Vision screen Hearing Screening - Comments:: Denies hearing difficulties   Vision Screening - Comments:: Wears rx glasses - up to date with routine eye exams with Dr Juanito Norma   Goals Addressed               This Visit's Progress     Patient Stated (pt-stated)         Patient stated he has been exercising more and watching diet       Depression Screen     04/07/2024    1:50 PM 08/20/2023    2:46 PM 08/13/2023    2:18 PM 06/03/2023    3:31 PM 04/03/2023    2:06 PM 03/22/2023    9:21 AM 03/04/2023    4:14 PM  PHQ 2/9 Scores  PHQ - 2 Score 0 0 0 0 0 0 0  PHQ- 9 Score 0    0      Fall Risk     04/07/2024    1:49 PM 04/06/2024    4:01 PM 08/20/2023    2:46 PM 08/13/2023    2:18 PM 06/03/2023    3:31 PM  Fall Risk   Falls in the past year? 1 1 1 1  0  Number falls in past yr: 0 0 0 0 0  Comment 1      Injury with Fall? 1 1 1 1  0  Comment head injury - staples      Risk for fall due to :   Impaired balance/gait Impaired balance/gait No Fall Risks  Follow up Falls evaluation completed;Falls prevention discussed  Falls evaluation completed Falls evaluation completed Falls evaluation completed    MEDICARE RISK AT HOME:  Medicare Risk at Home Any stairs in or around the home?: Yes If so, are there any  without handrails?: No Home free of loose throw rugs in walkways, pet beds, electrical cords, etc?: Yes Adequate lighting in your home to reduce risk of falls?: Yes Life alert?: No Use of a cane, walker or w/c?: Yes (cane/walker) Grab bars in the bathroom?: No Shower chair or bench in shower?: No Elevated toilet seat or a handicapped toilet?: No  TIMED UP AND GO:  Was the test performed?  No  Cognitive Function: 6CIT completed        04/07/2024    1:53 PM 04/03/2023    2:23 PM 10/11/2020    2:04 PM 10/02/2019    9:28 AM 09/02/2017    3:18 PM  6CIT Screen  What Year? 0 points 0 points 0 points 0 points 0 points  What month? 0 points 0 points 0 points 0 points 0 points  What time? 0 points 0 points 0 points 0 points 0 points  Count back from 20 0 points 0 points 0 points 0 points 0 points  Months in reverse 0 points 0 points 0 points 0 points 0 points  Repeat phrase 0 points 0 points 0 points 0 points 0 points  Total Score 0 points 0  points 0 points 0 points 0 points    Immunizations Immunization History  Administered Date(s) Administered   Fluad Quad(high Dose 65+) 09/06/2020, 08/23/2021, 08/02/2022   Influenza,inj,Quad PF,6+ Mos 01/24/2016, 09/02/2017   PFIZER(Purple Top)SARS-COV-2 Vaccination 12/18/2019, 01/08/2020   PNEUMOCOCCAL CONJUGATE-20 04/20/2021   Pneumococcal Conjugate-13 09/02/2017   Pneumococcal Polysaccharide-23 03/29/2020   Tdap 03/29/2020, 08/07/2023    Screening Tests Health Maintenance  Topic Date Due   Zoster Vaccines- Shingrix (1 of 2) Never done   Fecal DNA (Cologuard)  Never done   COVID-19 Vaccine (3 - Pfizer risk series) 02/05/2020   INFLUENZA VACCINE  06/12/2024   Lung Cancer Screening  09/26/2024   Medicare Annual Wellness (AWV)  04/07/2025   DTaP/Tdap/Td (3 - Td or Tdap) 08/06/2033   Pneumonia Vaccine 17+ Years old  Completed   Hepatitis C Screening  Completed   HPV VACCINES  Aged Out   Meningococcal B Vaccine  Aged Out    Health Maintenance  Health Maintenance Due  Topic Date Due   Zoster Vaccines- Shingrix (1 of 2) Never done   Fecal DNA (Cologuard)  Never done   COVID-19 Vaccine (3 - Pfizer risk series) 02/05/2020   Health Maintenance Items Addressed:  Cologuard Ordered, Lung Cancer Screening ordered  Additional Screening:  Vision Screening: Recommended annual ophthalmology exams for early detection of glaucoma and other disorders of the eye.  Dental Screening: Recommended annual dental exams for proper oral hygiene  Community Resource Referral / Chronic Care Management: CRR required this visit?  No   CCM required this visit?  No   Plan:    I have personally reviewed and noted the following in the patient's chart:   Medical and social history Use of alcohol, tobacco or illicit drugs  Current medications and supplements including opioid prescriptions. Patient is not currently taking opioid prescriptions. Functional ability and status Nutritional  status Physical activity Advanced directives List of other physicians Hospitalizations, surgeries, and ER visits in previous 12 months Vitals Screenings to include cognitive, depression, and falls Referrals and appointments  In addition, I have reviewed and discussed with patient certain preventive protocols, quality metrics, and best practice recommendations. A written personalized care plan for preventive services as well as general preventive health recommendations were provided to patient.   Patria Bookbinder, CMA  04/07/2024   After Visit Summary: (MyChart) Due to this being a telephonic visit, the after visit summary with patients personalized plan was offered to patient via MyChart   Notes: Nothing significant to report at this time.

## 2024-04-08 ENCOUNTER — Encounter: Payer: Self-pay | Admitting: Emergency Medicine

## 2024-04-08 ENCOUNTER — Ambulatory Visit (INDEPENDENT_AMBULATORY_CARE_PROVIDER_SITE_OTHER): Admitting: Emergency Medicine

## 2024-04-08 ENCOUNTER — Ambulatory Visit: Payer: Self-pay | Admitting: Emergency Medicine

## 2024-04-08 VITALS — BP 158/68 | HR 71 | Temp 98.4°F | Ht 73.5 in | Wt 196.0 lb

## 2024-04-08 DIAGNOSIS — Z130 Encounter for screening for diseases of the blood and blood-forming organs and certain disorders involving the immune mechanism: Secondary | ICD-10-CM

## 2024-04-08 DIAGNOSIS — N1832 Chronic kidney disease, stage 3b: Secondary | ICD-10-CM | POA: Diagnosis not present

## 2024-04-08 DIAGNOSIS — C61 Malignant neoplasm of prostate: Secondary | ICD-10-CM

## 2024-04-08 DIAGNOSIS — E785 Hyperlipidemia, unspecified: Secondary | ICD-10-CM | POA: Diagnosis not present

## 2024-04-08 DIAGNOSIS — F17210 Nicotine dependence, cigarettes, uncomplicated: Secondary | ICD-10-CM

## 2024-04-08 DIAGNOSIS — I2510 Atherosclerotic heart disease of native coronary artery without angina pectoris: Secondary | ICD-10-CM | POA: Diagnosis not present

## 2024-04-08 DIAGNOSIS — I700 Atherosclerosis of aorta: Secondary | ICD-10-CM

## 2024-04-08 DIAGNOSIS — Z0001 Encounter for general adult medical examination with abnormal findings: Secondary | ICD-10-CM | POA: Diagnosis not present

## 2024-04-08 DIAGNOSIS — Z13228 Encounter for screening for other metabolic disorders: Secondary | ICD-10-CM | POA: Diagnosis not present

## 2024-04-08 DIAGNOSIS — R7303 Prediabetes: Secondary | ICD-10-CM | POA: Diagnosis not present

## 2024-04-08 DIAGNOSIS — J449 Chronic obstructive pulmonary disease, unspecified: Secondary | ICD-10-CM

## 2024-04-08 DIAGNOSIS — Z1329 Encounter for screening for other suspected endocrine disorder: Secondary | ICD-10-CM

## 2024-04-08 DIAGNOSIS — I10 Essential (primary) hypertension: Secondary | ICD-10-CM

## 2024-04-08 LAB — CBC WITH DIFFERENTIAL/PLATELET
Basophils Absolute: 0 10*3/uL (ref 0.0–0.1)
Basophils Relative: 0.2 % (ref 0.0–3.0)
Eosinophils Absolute: 0.2 10*3/uL (ref 0.0–0.7)
Eosinophils Relative: 2.7 % (ref 0.0–5.0)
HCT: 41.8 % (ref 39.0–52.0)
Hemoglobin: 13.9 g/dL (ref 13.0–17.0)
Lymphocytes Relative: 15.6 % (ref 12.0–46.0)
Lymphs Abs: 1 10*3/uL (ref 0.7–4.0)
MCHC: 33.1 g/dL (ref 30.0–36.0)
MCV: 85.9 fl (ref 78.0–100.0)
Monocytes Absolute: 0.5 10*3/uL (ref 0.1–1.0)
Monocytes Relative: 8 % (ref 3.0–12.0)
Neutro Abs: 4.7 10*3/uL (ref 1.4–7.7)
Neutrophils Relative %: 73.5 % (ref 43.0–77.0)
Platelets: 227 10*3/uL (ref 150.0–400.0)
RBC: 4.87 Mil/uL (ref 4.22–5.81)
RDW: 13.4 % (ref 11.5–15.5)
WBC: 6.4 10*3/uL (ref 4.0–10.5)

## 2024-04-08 LAB — LIPID PANEL
Cholesterol: 171 mg/dL (ref 0–200)
HDL: 63 mg/dL (ref >39.00)
LDL Cholesterol: 95 mg/dL (ref 0–99)
NonHDL: 107.79
Total CHOL/HDL Ratio: 3
Triglycerides: 62 mg/dL (ref 0.0–149.0)
VLDL: 12.4 mg/dL (ref 0.0–40.0)

## 2024-04-08 LAB — COMPREHENSIVE METABOLIC PANEL WITH GFR
ALT: 26 U/L (ref 0–53)
AST: 20 U/L (ref 0–37)
Albumin: 4.3 g/dL (ref 3.5–5.2)
Alkaline Phosphatase: 138 U/L — ABNORMAL HIGH (ref 39–117)
BUN: 14 mg/dL (ref 6–23)
CO2: 30 meq/L (ref 19–32)
Calcium: 9.5 mg/dL (ref 8.4–10.5)
Chloride: 103 meq/L (ref 96–112)
Creatinine, Ser: 1.41 mg/dL (ref 0.40–1.50)
GFR: 49.56 mL/min — ABNORMAL LOW (ref >60.00)
Glucose, Bld: 99 mg/dL (ref 70–99)
Potassium: 3.8 meq/L (ref 3.5–5.1)
Sodium: 141 meq/L (ref 135–145)
Total Bilirubin: 0.7 mg/dL (ref 0.2–1.2)
Total Protein: 6.7 g/dL (ref 6.0–8.3)

## 2024-04-08 LAB — PSA: PSA: 0.7 ng/mL (ref 0.10–4.00)

## 2024-04-08 LAB — HEMOGLOBIN A1C: Hgb A1c MFr Bld: 6 % (ref 4.6–6.5)

## 2024-04-08 MED ORDER — VARENICLINE TARTRATE 0.5 MG PO TABS: 0.5000 mg | ORAL_TABLET | Freq: Two times a day (BID) | ORAL | 3 refills | Status: DC

## 2024-04-08 NOTE — Patient Instructions (Signed)
 Health Maintenance, Male  Adopting a healthy lifestyle and getting preventive care are important in promoting health and wellness. Ask your health care provider about:  The right schedule for you to have regular tests and exams.  Things you can do on your own to prevent diseases and keep yourself healthy.  What should I know about diet, weight, and exercise?  Eat a healthy diet    Eat a diet that includes plenty of vegetables, fruits, low-fat dairy products, and lean protein.  Do not eat a lot of foods that are high in solid fats, added sugars, or sodium.  Maintain a healthy weight  Body mass index (BMI) is a measurement that can be used to identify possible weight problems. It estimates body fat based on height and weight. Your health care provider can help determine your BMI and help you achieve or maintain a healthy weight.  Get regular exercise  Get regular exercise. This is one of the most important things you can do for your health. Most adults should:  Exercise for at least 150 minutes each week. The exercise should increase your heart rate and make you sweat (moderate-intensity exercise).  Do strengthening exercises at least twice a week. This is in addition to the moderate-intensity exercise.  Spend less time sitting. Even light physical activity can be beneficial.  Watch cholesterol and blood lipids  Have your blood tested for lipids and cholesterol at 73 years of age, then have this test every 5 years.  You may need to have your cholesterol levels checked more often if:  Your lipid or cholesterol levels are high.  You are older than 73 years of age.  You are at high risk for heart disease.  What should I know about cancer screening?  Many types of cancers can be detected early and may often be prevented. Depending on your health history and family history, you may need to have cancer screening at various ages. This may include screening for:  Colorectal cancer.  Prostate cancer.  Skin cancer.  Lung  cancer.  What should I know about heart disease, diabetes, and high blood pressure?  Blood pressure and heart disease  High blood pressure causes heart disease and increases the risk of stroke. This is more likely to develop in people who have high blood pressure readings or are overweight.  Talk with your health care provider about your target blood pressure readings.  Have your blood pressure checked:  Every 3-5 years if you are 9-95 years of age.  Every year if you are 85 years old or older.  If you are between the ages of 29 and 29 and are a current or former smoker, ask your health care provider if you should have a one-time screening for abdominal aortic aneurysm (AAA).  Diabetes  Have regular diabetes screenings. This checks your fasting blood sugar level. Have the screening done:  Once every three years after age 23 if you are at a normal weight and have a low risk for diabetes.  More often and at a younger age if you are overweight or have a high risk for diabetes.  What should I know about preventing infection?  Hepatitis B  If you have a higher risk for hepatitis B, you should be screened for this virus. Talk with your health care provider to find out if you are at risk for hepatitis B infection.  Hepatitis C  Blood testing is recommended for:  Everyone born from [birth year] through 1965.  Anyone  with known risk factors for hepatitis C.  Sexually transmitted infections (STIs)  You should be screened each year for STIs, including gonorrhea and chlamydia, if:  You are sexually active and are younger than 73 years of age.  You are older than 73 years of age and your health care provider tells you that you are at risk for this type of infection.  Your sexual activity has changed since you were last screened, and you are at increased risk for chlamydia or gonorrhea. Ask your health care provider if you are at risk.  Ask your health care provider about whether you are at high risk for HIV. Your health care provider  may recommend a prescription medicine to help prevent HIV infection. If you choose to take medicine to prevent HIV, you should first get tested for HIV. You should then be tested every 3 months for as long as you are taking the medicine.  Follow these instructions at home:  Alcohol use  Do not drink alcohol if your health care provider tells you not to drink.  If you drink alcohol:  Limit how much you have to 0-2 drinks a day.  Know how much alcohol is in your drink. In the [place] one drink equals one 12 oz bottle of beer (355 mL), one 5 oz glass of wine (148 mL), or one 1 oz glass of hard liquor (44 mL).  Lifestyle  Do not use any products that contain nicotine or tobacco. These products include cigarettes, chewing tobacco, and vaping devices, such as e-cigarettes. If you need help quitting, ask your health care provider.  Do not use street drugs.  Do not share needles.  Ask your health care provider for help if you need support or information about quitting drugs.  General instructions  Schedule regular health, dental, and eye exams.  Stay current with your vaccines.  Tell your health care provider if:  You often feel depressed.  You have ever been abused or do not feel safe at home.  Summary  Adopting a healthy lifestyle and getting preventive care are important in promoting health and wellness.  Follow your health care provider's instructions about healthy diet, exercising, and getting tested or screened for diseases.  Follow your health care provider's instructions on monitoring your cholesterol and blood pressure.  This information is not intended to replace advice given to you by your health care provider. Make sure you discuss any questions you have with your health care provider.  Document Revised: 03/20/2021 Document Reviewed: 03/20/2021  Elsevier Patient Education  2024 [employer].

## 2024-04-08 NOTE — Assessment & Plan Note (Signed)
 Motivated to quit. Recommend to start Chantix 

## 2024-04-08 NOTE — Assessment & Plan Note (Signed)
Stable.  No recent anginal episodes.

## 2024-04-08 NOTE — Assessment & Plan Note (Signed)
 Was able to follow-up with nephrologist Has appointment this summer for follow-up Most recent BMP shows worsening GFR Advised to stay well-hydrated and avoid NSAIDs

## 2024-04-08 NOTE — Progress Notes (Signed)
 Andrew [name] 73 y.o.   Chief Complaint  Patient presents with   Annual Exam    Patient here for physical, also here wanted a new handicap placard. Also here to talk about wanting to quit smoking     HISTORY OF PRESENT ILLNESS: This is a 73 y.o. male here for annual exam and follow-up on chronic medical conditions Wants to quit smoking.  Interested in Chantix . No other complaints or medical concerns today.  HPI   Prior to Admission medications   Medication Sig Start Date End Date Taking? Authorizing Provider  acetaminophen  (TYLENOL ) 500 MG tablet Take 1,000 mg by mouth every 6 (six) hours as needed for moderate pain.   Yes [provider]  albuterol  (PROAIR  HFA) 108 (90 Base) MCG/ACT inhaler 2 puffs every 4 hours as needed only  if your can't catch your breath 12/03/23  Yes Diamond Formica, MD  albuterol  (PROVENTIL  HFA;VENTOLIN  HFA) 108 (90 Base) MCG/ACT inhaler Inhale 2 puffs into the lungs every 4 (four) hours as needed for wheezing or shortness of breath (cough, shortness of breath or wheezing.). 06/29/17  Yes Cranston Dk, MD  albuterol  (PROVENTIL ) (2.5 MG/3ML) 0.083% nebulizer solution Take 3 mLs (2.5 mg total) by nebulization every 4 (four) hours as needed. 12/03/23  Yes Diamond Formica, MD  Budeson-Glycopyrrol-Formoterol  (BREZTRI  AEROSPHERE) 160-9-4.8 MCG/ACT AERO Take 2 puffs first thing in am and then another 2 puffs about 12 hours later. 12/03/23  Yes Diamond Formica, MD  cetirizine  (ZYRTEC ) 10 MG tablet TAKE 1 TABLET EVERY DAY 09/17/23  Yes Amelie Caracci Jose, MD  DILT-XR 240 MG 24 hr capsule TAKE 1 CAPSULE EVERY DAY 01/23/24  Yes Debar Plate, Isidro Margo, MD  fluticasone  (FLONASE ) 50 MCG/ACT nasal spray Place 1 spray into both nostrils daily. 05/11/21  Yes Diamond Formica, MD  olmesartan  (BENICAR ) 40 MG tablet TAKE 1 TABLET EVERY DAY 11/11/23  Yes Priyansh Pry, Isidro Margo, MD  omeprazole (PRILOSEC OTC) 20 MG tablet Take 20 mg by mouth daily as needed (acid reflux).   Yes  [provider]  rosuvastatin  (CRESTOR ) 20 MG tablet Take 1 tablet (20 mg total) by mouth daily. 08/13/23  Yes Rhaya Coale, Isidro Margo, MD  sildenafil  (VIAGRA ) 100 MG tablet Take 1 tablet (100 mg total) by mouth daily as needed for erectile dysfunction. 05/15/23  Yes McKenzie, Arden Beck, MD  silodosin  (RAPAFLO ) 8 MG CAPS capsule Take 1 capsule (8 mg total) by mouth at bedtime. 11/16/22  Yes McKenzie, Arden Beck, MD  solifenacin  (VESICARE ) 5 MG tablet Take 1 tablet (5 mg total) by mouth daily. 11/22/23  Yes McKenzie, Arden Beck, MD  tadalafil  (CIALIS ) 20 MG tablet Take 1 tablet (20 mg total) by mouth daily as needed for erectile dysfunction. 11/22/23  Yes McKenzie, Arden Beck, MD  tamsulosin  (FLOMAX ) 0.4 MG CAPS capsule Take 1 capsule (0.4 mg total) by mouth in the morning and at bedtime. 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   Cervical radiculitis 08/23/2021   Atherosclerosis of aorta (HCC) 04/20/2021   Acquired thrombophilia (HCC) 05/05/2020   Prostate cancer (HCC) 01/14/2018   Carotid artery stenosis, asymptomatic, bilateral 12/06/2017  Vitamin D  deficiency 12/03/2017   Prediabetes 11/14/2017   Stage 3b chronic kidney disease (HCC) 11/13/2017   Cigarette smoker 08/20/2017   COPD GOLD 2 still smoking  07/31/2017   Coronary atherosclerosis of native coronary artery 07/31/2017   Benign prostatic hyperplasia with urinary obstruction 05/30/2016   Allergic rhinitis 06/01/2008   DENTAL CARIES 06/01/2008   GERD 03/04/2008   Hyperlipidemia LDL goal <70 01/30/2008   Essential hypertension 01/30/2008   PERIPHERAL VASCULAR DISEASE 01/30/2008    COCAINE ABUSE 01/30/2008    Past Medical History:  Diagnosis Date   Allergic rhinitis    Anal lesion    polypoid   Atherosclerosis of aorta (HCC)    Carotid artery stenosis, asymptomatic, bilateral 11/2017   last carotid ultrasound in epic 07-19-2021  right ECA >50% and left ICA 40-59% stenosis   CKD (chronic kidney disease), stage III (HCC)    followed by pcp;  previously followed by nephrologist--- dr Ambrose Junk  (per pt lov appprox 2020)   DOE (dyspnea on exertion)    10-27-2021  pt stated has normal doe due to COPD, but past couple days it has even more so, Dr Waymond Hailey started pt on prednisone  today   ED (erectile dysfunction)    GERD (gastroesophageal reflux disease)    History of adenomatous polyp of colon    Hyperplasia of prostate with lower urinary tract symptoms (LUTS)    Hypertension    followed by pcp;   nuclear stress test 07-13-2021 low risk no ischemia w/ nuclear ef 62%;  echo 07-13-2021 ef 55-60%, aortic valve w/ mild AR with mild to moderate sclerosis, RVSP 36.44mmHg   Peripheral vascular disease (HCC)    Pre-diabetes    Prostate cancer [facility]) 2018   urologist-- dr Claretta Croft;  first dx 02/ 2018 Gleason 3+3, PSA 6.4 active survillance;  MRI fusion bx 04-21-2021, Gleason 3+4, PSA 20.5   Renal cyst, acquired    bilateral   Stage 2 moderate COPD by GOLD classification [facility])    pulmonologist--- dr Waymond Hailey;  emphysema still smoking ;  pt does not have oxygen (10-27-2021  pt stated trying to quit last cigarette in 7 days, had cut done to 3-4 cig per week)   Wears dentures    full upper and partial lower   Wears glasses     Past Surgical History:  Procedure Laterality Date   COLONOSCOPY WITH PROPOFOL   03/28/2021   by dr Howard Macho   GOLD SEED IMPLANT N/A 11/30/2021   Procedure: GOLD SEED IMPLANT;  Surgeon: Marco Severs, MD;  Location: [facility];  Service: Urology;  Laterality: N/A;   NO PAST SURGERIES     SPACE OAR INSTILLATION N/A 11/30/2021   Procedure: SPACE OAR  INSTILLATION;  Surgeon: Marco Severs, MD;  Location: [facility];  Service: Urology;  Laterality: N/A;   TRANSANAL EXCISION OF RECTAL MASS N/A 10/30/2021   Procedure: TRANSANAL EXCISION OF ANAL CANAL POLYPOID LESION;  Surgeon: Melvenia Stabs, MD;  Location: [facility];  Service: General;  Laterality: N/A;    Social History   Socioeconomic History   Marital status: Married    Spouse name: Not on file   Number of children: 1   Years of education: Not on file   Highest education level: Some college, no degree  Occupational History   Not on file  Tobacco Use   Smoking status: Some Days    Current packs/day: 1.00    Average packs/day: 1 pack/day for 54.4 years (54.4 ttl pk-yrs)  Types: Cigarettes    Start date: 11/12/1969    Passive exposure: Current   Smokeless tobacco: Never   Tobacco comments:    10-27-2021  pt stated trying to quit, last cigarette 7 days ago,  had gotten down to 3-4 cig per week (pt started smoking age 59)  Vaping Use   Vaping status: Never Used  Substance and Sexual Activity   Alcohol use: Not Currently    Alcohol/week: 1.0 standard drink of alcohol    Types: 1 Cans of beer per week    Comment: occasional   Drug use: Not Currently    Comment: 10-27-2021  pt stated last cocine age 79s and last marijuana age early 66s   Sexual activity: Yes  Other Topics Concern   Not on file  Social History Narrative   Not on file   Social Drivers of Health   Financial Resource Strain: Low Risk  (04/07/2024)   Overall Financial Resource Strain (CARDIA)    Difficulty of Paying Living Expenses: Not hard at all  Food Insecurity: No Food Insecurity (04/07/2024)   Hunger Vital Sign    Worried About Running Out of Food in the Last Year: Never true    Ran Out of Food in the Last Year: Never true  Transportation Needs: No Transportation Needs (04/07/2024)   PRAPARE - Administrator, Civil Service (Medical): No    Lack of Transportation  (Non-Medical): No  Physical Activity: Insufficiently Active (04/07/2024)   Exercise Vital Sign    Days of Exercise per Week: 2 days    Minutes of Exercise per Session: 20 min  Stress: No Stress Concern Present (04/07/2024)   [employer] of Occupational Health - Occupational Stress Questionnaire    Feeling of Stress : Only a little  Social Connections: Socially Integrated (04/07/2024)   Social Connection and Isolation Panel [NHANES]    Frequency of Communication with Friends and Family: More than three times a week    Frequency of Social Gatherings with Friends and Family: Twice a week    Attends Religious Services: 1 to 4 times per year    Active Member of [employer] or Organizations: Yes    Attends Engineer, structural: More than 4 times per year    Marital Status: Married  Catering manager Violence: Not At Risk (04/07/2024)   Humiliation, Afraid, Rape, and Kick questionnaire    Fear of Current or Ex-Partner: No    Emotionally Abused: No    Physically Abused: No    Sexually Abused: No    Family History  Problem Relation Age of Onset   Colon cancer Neg Hx    Esophageal cancer Neg Hx    Rectal cancer Neg Hx    Stomach cancer Neg Hx    Colon polyps Neg Hx      Review of Systems  Constitutional: Negative.  Negative for chills and fever.  HENT: Negative.  Negative for congestion and sore throat.   Respiratory: Negative.  Negative for cough and shortness of breath.   Cardiovascular: Negative.  Negative for chest pain and palpitations.  Gastrointestinal:  Negative for abdominal pain, diarrhea, nausea and vomiting.  Genitourinary: Negative.  Negative for dysuria and hematuria.  Musculoskeletal:  Positive for joint pain.  Skin: Negative.  Negative for rash.  Neurological:  Negative for dizziness and headaches.  All other systems reviewed and are negative.   Today's Vitals   04/08/24 1303  BP: (!) 158/68  Pulse: 71  Temp: 98.4 F (36.9 C)  SpO2: 96%  Weight: 196 lb  (88.9 kg)  Height: 6' 1.5" (1.867 m)   Body mass index is 25.51 kg/m.   Physical Exam Vitals reviewed.  Constitutional:      Appearance: Normal appearance.  HENT:     Head: Normocephalic.     Right Ear: Tympanic membrane, ear canal and external ear normal.     Left Ear: Tympanic membrane, ear canal and external ear normal.     Mouth/Throat:     Mouth: Mucous membranes are moist.     Pharynx: Oropharynx is clear.  Eyes:     Extraocular Movements: Extraocular movements intact.     Conjunctiva/sclera: Conjunctivae normal.     Pupils: Pupils are equal, round, and reactive to light.  Cardiovascular:     Rate and Rhythm: Normal rate and regular rhythm.     Pulses: Normal pulses.     Heart sounds: Normal heart sounds.  Pulmonary:     Effort: Pulmonary effort is normal.     Breath sounds: Normal breath sounds.  Abdominal:     Palpations: Abdomen is soft.     Tenderness: There is no abdominal tenderness.  Musculoskeletal:     Cervical back: No tenderness.     Right lower leg: No edema.     Left lower leg: No edema.  Lymphadenopathy:     Cervical: No cervical adenopathy.  Skin:    General: Skin is warm and dry.     Capillary Refill: Capillary refill takes less than 2 seconds.  Neurological:     General: No focal deficit present.     Mental Status: He is alert and oriented to person, place, and time.  Psychiatric:        Mood and Affect: Mood normal.        Behavior: Behavior normal.      ASSESSMENT & PLAN: Problem List Items Addressed This Visit       Cardiovascular and Mediastinum   Essential hypertension   BP Readings from Last 3 Encounters:  04/08/24 (!) 158/68  12/03/23 128/72  11/22/23 123/64  Elevated blood pressure reading in the office but normal readings at home. Continue diltiazem  240 mg daily Cardiovascular risks associated with hypertension discussed Diet and nutrition discussed        Relevant Orders   CBC with Differential/Platelet    Comprehensive metabolic panel with GFR   Coronary atherosclerosis of native coronary artery   Stable.  No recent anginal episodes.      Relevant Orders   Lipid panel   Atherosclerosis of aorta (HCC)   Relevant Orders   Lipid panel     Respiratory   COPD GOLD 2 still smoking    Still smoking but wants to quit Motivated.  Recommend Chantix  Continues Breztri  2 puffs twice a day and albuterol  as rescue inhaler No recent flareups      Relevant Medications   varenicline  (CHANTIX ) 0.5 MG tablet     Genitourinary   Stage 3b chronic kidney disease (HCC)   Was able to follow-up with nephrologist Has appointment this summer for follow-up Most recent BMP shows worsening GFR Advised to stay well-hydrated and avoid NSAIDs      Relevant Orders   Comprehensive metabolic panel with GFR   Prostate cancer (HCC)   Stable.  Lower urinary tract symptoms much improved on Flomax  0.4 mg at bedtime      Relevant Orders   PSA     Other   Hyperlipidemia LDL goal <70   Diet  and nutrition discussed. Continue rosuvastatin  20 mg daily.      Relevant Orders   Lipid panel   Cigarette smoker   Motivated to quit. Recommend to start Chantix       Relevant Medications   varenicline  (CHANTIX ) 0.5 MG tablet   Prediabetes   Diet and nutrition discussed.      Relevant Orders   Hemoglobin A1c   Other Visit Diagnoses       Encounter for general adult medical examination with abnormal findings    -  Primary     Screening for deficiency anemia       Relevant Orders   CBC with Differential/Platelet     Screening for endocrine, metabolic and immunity disorder       Relevant Orders   Comprehensive metabolic panel with GFR      Modifiable risk factors discussed with patient. Anticipatory guidance according to age provided. The following topics were also discussed: Social Determinants of Health Smoking Diet and nutrition Benefits of exercise Cancer screening and review of most recent  colonoscopy report Vaccinations review and recommendations Cardiovascular risk assessment and need for blood work Review of multiple chronic medical conditions under management Review of all medications Mental health including depression and anxiety Fall and accident prevention  Patient Instructions  Health Maintenance, Male Adopting a healthy lifestyle and getting preventive care are important in promoting health and wellness. Ask your health care provider about: The right schedule for you to have regular tests and exams. Things you can do on your own to prevent diseases and keep yourself healthy. What should I know about diet, weight, and exercise? Eat a healthy diet  Eat a diet that includes plenty of vegetables, fruits, low-fat dairy products, and lean protein. Do not eat a lot of foods that are high in solid fats, added sugars, or sodium. Maintain a healthy weight Body mass index (BMI) is a measurement that can be used to identify possible weight problems. It estimates body fat based on height and weight. Your health care provider can help determine your BMI and help you achieve or maintain a healthy weight. Get regular exercise Get regular exercise. This is one of the most important things you can do for your health. Most adults should: Exercise for at least 150 minutes each week. The exercise should increase your heart rate and make you sweat (moderate-intensity exercise). Do strengthening exercises at least twice a week. This is in addition to the moderate-intensity exercise. Spend less time sitting. Even light physical activity can be beneficial. Watch cholesterol and blood lipids Have your blood tested for lipids and cholesterol at 73 years of age, then have this test every 5 years. You may need to have your cholesterol levels checked more often if: Your lipid or cholesterol levels are high. You are older than 73 years of age. You are at high risk for heart disease. What  should I know about cancer screening? Many types of cancers can be detected early and may often be prevented. Depending on your health history and family history, you may need to have cancer screening at various ages. This may include screening for: Colorectal cancer. Prostate cancer. Skin cancer. Lung cancer. What should I know about heart disease, diabetes, and high blood pressure? Blood pressure and heart disease High blood pressure causes heart disease and increases the risk of stroke. This is more likely to develop in people who have high blood pressure readings or are overweight. Talk with your health care provider about your  target blood pressure readings. Have your blood pressure checked: Every 3-5 years if you are 65-15 years of age. Every year if you are 17 years old or older. If you are between the ages of 43 and 37 and are a current or former smoker, ask your health care provider if you should have a one-time screening for abdominal aortic aneurysm (AAA). Diabetes Have regular diabetes screenings. This checks your fasting blood sugar level. Have the screening done: Once every three years after age 69 if you are at a normal weight and have a low risk for diabetes. More often and at a younger age if you are overweight or have a high risk for diabetes. What should I know about preventing infection? Hepatitis B If you have a higher risk for hepatitis B, you should be screened for this virus. Talk with your health care provider to find out if you are at risk for hepatitis B infection. Hepatitis C Blood testing is recommended for: Everyone born from [birth year] through 1965. Anyone with known risk factors for hepatitis C. Sexually transmitted infections (STIs) You should be screened each year for STIs, including gonorrhea and chlamydia, if: You are sexually active and are younger than 73 years of age. You are older than 73 years of age and your health care provider tells you that you are  at risk for this type of infection. Your sexual activity has changed since you were last screened, and you are at increased risk for chlamydia or gonorrhea. Ask your health care provider if you are at risk. Ask your health care provider about whether you are at high risk for HIV. Your health care provider may recommend a prescription medicine to help prevent HIV infection. If you choose to take medicine to prevent HIV, you should first get tested for HIV. You should then be tested every 3 months for as long as you are taking the medicine. Follow these instructions at home: Alcohol use Do not drink alcohol if your health care provider tells you not to drink. If you drink alcohol: Limit how much you have to 0-2 drinks a day. Know how much alcohol is in your drink. In the [place] one drink equals one 12 oz bottle of beer (355 mL), one 5 oz glass of wine (148 mL), or one 1 oz glass of hard liquor (44 mL). Lifestyle Do not use any products that contain nicotine or tobacco. These products include cigarettes, chewing tobacco, and vaping devices, such as e-cigarettes. If you need help quitting, ask your health care provider. Do not use street drugs. Do not share needles. Ask your health care provider for help if you need support or information about quitting drugs. General instructions Schedule regular health, dental, and eye exams. Stay current with your vaccines. Tell your health care provider if: You often feel depressed. You have ever been abused or do not feel safe at home. Summary Adopting a healthy lifestyle and getting preventive care are important in promoting health and wellness. Follow your health care provider's instructions about healthy diet, exercising, and getting tested or screened for diseases. Follow your health care provider's instructions on monitoring your cholesterol and blood pressure. This information is not intended to replace advice given to you by your health care provider.  Make sure you discuss any questions you have with your health care provider. Document Revised: 03/20/2021 Document Reviewed: 03/20/2021 Elsevier Patient Education  2024 Elsevier Inc.      Maryagnes Small, MD [facility]

## 2024-04-08 NOTE — Assessment & Plan Note (Addendum)
 BP Readings from Last 3 Encounters:  04/08/24 (!) 158/68  12/03/23 128/72  11/22/23 123/64  Elevated blood pressure reading in the office but normal readings at home. Continue diltiazem  240 mg daily Cardiovascular risks associated with hypertension discussed Diet and nutrition discussed

## 2024-04-08 NOTE — Assessment & Plan Note (Signed)
 Diet and nutrition discussed.

## 2024-04-08 NOTE — Assessment & Plan Note (Signed)
 Diet and nutrition discussed.  Continue rosuvastatin 20 mg daily.

## 2024-04-08 NOTE — Assessment & Plan Note (Signed)
 Stable.  Lower urinary tract symptoms much improved on Flomax  0.4 mg at bedtime

## 2024-04-08 NOTE — Assessment & Plan Note (Signed)
 Still smoking but wants to quit Motivated.  Recommend Chantix  Continues Breztri  2 puffs twice a day and albuterol  as rescue inhaler No recent flareups

## 2024-04-27 NOTE — Telephone Encounter (Signed)
 Patient visits today for their handicap placard form. We have written a new form and are waiting on signature from Dr.Sagardia

## 2024-05-05 NOTE — Telephone Encounter (Signed)
 Reason for CRM: Patient calling in to see if form for handicap sticker has been signed. Would like a call to be discussed

## 2024-05-06 NOTE — Telephone Encounter (Signed)
 LVM for patient informing him,parking placard is ready for pick up and is at the front for him to pick up

## 2024-05-07 ENCOUNTER — Other Ambulatory Visit (HOSPITAL_COMMUNITY): Payer: Self-pay | Admitting: Emergency Medicine

## 2024-05-07 ENCOUNTER — Encounter: Payer: Self-pay | Admitting: Emergency Medicine

## 2024-05-07 ENCOUNTER — Ambulatory Visit (INDEPENDENT_AMBULATORY_CARE_PROVIDER_SITE_OTHER): Admitting: Emergency Medicine

## 2024-05-07 ENCOUNTER — Other Ambulatory Visit (HOSPITAL_COMMUNITY): Payer: Self-pay | Admitting: Cardiology

## 2024-05-07 VITALS — BP 130/58 | HR 71 | Temp 97.4°F | Ht 73.25 in | Wt 188.0 lb

## 2024-05-07 DIAGNOSIS — R7303 Prediabetes: Secondary | ICD-10-CM

## 2024-05-07 DIAGNOSIS — C61 Malignant neoplasm of prostate: Secondary | ICD-10-CM

## 2024-05-07 DIAGNOSIS — J449 Chronic obstructive pulmonary disease, unspecified: Secondary | ICD-10-CM

## 2024-05-07 DIAGNOSIS — F17200 Nicotine dependence, unspecified, uncomplicated: Secondary | ICD-10-CM

## 2024-05-07 DIAGNOSIS — I739 Peripheral vascular disease, unspecified: Secondary | ICD-10-CM

## 2024-05-07 DIAGNOSIS — I2510 Atherosclerotic heart disease of native coronary artery without angina pectoris: Secondary | ICD-10-CM | POA: Diagnosis not present

## 2024-05-07 DIAGNOSIS — I700 Atherosclerosis of aorta: Secondary | ICD-10-CM | POA: Diagnosis not present

## 2024-05-07 DIAGNOSIS — N1832 Chronic kidney disease, stage 3b: Secondary | ICD-10-CM | POA: Diagnosis not present

## 2024-05-07 NOTE — Assessment & Plan Note (Signed)
Stable.  No recent anginal episodes.

## 2024-05-07 NOTE — Assessment & Plan Note (Signed)
 Symptoms getting worse and affecting quality of life Recommend repeat of Doppler arterial ultrasounds of lower extremities and vascular surgery evaluation.  Referral placed today.

## 2024-05-07 NOTE — Assessment & Plan Note (Signed)
 Was able to follow-up with nephrologist Has appointment this summer for follow-up Most recent BMP shows worsening GFR Advised to stay well-hydrated and avoid NSAID

## 2024-05-07 NOTE — Assessment & Plan Note (Signed)
 Still smoking.  Contributing to peripheral artery disease. Smoking cessation advice given

## 2024-05-07 NOTE — Assessment & Plan Note (Signed)
 Still smoking Continues Breztri  2 puffs twice a day and albuterol  as rescue inhaler No recent flareups

## 2024-05-07 NOTE — Assessment & Plan Note (Signed)
 Stable.  Lower urinary tract symptoms much improved on Flomax  0.4 mg at bedtime

## 2024-05-07 NOTE — Assessment & Plan Note (Signed)
 Diet and nutrition discussed.  Continue rosuvastatin 20 mg daily.

## 2024-05-07 NOTE — Patient Instructions (Signed)
 Leg Pain That Comes and Goes (Intermittent Claudication): What to Know Intermittent claudication is when you feel pain in one or both legs while walking or exercising, but the pain goes away when you rest. This condition is a symptom of peripheral vascular disease (PVD). PVD is a disease of the blood vessels. What are the causes?  This condition is caused by a buildup of fatty substances and plaque in the arteries. When this happens, it's called atherosclerosis. Plaque makes arteries stiff and narrow, which prevents proper blood flow to the leg muscles. This causes pain when walking or exercising. What increases the risk? You're more likely to get this condition if you: Smoke cigarettes. Have a personal history of stroke or heart disease. Are older. Are inactive or overweight. Have a family history of atherosclerosis. Have another health condition, like: Diabetes. High blood pressure. High cholesterol. What are the signs or symptoms? You may feel pain in the feet, calf, thigh, hip, or butt over time. Symptoms may include: Aches or pains when walking. Cramps. A feeling of tightness, weakness, or heaviness. A wound on the lower leg or foot that heals poorly or doesn't heal. How is this diagnosed? Intermittent claudication may be diagnosed based on: Your symptoms and medical history. A physical exam. Tests. These may include: Ankle-brachial index (ABI). This test checks blood pressure in the legs and compares it to the pressure in the arms. Exercise ankle-brachial test. You walk on a treadmill, and tests are done to check how the condition affects your ability to walk or exercise. Arterial duplex ultrasound. This shows how blood flows in arteries. CT angiogram (CTA). An X-ray machine is used to take pictures of the blood vessels after dye is injected. Magnetic resonance angiogram (MRA). This makes images of blood vessels and the blood flow within them. Angiogram. Dye is injected into  arteries, and then X-rays are taken. Blood tests. How is this treated? Treatment involves treating the underlying cause and managing the risk factors. It may include: Lifestyle changes, like: Starting a supervised or home exercise program. Losing weight. Quitting smoking. Medicines to help restore blood flow through your legs. Medicines to treat other health conditions. If you have symptoms that affect your everyday activities, or have a wound that's not healing, treatment may include: Angioplasty to open a blocked artery using an inflated balloon. Stent placement to open a blocked artery using a small mesh tube. Surgery to restore blood flow by creating a bypass around a blocked area. Follow these instructions at home: Lifestyle  Stay at a healthy weight. Consider working with an expert in healthy eating called a dietitian. This person can help you make healthy food choices. Do not smoke, vape, or use nicotine or tobacco. If your provider suggested an exercise program, follow it as told. Your program may include: Walking three or more times a week. Walking until you have symptoms of intermittent claudication, then resting until your symptoms go away, and then starting to walk again. Slowly increasing your total walking time to about 50 minutes a day. General instructions Work with your provider to manage other health conditions that may raise your risk. Take your medicines only as told. Where to find more information To learn more: Go to ThisJobs.cz. Click Health Topics. Click Peripheral Artery Disease. Contact a health care provider if: Your pain doesn't go away with rest. You have sores on your legs that: Don't heal. Have pus or a bad smell. Your condition gets worse or doesn't improve with treatment. Get help  right away if: You have chest pain. You have trouble breathing. Your foot or leg is cold or it changes color. Your foot or leg gets numb. You have any signs of a  stroke. "BE FAST" is an easy way to remember the main warning signs: B - Balance. Feeling dizzy, sudden trouble walking, or loss of balance. E - Eyes. Trouble seeing or a change in how you see. F - Face. Sudden weakness or feeling numb in the face. The face or eyelid may droop on one side. A - Arms. Weakness or loss of feeling in an arm. This happens fast and often only on one side. S - Speech. Sudden trouble speaking, slurred speech, or trouble understanding what people say. T - Time. Time to call 911. Write down what time symptoms started. Other signs of a stroke can be: A sudden, very bad headache with no known cause. Feeling like you may throw up. Throwing up. These symptoms may be an emergency. Call 911 right away. Do not wait to see if the symptoms will go away. Do not drive yourself to the hospital. This information is not intended to replace advice given to you by your health care provider. Make sure you discuss any questions you have with your health care provider. Document Revised: 05/12/2023 Document Reviewed: 05/12/2023 Elsevier Patient Education  2024 [employer].

## 2024-05-07 NOTE — Assessment & Plan Note (Signed)
 Diet and nutrition discussed.

## 2024-05-07 NOTE — Progress Notes (Signed)
 Andrew [name] 73 y.o.   Chief Complaint  Patient presents with   Leg Pain    Patient states he's been having calf pain in both legs when he walks and has been going on for a long time now, his mother Rn mentioned that he needs to have that looked at. No swelling, no redness. Patient mentions also having a cold for about 3 weeks, states constant runny nose, congestion in the morning, cough, and pressure on his face only in the morning.     HISTORY OF PRESENT ILLNESS: This is a 73 y.o. male complaining of pain to both calfs that start 15-20 minutes after initiating walking.  Better with rest Also has history of COPD with chronic frequent mucus production.  Presently mucus is clear. No other complaints or medical concerns today.  Leg Pain      Prior to Admission medications   Medication Sig Start Date End Date Taking? Authorizing Provider  acetaminophen  (TYLENOL ) 500 MG tablet Take 1,000 mg by mouth every 6 (six) hours as needed for moderate pain.   Yes [provider]  albuterol  (PROAIR  HFA) 108 (90 Base) MCG/ACT inhaler 2 puffs every 4 hours as needed only  if your can't catch your breath 12/03/23  Yes Darlean Ozell NOVAK, MD  albuterol  (PROVENTIL  HFA;VENTOLIN  HFA) 108 (90 Base) MCG/ACT inhaler Inhale 2 puffs into the lungs every 4 (four) hours as needed for wheezing or shortness of breath (cough, shortness of breath or wheezing.). 06/29/17  Yes Loreli Elyn SAILOR, MD  albuterol  (PROVENTIL ) (2.5 MG/3ML) 0.083% nebulizer solution Take 3 mLs (2.5 mg total) by nebulization every 4 (four) hours as needed. 12/03/23  Yes Darlean Ozell NOVAK, MD  Budeson-Glycopyrrol-Formoterol  (BREZTRI  AEROSPHERE) 160-9-4.8 MCG/ACT AERO Take 2 puffs first thing in am and then another 2 puffs about 12 hours later. 12/03/23  Yes Darlean Ozell NOVAK, MD  cetirizine  (ZYRTEC ) 10 MG tablet TAKE 1 TABLET EVERY DAY 09/17/23  Yes Emmaclaire Switala Jose, MD  DILT-XR 240 MG 24 hr capsule TAKE 1 CAPSULE EVERY DAY 01/23/24  Yes Markela Wee,  Emil Schanz, MD  fluticasone  (FLONASE ) 50 MCG/ACT nasal spray Place 1 spray into both nostrils daily. 05/11/21  Yes Darlean Ozell NOVAK, MD  olmesartan  (BENICAR ) 40 MG tablet TAKE 1 TABLET EVERY DAY 11/11/23  Yes Mayerly Kaman, Emil Schanz, MD  omeprazole (PRILOSEC OTC) 20 MG tablet Take 20 mg by mouth daily as needed (acid reflux).   Yes [provider]  rosuvastatin  (CRESTOR ) 20 MG tablet Take 1 tablet (20 mg total) by mouth daily. 08/13/23  Yes Iriel Nason, Emil Schanz, MD  sildenafil  (VIAGRA ) 100 MG tablet Take 1 tablet (100 mg total) by mouth daily as needed for erectile dysfunction. 05/15/23  Yes McKenzie, Belvie LITTIE, MD  silodosin  (RAPAFLO ) 8 MG CAPS capsule Take 1 capsule (8 mg total) by mouth at bedtime. 11/16/22  Yes McKenzie, Belvie LITTIE, MD  solifenacin  (VESICARE ) 5 MG tablet Take 1 tablet (5 mg total) by mouth daily. 11/22/23  Yes McKenzie, Belvie LITTIE, MD  tadalafil  (CIALIS ) 20 MG tablet Take 1 tablet (20 mg total) by mouth daily as needed for erectile dysfunction. 11/22/23  Yes McKenzie, Belvie LITTIE, MD  tamsulosin  (FLOMAX ) 0.4 MG CAPS capsule Take 1 capsule (0.4 mg total) by mouth in the morning and at bedtime. 11/22/23  Yes McKenzie, Belvie LITTIE, MD  triamcinolone  cream (KENALOG ) 0.1 % APPLY TO THE AFFECTED AREA(S) TOPICALLY TWICE DAILY 11/26/23  Yes Brigham Cobbins, Emil Schanz, MD  varenicline  (CHANTIX ) 0.5 MG tablet Take 1 tablet (  0.5 mg total) by mouth 2 (two) times daily. 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   Atherosclerosis of aorta (HCC) 04/20/2021   Acquired thrombophilia (HCC) 05/05/2020   Prostate cancer (HCC) 01/14/2018   Carotid artery stenosis, asymptomatic, bilateral 12/06/2017   Vitamin D  deficiency 12/03/2017   Prediabetes 11/14/2017   Stage 3b chronic kidney disease (HCC) 11/13/2017   Cigarette  smoker 08/20/2017   COPD GOLD 2 still smoking  07/31/2017   Coronary atherosclerosis of native coronary artery 07/31/2017   Benign prostatic hyperplasia with urinary obstruction 05/30/2016   Allergic rhinitis 06/01/2008   DENTAL CARIES 06/01/2008   GERD 03/04/2008   Hyperlipidemia LDL goal <70 01/30/2008   Essential hypertension 01/30/2008   PERIPHERAL VASCULAR DISEASE 01/30/2008   COCAINE ABUSE 01/30/2008    Past Medical History:  Diagnosis Date   Allergic rhinitis    Anal lesion    polypoid   Atherosclerosis of aorta (HCC)    Carotid artery stenosis, asymptomatic, bilateral 11/2017   last carotid ultrasound in epic 07-19-2021  right ECA >50% and left ICA 40-59% stenosis   CKD (chronic kidney disease), stage III (HCC)    followed by pcp;  previously followed by nephrologist--- dr wanetta  (per pt lov appprox 2020)   DOE (dyspnea on exertion)    10-27-2021  pt stated has normal doe due to COPD, but past couple days it has even more so, Dr Darlean started pt on prednisone  today   ED (erectile dysfunction)    GERD (gastroesophageal reflux disease)    History of adenomatous polyp of colon    Hyperplasia of prostate with lower urinary tract symptoms (LUTS)    Hypertension    followed by pcp;   nuclear stress test 07-13-2021 low risk no ischemia w/ nuclear ef 62%;  echo 07-13-2021 ef 55-60%, aortic valve w/ mild AR with mild to moderate sclerosis, RVSP 36.19mmHg   Peripheral vascular disease (HCC)    Pre-diabetes    Prostate cancer [facility]) 2018   urologist-- dr sherrilee;  first dx 02/ 2018 Gleason 3+3, PSA 6.4 active survillance;  MRI fusion bx 04-21-2021, Gleason 3+4, PSA 20.5   Renal cyst, acquired    bilateral   Stage 2 moderate COPD by GOLD classification [facility])    pulmonologist--- dr darlean;  emphysema still smoking ;  pt does not have oxygen (10-27-2021  pt stated trying to quit last cigarette in 7 days, had cut done to 3-4 cig per week)   Wears dentures    full upper and partial  lower   Wears glasses     Past Surgical History:  Procedure Laterality Date   COLONOSCOPY WITH PROPOFOL   03/28/2021   by dr teressa   GOLD SEED IMPLANT N/A 11/30/2021   Procedure: GOLD SEED IMPLANT;  Surgeon: sherrilee Belvie CROME, MD;  Location: [facility];  Service: Urology;  Laterality: N/A;   NO PAST SURGERIES     SPACE OAR INSTILLATION N/A 11/30/2021   Procedure: SPACE OAR INSTILLATION;  Surgeon: sherrilee Belvie CROME, MD;  Location: [facility];  Service: Urology;  Laterality: N/A;   TRANSANAL EXCISION OF RECTAL MASS N/A 10/30/2021   Procedure: TRANSANAL EXCISION OF ANAL CANAL POLYPOID LESION;  Surgeon: Teresa Lonni HERO, MD;  Location: [facility];  Service: General;  Laterality: N/A;  Social History   Socioeconomic History   Marital status: Married    Spouse name: Not on file   Number of children: 1   Years of education: Not on file   Highest education level: Some college, no degree  Occupational History   Not on file  Tobacco Use   Smoking status: Some Days    Current packs/day: 1.00    Average packs/day: 1 pack/day for 54.5 years (54.5 ttl pk-yrs)    Types: Cigarettes    Start date: 11/12/1969    Passive exposure: Current   Smokeless tobacco: Never   Tobacco comments:    10-27-2021  pt stated trying to quit, last cigarette 7 days ago,  had gotten down to 3-4 cig per week (pt started smoking age 4)  Vaping Use   Vaping status: Never Used  Substance and Sexual Activity   Alcohol use: Not Currently    Alcohol/week: 1.0 standard drink of alcohol    Types: 1 Cans of beer per week    Comment: occasional   Drug use: Not Currently    Comment: 10-27-2021  pt stated last cocine age 24s and last marijuana age early 89s   Sexual activity: Yes  Other Topics Concern   Not on file  Social History Narrative   Not on file   Social Drivers of Health   Financial Resource Strain: Low Risk  (04/07/2024)   Overall Financial Resource Strain (CARDIA)    Difficulty of  Paying Living Expenses: Not hard at all  Food Insecurity: No Food Insecurity (04/07/2024)   Hunger Vital Sign    Worried About Running Out of Food in the Last Year: Never true    Ran Out of Food in the Last Year: Never true  Transportation Needs: No Transportation Needs (04/07/2024)   PRAPARE - Administrator, Civil Service (Medical): No    Lack of Transportation (Non-Medical): No  Physical Activity: Insufficiently Active (04/07/2024)   Exercise Vital Sign    Days of Exercise per Week: 2 days    Minutes of Exercise per Session: 20 min  Stress: No Stress Concern Present (04/07/2024)   [employer] of Occupational Health - Occupational Stress Questionnaire    Feeling of Stress : Only a little  Social Connections: Socially Integrated (04/07/2024)   Social Connection and Isolation Panel    Frequency of Communication with Friends and Family: More than three times a week    Frequency of Social Gatherings with Friends and Family: Twice a week    Attends Religious Services: 1 to 4 times per year    Active Member of [employer] or Organizations: Yes    Attends Engineer, structural: More than 4 times per year    Marital Status: Married  Catering manager Violence: Not At Risk (04/07/2024)   Humiliation, Afraid, Rape, and Kick questionnaire    Fear of Current or Ex-Partner: No    Emotionally Abused: No    Physically Abused: No    Sexually Abused: No    Family History  Problem Relation Age of Onset   Colon cancer Neg Hx    Esophageal cancer Neg Hx    Rectal cancer Neg Hx    Stomach cancer Neg Hx    Colon polyps Neg Hx      Review of Systems  Constitutional: Negative.  Negative for chills and fever.  HENT:  Positive for congestion. Negative for sore throat.   Respiratory:  Negative for cough and shortness of breath.   Cardiovascular:  Negative for chest pain and palpitations.  Gastrointestinal:  Negative for abdominal pain, diarrhea, nausea and vomiting.  Genitourinary:  Negative.  Negative for dysuria and hematuria.  Skin: Negative.   Neurological: Negative.  Negative for dizziness and headaches.  All other systems reviewed and are negative.   Vitals:   05/07/24 0957  BP: (!) 130/58  Pulse: 71  Temp: (!) 97.4 F (36.3 C)  SpO2: 94%    Physical Exam Vitals reviewed.  Constitutional:      Appearance: Normal appearance.  HENT:     Head: Normocephalic.     Mouth/Throat:     Mouth: Mucous membranes are moist.     Pharynx: Oropharynx is clear.   Eyes:     Extraocular Movements: Extraocular movements intact.     Pupils: Pupils are equal, round, and reactive to light.    Cardiovascular:     Rate and Rhythm: Normal rate and regular rhythm.     Pulses: Normal pulses.     Heart sounds: Normal heart sounds.  Pulmonary:     Effort: Pulmonary effort is normal.     Breath sounds: Normal breath sounds.   Musculoskeletal:     Cervical back: No tenderness.     Comments: Lower extremities: No erythema or swelling.  Poor peripheral pulses.  No skin breakdowns.  No signs of DVT.  Lymphadenopathy:     Cervical: No cervical adenopathy.   Skin:    General: Skin is warm and dry.     Capillary Refill: Capillary refill takes less than 2 seconds.   Neurological:     General: No focal deficit present.     Mental Status: He is alert and oriented to person, place, and time.   Psychiatric:        Mood and Affect: Mood normal.        Behavior: Behavior normal.      ASSESSMENT & PLAN: A total of 45 minutes was spent with the patient and counseling/coordination of care regarding preparing for this visit, review of most recent office visit notes, review of multiple chronic medical conditions and their management, diagnosis of peripheral artery disease and need for vascular surgery evaluation, review of all medications, review of most recent bloodwork results, review of health maintenance items, education on nutrition, prognosis, documentation, and need for  follow up.   Problem List Items Addressed This Visit       Cardiovascular and Mediastinum   Peripheral vascular disease (HCC) - Primary   Symptoms getting worse and affecting quality of life Recommend repeat of Doppler arterial ultrasounds of lower extremities and vascular surgery evaluation.  Referral placed today.      Coronary atherosclerosis of native coronary artery   Stable.  No recent anginal episodes.      Atherosclerosis of aorta (HCC)   Diet and nutrition discussed Continue rosuvastatin  20 mg daily        Respiratory   COPD GOLD 2 still smoking    Still smoking Continues Breztri  2 puffs twice a day and albuterol  as rescue inhaler No recent flareups        Genitourinary   Stage 3b chronic kidney disease (HCC)   Was able to follow-up with nephrologist Has appointment this summer for follow-up Most recent BMP shows worsening GFR Advised to stay well-hydrated and avoid NSAID      Prostate cancer (HCC)   Stable. Lower urinary tract symptoms much improved on Flomax  0.4 mg at bedtime         Other  Current smoker   Still smoking.  Contributing to peripheral artery disease. Smoking cessation advice given      Relevant Orders   Ambulatory referral to Vascular Surgery   Prediabetes   Diet and nutrition discussed.      Patient Instructions  Leg Pain That Comes and Goes (Intermittent Claudication): What to Know Intermittent claudication is when you feel pain in one or both legs while walking or exercising, but the pain goes away when you rest. This condition is a symptom of peripheral vascular disease (PVD). PVD is a disease of the blood vessels. What are the causes?  This condition is caused by a buildup of fatty substances and plaque in the arteries. When this happens, it's called atherosclerosis. Plaque makes arteries stiff and narrow, which prevents proper blood flow to the leg muscles. This causes pain when walking or exercising. What increases the  risk? You're more likely to get this condition if you: Smoke cigarettes. Have a personal history of stroke or heart disease. Are older. Are inactive or overweight. Have a family history of atherosclerosis. Have another health condition, like: Diabetes. High blood pressure. High cholesterol. What are the signs or symptoms? You may feel pain in the feet, calf, thigh, hip, or butt over time. Symptoms may include: Aches or pains when walking. Cramps. A feeling of tightness, weakness, or heaviness. A wound on the lower leg or foot that heals poorly or doesn't heal. How is this diagnosed? Intermittent claudication may be diagnosed based on: Your symptoms and medical history. A physical exam. Tests. These may include: Ankle-brachial index (ABI). This test checks blood pressure in the legs and compares it to the pressure in the arms. Exercise ankle-brachial test. You walk on a treadmill, and tests are done to check how the condition affects your ability to walk or exercise. Arterial duplex ultrasound. This shows how blood flows in arteries. CT angiogram (CTA). An X-ray machine is used to take pictures of the blood vessels after dye is injected. Magnetic resonance angiogram (MRA). This makes images of blood vessels and the blood flow within them. Angiogram. Dye is injected into arteries, and then X-rays are taken. Blood tests. How is this treated? Treatment involves treating the underlying cause and managing the risk factors. It may include: Lifestyle changes, like: Starting a supervised or home exercise program. Losing weight. Quitting smoking. Medicines to help restore blood flow through your legs. Medicines to treat other health conditions. If you have symptoms that affect your everyday activities, or have a wound that's not healing, treatment may include: Angioplasty to open a blocked artery using an inflated balloon. Stent placement to open a blocked artery using a small mesh  tube. Surgery to restore blood flow by creating a bypass around a blocked area. Follow these instructions at home: Lifestyle  Stay at a healthy weight. Consider working with an expert in healthy eating called a dietitian. This person can help you make healthy food choices. Do not smoke, vape, or use nicotine or tobacco. If your provider suggested an exercise program, follow it as told. Your program may include: Walking three or more times a week. Walking until you have symptoms of intermittent claudication, then resting until your symptoms go away, and then starting to walk again. Slowly increasing your total walking time to about 50 minutes a day. General instructions Work with your provider to manage other health conditions that may raise your risk. Take your medicines only as told. Where to find more information To learn more: Go  to ThisJobs.cz. Click Health Topics. Click Peripheral Artery Disease. Contact a health care provider if: Your pain doesn't go away with rest. You have sores on your legs that: Don't heal. Have pus or a bad smell. Your condition gets worse or doesn't improve with treatment. Get help right away if: You have chest pain. You have trouble breathing. Your foot or leg is cold or it changes color. Your foot or leg gets numb. You have any signs of a stroke. BE FAST is an easy way to remember the main warning signs: B - Balance. Feeling dizzy, sudden trouble walking, or loss of balance. E - Eyes. Trouble seeing or a change in how you see. F - Face. Sudden weakness or feeling numb in the face. The face or eyelid may droop on one side. A - Arms. Weakness or loss of feeling in an arm. This happens fast and often only on one side. S - Speech. Sudden trouble speaking, slurred speech, or trouble understanding what people say. T - Time. Time to call 911. Write down what time symptoms started. Other signs of a stroke can be: A sudden, very bad headache with no known  cause. Feeling like you may throw up. Throwing up. These symptoms may be an emergency. Call 911 right away. Do not wait to see if the symptoms will go away. Do not drive yourself to the hospital. This information is not intended to replace advice given to you by your health care provider. Make sure you discuss any questions you have with your health care provider. Document Revised: 05/12/2023 Document Reviewed: 05/12/2023 Elsevier Patient Education  2024 Elsevier Inc.     Emil Schaumann, MD [facility]

## 2024-05-08 NOTE — Telephone Encounter (Signed)
 Does not need a prescription.  Recommend over-the-counter Mucinex  DM.

## 2024-05-11 ENCOUNTER — Ambulatory Visit: Admitting: Emergency Medicine

## 2024-05-11 ENCOUNTER — Telehealth: Payer: Self-pay | Admitting: Emergency Medicine

## 2024-05-11 ENCOUNTER — Ambulatory Visit: Payer: Self-pay

## 2024-05-11 NOTE — Telephone Encounter (Signed)
 FYI Only or Action Required?: Action required by provider: request for appointment.  Patient was last seen in primary care on 05/07/2024 by Purcell Emil Schanz, MD. Called Nurse Triage reporting Cough. Symptoms began several weeks ago. Interventions attempted: Rest, hydration, or home remedies. Symptoms are: unchanged.  Triage Disposition: See HCP Within 4 Hours (Or PCP Triage)  Patient/caregiver understands and will follow disposition?: YesCopied from CRM [id number]. Topic: Clinical - Red Word Triage >> May 11, 2024  8:14 AM Larissa RAMAN wrote: [facility] that prompted transfer to Nurse Triage: cough x 2 weeks Reason for Disposition  [1] MILD difficulty breathing (e.g., minimal/no SOB at rest, SOB with walking, pulse <100) AND [2] still present when not coughing  Answer Assessment - Initial Assessment Questions 1. ONSET: When did the cough begin?      2 weeks ago 2. SEVERITY: How bad is the cough today?      Keeps pt awake at night 3. SPUTUM: Describe the color of your sputum (none, dry cough; clear, white, yellow, green)     white 4. HEMOPTYSIS: Are you coughing up any blood? If so ask: How much? (flecks, streaks, tablespoons, etc.)     Na  5. DIFFICULTY BREATHING: Are you having difficulty breathing? If Yes, ask: How bad is it? (e.g., mild, moderate, severe)    - MILD: No SOB at rest, mild SOB with walking, speaks normally in sentences, can lie down, no retractions, pulse < 100.    - MODERATE: SOB at rest, SOB with minimal exertion and prefers to sit, cannot lie down flat, speaks in phrases, mild retractions, audible wheezing, pulse 100-120.    - SEVERE: Very SOB at rest, speaks in single words, struggling to breathe, sitting hunched forward, retractions, pulse > 120      mild 6. FEVER: Do you have a fever? If Yes, ask: What is your temperature, how was it measured, and when did it start?     Denies  7. CARDIAC HISTORY: Do you have any history of heart disease? (e.g.,  heart attack, congestive heart failure)      denies 8. LUNG HISTORY: Do you have any history of lung disease?  (e.g., pulmonary embolus, asthma, emphysema)     COPD 9. PE RISK FACTORS: Do you have a history of blood clots? (or: recent major surgery, recent prolonged travel, bedridden)     No history 10. OTHER SYMPTOMS: Do you have any other symptoms? (e.g., runny nose, wheezing, chest pain)       Runny nose, wheezing, headache   Pt uses inhaler to help with wheezing and it goes away. Pt states the cough keeps him up at night. No fever. SOB is inconsistent due to COPD. Pt is not in distress while on triage call.  Protocols used: Cough - Acute Productive-A-AH

## 2024-05-11 NOTE — Telephone Encounter (Signed)
 Copied from CRM [id number]. Topic: Clinical - Medical Advice >> May 11, 2024  8:49 AM Cleave MATSU wrote: Reason for CRM: Dr sagardia advised him to take some mucinex  he wants to know if he can take it with having high blood pressure

## 2024-05-11 NOTE — Telephone Encounter (Signed)
 Yes, he can take Mucinex  or Mucinex  DM.

## 2024-05-18 ENCOUNTER — Other Ambulatory Visit: Payer: Medicare HMO

## 2024-05-18 ENCOUNTER — Other Ambulatory Visit: Payer: Self-pay

## 2024-05-18 DIAGNOSIS — C61 Malignant neoplasm of prostate: Secondary | ICD-10-CM | POA: Diagnosis not present

## 2024-05-19 ENCOUNTER — Ambulatory Visit: Payer: Self-pay | Admitting: Urology

## 2024-05-19 LAB — PSA: Prostate Specific Ag, Serum: 0.7 ng/mL (ref 0.0–4.0)

## 2024-05-22 ENCOUNTER — Encounter: Payer: Self-pay | Admitting: Urology

## 2024-05-22 ENCOUNTER — Ambulatory Visit: Payer: Medicare HMO | Admitting: Urology

## 2024-05-22 VITALS — BP 107/57 | HR 81

## 2024-05-22 DIAGNOSIS — N401 Enlarged prostate with lower urinary tract symptoms: Secondary | ICD-10-CM | POA: Diagnosis not present

## 2024-05-22 DIAGNOSIS — N138 Other obstructive and reflux uropathy: Secondary | ICD-10-CM

## 2024-05-22 DIAGNOSIS — N5201 Erectile dysfunction due to arterial insufficiency: Secondary | ICD-10-CM

## 2024-05-22 DIAGNOSIS — C61 Malignant neoplasm of prostate: Secondary | ICD-10-CM | POA: Diagnosis not present

## 2024-05-22 DIAGNOSIS — R351 Nocturia: Secondary | ICD-10-CM

## 2024-05-22 LAB — URINALYSIS, ROUTINE W REFLEX MICROSCOPIC
Bilirubin, UA: NEGATIVE
Glucose, UA: NEGATIVE
Ketones, UA: NEGATIVE
Leukocytes,UA: NEGATIVE
Nitrite, UA: NEGATIVE
Protein,UA: NEGATIVE
RBC, UA: NEGATIVE
Specific Gravity, UA: 1.02 (ref 1.005–1.030)
Urobilinogen, Ur: 0.2 mg/dL (ref 0.2–1.0)
pH, UA: 6 (ref 5.0–7.5)

## 2024-05-22 MED ORDER — TAMSULOSIN HCL 0.4 MG PO CAPS: 0.4000 mg | ORAL_CAPSULE | Freq: Two times a day (BID) | ORAL | 11 refills | Status: DC

## 2024-05-22 MED ORDER — SILDENAFIL CITRATE 100 MG PO TABS: 100.0000 mg | ORAL_TABLET | Freq: Every day | ORAL | 5 refills | Status: DC | PRN

## 2024-05-22 MED ORDER — SOLIFENACIN SUCCINATE 5 MG PO TABS: 5.0000 mg | ORAL_TABLET | Freq: Every day | ORAL | 3 refills | Status: DC

## 2024-05-22 NOTE — Patient Instructions (Signed)

## 2024-05-22 NOTE — Progress Notes (Signed)
 05/22/2024 9:49 AM   Andrew [name] Jan 30, 1951 [id number]  Referring provider: Purcell Emil Schanz, MD [address] [place],  [place] [address]  Followup prostate cancer and BPH   HPI: [name] is a 73yo here for followup for prostate cancer, BPH, erectile dysfunction. PSA decreased to 0.7 from 1.0. IPSS 9 QOL 1 on flomax  0.4mg  BID and vesicare  5mg  daily. Nocturia 0-1x. Uirne stream is mostly strong. He has occasional urinary urgency but no incontinence. He used sildenafil  100mg  prn with good results   PMH: Past Medical History:  Diagnosis Date   Allergic rhinitis    Anal lesion    polypoid   Atherosclerosis of aorta (HCC)    Carotid artery stenosis, asymptomatic, bilateral 11/2017   last carotid ultrasound in epic 07-19-2021  right ECA >50% and left ICA 40-59% stenosis   CKD (chronic kidney disease), stage III (HCC)    followed by pcp;  previously followed by nephrologist--- dr wanetta  (per pt lov appprox 2020)   DOE (dyspnea on exertion)    10-27-2021  pt stated has normal doe due to COPD, but past couple days it has even more so, Dr Darlean started pt on prednisone  today   ED (erectile dysfunction)    GERD (gastroesophageal reflux disease)    History of adenomatous polyp of colon    Hyperplasia of prostate with lower urinary tract symptoms (LUTS)    Hypertension    followed by pcp;   nuclear stress test 07-13-2021 low risk no ischemia w/ nuclear ef 62%;  echo 07-13-2021 ef 55-60%, aortic valve w/ mild AR with mild to moderate sclerosis, RVSP 36.2mmHg   Peripheral vascular disease (HCC)    Pre-diabetes    Prostate cancer [facility]) 2018   urologist-- dr sherrilee;  first dx 02/ 2018 Gleason 3+3, PSA 6.4 active survillance;  MRI fusion bx 04-21-2021, Gleason 3+4, PSA 20.5   Renal cyst, acquired    bilateral   Stage 2 moderate COPD by GOLD classification [facility])    pulmonologist--- dr darlean;  emphysema still smoking ;  pt does not have oxygen (10-27-2021  pt stated trying to  quit last cigarette in 7 days, had cut done to 3-4 cig per week)   Wears dentures    full upper and partial lower   Wears glasses     Surgical History: Past Surgical History:  Procedure Laterality Date   COLONOSCOPY WITH PROPOFOL   03/28/2021   by dr teressa   GOLD SEED IMPLANT N/A 11/30/2021   Procedure: GOLD SEED IMPLANT;  Surgeon: sherrilee Belvie LITTIE, MD;  Location: [facility];  Service: Urology;  Laterality: N/A;   NO PAST SURGERIES     SPACE OAR INSTILLATION N/A 11/30/2021   Procedure: SPACE OAR INSTILLATION;  Surgeon: sherrilee Belvie LITTIE, MD;  Location: [facility];  Service: Urology;  Laterality: N/A;   TRANSANAL EXCISION OF RECTAL MASS N/A 10/30/2021   Procedure: TRANSANAL EXCISION OF ANAL CANAL POLYPOID LESION;  Surgeon: Teresa Lonni HERO, MD;  Location: [facility];  Service: General;  Laterality: N/A;    Home Medications:  Allergies as of 05/22/2024       Reactions   Lisinopril -hydrochlorothiazide     dizziness        Medication List        Accurate as of May 22, 2024  9:49 AM. If you have any questions, ask your nurse or doctor.          acetaminophen  500 MG tablet Commonly known as: TYLENOL  Take 1,000  mg by mouth every 6 (six) hours as needed for moderate pain.   albuterol  108 (90 Base) MCG/ACT inhaler Commonly known as: VENTOLIN  HFA Inhale 2 puffs into the lungs every 4 (four) hours as needed for wheezing or shortness of breath (cough, shortness of breath or wheezing.).   albuterol  108 (90 Base) MCG/ACT inhaler Commonly known as: ProAir  HFA 2 puffs every 4 hours as needed only  if your can't catch your breath   albuterol  (2.5 MG/3ML) 0.083% nebulizer solution Commonly known as: PROVENTIL  Take 3 mLs (2.5 mg total) by nebulization every 4 (four) hours as needed.   Breztri  Aerosphere 160-9-4.8 MCG/ACT Aero inhaler Generic drug: budesonide -glycopyrrolate -formoterol  Take 2 puffs first thing in am and then another 2 puffs about 12 hours later.    cetirizine  10 MG tablet Commonly known as: ZYRTEC  TAKE 1 TABLET EVERY DAY   Dilt-XR 240 MG 24 hr capsule Generic drug: diltiazem  TAKE 1 CAPSULE EVERY DAY   fluticasone  50 MCG/ACT nasal spray Commonly known as: FLONASE  Place 1 spray into both nostrils daily.   olmesartan  40 MG tablet Commonly known as: BENICAR  TAKE 1 TABLET EVERY DAY   omeprazole 20 MG tablet Commonly known as: PRILOSEC OTC Take 20 mg by mouth daily as needed (acid reflux).   rosuvastatin  20 MG tablet Commonly known as: CRESTOR  Take 1 tablet (20 mg total) by mouth daily.   sildenafil  100 MG tablet Commonly known as: VIAGRA  Take 1 tablet (100 mg total) by mouth daily as needed for erectile dysfunction.   silodosin  8 MG Caps capsule Commonly known as: RAPAFLO  Take 1 capsule (8 mg total) by mouth at bedtime.   solifenacin  5 MG tablet Commonly known as: VESICARE  Take 1 tablet (5 mg total) by mouth daily.   tadalafil  20 MG tablet Commonly known as: CIALIS  Take 1 tablet (20 mg total) by mouth daily as needed for erectile dysfunction.   tamsulosin  0.4 MG Caps capsule Commonly known as: FLOMAX  Take 1 capsule (0.4 mg total) by mouth in the morning and at bedtime.   triamcinolone  cream 0.1 % Commonly known as: KENALOG  APPLY TO THE AFFECTED AREA(S) TOPICALLY TWICE DAILY   varenicline  0.5 MG tablet Commonly known as: Chantix  Take 1 tablet (0.5 mg total) by mouth 2 (two) times daily.        Allergies:  Allergies  Allergen Reactions   Lisinopril -Hydrochlorothiazide      dizziness    Family History: Family History  Problem Relation Age of Onset   Colon cancer Neg Hx    Esophageal cancer Neg Hx    Rectal cancer Neg Hx    Stomach cancer Neg Hx    Colon polyps Neg Hx     Social History:  reports that he has been smoking cigarettes. He started smoking about 54 years ago. He has a 54.5 pack-year smoking history. He has been exposed to tobacco smoke. He has never used smokeless tobacco. He reports  that he does not currently use alcohol after a past usage of about 1.0 standard drink of alcohol per week. He reports that he does not currently use drugs.  ROS: All other review of systems were reviewed and are negative except what is noted above in HPI  Physical Exam: BP (!) 107/57   Pulse 81   Constitutional:  Alert and oriented, No acute distress. HEENT: [place] AT, moist mucus membranes.  Trachea midline, no masses. Cardiovascular: No clubbing, cyanosis, or edema. Respiratory: Normal respiratory effort, no increased work of breathing. GI: Abdomen is soft, nontender, nondistended,  no abdominal masses GU: No CVA tenderness.  Lymph: No cervical or inguinal lymphadenopathy. Skin: No rashes, bruises or suspicious lesions. Neurologic: Grossly intact, no focal deficits, moving all 4 extremities. Psychiatric: Normal mood and affect.  Laboratory Data: Lab Results  Component Value Date   WBC 6.4 04/08/2024   HGB 13.9 04/08/2024   HCT 41.8 04/08/2024   MCV 85.9 04/08/2024   PLT 227.0 04/08/2024    Lab Results  Component Value Date   CREATININE 1.41 04/08/2024    Lab Results  Component Value Date   PSA 0.70 04/08/2024   PSA 5.4 (H) 09/14/2016   PSA 5.58 (H) 01/24/2016    No results found for: TESTOSTERONE  Lab Results  Component Value Date   HGBA1C 6.0 04/08/2024    Urinalysis    Component Value Date/Time   APPEARANCEUR Clear 11/22/2023 0920   GLUCOSEU Negative 11/22/2023 0920   BILIRUBINUR Negative 11/22/2023 0920   KETONESUR negative 10/17/2017 1615   PROTEINUR 1+ (A) 11/22/2023 0920   UROBILINOGEN 0.2 10/17/2017 1615   NITRITE Negative 11/22/2023 0920   LEUKOCYTESUR Negative 11/22/2023 0920    Lab Results  Component Value Date   LABMICR See below: 11/22/2023   WBCUA None seen 11/22/2023   LABEPIT 0-10 11/22/2023   MUCUS Present 06/09/2021   BACTERIA None seen 11/22/2023    Pertinent Imaging:  No results found for this or any previous visit.  No  results found for this or any previous visit.  No results found for this or any previous visit.  No results found for this or any previous visit.  Results for orders placed during the hospital encounter of 05/27/20  US  RENAL  Narrative CLINICAL DATA:  CKD stage 3  EXAM: RENAL / URINARY TRACT ULTRASOUND COMPLETE  COMPARISON:  Chest CT acquired on September 25, 2019, limited assessment of the kidneys at that time.  FINDINGS: Right Kidney:  Renal measurements: 10.7 x 4.0 x 4.5 cm = volume: 102 mL. Small hypoechoic to anechoic focus in the upper pole of the RIGHT kidney shows some central increased echogenicity and perhaps multi septate changes.  3.9 x 3.3 x 2.7 cm anechoic cyst with adjacent smaller cystic area 1.2 x 1.3 x 1.3 cm cannot be classified as a simple cyst and does not show overt signs of flow. No hydronephrosis. Decreased corticomedullary differentiation.  Left Kidney:  Renal measurements: 10.6 x 5.7 x 4.8 cm = volume: 151 mL. Decreased corticomedullary differentiation without hydronephrosis. Small mildly complex areas with multi septate or spongiform appearance in the upper pole and interpolar portion largest measuring 1.6 x 1.4 x 1.5 cm with smaller areas, less than 10 also showing some cystic characteristics.  Bladder:  Appears normal for degree of bladder distention.  Other:  None.  IMPRESSION: 1. Decreased corticomedullary differentiation without hydronephrosis. Findings can be seen in the setting of medical renal disease. 2. Multiple cystic lesions in the bilateral kidneys without renal enlargement. Some of these areas display internal complexity/variable echogenicity. MRI may be helpful for further evaluation to exclude the presence of enhancing components/small cystic renal neoplasm.  These results will be called to the ordering clinician or representative by the Radiologist Assistant, and communication documented in the PACS or [employer].   Electronically Signed By: Isla Blind M.D. On: 05/30/2020 10:55  No results found for this or any previous visit.  No results found for this or any previous visit.  No results found for this or any previous visit.   Assessment & Plan:  1. Prostate cancer (HCC) (Primary) -followup 6 months with PSA - Urinalysis, Routine w reflex microscopic  2. Benign prostatic hyperplasia with urinary obstruction Continue flomax  BD  3. Nocturia Continue vesicare  5mg  daily  4. Erectile dysfunction due to arterial insufficiency Sildenafil  100mg  prn   No follow-ups on file.  Belvie Clara, MD  [facility]

## 2024-06-03 ENCOUNTER — Other Ambulatory Visit: Payer: Self-pay | Admitting: Emergency Medicine

## 2024-06-03 DIAGNOSIS — I700 Atherosclerosis of aorta: Secondary | ICD-10-CM

## 2024-06-22 ENCOUNTER — Other Ambulatory Visit (HOSPITAL_COMMUNITY): Payer: Self-pay

## 2024-06-22 ENCOUNTER — Encounter: Payer: Self-pay | Admitting: Surgery

## 2024-06-22 ENCOUNTER — Ambulatory Visit (HOSPITAL_COMMUNITY)
Admission: RE | Admit: 2024-06-22 | Discharge: 2024-06-22 | Disposition: A | Discharge status: Home / Self Care | Source: Ambulatory Visit | Attending: Surgery | Admitting: Surgery

## 2024-06-22 ENCOUNTER — Ambulatory Visit: Attending: Surgery | Admitting: Surgery

## 2024-06-22 ENCOUNTER — Ambulatory Visit: Payer: Self-pay | Admitting: Emergency Medicine

## 2024-06-22 VITALS — BP 130/65 | HR 65 | Temp 97.7°F | Resp 18 | Ht 73.25 in | Wt 185.1 lb

## 2024-06-22 DIAGNOSIS — I70213 Atherosclerosis of native arteries of extremities with intermittent claudication, bilateral legs: Secondary | ICD-10-CM | POA: Diagnosis not present

## 2024-06-22 DIAGNOSIS — I739 Peripheral vascular disease, unspecified: Secondary | ICD-10-CM | POA: Diagnosis not present

## 2024-06-22 LAB — VAS US ABI WITH/WO TBI
Left ABI: 0.49
Right ABI: 0.48

## 2024-06-22 MED ORDER — CILOSTAZOL 100 MG PO TABS
100.0000 mg | ORAL_TABLET | Freq: Two times a day (BID) | ORAL | 11 refills | Status: AC
  Filled 2024-06-22: qty 60, 30d supply, fill #0

## 2024-06-22 NOTE — Progress Notes (Signed)
 Vascular and Vein Specialist of [facility]  Patient name: Andrew [name] MRN: [id number] DOB: 1951-07-02 Sex: male   REQUESTING PROVIDER:    Emil Schaumann   REASON FOR CONSULT:    PAD  HISTORY OF PRESENT ILLNESS:   Andrew [name] is a 73 y.o. male, who is referred for evaluation of claudication.  The patient states that for years he has had bilateral calf cramping.  This is progressed so that he can only walk approximately 1 block.  He does not have rest pain.  He does not have nonhealing wounds.    The patient is a current smoker.  He takes a statin for hypercholesterolemia.  He has stage III chronic renal insufficiency.  He has a left carotid stenosis of 40 to 59% from a ultrasound in 2022.  He is a prediabetic.  He is medically managed for hypertension.  PAST MEDICAL HISTORY    Past Medical History:  Diagnosis Date   Allergic rhinitis    Anal lesion    polypoid   Atherosclerosis of aorta (HCC)    Carotid artery stenosis, asymptomatic, bilateral 11/2017   last carotid ultrasound in epic 07-19-2021  right ECA >50% and left ICA 40-59% stenosis   CKD (chronic kidney disease), stage III (HCC)    followed by pcp;  previously followed by nephrologist--- dr wanetta  (per pt lov appprox 2020)   DOE (dyspnea on exertion)    10-27-2021  pt stated has normal doe due to COPD, but past couple days it has even more so, Dr Darlean started pt on prednisone  today   ED (erectile dysfunction)    GERD (gastroesophageal reflux disease)    History of adenomatous polyp of colon    Hyperplasia of prostate with lower urinary tract symptoms (LUTS)    Hypertension    followed by pcp;   nuclear stress test 07-13-2021 low risk no ischemia w/ nuclear ef 62%;  echo 07-13-2021 ef 55-60%, aortic valve w/ mild AR with mild to moderate sclerosis, RVSP 36.81mmHg   Peripheral vascular disease (HCC)    Pre-diabetes    Prostate cancer [facility]) 2018   urologist-- dr  sherrilee;  first dx 02/ 2018 Gleason 3+3, PSA 6.4 active survillance;  MRI fusion bx 04-21-2021, Gleason 3+4, PSA 20.5   Renal cyst, acquired    bilateral   Stage 2 moderate COPD by GOLD classification [facility])    pulmonologist--- dr darlean;  emphysema still smoking ;  pt does not have oxygen (10-27-2021  pt stated trying to quit last cigarette in 7 days, had cut done to 3-4 cig per week)   Wears dentures    full upper and partial lower   Wears glasses      FAMILY HISTORY   Family History  Problem Relation Age of Onset   Colon cancer Neg Hx    Esophageal cancer Neg Hx    Rectal cancer Neg Hx    Stomach cancer Neg Hx    Colon polyps Neg Hx     SOCIAL HISTORY:   Social History   Socioeconomic History   Marital status: Married    Spouse name: Not on file   Number of children: 1   Years of education: Not on file   Highest education level: Some college, no degree  Occupational History   Not on file  Tobacco Use   Smoking status: Some Days    Current packs/day: 1.00    Average packs/day: 1 pack/day for 54.6 years (54.6 ttl pk-yrs)  Types: Cigarettes    Start date: 11/12/1969    Passive exposure: Current   Smokeless tobacco: Never   Tobacco comments:    10-27-2021  pt stated trying to quit, last cigarette 7 days ago,  had gotten down to 3-4 cig per week (pt started smoking age 57)  Vaping Use   Vaping status: Never Used  Substance and Sexual Activity   Alcohol use: Not Currently    Alcohol/week: 1.0 standard drink of alcohol    Types: 1 Cans of beer per week    Comment: occasional   Drug use: Not Currently    Comment: 10-27-2021  pt stated last cocine age 14s and last marijuana age early 17s   Sexual activity: Yes  Other Topics Concern   Not on file  Social History Narrative   Not on file   Social Drivers of Health   Financial Resource Strain: Low Risk  (04/07/2024)   Overall Financial Resource Strain (CARDIA)    Difficulty of Paying Living Expenses: Not hard at all   Food Insecurity: No Food Insecurity (04/07/2024)   Hunger Vital Sign    Worried About Running Out of Food in the Last Year: Never true    Ran Out of Food in the Last Year: Never true  Transportation Needs: No Transportation Needs (04/07/2024)   PRAPARE - Administrator, Civil Service (Medical): No    Lack of Transportation (Non-Medical): No  Physical Activity: Insufficiently Active (04/07/2024)   Exercise Vital Sign    Days of Exercise per Week: 2 days    Minutes of Exercise per Session: 20 min  Stress: No Stress Concern Present (04/07/2024)   [employer] of Occupational Health - Occupational Stress Questionnaire    Feeling of Stress : Only a little  Social Connections: Socially Integrated (04/07/2024)   Social Connection and Isolation Panel    Frequency of Communication with Friends and Family: More than three times a week    Frequency of Social Gatherings with Friends and Family: Twice a week    Attends Religious Services: 1 to 4 times per year    Active Member of [employer] or Organizations: Yes    Attends Engineer, structural: More than 4 times per year    Marital Status: Married  Catering manager Violence: Not At Risk (04/07/2024)   Humiliation, Afraid, Rape, and Kick questionnaire    Fear of Current or Ex-Partner: No    Emotionally Abused: No    Physically Abused: No    Sexually Abused: No    ALLERGIES:    Allergies  Allergen Reactions   Lisinopril -Hydrochlorothiazide      dizziness    CURRENT MEDICATIONS:    Current Outpatient Medications  Medication Sig Dispense Refill   acetaminophen  (TYLENOL ) 500 MG tablet Take 1,000 mg by mouth every 6 (six) hours as needed for moderate pain.     albuterol  (PROAIR  HFA) 108 (90 Base) MCG/ACT inhaler 2 puffs every 4 hours as needed only  if your can't catch your breath 18 g 1   albuterol  (PROVENTIL  HFA;VENTOLIN  HFA) 108 (90 Base) MCG/ACT inhaler Inhale 2 puffs into the lungs every 4 (four) hours as needed for  wheezing or shortness of breath (cough, shortness of breath or wheezing.). 1 Inhaler 11   albuterol  (PROVENTIL ) (2.5 MG/3ML) 0.083% nebulizer solution Take 3 mLs (2.5 mg total) by nebulization every 4 (four) hours as needed. 75 mL 12   Budeson-Glycopyrrol-Formoterol  (BREZTRI  AEROSPHERE) 160-9-4.8 MCG/ACT AERO Take 2 puffs first thing in am and  then another 2 puffs about 12 hours later. 10.7 g 11   cetirizine  (ZYRTEC ) 10 MG tablet TAKE 1 TABLET EVERY DAY 90 tablet 3   DILT-XR 240 MG 24 hr capsule TAKE 1 CAPSULE EVERY DAY 90 capsule 3   olmesartan  (BENICAR ) 40 MG tablet TAKE 1 TABLET EVERY DAY 90 tablet 3   omeprazole (PRILOSEC OTC) 20 MG tablet Take 20 mg by mouth daily as needed (acid reflux).     rosuvastatin  (CRESTOR ) 20 MG tablet TAKE 1 TABLET EVERY DAY 90 tablet 3   sildenafil  (VIAGRA ) 100 MG tablet Take 1 tablet (100 mg total) by mouth daily as needed for erectile dysfunction. 10 tablet 5   solifenacin  (VESICARE ) 5 MG tablet Take 1 tablet (5 mg total) by mouth daily. 90 tablet 3   tamsulosin  (FLOMAX ) 0.4 MG CAPS capsule Take 1 capsule (0.4 mg total) by mouth in the morning and at bedtime. 60 capsule 11   triamcinolone  cream (KENALOG ) 0.1 % APPLY TO THE AFFECTED AREA(S) TOPICALLY TWICE DAILY 80 g 11   No current facility-administered medications for this visit.    REVIEW OF SYSTEMS:   [X]  denotes positive finding, [ ]  denotes negative finding Cardiac  Comments:  Chest pain or chest pressure:    Shortness of breath upon exertion:    Short of breath when lying flat:    Irregular heart rhythm:        Vascular    Pain in calf, thigh, or hip brought on by ambulation: x   Pain in feet at night that wakes you up from your sleep:     Blood clot in your veins:    Leg swelling:         Pulmonary    Oxygen at home:    Productive cough:     Wheezing:         Neurologic    Sudden weakness in arms or legs:     Sudden numbness in arms or legs:     Sudden onset of difficulty speaking or  slurred speech:    Temporary loss of vision in one eye:     Problems with dizziness:         Gastrointestinal    Blood in stool:      Vomited blood:         Genitourinary    Burning when urinating:     Blood in urine:        Psychiatric    Major depression:         Hematologic    Bleeding problems:    Problems with blood clotting too easily:        Skin    Rashes or ulcers:        Constitutional    Fever or chills:     PHYSICAL EXAM:   Vitals:   06/22/24 1145  BP: 130/65  Pulse: 65  Resp: 18  Temp: 97.7 F (36.5 C)  TempSrc: Temporal  SpO2: 98%  Weight: 185 lb 1.6 oz (84 kg)  Height: 6' 1.25 (1.861 m)    GENERAL: The patient is a well-nourished male, in no acute distress. The vital signs are documented above. CARDIAC: There is a regular rate and rhythm.  VASCULAR: Palpable femoral pulses.  Nonpalpable popliteal or pedal pulses PULMONARY: Nonlabored respirations ABDOMEN: Soft and non-tender with normal pitched bowel sounds.  MUSCULOSKELETAL: There are no major deformities or cyanosis. NEUROLOGIC: No focal weakness or paresthesias are detected. SKIN: There are no ulcers or rashes  noted. PSYCHIATRIC: The patient has a normal affect.  STUDIES:   I have reviewed the following: +-------+-----------+-----------+------------+------------+  ABI/TBIToday's ABIToday's TBIPrevious ABIPrevious TBI  +-------+-----------+-----------+------------+------------+  Right 0.48       0.24                                 +-------+-----------+-----------+------------+------------+  Left  0.49       0.29                                 +-------+-----------+-----------+------------+------------+   ASSESSMENT and PLAN   PAD with claudication: I discussed the importance of medical management.  We talked about the significance of smoking cessation.  He is already on a statin and blood pressure control.  I am starting him on cilostazol  to see if this helps improve  his walking distance.  He would also benefit from aspirin  therapy.  I will have him return in 3 months for follow-up.  I am also going to repeat his carotid Doppler study at that time   Malvina New, IV, MD, FACS [facility] [phone] Pager [phone]

## 2024-06-23 ENCOUNTER — Other Ambulatory Visit: Payer: Self-pay

## 2024-06-23 DIAGNOSIS — I6523 Occlusion and stenosis of bilateral carotid arteries: Secondary | ICD-10-CM

## 2024-06-24 ENCOUNTER — Telehealth: Payer: Self-pay

## 2024-06-24 NOTE — Telephone Encounter (Signed)
 Called pt regarding his questionnaire submission. He has c/o dizziness and lightheaded since starting Pletal . He has been advised per APP to discontinue use. Pt is aware and has f/u in 3 months. Will notify MD to see if he wants to make any changes to plan.

## 2024-06-25 ENCOUNTER — Other Ambulatory Visit (HOSPITAL_COMMUNITY): Payer: Self-pay

## 2024-06-25 ENCOUNTER — Telehealth: Payer: Self-pay

## 2024-06-25 ENCOUNTER — Other Ambulatory Visit: Payer: Self-pay

## 2024-06-25 MED ORDER — PENTOXIFYLLINE ER 400 MG PO TBCR
400.0000 mg | EXTENDED_RELEASE_TABLET | Freq: Three times a day (TID) | ORAL | 11 refills | Status: AC
  Filled 2024-06-25: qty 90, 30d supply, fill #0

## 2024-06-25 NOTE — Telephone Encounter (Signed)
 Pt called stating he was feeling dizzy and lightheaded after starting Pletal  earlier this week. He has been advised to d/c use and try Trental  400mg /TID. He has been encouraged to continue walking program and smoking cessation. He has f/u scheduled in 3 months. Pt verbalized understanding of the above; no further questions/concerns at this time.

## 2024-07-22 DIAGNOSIS — I129 Hypertensive chronic kidney disease with stage 1 through stage 4 chronic kidney disease, or unspecified chronic kidney disease: Secondary | ICD-10-CM | POA: Diagnosis not present

## 2024-07-22 DIAGNOSIS — N1831 Chronic kidney disease, stage 3a: Secondary | ICD-10-CM | POA: Diagnosis not present

## 2024-07-22 DIAGNOSIS — C61 Malignant neoplasm of prostate: Secondary | ICD-10-CM | POA: Diagnosis not present

## 2024-07-22 DIAGNOSIS — E785 Hyperlipidemia, unspecified: Secondary | ICD-10-CM | POA: Diagnosis not present

## 2024-07-22 DIAGNOSIS — R361 Hematospermia: Secondary | ICD-10-CM | POA: Diagnosis not present

## 2024-07-22 DIAGNOSIS — N179 Acute kidney failure, unspecified: Secondary | ICD-10-CM | POA: Diagnosis not present

## 2024-07-22 DIAGNOSIS — R7303 Prediabetes: Secondary | ICD-10-CM | POA: Diagnosis not present

## 2024-07-22 DIAGNOSIS — I739 Peripheral vascular disease, unspecified: Secondary | ICD-10-CM | POA: Diagnosis not present

## 2024-07-22 DIAGNOSIS — Z720 Tobacco use: Secondary | ICD-10-CM | POA: Diagnosis not present

## 2024-08-10 ENCOUNTER — Telehealth: Payer: Self-pay

## 2024-08-10 NOTE — Telephone Encounter (Signed)
 Copied from CRM #[id number]. Topic: Clinical - Prescription Issue >> Aug 10, 2024  9:16 AM Joesph PARAS wrote: Reason for CRM: Patient called [employer]  and [employer]  wants to ensure we have the correct fax number for them, which is [phone].

## 2024-08-12 ENCOUNTER — Telehealth: Payer: Self-pay | Admitting: Urology

## 2024-08-12 NOTE — Telephone Encounter (Signed)
 Patient called into the office today with general questions/concerns regarding Vesicare . He received a letter from insurance advising him not to take RX due to a risk of increase in falls. Patient may be reached at [phone] to discuss questions.

## 2024-08-12 NOTE — Telephone Encounter (Signed)
 Return call to pt making him aware there always a risk but there will not increase your risk for falling. Per Dr. Sherrilee. Pt is made aware to increase fluid intake. Verbalized understanding

## 2024-08-29 ENCOUNTER — Other Ambulatory Visit: Payer: Self-pay | Admitting: Emergency Medicine

## 2024-08-29 DIAGNOSIS — I10 Essential (primary) hypertension: Secondary | ICD-10-CM

## 2024-09-28 ENCOUNTER — Ambulatory Visit: Admitting: Surgery

## 2024-09-28 ENCOUNTER — Encounter (HOSPITAL_COMMUNITY)

## 2024-10-12 ENCOUNTER — Ambulatory Visit: Admitting: Surgery

## 2024-10-12 ENCOUNTER — Ambulatory Visit (HOSPITAL_COMMUNITY)
Admission: RE | Admit: 2024-10-12 | Discharge: 2024-10-12 | Disposition: A | Discharge status: Home / Self Care | Source: Ambulatory Visit | Attending: Surgery | Admitting: Surgery

## 2024-10-12 ENCOUNTER — Encounter: Payer: Self-pay | Admitting: Surgery

## 2024-10-12 VITALS — BP 134/56 | HR 55 | Temp 98.0°F | Ht 73.25 in | Wt 187.0 lb

## 2024-10-12 DIAGNOSIS — I6523 Occlusion and stenosis of bilateral carotid arteries: Secondary | ICD-10-CM | POA: Diagnosis not present

## 2024-10-12 DIAGNOSIS — I70213 Atherosclerosis of native arteries of extremities with intermittent claudication, bilateral legs: Secondary | ICD-10-CM

## 2024-10-12 NOTE — Progress Notes (Signed)
 Vascular and Vein Specialist of [facility]  Patient name: Andrew [name] MRN: [id number] DOB: [birth year] Sex: male   REASON FOR VISIT:    Follow up  HISOTRY OF PRESENT ILLNESS:    Andrew [name] is a 73 y.o. male who I first saw in August 2025 for bilateral calf cramping.  He was only able to walk approximately 1 block.  He was without rest pain or nonhealing wounds.  I discussed the importance of medical management.  We talked about smoking cessation.  He was started on cilostazol  and a exercise program.  He was already taking a statin and antihypertensive medications.  He is back today for follow-up.  His symptoms are slightly better.  He does not think the cilostazol  worked.  He does not have any new wounds  The patient is a current smoker. He takes a statin for hypercholesterolemia. He has stage III chronic renal insufficiency. He has a left carotid stenosis of 40 to 59% from a ultrasound in 2022. He is a prediabetic. He is medically managed for hypertension.  PAST MEDICAL HISTORY:   Past Medical History:  Diagnosis Date   Allergic rhinitis    Anal lesion    polypoid   Atherosclerosis of aorta    Carotid artery stenosis, asymptomatic, bilateral 11/2017   last carotid ultrasound in epic 07-19-2021  right ECA >50% and left ICA 40-59% stenosis   CKD (chronic kidney disease), stage III (HCC)    followed by pcp;  previously followed by nephrologist--- dr wanetta  (per pt lov appprox 2020)   DOE (dyspnea on exertion)    10-27-2021  pt stated has normal doe due to COPD, but past couple days it has even more so, Dr Darlean started pt on prednisone  today   ED (erectile dysfunction)    GERD (gastroesophageal reflux disease)    History of adenomatous polyp of colon    Hyperplasia of prostate with lower urinary tract symptoms (LUTS)    Hypertension    followed by pcp;   nuclear stress test 07-13-2021 low risk no ischemia w/ nuclear ef 62%;  echo  07-13-2021 ef 55-60%, aortic valve w/ mild AR with mild to moderate sclerosis, RVSP 36.34mmHg   Peripheral vascular disease    Pre-diabetes    Prostate cancer [facility]) 2018   urologist-- dr sherrilee;  first dx 02/ 2018 Gleason 3+3, PSA 6.4 active survillance;  MRI fusion bx 04-21-2021, Gleason 3+4, PSA 20.5   Renal cyst, acquired    bilateral   Stage 2 moderate COPD by GOLD classification [facility])    pulmonologist--- dr darlean;  emphysema still smoking ;  pt does not have oxygen (10-27-2021  pt stated trying to quit last cigarette in 7 days, had cut done to 3-4 cig per week)   Wears dentures    full upper and partial lower   Wears glasses      FAMILY HISTORY:   Family History  Problem Relation Age of Onset   Colon cancer Neg Hx    Esophageal cancer Neg Hx    Rectal cancer Neg Hx    Stomach cancer Neg Hx    Colon polyps Neg Hx     SOCIAL HISTORY:   Social History   Tobacco Use   Smoking status: Some Days    Current packs/day: 1.00    Average packs/day: 1 pack/day for 54.9 years (54.9 ttl pk-yrs)    Types: Cigarettes    Start date: 11/12/1969    Passive exposure: Current   Smokeless  tobacco: Never   Tobacco comments:    10-27-2021  pt stated trying to quit, last cigarette 7 days ago,  had gotten down to 3-4 cig per week (pt started smoking age 46)  Substance Use Topics   Alcohol use: Not Currently    Alcohol/week: 1.0 standard drink of alcohol    Types: 1 Cans of beer per week    Comment: occasional     ALLERGIES:   Allergies  Allergen Reactions   Lisinopril -Hydrochlorothiazide      dizziness     CURRENT MEDICATIONS:   Current Outpatient Medications  Medication Sig Dispense Refill   pentoxifylline  (TRENTAL ) 400 MG CR tablet Take 1 tablet (400 mg total) by mouth 3 (three) times daily with meals. 90 tablet 11   acetaminophen  (TYLENOL ) 500 MG tablet Take 1,000 mg by mouth every 6 (six) hours as needed for moderate pain.     albuterol  (PROAIR  HFA) 108 (90 Base) MCG/ACT  inhaler 2 puffs every 4 hours as needed only  if your can't catch your breath 18 g 1   albuterol  (PROVENTIL  HFA;VENTOLIN  HFA) 108 (90 Base) MCG/ACT inhaler Inhale 2 puffs into the lungs every 4 (four) hours as needed for wheezing or shortness of breath (cough, shortness of breath or wheezing.). 1 Inhaler 11   albuterol  (PROVENTIL ) (2.5 MG/3ML) 0.083% nebulizer solution Take 3 mLs (2.5 mg total) by nebulization every 4 (four) hours as needed. 75 mL 12   Budeson-Glycopyrrol-Formoterol  (BREZTRI  AEROSPHERE) 160-9-4.8 MCG/ACT AERO Take 2 puffs first thing in am and then another 2 puffs about 12 hours later. 10.7 g 11   cetirizine  (ZYRTEC ) 10 MG tablet TAKE 1 TABLET EVERY DAY 90 tablet 3   cilostazol  (PLETAL ) 100 MG tablet Take 1 tablet (100 mg total) by mouth 2 (two) times daily before a meal. 60 tablet 11   DILT-XR 240 MG 24 hr capsule TAKE 1 CAPSULE EVERY DAY 90 capsule 3   olmesartan  (BENICAR ) 40 MG tablet TAKE 1 TABLET EVERY DAY 90 tablet 3   omeprazole (PRILOSEC OTC) 20 MG tablet Take 20 mg by mouth daily as needed (acid reflux).     rosuvastatin  (CRESTOR ) 20 MG tablet TAKE 1 TABLET EVERY DAY 90 tablet 3   sildenafil  (VIAGRA ) 100 MG tablet Take 1 tablet (100 mg total) by mouth daily as needed for erectile dysfunction. 10 tablet 5   solifenacin  (VESICARE ) 5 MG tablet Take 1 tablet (5 mg total) by mouth daily. 90 tablet 3   tamsulosin  (FLOMAX ) 0.4 MG CAPS capsule Take 1 capsule (0.4 mg total) by mouth in the morning and at bedtime. 60 capsule 11   triamcinolone  cream (KENALOG ) 0.1 % APPLY TO THE AFFECTED AREA(S) TOPICALLY TWICE DAILY 80 g 11   No current facility-administered medications for this visit.    REVIEW OF SYSTEMS:   [X]  denotes positive finding, [ ]  denotes negative finding Cardiac  Comments:  Chest pain or chest pressure:    Shortness of breath upon exertion:    Short of breath when lying flat:    Irregular heart rhythm:        Vascular    Pain in calf, thigh, or hip brought on  by ambulation: x   Pain in feet at night that wakes you up from your sleep:     Blood clot in your veins:    Leg swelling:         Pulmonary    Oxygen at home:    Productive cough:     Wheezing:  Neurologic    Sudden weakness in arms or legs:     Sudden numbness in arms or legs:     Sudden onset of difficulty speaking or slurred speech:    Temporary loss of vision in one eye:     Problems with dizziness:         Gastrointestinal    Blood in stool:     Vomited blood:         Genitourinary    Burning when urinating:     Blood in urine:        Psychiatric    Major depression:         Hematologic    Bleeding problems:    Problems with blood clotting too easily:        Skin    Rashes or ulcers:        Constitutional    Fever or chills:      PHYSICAL EXAM:   There were no vitals filed for this visit.  GENERAL: The patient is a well-nourished male, in no acute distress. The vital signs are documented above. CARDIAC: There is a regular rate and rhythm.  VASCULAR: Nonpalpable pedal pulses PULMONARY: Non-labored respirations MUSCULOSKELETAL: There are no major deformities or cyanosis. NEUROLOGIC: No focal weakness or paresthesias are detected. SKIN: There are no ulcers or rashes noted. PSYCHIATRIC: The patient has a normal affect.  STUDIES:   I have reviewed the following: Carotid: Right Carotid: Velocities in the right ICA are consistent with a 1-39%  stenosis.                The ECA appears >50% stenosed.   Left Carotid: Velocities in the left ICA are consistent with a 40-59%  stenosis.   Vertebrals: Bilateral vertebral arteries demonstrate antegrade flow.  Subclavians: Normal flow hemodynamics were seen in bilateral subclavian               arteries.   ABI/TBIToday's ABIToday's TBIPrevious ABIPrevious TBI  +-------+-----------+-----------+------------+------------+  Right 0.48       0.24                                  +-------+-----------+-----------+------------+------------+  Left  0.49       0.29                                 +-------+-----------+-----------+------------+------------+   MEDICAL ISSUES:   PAD: The patient continues to have claudication.  His symptoms are tolerable.  I stopped his cilostazol  as he did not think it made a difference.  I again talked about smoking cessation and an exercise program.  He will follow-up in 1 year with ABIs.  He knows to contact me sooner should he develop nonhealing wounds or a change in his symptoms  Carotid: He is asymptomatic.  He has 40 to 59% stenosis on the left.  I discussed this with him.  He will have surveillance imaging in 1 year    Malvina New, IV, MD, FACS [facility] and Vein Specialists of [facility] [phone] Pager [phone]

## 2024-10-23 ENCOUNTER — Other Ambulatory Visit: Payer: Self-pay | Admitting: Acute Care

## 2024-10-23 DIAGNOSIS — Z87891 Personal history of nicotine dependence: Secondary | ICD-10-CM

## 2024-10-23 DIAGNOSIS — Z122 Encounter for screening for malignant neoplasm of respiratory organs: Secondary | ICD-10-CM

## 2024-10-23 DIAGNOSIS — F17210 Nicotine dependence, cigarettes, uncomplicated: Secondary | ICD-10-CM

## 2024-10-29 ENCOUNTER — Ambulatory Visit (HOSPITAL_BASED_OUTPATIENT_CLINIC_OR_DEPARTMENT_OTHER)
Admission: RE | Admit: 2024-10-29 | Discharge: 2024-10-29 | Disposition: A | Discharge status: Home / Self Care | Source: Ambulatory Visit | Attending: Acute Care | Admitting: Acute Care

## 2024-10-29 DIAGNOSIS — Z122 Encounter for screening for malignant neoplasm of respiratory organs: Secondary | ICD-10-CM | POA: Diagnosis present

## 2024-10-29 DIAGNOSIS — I700 Atherosclerosis of aorta: Secondary | ICD-10-CM | POA: Diagnosis not present

## 2024-10-29 DIAGNOSIS — J439 Emphysema, unspecified: Secondary | ICD-10-CM | POA: Insufficient documentation

## 2024-10-29 DIAGNOSIS — I2510 Atherosclerotic heart disease of native coronary artery without angina pectoris: Secondary | ICD-10-CM | POA: Diagnosis not present

## 2024-10-29 DIAGNOSIS — F17210 Nicotine dependence, cigarettes, uncomplicated: Secondary | ICD-10-CM | POA: Insufficient documentation

## 2024-10-29 DIAGNOSIS — Z87891 Personal history of nicotine dependence: Secondary | ICD-10-CM

## 2024-11-09 ENCOUNTER — Other Ambulatory Visit: Payer: Self-pay

## 2024-11-09 DIAGNOSIS — Z122 Encounter for screening for malignant neoplasm of respiratory organs: Secondary | ICD-10-CM

## 2024-11-09 DIAGNOSIS — Z87891 Personal history of nicotine dependence: Secondary | ICD-10-CM

## 2024-11-09 DIAGNOSIS — F17210 Nicotine dependence, cigarettes, uncomplicated: Secondary | ICD-10-CM

## 2024-11-12 ENCOUNTER — Other Ambulatory Visit: Payer: Self-pay | Admitting: Emergency Medicine

## 2024-11-12 DIAGNOSIS — I10 Essential (primary) hypertension: Secondary | ICD-10-CM

## 2024-11-26 ENCOUNTER — Other Ambulatory Visit

## 2024-11-26 DIAGNOSIS — C61 Malignant neoplasm of prostate: Secondary | ICD-10-CM

## 2024-11-27 LAB — PSA: Prostate Specific Ag, Serum: 0.5 ng/mL (ref 0.0–4.0)

## 2024-12-02 ENCOUNTER — Ambulatory Visit: Admitting: Urology

## 2024-12-02 VITALS — BP 122/58 | HR 64

## 2024-12-02 DIAGNOSIS — N401 Enlarged prostate with lower urinary tract symptoms: Secondary | ICD-10-CM

## 2024-12-02 DIAGNOSIS — R350 Frequency of micturition: Secondary | ICD-10-CM

## 2024-12-02 DIAGNOSIS — C61 Malignant neoplasm of prostate: Secondary | ICD-10-CM | POA: Diagnosis not present

## 2024-12-02 DIAGNOSIS — R351 Nocturia: Secondary | ICD-10-CM

## 2024-12-02 DIAGNOSIS — N138 Other obstructive and reflux uropathy: Secondary | ICD-10-CM

## 2024-12-02 DIAGNOSIS — N5201 Erectile dysfunction due to arterial insufficiency: Secondary | ICD-10-CM

## 2024-12-02 LAB — URINALYSIS, ROUTINE W REFLEX MICROSCOPIC
Bilirubin, UA: NEGATIVE
Glucose, UA: NEGATIVE
Leukocytes,UA: NEGATIVE
Nitrite, UA: NEGATIVE
Specific Gravity, UA: 1.02 (ref 1.005–1.030)
Urobilinogen, Ur: 0.2 mg/dL (ref 0.2–1.0)
pH, UA: 6 (ref 5.0–7.5)

## 2024-12-02 LAB — MICROSCOPIC EXAMINATION: Bacteria, UA: NONE SEEN

## 2024-12-02 MED ORDER — SILDENAFIL CITRATE 100 MG PO TABS: 100.0000 mg | ORAL_TABLET | Freq: Every day | ORAL | 5 refills | Status: AC | PRN

## 2024-12-02 MED ORDER — TAMSULOSIN HCL 0.4 MG PO CAPS: 0.4000 mg | ORAL_CAPSULE | Freq: Two times a day (BID) | ORAL | 11 refills | Status: AC

## 2024-12-02 MED ORDER — SOLIFENACIN SUCCINATE 5 MG PO TABS: 5.0000 mg | ORAL_TABLET | Freq: Every day | ORAL | 3 refills | Status: DC

## 2024-12-02 NOTE — Progress Notes (Unsigned)
 "  @ENCDATE @ 11:41 AM   Andrew [name] 1951-04-13 [id number]  Referring provider: Purcell Emil Schanz, MD [address] [place],  [place] [address]  No chief complaint on file.   HPI: IPSS 10 QOL 1 on flomax  0.4mg  BID and vesicare  5mg  daily. Urine stream strong. No straining to urinate. PSA decreased to 0.5 from 0.7    PMH: Past Medical History:  Diagnosis Date   Allergic rhinitis    Anal lesion    polypoid   Atherosclerosis of aorta    Carotid artery stenosis, asymptomatic, bilateral 11/2017   last carotid ultrasound in epic 07-19-2021  right ECA >50% and left ICA 40-59% stenosis   CKD (chronic kidney disease), stage III (HCC)    followed by pcp;  previously followed by nephrologist--- dr wanetta  (per pt lov appprox 2020)   DOE (dyspnea on exertion)    10-27-2021  pt stated has normal doe due to COPD, but past couple days it has even more so, Dr Darlean started pt on prednisone  today   ED (erectile dysfunction)    GERD (gastroesophageal reflux disease)    History of adenomatous polyp of colon    Hyperplasia of prostate with lower urinary tract symptoms (LUTS)    Hypertension    followed by pcp;   nuclear stress test 07-13-2021 low risk no ischemia w/ nuclear ef 62%;  echo 07-13-2021 ef 55-60%, aortic valve w/ mild AR with mild to moderate sclerosis, RVSP 36.34mmHg   Peripheral vascular disease    Pre-diabetes    Prostate cancer [facility]) 2018   urologist-- dr sherrilee;  first dx 02/ 2018 Gleason 3+3, PSA 6.4 active survillance;  MRI fusion bx 04-21-2021, Gleason 3+4, PSA 20.5   Renal cyst, acquired    bilateral   Stage 2 moderate COPD by GOLD classification [facility])    pulmonologist--- dr darlean;  emphysema still smoking ;  pt does not have oxygen (10-27-2021  pt stated trying to quit last cigarette in 7 days, had cut done to 3-4 cig per week)   Wears dentures    full upper and partial lower   Wears glasses     Surgical History: Past Surgical History:  Procedure Laterality  Date   COLONOSCOPY WITH PROPOFOL   03/28/2021   by dr teressa   GOLD SEED IMPLANT N/A 11/30/2021   Procedure: GOLD SEED IMPLANT;  Surgeon: Sherrilee Belvie LITTIE, MD;  Location: [facility];  Service: Urology;  Laterality: N/A;   NO PAST SURGERIES     SPACE OAR INSTILLATION N/A 11/30/2021   Procedure: SPACE OAR INSTILLATION;  Surgeon: Sherrilee Belvie LITTIE, MD;  Location: [facility];  Service: Urology;  Laterality: N/A;   TRANSANAL EXCISION OF RECTAL MASS N/A 10/30/2021   Procedure: TRANSANAL EXCISION OF ANAL CANAL POLYPOID LESION;  Surgeon: Teresa Lonni HERO, MD;  Location: [facility];  Service: General;  Laterality: N/A;    Home Medications:  Allergies as of 12/02/2024       Reactions   Lisinopril -hydrochlorothiazide     dizziness        Medication List        Accurate as of December 02, 2024 11:41 AM. If you have any questions, ask your nurse or doctor.          acetaminophen  500 MG tablet Commonly known as: TYLENOL  Take 1,000 mg by mouth every 6 (six) hours as needed for moderate pain.   albuterol  108 (90 Base) MCG/ACT inhaler Commonly known as: VENTOLIN  HFA Inhale 2 puffs into the lungs  every 4 (four) hours as needed for wheezing or shortness of breath (cough, shortness of breath or wheezing.).   albuterol  108 (90 Base) MCG/ACT inhaler Commonly known as: ProAir  HFA 2 puffs every 4 hours as needed only  if your can't catch your breath   albuterol  (2.5 MG/3ML) 0.083% nebulizer solution Commonly known as: PROVENTIL  Take 3 mLs (2.5 mg total) by nebulization every 4 (four) hours as needed.   Breztri  Aerosphere 160-9-4.8 MCG/ACT Aero inhaler Generic drug: budesonide -glycopyrrolate -formoterol  Take 2 puffs first thing in am and then another 2 puffs about 12 hours later.   cetirizine  10 MG tablet Commonly known as: ZYRTEC  TAKE 1 TABLET EVERY DAY   cilostazol  100 MG tablet Commonly known as: PLETAL  Take 1 tablet (100 mg total) by mouth 2 (two) times daily before a  meal.   Dilt-XR 240 MG 24 hr capsule Generic drug: diltiazem  TAKE 1 CAPSULE EVERY DAY   olmesartan  40 MG tablet Commonly known as: BENICAR  TAKE 1 TABLET EVERY DAY   omeprazole 20 MG tablet Commonly known as: PRILOSEC OTC Take 20 mg by mouth daily as needed (acid reflux).   pentoxifylline  400 MG CR tablet Commonly known as: TRENTAL  Take 1 tablet (400 mg total) by mouth 3 (three) times daily with meals.   rosuvastatin  20 MG tablet Commonly known as: CRESTOR  TAKE 1 TABLET EVERY DAY   sildenafil  100 MG tablet Commonly known as: VIAGRA  Take 1 tablet (100 mg total) by mouth daily as needed for erectile dysfunction.   solifenacin  5 MG tablet Commonly known as: VESICARE  Take 1 tablet (5 mg total) by mouth daily.   tamsulosin  0.4 MG Caps capsule Commonly known as: FLOMAX  Take 1 capsule (0.4 mg total) by mouth in the morning and at bedtime.   triamcinolone  cream 0.1 % Commonly known as: KENALOG  APPLY TO THE AFFECTED AREA(S) TOPICALLY TWICE DAILY        Allergies:  Allergies  Allergen Reactions   Lisinopril -Hydrochlorothiazide      dizziness    Family History: Family History  Problem Relation Age of Onset   Colon cancer Neg Hx    Esophageal cancer Neg Hx    Rectal cancer Neg Hx    Stomach cancer Neg Hx    Colon polyps Neg Hx     Social History:  reports that he has been smoking cigarettes. He started smoking about 55 years ago. He has a 13.8 pack-year smoking history. He has been exposed to tobacco smoke. He has never used smokeless tobacco. He reports that he does not currently use alcohol after a past usage of about 1.0 standard drink of alcohol per week. He reports that he does not currently use drugs.  ROS: All other review of systems were reviewed and are negative except what is noted above in HPI  Physical Exam: BP (!) 122/58   Pulse 64   Constitutional:  Alert and oriented, No acute distress. HEENT: [place] AT, moist mucus membranes.  Trachea midline, no  masses. Cardiovascular: No clubbing, cyanosis, or edema. Respiratory: Normal respiratory effort, no increased work of breathing. GI: Abdomen is soft, nontender, nondistended, no abdominal masses GU: No CVA tenderness.  Lymph: No cervical or inguinal lymphadenopathy. Skin: No rashes, bruises or suspicious lesions. Neurologic: Grossly intact, no focal deficits, moving all 4 extremities. Psychiatric: Normal mood and affect.  Laboratory Data: Lab Results  Component Value Date   WBC 6.4 04/08/2024   HGB 13.9 04/08/2024   HCT 41.8 04/08/2024   MCV 85.9 04/08/2024   PLT 227.0 04/08/2024  Lab Results  Component Value Date   CREATININE 1.41 04/08/2024    Lab Results  Component Value Date   PSA 0.70 04/08/2024   PSA 5.4 (H) 09/14/2016   PSA 5.58 (H) 01/24/2016    No results found for: TESTOSTERONE  Lab Results  Component Value Date   HGBA1C 6.0 04/08/2024    Urinalysis    Component Value Date/Time   APPEARANCEUR Clear 05/22/2024 0941   GLUCOSEU Negative 05/22/2024 0941   BILIRUBINUR Negative 05/22/2024 0941   KETONESUR negative 10/17/2017 1615   PROTEINUR Negative 05/22/2024 0941   UROBILINOGEN 0.2 10/17/2017 1615   NITRITE Negative 05/22/2024 0941   LEUKOCYTESUR Negative 05/22/2024 0941    Lab Results  Component Value Date   LABMICR Comment 05/22/2024   WBCUA None seen 11/22/2023   LABEPIT 0-10 11/22/2023   MUCUS Present 06/09/2021   BACTERIA None seen 11/22/2023    Pertinent Imaging: *** No results found for this or any previous visit.  No results found for this or any previous visit.  No results found for this or any previous visit.  No results found for this or any previous visit.  Results for orders placed during the hospital encounter of 05/27/20  US  RENAL  Narrative CLINICAL DATA:  CKD stage 3  EXAM: RENAL / URINARY TRACT ULTRASOUND COMPLETE  COMPARISON:  Chest CT acquired on September 25, 2019, limited assessment of the kidneys at  that time.  FINDINGS: Right Kidney:  Renal measurements: 10.7 x 4.0 x 4.5 cm = volume: 102 mL. Small hypoechoic to anechoic focus in the upper pole of the RIGHT kidney shows some central increased echogenicity and perhaps multi septate changes.  3.9 x 3.3 x 2.7 cm anechoic cyst with adjacent smaller cystic area 1.2 x 1.3 x 1.3 cm cannot be classified as a simple cyst and does not show overt signs of flow. No hydronephrosis. Decreased corticomedullary differentiation.  Left Kidney:  Renal measurements: 10.6 x 5.7 x 4.8 cm = volume: 151 mL. Decreased corticomedullary differentiation without hydronephrosis. Small mildly complex areas with multi septate or spongiform appearance in the upper pole and interpolar portion largest measuring 1.6 x 1.4 x 1.5 cm with smaller areas, less than 10 also showing some cystic characteristics.  Bladder:  Appears normal for degree of bladder distention.  Other:  None.  IMPRESSION: 1. Decreased corticomedullary differentiation without hydronephrosis. Findings can be seen in the setting of medical renal disease. 2. Multiple cystic lesions in the bilateral kidneys without renal enlargement. Some of these areas display internal complexity/variable echogenicity. MRI may be helpful for further evaluation to exclude the presence of enhancing components/small cystic renal neoplasm.  These results will be called to the ordering clinician or representative by the Radiologist Assistant, and communication documented in the PACS or [employer].   Electronically Signed By: Isla Blind M.D. On: 05/30/2020 10:55  No results found for this or any previous visit.  No results found for this or any previous visit.  No results found for this or any previous visit.   Assessment & Plan:    1. Prostate cancer (HCC) (Primary) Followup 1 year with PSA - Urinalysis, Routine w reflex microscopic  2. Benign prostatic hyperplasia with urinary  obstruction ***  3. Urinary frequency ***   No follow-ups on file.  Belvie Clara, MD  [facility] "

## 2024-12-02 NOTE — Patient Instructions (Signed)
 Andrew [name]

## 2024-12-03 ENCOUNTER — Encounter: Payer: Self-pay | Admitting: Urology

## 2024-12-05 ENCOUNTER — Other Ambulatory Visit: Payer: Self-pay | Admitting: Urology

## 2024-12-05 DIAGNOSIS — R351 Nocturia: Secondary | ICD-10-CM

## 2025-04-08 ENCOUNTER — Ambulatory Visit

## 2025-04-12 ENCOUNTER — Encounter: Admitting: Emergency Medicine
